# Patient Record
Sex: Male | Born: 1950 | Hispanic: No | Marital: Single | Smoking: Former smoker
Health system: Southern US, Community
[De-identification: ages and names within clinical notes are randomized; demographics above are authoritative.]

## PROBLEM LIST (undated history)

## (undated) ENCOUNTER — Ambulatory Visit: Admission: EM

## (undated) DIAGNOSIS — E7800 Pure hypercholesterolemia, unspecified: Secondary | ICD-10-CM

## (undated) DIAGNOSIS — R3911 Hesitancy of micturition: Secondary | ICD-10-CM

## (undated) DIAGNOSIS — L02619 Cutaneous abscess of unspecified foot: Secondary | ICD-10-CM

## (undated) DIAGNOSIS — H269 Unspecified cataract: Secondary | ICD-10-CM

## (undated) DIAGNOSIS — D649 Anemia, unspecified: Secondary | ICD-10-CM

## (undated) DIAGNOSIS — H9190 Unspecified hearing loss, unspecified ear: Secondary | ICD-10-CM

## (undated) DIAGNOSIS — M2550 Pain in unspecified joint: Secondary | ICD-10-CM

## (undated) DIAGNOSIS — M7989 Other specified soft tissue disorders: Secondary | ICD-10-CM

## (undated) DIAGNOSIS — R51 Headache: Secondary | ICD-10-CM

## (undated) DIAGNOSIS — R109 Unspecified abdominal pain: Secondary | ICD-10-CM

## (undated) DIAGNOSIS — M25562 Pain in left knee: Secondary | ICD-10-CM

## (undated) DIAGNOSIS — M25519 Pain in unspecified shoulder: Secondary | ICD-10-CM

## (undated) DIAGNOSIS — D126 Benign neoplasm of colon, unspecified: Secondary | ICD-10-CM

## (undated) DIAGNOSIS — R609 Edema, unspecified: Secondary | ICD-10-CM

## (undated) DIAGNOSIS — E119 Type 2 diabetes mellitus without complications: Secondary | ICD-10-CM

## (undated) DIAGNOSIS — I2510 Atherosclerotic heart disease of native coronary artery without angina pectoris: Secondary | ICD-10-CM

## (undated) DIAGNOSIS — I10 Essential (primary) hypertension: Secondary | ICD-10-CM

## (undated) DIAGNOSIS — L03119 Cellulitis of unspecified part of limb: Secondary | ICD-10-CM

## (undated) DIAGNOSIS — K219 Gastro-esophageal reflux disease without esophagitis: Secondary | ICD-10-CM

## (undated) DIAGNOSIS — N401 Enlarged prostate with lower urinary tract symptoms: Secondary | ICD-10-CM

## (undated) DIAGNOSIS — R05 Cough: Secondary | ICD-10-CM

## (undated) DIAGNOSIS — R972 Elevated prostate specific antigen [PSA]: Secondary | ICD-10-CM

## (undated) DIAGNOSIS — L97519 Non-pressure chronic ulcer of other part of right foot with unspecified severity: Secondary | ICD-10-CM

## (undated) DIAGNOSIS — H547 Unspecified visual loss: Secondary | ICD-10-CM

## (undated) DIAGNOSIS — L409 Psoriasis, unspecified: Secondary | ICD-10-CM

## (undated) HISTORY — PX: CARDIAC CATHETERIZATION: SHX172

## (undated) HISTORY — DX: Gastro-esophageal reflux disease without esophagitis: K21.9

## (undated) HISTORY — DX: Cough: R05

## (undated) HISTORY — DX: Benign prostatic hyperplasia with lower urinary tract symptoms: N40.1

## (undated) HISTORY — DX: Cutaneous abscess of unspecified foot: L02.619

## (undated) HISTORY — DX: Edema, unspecified: R60.9

## (undated) HISTORY — DX: Unspecified abdominal pain: R10.9

## (undated) HISTORY — DX: Unspecified cataract: H26.9

## (undated) HISTORY — DX: Essential (primary) hypertension: I10

## (undated) HISTORY — DX: Psoriasis, unspecified: L40.9

## (undated) HISTORY — DX: Pure hypercholesterolemia, unspecified: E78.00

## (undated) HISTORY — DX: Cellulitis of unspecified part of limb: L03.119

## (undated) HISTORY — DX: Benign neoplasm of colon, unspecified: D12.6

## (undated) HISTORY — DX: Other specified soft tissue disorders: M79.89

## (undated) HISTORY — DX: Pain in left knee: M25.562

## (undated) HISTORY — DX: Pain in unspecified joint: M25.50

## (undated) HISTORY — DX: Elevated prostate specific antigen (PSA): R97.20

## (undated) HISTORY — DX: Unspecified visual loss: H54.7

## (undated) HISTORY — DX: Type 2 diabetes mellitus without complications: E11.9

## (undated) HISTORY — PX: COLONOSCOPY: SHX174

## (undated) HISTORY — DX: Hesitancy of micturition: R39.11

## (undated) HISTORY — DX: Pain in unspecified shoulder: M25.519

## (undated) HISTORY — DX: Non-pressure chronic ulcer of other part of right foot with unspecified severity: L97.519

## (undated) HISTORY — DX: Headache: R51

---

## 2001-01-23 ENCOUNTER — Encounter (INDEPENDENT_AMBULATORY_CARE_PROVIDER_SITE_OTHER): Payer: Self-pay

## 2001-01-23 ENCOUNTER — Ambulatory Visit (HOSPITAL_COMMUNITY): Admission: RE | Admit: 2001-01-23 | Discharge: 2001-01-23 | Payer: Self-pay | Admitting: Gastroenterology

## 2002-11-06 ENCOUNTER — Encounter: Payer: Self-pay | Admitting: Family Medicine

## 2002-11-06 ENCOUNTER — Encounter: Admission: RE | Admit: 2002-11-06 | Discharge: 2002-11-06 | Payer: Self-pay | Admitting: Family Medicine

## 2003-05-30 HISTORY — PX: OTHER SURGICAL HISTORY: SHX169

## 2004-08-18 ENCOUNTER — Ambulatory Visit: Payer: Self-pay | Admitting: Endocrinology

## 2004-08-25 ENCOUNTER — Ambulatory Visit: Payer: Self-pay | Admitting: Endocrinology

## 2004-10-13 ENCOUNTER — Ambulatory Visit: Payer: Self-pay | Admitting: Endocrinology

## 2004-10-23 ENCOUNTER — Ambulatory Visit: Payer: Self-pay | Admitting: Endocrinology

## 2004-10-29 ENCOUNTER — Ambulatory Visit: Payer: Self-pay | Admitting: Endocrinology

## 2004-11-11 LAB — HM COLONOSCOPY

## 2005-06-25 ENCOUNTER — Ambulatory Visit: Payer: Self-pay | Admitting: Endocrinology

## 2005-07-29 ENCOUNTER — Ambulatory Visit: Payer: Self-pay | Admitting: Endocrinology

## 2005-10-21 ENCOUNTER — Ambulatory Visit: Payer: Self-pay | Admitting: Endocrinology

## 2005-11-04 ENCOUNTER — Ambulatory Visit: Payer: Self-pay | Admitting: Endocrinology

## 2006-02-04 ENCOUNTER — Ambulatory Visit: Payer: Self-pay | Admitting: Endocrinology

## 2006-02-08 ENCOUNTER — Ambulatory Visit: Payer: Self-pay | Admitting: Internal Medicine

## 2006-10-25 ENCOUNTER — Ambulatory Visit: Payer: Self-pay | Admitting: Endocrinology

## 2007-02-03 ENCOUNTER — Ambulatory Visit: Payer: Self-pay | Admitting: Endocrinology

## 2007-02-03 LAB — CONVERTED CEMR LAB
ALT: 15 units/L (ref 0–53)
Albumin: 3.9 g/dL (ref 3.5–5.2)
Alkaline Phosphatase: 49 units/L (ref 39–117)
BUN: 13 mg/dL (ref 6–23)
Basophils Absolute: 0 10*3/uL (ref 0.0–0.1)
Bilirubin Urine: NEGATIVE
CO2: 25 meq/L (ref 19–32)
Calcium: 9.6 mg/dL (ref 8.4–10.5)
Cholesterol: 256 mg/dL (ref 0–200)
Direct LDL: 124.1 mg/dL
Eosinophils Absolute: 0.1 10*3/uL (ref 0.0–0.6)
GFR calc Af Amer: 129 mL/min
GFR calc non Af Amer: 106 mL/min
HDL: 31.5 mg/dL — ABNORMAL LOW (ref >39.0)
Hemoglobin, Urine: NEGATIVE
Hemoglobin: 13.1 g/dL (ref 13.0–17.0)
Hgb A1c MFr Bld: 8.1 % — ABNORMAL HIGH (ref 4.6–6.0)
Leukocytes, UA: NEGATIVE
Lymphocytes Relative: 36.2 % (ref 12.0–46.0)
MCHC: 34.4 g/dL (ref 30.0–36.0)
MCV: 86.9 fL (ref 78.0–100.0)
Microalb Creat Ratio: 8 mg/g (ref 0.0–30.0)
Microalb, Ur: 1.2 mg/dL (ref 0.0–1.9)
Monocytes Absolute: 0.3 10*3/uL (ref 0.2–0.7)
Monocytes Relative: 7.1 % (ref 3.0–11.0)
Neutro Abs: 2.4 10*3/uL (ref 1.4–7.7)
Platelets: 210 10*3/uL (ref 150–400)
Potassium: 4.4 meq/L (ref 3.5–5.1)
Specific Gravity, Urine: >=1.03 (ref 1.000–1.03)
Total Protein, Urine: NEGATIVE mg/dL
Total Protein: 6.8 g/dL (ref 6.0–8.3)
Triglycerides: 582 mg/dL (ref 0–149)
Urine Glucose: 100 mg/dL — AB

## 2007-02-10 ENCOUNTER — Ambulatory Visit: Payer: Self-pay | Admitting: Endocrinology

## 2007-03-04 ENCOUNTER — Encounter: Payer: Self-pay | Admitting: Endocrinology

## 2007-03-04 DIAGNOSIS — I10 Essential (primary) hypertension: Secondary | ICD-10-CM | POA: Insufficient documentation

## 2007-03-04 DIAGNOSIS — E119 Type 2 diabetes mellitus without complications: Secondary | ICD-10-CM

## 2007-03-04 HISTORY — DX: Type 2 diabetes mellitus without complications: E11.9

## 2007-03-04 HISTORY — DX: Essential (primary) hypertension: I10

## 2007-05-04 ENCOUNTER — Ambulatory Visit: Payer: Self-pay | Admitting: Endocrinology

## 2007-05-04 LAB — CONVERTED CEMR LAB
AST: 21 units/L (ref 0–37)
Albumin: 3.9 g/dL (ref 3.5–5.2)
Alkaline Phosphatase: 37 units/L — ABNORMAL LOW (ref 39–117)
Hepatitis B Surface Ag: NEGATIVE
Hgb A1c MFr Bld: 7.9 % — ABNORMAL HIGH (ref 4.6–6.0)
Total CHOL/HDL Ratio: 4
Triglycerides: 85 mg/dL (ref 0–149)
VLDL: 17 mg/dL (ref 0–40)

## 2007-05-11 ENCOUNTER — Ambulatory Visit: Payer: Self-pay | Admitting: Endocrinology

## 2007-05-24 ENCOUNTER — Telehealth (INDEPENDENT_AMBULATORY_CARE_PROVIDER_SITE_OTHER): Payer: Self-pay | Admitting: *Deleted

## 2007-08-09 ENCOUNTER — Ambulatory Visit: Payer: Self-pay | Admitting: Endocrinology

## 2008-05-13 ENCOUNTER — Ambulatory Visit: Payer: Self-pay | Admitting: Endocrinology

## 2008-05-13 ENCOUNTER — Telehealth (INDEPENDENT_AMBULATORY_CARE_PROVIDER_SITE_OTHER): Payer: Self-pay | Admitting: *Deleted

## 2008-05-15 LAB — CONVERTED CEMR LAB
ALT: 18 units/L (ref 0–53)
Albumin: 4.3 g/dL (ref 3.5–5.2)
BUN: 18 mg/dL (ref 6–23)
Bilirubin Urine: NEGATIVE
CO2: 26 meq/L (ref 19–32)
Calcium: 9.4 mg/dL (ref 8.4–10.5)
Cholesterol: 276 mg/dL (ref 0–200)
Creatinine, Ser: 1 mg/dL (ref 0.4–1.5)
Creatinine,U: 197.7 mg/dL
Crystals: NEGATIVE
Eosinophils Absolute: 0.1 10*3/uL (ref 0.0–0.7)
Eosinophils Relative: 1.6 % (ref 0.0–5.0)
GFR calc Af Amer: 99 mL/min
Glucose, Bld: 292 mg/dL — ABNORMAL HIGH (ref 70–99)
HCT: 40.4 % (ref 39.0–52.0)
Hemoglobin, Urine: NEGATIVE
Hemoglobin: 14.3 g/dL (ref 13.0–17.0)
MCV: 87.5 fL (ref 78.0–100.0)
Microalb Creat Ratio: 20.2 mg/g (ref 0.0–30.0)
Microalb, Ur: 4 mg/dL — ABNORMAL HIGH (ref 0.0–1.9)
Monocytes Absolute: 0.4 10*3/uL (ref 0.1–1.0)
Monocytes Relative: 7.6 % (ref 3.0–12.0)
Neutro Abs: 3.7 10*3/uL (ref 1.4–7.7)
PSA: 2.02 ng/mL (ref 0.10–4.00)
Platelets: 181 10*3/uL (ref 150–400)
RDW: 12.2 % (ref 11.5–14.6)
Total CHOL/HDL Ratio: 7
Total Protein, Urine: NEGATIVE mg/dL
Total Protein: 7.5 g/dL (ref 6.0–8.3)
Triglycerides: 257 mg/dL (ref 0–149)
Urine Glucose: >=1000 mg/dL — CR
Urobilinogen, UA: 0.2 (ref 0.0–1.0)

## 2008-05-30 ENCOUNTER — Telehealth: Payer: Self-pay | Admitting: Endocrinology

## 2008-07-02 ENCOUNTER — Ambulatory Visit: Payer: Self-pay

## 2008-07-02 ENCOUNTER — Ambulatory Visit: Payer: Self-pay | Admitting: Endocrinology

## 2008-07-02 DIAGNOSIS — L02619 Cutaneous abscess of unspecified foot: Secondary | ICD-10-CM

## 2008-07-02 DIAGNOSIS — R609 Edema, unspecified: Secondary | ICD-10-CM

## 2008-07-02 DIAGNOSIS — L03119 Cellulitis of unspecified part of limb: Secondary | ICD-10-CM

## 2008-07-03 ENCOUNTER — Telehealth (INDEPENDENT_AMBULATORY_CARE_PROVIDER_SITE_OTHER): Payer: Self-pay | Admitting: *Deleted

## 2008-07-03 ENCOUNTER — Ambulatory Visit: Payer: Self-pay | Admitting: Endocrinology

## 2008-07-29 ENCOUNTER — Ambulatory Visit: Payer: Self-pay | Admitting: Endocrinology

## 2008-07-29 DIAGNOSIS — E7800 Pure hypercholesterolemia, unspecified: Secondary | ICD-10-CM | POA: Insufficient documentation

## 2008-07-29 DIAGNOSIS — D126 Benign neoplasm of colon, unspecified: Secondary | ICD-10-CM

## 2008-07-29 HISTORY — DX: Pure hypercholesterolemia, unspecified: E78.00

## 2008-07-29 HISTORY — DX: Benign neoplasm of colon, unspecified: D12.6

## 2008-08-20 ENCOUNTER — Telehealth: Payer: Self-pay | Admitting: Endocrinology

## 2009-12-04 ENCOUNTER — Telehealth: Payer: Self-pay | Admitting: Endocrinology

## 2009-12-04 ENCOUNTER — Ambulatory Visit: Payer: Self-pay | Admitting: Endocrinology

## 2009-12-04 LAB — CONVERTED CEMR LAB
Bilirubin, Direct: 0.1 mg/dL (ref 0.0–0.3)
CO2: 28 meq/L (ref 19–32)
Calcium: 10 mg/dL (ref 8.4–10.5)
Creatinine, Ser: 0.8 mg/dL (ref 0.4–1.5)
Total Bilirubin: 0.4 mg/dL (ref 0.3–1.2)
Total Protein: 7.6 g/dL (ref 6.0–8.3)

## 2009-12-05 ENCOUNTER — Telehealth: Payer: Self-pay | Admitting: Endocrinology

## 2009-12-09 ENCOUNTER — Telehealth: Payer: Self-pay | Admitting: Endocrinology

## 2009-12-31 ENCOUNTER — Telehealth (INDEPENDENT_AMBULATORY_CARE_PROVIDER_SITE_OTHER): Payer: Self-pay | Admitting: *Deleted

## 2010-01-12 ENCOUNTER — Ambulatory Visit: Payer: Self-pay | Admitting: Endocrinology

## 2010-01-12 DIAGNOSIS — M25519 Pain in unspecified shoulder: Secondary | ICD-10-CM

## 2010-01-12 HISTORY — DX: Pain in unspecified shoulder: M25.519

## 2010-01-12 LAB — CONVERTED CEMR LAB
ALT: 17 units/L (ref 0–53)
Albumin: 4.2 g/dL (ref 3.5–5.2)
BUN: 22 mg/dL (ref 6–23)
CO2: 26 meq/L (ref 19–32)
Calcium: 9.5 mg/dL (ref 8.4–10.5)
Creatinine, Ser: 0.7 mg/dL (ref 0.4–1.5)
Direct LDL: 128.7 mg/dL
HDL: 41 mg/dL (ref >39.00)
TSH: 0.56 microintl units/mL (ref 0.35–5.50)
Total Bilirubin: 0.6 mg/dL (ref 0.3–1.2)
Total Protein: 7.3 g/dL (ref 6.0–8.3)
Triglycerides: 518 mg/dL — ABNORMAL HIGH (ref 0.0–149.0)

## 2010-02-10 ENCOUNTER — Ambulatory Visit: Payer: Self-pay | Admitting: Endocrinology

## 2010-02-11 ENCOUNTER — Telehealth: Payer: Self-pay | Admitting: Endocrinology

## 2010-03-16 ENCOUNTER — Ambulatory Visit: Payer: Self-pay | Admitting: Endocrinology

## 2010-03-31 ENCOUNTER — Telehealth: Payer: Self-pay | Admitting: Endocrinology

## 2010-04-15 ENCOUNTER — Ambulatory Visit: Payer: Self-pay | Admitting: Endocrinology

## 2010-04-24 ENCOUNTER — Emergency Department (HOSPITAL_COMMUNITY): Admission: EM | Admit: 2010-04-24 | Discharge: 2010-04-24 | Payer: Self-pay | Admitting: Emergency Medicine

## 2010-05-15 ENCOUNTER — Ambulatory Visit: Payer: Self-pay | Admitting: Endocrinology

## 2010-05-21 ENCOUNTER — Encounter
Admission: RE | Admit: 2010-05-21 | Discharge: 2010-07-15 | Payer: Self-pay | Source: Home / Self Care | Attending: Orthopedic Surgery | Admitting: Orthopedic Surgery

## 2010-07-10 ENCOUNTER — Ambulatory Visit: Payer: Self-pay | Admitting: Endocrinology

## 2010-08-23 LAB — CONVERTED CEMR LAB
Alkaline Phosphatase: 61 units/L (ref 39–117)
Basophils Absolute: 0 10*3/uL (ref 0.0–0.1)
Bilirubin, Direct: 0.1 mg/dL (ref 0.0–0.3)
Crystals: NEGATIVE
Eosinophils Absolute: 0.1 10*3/uL (ref 0.0–0.7)
Eosinophils Relative: 1.6 % (ref 0.0–5.0)
GFR calc Af Amer: 149 mL/min
GFR calc non Af Amer: 123 mL/min
HDL: 44.2 mg/dL (ref >39.0)
Hgb A1c MFr Bld: 9.1 % — ABNORMAL HIGH (ref 4.6–6.0)
Leukocytes, UA: NEGATIVE
Lymphocytes Relative: 37.4 % (ref 12.0–46.0)
MCV: 85.9 fL (ref 78.0–100.0)
Microalb Creat Ratio: 15.3 mg/g (ref 0.0–30.0)
Neutrophils Relative %: 54.1 % (ref 43.0–77.0)
PSA: 3.41 ng/mL (ref 0.10–4.00)
Platelets: 192 10*3/uL (ref 150–400)
Potassium: 4.7 meq/L (ref 3.5–5.1)
Sodium: 139 meq/L (ref 135–145)
Specific Gravity, Urine: >=1.03 (ref 1.000–1.03)
TSH: 1.13 microintl units/mL (ref 0.35–5.50)
Total Bilirubin: 0.6 mg/dL (ref 0.3–1.2)
Total CHOL/HDL Ratio: 6
Triglycerides: 191 mg/dL — ABNORMAL HIGH (ref 0–149)
Urine Glucose: 250 mg/dL — AB
Urobilinogen, UA: 0.2 (ref 0.0–1.0)
VLDL: 38 mg/dL (ref 0–40)
WBC: 4.8 10*3/uL (ref 4.5–10.5)
pH: 5 (ref 5.0–8.0)

## 2010-08-27 NOTE — Assessment & Plan Note (Signed)
Summary: 1 MTH FU---STC   Vital Signs:  Patient profile:   60 year old male Height:      71 inches (180.34 cm) Weight:      211.13 pounds (95.97 kg) BMI:     29.55 O2 Sat:      95 % on Room air Temp:     98.5 degrees F (36.94 degrees C) oral Pulse rate:   85 / minute BP sitting:   124 / 72  (left arm) Cuff size:   regular  Vitals Entered By: Brenton Grills MA (April 15, 2010 8:24 AM)  O2 Flow:  Room air CC: 1 month F/U/aj Is Patient Diabetic? Yes   CC:  1 month F/U/aj.  History of Present Illness: no cbg record, but states cbg's vary from 102-450.  it is in general lowest in the afternoon, but he can't be certain.    Current Medications (verified): 1)  Aspirin 325 Mg  Tbec (Aspirin) .... Take 1 By Mouth Qd 2)  Lotrisone 1-0.05 % Crea (Clotrimazole-Betamethasone) .... Bid 3)  Simvastatin 80 Mg Tabs (Simvastatin) .... At Bedtime 4)  Humulin 70/30 70-30 % Susp (Insulin Isophane & Regular) .... 90 Units Each Am, and 30 Units With The Evening Meal. 5)  Tramadol Hcl 50 Mg Tabs (Tramadol Hcl) .[name].. 1 Wvery 4 Hrs As Needed For Pain 6)  Lisinopril 20 Mg Tabs (Lisinopril) .[name].. 1 Once Daily  Allergies (verified): No Known Drug Allergies  Past History:  Past Medical History: Last updated: 03/04/2007 Diabetes mellitus, type II Hypertension Psoriasis Smoker (Quit 1990) Dyslipidemia  Review of Systems  The patient denies syncope.    Physical Exam  General:  obese.  no distress  Pulses:  dorsalis pedis intact bilat.   Extremities:  no deformity.  no ulcer on the feet.  feet are of normal color and temp.  no edema mycotic toenails.   Neurologic:  sensation is intact to touch on the feet    Impression & Recommendations:  Problem # 1:  DIABETES MELLITUS, TYPE II (ICD-250.00) i need more cbg info in order to safely adjust insulin therapy limited by pt's request for least expensive meds therapy limited by noncompliance with cbg recording.  i'll do the best i  can.  Other Orders: Est. Patient Level II ([address]109)  Patient Instructions: 1)  for now, continue humulin ([facility]) 70/30 insulin, to 90 units with breakfast and 30 units with the evening meal.   2)  Please schedule a follow-up appointment in 1 month. 3)   check your blood sugar 1 time a day.  vary the time of day when you check, between before the 3 meals, and at bedtime.  also check if you have symptoms of your blood sugar being too high or too low.  please keep a record of the readings and bring it to your next appointment here.  please call us sooner if you are having low blood sugar episodes.

## 2010-08-27 NOTE — Progress Notes (Signed)
Summary: elevated CBGs  Phone Note Call from Patient Call back at Home Phone [phone]   Caller: Daughter Lockie Pares Summary of Call: pt's daughter called stating that pt's CBGs have still been very elevated at 340-500+. Pt's CBG this morning was 400+. Pt's daughter report dietary compliance, she also states pt has had a severe HA x 3 days. Pt and daughter are very concerned about pt's blood sugars and are requesting insulin adjustment and advise on how to better control sugars. Initial call taken by: Margaret Pyle, CMA,  Dec 09, 2009 9:03 AM  Follow-up for Phone Call        increase insulin to 60 units each am.  you can take an extra 20 units today, for a total of 60. Follow-up by: Minus Breeding MD,  Dec 09, 2009 9:09 AM  Additional Follow-up for Phone Call Additional follow up Details #1::        Pt's daughter informed of increase Additional Follow-up by: Margaret Pyle, CMA,  Dec 09, 2009 9:15 AM    New/Updated Medications: HUMULIN 70/30 70-30 % SUSP (INSULIN ISOPHANE & REGULAR) 60 units each am

## 2010-08-27 NOTE — Progress Notes (Signed)
Summary: elevated CBGS  Phone Note Call from Patient Call back at Home Phone [phone]   Caller:  Daughter Lockie Pares  Summary of Call: Patient daughter called stating that pt took 10 units of insulin last night and another 15units this am due to high CBG. Daughter says that CBG are so high that monitor will not register. She is very concerned and requesting MD advisment on how to get it lower. OK to lm on her vm or can call pt at [phone] Initial call taken by: Rock Nephew CMA,  Dec 05, 2009 2:47 PM  Follow-up for Phone Call        increase to 40 units each am.  ok to take 10 extra today. Follow-up by: Minus Breeding MD,  Dec 05, 2009 2:56 PM  Additional Follow-up for Phone Call Additional follow up Details #1::        Called daughter back did not ansew. Left msg on vm md recommendations. Allso tried to call pt but could not leave msg vm not set-up Additional Follow-up by: Orlan Leavens,  Dec 05, 2009 3:15 PM    New/Updated Medications: HUMULIN 70/30 70-30 % SUSP (INSULIN ISOPHANE & REGULAR) 40 units each am

## 2010-08-27 NOTE — Assessment & Plan Note (Signed)
Summary: elev sugar   Vital Signs:  Patient profile:   60 year old male Height:      71 inches (180.34 cm) Weight:      194 pounds (88.18 kg) BMI:     27.16 O2 Sat:      95 % on Room air Temp:     98.9 degrees F (37.17 degrees C) oral Pulse rate:   82 / minute BP sitting:   140 / 80  (left arm) Cuff size:   regular  Vitals Entered By: Josph Macho RMA (Dec 04, 2009 1:34 PM)  O2 Flow:  Room air CC: Elevated Blood Sugar- BS been running 550 or higher X7 days- this morning at 11:00 it was 350/ pt states he is not taking Lotrisone, Simvastatin, or Lisinopril/ CF Is Patient Diabetic? Yes   CC:  Elevated Blood Sugar- BS been running 550 or higher X7 days- this morning at 11:00 it was 350/ pt states he is not taking Lotrisone, Simvastatin, and or Lisinopril/ CF.  History of Present Illness: here with dtr today, who says 1 week ago, cbg's increased to 500.  he has 1 week of polydipsia, but no numbness of the feet.  denies loc.  he is still on all 3 dm meds. he stopped zestril.  he has a slight headahce. he stopped zocor.  he says his diet is "not good." he lost his health insurance, and wants to minimize his expenditures.  Current Medications (verified): 1)  Aspirin 325 Mg  Tbec (Aspirin) .... Take 1 By Mouth Qd 2)  Actos 45 Mg  Tabs (Pioglitazone Hcl) .... Take 1 By Mouth Qd 3)  Glyburide 5 Mg Tabs (Glyburide) .... Take 1 Tablet By Mouth Twice A Day 4)  Januvia 100 Mg  Tabs (Sitagliptin Phosphate) .... Take 1 By Mouth Qd 5)  Lotrisone 1-0.05 % Crea (Clotrimazole-Betamethasone) .... Bid 6)  Metformin Hcl 500 Mg Tabs (Metformin Hcl) .... Take 1 Two Times A Day 7)  Lisinopril-Hydrochlorothiazide 10-12.5 Mg Tabs (Lisinopril-Hydrochlorothiazide) .... 1/2 Qd 8)  Simvastatin 80 Mg Tabs (Simvastatin) .... Qhs  Allergies (verified): No Known Drug Allergies  Past History:  Past Medical History: Last updated: 03/04/2007 Diabetes mellitus, type II Hypertension Psoriasis Smoker (Quit  1990) Dyslipidemia  Review of Systems  The patient denies dyspnea on exertion.         denies n/v  Physical Exam  General:  normal appearance.   Msk:  gait is normal and steady Pulses:  dorsalis pedis intact bilat.   Extremities:  no deformity.  no ulcer on the feet.  feet are of normal color and temp.  no edema  Neurologic:  sensation is intact to touch on the feet    Impression & Recommendations:  Problem # 1:  DIABETES MELLITUS, TYPE II (ICD-250.00) therapy limited by pt's request for least expensive meds  Problem # 2:  HYPERTENSION (ICD-401.9) therapy limited by pt's request for least expensive meds  Problem # 3:  HYPERCHOLESTEROLEMIA (ICD-272.0) therapy limited by pt's request for least expensive meds  Medications Added to Medication List This Visit: 1)  Lisinopril-hydrochlorothiazide 10-12.5 Mg Tabs (Lisinopril-hydrochlorothiazide) .... 1/2 once daily 2)  Simvastatin 80 Mg Tabs (Simvastatin) .... At bedtime 3)  Humulin 70/30 70-30 % Susp (Insulin isophane & regular) .[name].. 10 units each am  Other Orders: TLB-BMP (Basic Metabolic Panel-BMET) (80048-METABOL) TLB-Hepatic/Liver Function Pnl (80076-HEPATIC) Est. Patient Level II (24401)  Patient Instructions: 1)  stop actos, metformin, glyburide, and januvia. 2)  start humulin ([facility]) 70/30 insulin, at  10 units each am.  every few days, increase by 5 units, until blood sugar at some time of day is in the 100's. 3)  Please schedule a follow-up appointment in 1 month. 4)  resume lisinopril-hctz.  i [facility] a [facility] to [facility]. 5)  resume simvastatin 80 mg at bedtime.  i sent this to [facility]. Prescriptions: SIMVASTATIN 80 MG TABS (SIMVASTATIN) at bedtime  #90 x 3   Entered and Authorized by:   Minus Breeding MD   Signed by:   Minus Breeding MD on 12/04/2009   Method used:   Electronically to        [facility] [address] (retail)       [address]       [place], [place]  [address]       Ph: [phone] or [phone]       Fax: [phone]   RxID:   [id number] LISINOPRIL-HYDROCHLOROTHIAZIDE 10-12.5 MG TABS (LISINOPRIL-HYDROCHLOROTHIAZIDE) 1/2 once daily  #30 x 11   Entered and Authorized by:   Minus Breeding MD   Signed by:   Minus Breeding MD on 12/04/2009   Method used:   Electronically to        [facility] (retail)       [address]       [place], [place]  [address]       Ph: [phone]       Fax: [phone]   RxID:   [id number] HUMULIN 70/30 70-30 % SUSP (INSULIN ISOPHANE & REGULAR) 10 units each am  #1 vial x 11   Entered and Authorized by:   Minus Breeding MD   Signed by:   Minus Breeding MD on 12/04/2009   Method used:   Electronically to        [facility] (retail)       [address]       [place], [place]  [address]       Ph: [phone]       Fax: [phone]   RxID:   [id number]

## 2010-08-27 NOTE — Assessment & Plan Note (Signed)
Summary: f/u appt/#/cd   Vital Signs:  Patient profile:   60 year old male Height:      71 inches (180.34 cm) Weight:      213.75 pounds (97.16 kg) BMI:     29.92 O2 Sat:      95 % on Room air Temp:     98.3 degrees F (36.83 degrees C) oral Pulse rate:   71 / minute BP sitting:   122 / 76  (left arm) Cuff size:   regular  Vitals Entered By: Brenton Grills MA (May 15, 2010 8:34 AM)  O2 Flow:  Room air CC: Follow-up visit/aj Is Patient Diabetic? Yes   CC:  Follow-up visit/aj.  History of Present Illness: he brings a record of his cbg's which i have reviewed today.  it varies from 200-300.   right hand pain persists.  tramadol does not help.  vicodin given in er helped.    Current Medications (verified): 1)  Aspirin 325 Mg  Tbec (Aspirin) .... Take 1 By Mouth Qd 2)  Lotrisone 1-0.05 % Crea (Clotrimazole-Betamethasone) .... Bid 3)  Simvastatin 80 Mg Tabs (Simvastatin) .... At Bedtime 4)  Humulin 70/30 70-30 % Susp (Insulin Isophane & Regular) .... 90 Units Each Am, and 30 Units With The Evening Meal. 5)  Tramadol Hcl 50 Mg Tabs (Tramadol Hcl) .[name].. 1 Wvery 4 Hrs As Needed For Pain 6)  Lisinopril 20 Mg Tabs (Lisinopril) .[name].. 1 Once Daily  Allergies (verified): No Known Drug Allergies  Past History:  Past Medical History: Last updated: 03/04/2007 Diabetes mellitus, type II Hypertension Psoriasis Smoker (Quit 1990) Dyslipidemia  Review of Systems  The patient denies hypoglycemia.    Physical Exam  General:  normal appearance.   Msk:  right hand swelling is unchanged Psych:  Alert and cooperative; normal mood and affect; normal attention span and concentration.     Impression & Recommendations:  Problem # 1:  DIABETES MELLITUS, TYPE II (ICD-250.00) needs increased rx  Problem # 2:  right hand pain  Medications Added to Medication List This Visit: 1)  Humulin 70/30 70-30 % Susp (Insulin isophane & regular) .[name].. 100 units each am, and 40 units with the  evening meal. 2)  Vicodin 5-500 Mg Tabs (Hydrocodone-acetaminophen) .[name].. 1 tab every 4 hrs as needed for pain  Other Orders: Admin 1st Vaccine (91478) Flu Vaccine 67yrs + (29562) TLB-Lipid Panel (80061-LIPID) TLB-A1C / Hgb A1C (Glycohemoglobin) (83036-A1C) Est. Patient Level II (13086)  Patient Instructions: 1)  increase humulin ([facility]) 70/30 insulin to, to 100 units with breakfast and 40 units with the evening meal.   2)  Please schedule a follow-up appointment in 2 months. 3)  check your blood sugar 1 time a day.  vary the time of day when you check, between before the 3 meals, and at bedtime.  also check if you have symptoms of your blood sugar being too high or too low.  please keep a record of the readings and bring it to your next appointment here.  please call us sooner if you are having low blood sugar episodes. 4)  here is a prescription for vicodin, for the hand pain.  please see the specialist as scheduled.   Prescriptions: VICODIN 5-500 MG TABS (HYDROCODONE-ACETAMINOPHEN) 1 tab every 4 hrs as needed for pain  #50 x 2   Entered and Authorized by:   Minus Breeding MD   Signed by:   Minus Breeding MD on 05/15/2010   Method used:   Print  then Give to Patient   RxID:   [id number]    Orders Added: 1)  Admin 1st Vaccine [90471] 2)  Flu Vaccine 20yrs + [56213] 3)  TLB-Lipid Panel [80061-LIPID] 4)  TLB-A1C / Hgb A1C (Glycohemoglobin) [83036-A1C] 5)  Est. Patient Level II [08657]     Flu Vaccine Consent Questions     Do you have a history of severe allergic reactions to this vaccine? no    Any prior history of allergic reactions to egg and/or gelatin? no    Do you have a sensitivity to the preservative Thimersol? no    Do you have a past history of Guillan-Barre Syndrome? no    Do you currently have an acute febrile illness? no    Have you ever had a severe reaction to latex? no    Vaccine information given and explained to patient? yes    Are you currently  pregnant? no    Lot Number:[id number]   Exp Date:01/23/2011   Site Given  Left Deltoid [id number]

## 2010-08-27 NOTE — Progress Notes (Signed)
Summary: Insulin?  Phone Note Call from Patient Call back at Home Phone [phone]   Caller: Daughter Lockie Pares  Summary of Call: Pt's daughter called to ask MD if pt should start his new Insulin today because his CBGs are elevated and then continue with regular dosage starting tomorrow morning? Pt did have his morning dosage of oral DM medications today. Initial call taken by: Margaret Pyle, CMA,  Dec 04, 2009 3:34 PM  Follow-up for Phone Call        that would be fine Follow-up by: Minus Breeding MD,  Dec 04, 2009 3:40 PM  Additional Follow-up for Phone Call Additional follow up Details #1::        pt's daughter informed Additional Follow-up by: Margaret Pyle, CMA,  Dec 04, 2009 3:57 PM

## 2010-08-27 NOTE — Assessment & Plan Note (Signed)
Summary: 2 month follow up-lb   Vital Signs:  Patient profile:   60 year old male Height:      71 inches (180.34 cm) Weight:      207.50 pounds (94.32 kg) BMI:     29.04 O2 Sat:      96 % on Room air Temp:     97.1 degrees F (36.17 degrees C) oral Pulse rate:   72 / minute BP sitting:   130 / 74  (left arm) Cuff size:   regular  Vitals Entered By: Brenton Grills MA (March 16, 2010 7:57 AM)  O2 Flow:  Room air CC: 2 month F/U/aj Is Patient Diabetic? Yes   CC:  2 month F/U/aj.  History of Present Illness: pt states he feels well in general.  no cbg record, but states cbg's vary from 250-350.  it is in general higher later in the day.   pt says right hand swelling persists.  no assoc numbness or weakness  Current Medications (verified): 1)  Aspirin 325 Mg  Tbec (Aspirin) .... Take 1 By Mouth Qd 2)  Lotrisone 1-0.05 % Crea (Clotrimazole-Betamethasone) .... Bid 3)  Simvastatin 80 Mg Tabs (Simvastatin) .... At Bedtime 4)  Humulin 70/30 70-30 % Susp (Insulin Isophane & Regular) .... 70 Units Each Am, and 20 Units With The Evening Meal. 5)  Tramadol Hcl 50 Mg Tabs (Tramadol Hcl) .[name].. 1 Wvery 4 Hrs As Needed For Pain 6)  Lisinopril 20 Mg Tabs (Lisinopril) .[name].. 1 Once Daily 7)  Ondansetron Hcl 4 Mg Tabs (Ondansetron Hcl) .[name].. 1 Tab Every 4 Hrs As Needed For Nausea  Allergies (verified): No Known Drug Allergies  Past History:  Past Medical History: Last updated: 03/04/2007 Diabetes mellitus, type II Hypertension Psoriasis Smoker (Quit 1990) Dyslipidemia  Review of Systems  The patient denies hypoglycemia.         no pain at the right hand  Physical Exam  General:  normal appearance.   Extremities:  right hand:  slightly more swollen than the left.  otherwise normal   Impression & Recommendations:  Problem # 1:  DIABETES MELLITUS, TYPE II (ICD-250.00) needs increased rx  Problem # 2:  hand swelling uncertain etiology  Problem # 3:  HYPERTENSION  (ICD-401.9)  Medications Added to Medication List This Visit: 1)  Humulin 70/30 70-30 % Susp (Insulin isophane & regular) .... 90 units each am, and 30 units with the evening meal.  Other Orders: Est. Patient Level III (95621)  Patient Instructions: 1)  increase humulin ([facility]) 70/30 insulin, to 90 units with breakfast and 30 units with the evening meal.   2)  Please schedule a follow-up appointment in 1 month. 3)  continue lisinopril 20 mg once daily. 4)  it would be expensive to investigate your right hand swelling.  however, i am happy to refer you to a hand specialist if you wish.

## 2010-08-27 NOTE — Progress Notes (Signed)
  Phone Note Other Incoming   Request: Send information Summary of Call: Request for records received from [facility]. Request forwarded to [facility].     

## 2010-08-27 NOTE — Assessment & Plan Note (Signed)
Summary: 1 mth fu  stc   Vital Signs:  Patient profile:   60 year old male Weight:      205 pounds BMI:     28.70 Temp:     97.9 degrees F Pulse rate:   76 / minute BP sitting:   158 / 78  (left arm)  Vitals Entered By: Lamar Sprinkles, CMA (January 12, 2010 9:20 AM) CC: F/u, cbgs have decreased slightly but continues to see elevated numbers. C/o pain & swelling in right hand. Continued migraines, only slight improvment since last ov.  Comments Needs RF of  Lotrisone cream   CC:  F/u, cbgs have decreased slightly but continues to see elevated numbers. C/o pain & swelling in right hand. Continued migraines, and only slight improvment since last ov. .  History of Present Illness: despite increasing his insulin to 60 units each am, cbg's are still 200-300.  he says it is in general higher as the day goes on.   pt has few mos of pain at the left shoulder > right, and associated swelling at the right hand.    Current Medications (verified): 1)  Aspirin 325 Mg  Tbec (Aspirin) .... Take 1 By Mouth Qd 2)  Lotrisone 1-0.05 % Crea (Clotrimazole-Betamethasone) .... Bid 3)  Lisinopril-Hydrochlorothiazide 10-12.5 Mg Tabs (Lisinopril-Hydrochlorothiazide) .... 1/2 Once Daily 4)  Simvastatin 80 Mg Tabs (Simvastatin) .... At Bedtime 5)  Humulin 70/30 70-30 % Susp (Insulin Isophane & Regular) .... 60 Units Each Am  Allergies (verified): No Known Drug Allergies  Past History:  Past Medical History: Last updated: 03/04/2007 Diabetes mellitus, type II Hypertension Psoriasis Smoker (Quit 1990) Dyslipidemia  Review of Systems  The patient denies hypoglycemia.         slight numbness at the left arm.  Physical Exam  General:  normal appearance.   Extremities:  full rom of the ue's, but rom is painful.both hands are slightly swollen Additional Exam:  Cholesterol LDL - Direct             128.7 mg/dL   Impression & Recommendations:  Problem # 1:  DIABETES MELLITUS, TYPE II  (ICD-250.00) needs increased rx  Problem # 2:  SHOULDER PAIN, BILATERAL (ICD-719.41) uncertain etiology  Problem # 3:  HYPERCHOLESTEROLEMIA (ICD-272.0) needs increased rx  Medications Added to Medication List This Visit: 1)  Humulin 70/30 70-30 % Susp (Insulin isophane & regular) .... 80 units each am 2)  Tramadol Hcl 50 Mg Tabs (Tramadol hcl) .Marland KitchenMarland KitchenMarland Kitchen 1 wvery 4 hrs as needed for pain  Other Orders: TLB-Lipid Panel (80061-LIPID) TLB-TSH (Thyroid Stimulating Hormone) (84443-TSH) TLB-BMP (Basic Metabolic Panel-BMET) (80048-METABOL) TLB-Sedimentation Rate (ESR) (85652-ESR) TLB-Hepatic/Liver Function Pnl (80076-HEPATIC) Est. Patient Level III (60454)  Patient Instructions: 1)  increase humulin ([facility]) 70/30 insulin, to 80 units each am.  every few days, increase by 5 units, until blood sugar at some time of day is in the low-100's. 2)  Please schedule a follow-up appointment in 2 months. 3)  tramadol 50 mg every 4 hrs as needed for pain 4)  blood tests are being ordered for you today.  please call [phone] to hear your test results. 5)  you should see an orthopedic specialist for this pain.  let me know if you wish to be referred. 6)  (update: i left message on phone-tree:  take zocor 80/d). Prescriptions: TRAMADOL HCL 50 MG TABS (TRAMADOL HCL) 1 wvery 4 hrs as needed for pain  #50 x 2   Entered and Authorized by:   Gregary Signs  Ardeen Garland MD   Signed by:   Minus Breeding MD on 01/12/2010   Method used:   Electronically to        [facility] (retail)       [address]       [place], [place]  [address]       Ph: [phone]       Fax: [phone]   RxID:   [id number]

## 2010-08-27 NOTE — Progress Notes (Signed)
Summary: Call Report  Phone Note Other Incoming   Caller: [facility] Call Report Summary of Call: [facility] Triage Call Report Triage Record Num: [id number] Operator: Remonia Richter Patient Name: Scott Donaldson Call Date & Time: 12/03/2009 6:50:24PM Patient Phone: [phone] PCP: Romero Belling Patient Gender: Male PCP Fax : [phone] Patient DOB: [birth year] Practice Name: [facility] Reason for Call: Daughter.Jaclyn : blood sugar at 540 all day and refuses to go to ER, asking what else she can do until am, dizzy and feeling poorly, ER disposition obtained and asked to encourage him to go for eval per diabetes guideline Protocol(s) Used: Diabetes: Out of Control Recommended Outcome per Protocol: See ED Immediately Reason for Outcome: Blood sugar more than 300 mg/dl AND signs/symptoms of ketoacidosis Care Advice:  ~ Another adult should drive. Write down provider's name. List or place the following in a bag for transport with the patient: current prescription and/or OTC medications; alternative treatments, therapies and medications; and street drugs.  ~  ~ If available, bring recent log of blood sugars or bring blood glucose monitor with log of blood sugars.  ~ Call EMS 911 if difficulty breathing, loses consciousness, confusion or fast pulse rate develops. Dehydration can affect blood sugar levels. Drink water during transport and while waiting to see a provider. If vomiting, take sips of water or suck on ice chips.  ~  ~ When sitting, use a chair with arms to help protect from falling.  ~ IMMEDIATE ACTION  ~ CAUTIONS 05/ Initial call taken by: Margaret Pyle, CMA,  Dec 04, 2009 8:14 AM  Follow-up for Phone Call        ov today please Follow-up by: Minus Breeding MD,  Dec 04, 2009 8:53 AM  Additional Follow-up for Phone Call Additional follow up Details #1::        per pt's daughter appt has been scheduled Additional Follow-up by: Margaret Pyle, CMA,  Dec 04, 2009 9:29 AM

## 2010-08-27 NOTE — Assessment & Plan Note (Signed)
Summary: Scott Donaldson, NAUSEA, HEADACHE, CAN'T SIT UP/NWS  #   Vital Signs:  Patient profile:   60 year old male Height:      71 inches (180.34 cm) Weight:      203 pounds (92.27 kg) BMI:     28.42 O2 Sat:      96 % on Room air Temp:     98.2 degrees F (36.78 degrees C) oral Pulse rate:   85 / minute BP sitting:   148 / 82  (right arm) Cuff size:   regular  Vitals Entered By: Brenton Grills MA (February 10, 2010 4:05 PM)  O2 Flow:  Room air CC: Pt c/o HA, chills and sweating, nausea, vomiting/Pt needs refills on Lotrisone Cream and maybe Humulin/aj Is Patient Diabetic? Yes   CC:  Pt c/o HA, chills and sweating, nausea, and vomiting/Pt needs refills on Lotrisone Cream and maybe Humulin/aj.  History of Present Illness: pt states 5 days of intermittent diaphoresis throughout the body, and associated n/v. sxs were worse when cbg went down to 120. dtr says she does not think he has an illness.  she thinks it is due to dm or ultram. no cbg record, but states cbg's vary from 124-300. it is in general higher in am than later in the day. dtr says it is hard for her to split the zestoretic.    Current Medications (verified): 1)  Aspirin 325 Mg  Tbec (Aspirin) .... Take 1 By Mouth Qd 2)  Lotrisone 1-0.05 % Crea (Clotrimazole-Betamethasone) .... Bid 3)  Lisinopril-Hydrochlorothiazide 10-12.5 Mg Tabs (Lisinopril-Hydrochlorothiazide) .... 1/2 Once Daily 4)  Simvastatin 80 Mg Tabs (Simvastatin) .... At Bedtime 5)  Humulin 70/30 70-30 % Susp (Insulin Isophane & Regular) .... 80 Units Each Am 6)  Tramadol Hcl 50 Mg Tabs (Tramadol Hcl) .[name].. 1 Wvery 4 Hrs As Needed For Pain  Allergies (verified): No Known Drug Allergies  Past History:  Past Medical History: Last updated: 03/04/2007 Diabetes mellitus, type II Hypertension Psoriasis Smoker (Quit 1990) Dyslipidemia  Review of Systems  The patient denies fever and abdominal pain.    Physical Exam  General:  normal appearance.   Abdomen:   abdomen is soft, nontender.  no hepatosplenomegaly.   not distended.  no hernia    Impression & Recommendations:  Problem # 1:  DIABETES MELLITUS, TYPE II (ICD-250.00) needs increased rx  Problem # 2:  HYPERTENSION (ICD-401.9) pt will need to take an entire tab for this.  Problem # 3:  nausea uncertain etiology  Medications Added to Medication List This Visit: 1)  Humulin 70/30 70-30 % Susp (Insulin isophane & regular) .... 70 units each am, and 20 units with the evening meal. 2)  Lisinopril 20 Mg Tabs (Lisinopril) .[name].. 1 once daily 3)  Zofran Odt 4 Mg Tbdp (Ondansetron) .[name].. 1 every 4 hrs as needed for nausea  Other Orders: Est. Patient Level III ([address])  Patient Instructions: 1)  increase humulin ([facility]) 70/30 insulin, to 70 units with breakfast and 20 units with the evening meal.   2)  Please schedule a follow-up appointment in 2 months. 3)  change lisinopril-hctz to plain lisinopril 20 mg once daily. 4)  continue prilosec 20 mg once daily 5)  ondansetron 4 mg every 4 hrs as needed for nausea. 6)  despite your lack of health insurance, you should have further testing of these symptoms.  let me know if you decide to do the tests. 7)  please return next month as scheduled. Prescriptions: ZOFRAN ODT 4  MG TBDP (ONDANSETRON) 1 every 4 hrs as needed for nausea  #36 x 2   Entered and Authorized by:   Minus Breeding MD   Signed by:   Minus Breeding MD on 02/10/2010   Method used:   Electronically to        [facility] [address] (retail)       [address]       [place], [place]  [address]       Ph: [phone] or [phone]       Fax: [phone]   RxID:   [id number] LOTRISONE 1-0.05 % CREA (CLOTRIMAZOLE-BETAMETHASONE) bid  #1 med tube x 2   Entered and Authorized by:   Minus Breeding MD   Signed by:   Minus Breeding MD on 02/10/2010   Method used:   Electronically to        [facility] (retail)       [address]       [place], [place]  [address]       Ph: [phone]       Fax: [phone]   RxID:   [id number] ZOFRAN ODT 4 MG TBDP (ONDANSETRON) 1 every 4 hrs as needed for nausea  #36 x 2   Entered and Authorized by:   Minus Breeding MD   Signed by:   Minus Breeding MD on 02/10/2010   Method used:   Electronically to        [facility] (retail)       [address]       [place], [place]  [address]       Ph: [phone]       Fax: [phone]   RxID:   [id number] LISINOPRIL 20 MG TABS (LISINOPRIL) 1 once daily  #30 x 11   Entered and Authorized by:   Minus Breeding MD   Signed by:   Minus Breeding MD on 02/10/2010   Method used:   Electronically to        [facility] (retail)       [address]       [place], [place]  [address]       Ph: [phone]       Fax: [phone]   RxID:   [id number]

## 2010-08-27 NOTE — Progress Notes (Signed)
Summary: Call Report  Phone Note Other Incoming   Caller: [facility] Summary of Call: [facility] Triage Call Report Triage Record Num: [id number] Operator: Loraine Leriche Neurohr Patient Name: Scott Donaldson Call Date & Time: 03/28/2010 11:43:08AM Patient Phone: [phone] PCP: Romero Belling Patient Gender: Male PCP Fax : [phone] Patient DOB: October 08, 1950 Practice Name: [facility] Reason for Call: Wt= 200 lbs, Question about medication for insulin pen. Father, who is the pt does not know how to use the new equipment. When this new appliance was issued, he either was not paying attention, or he forgot how to use it correcty.(according to the daughter). Daughter instructed to stay with the older/familiar dosing of the Insulin regimen and not change until they follow up with the office on Monday. Protocol(s) Used: Office Note Recommended Outcome per Protocol: Information Noted and Sent to Office Reason for Outcome: Caller information to office Care Advice:  ~ Initial call taken by: Margaret Pyle, CMA,  March 31, 2010 8:09 AM  Follow-up for Phone Call        please call patient: please hold-off on using the pen for now.  we'll discuss at next ov Follow-up by: Minus Breeding MD,  March 31, 2010 8:55 AM  Additional Follow-up for Phone Call Additional follow up Details #1::        Pt's daughter informed via VM, told to call back with any further questions or concerns Additional Follow-up by: Margaret Pyle, CMA,  March 31, 2010 9:20 AM

## 2010-08-27 NOTE — Progress Notes (Signed)
Summary: Generic?  Phone Note Call from Patient Call back at Home Phone [phone]   Caller: Daughter Summary of Call: Pt's daughter called stating that Zofran at [facility] is too expensive. Pt and daughter are requesting Rx for generic instead, please advise. Initial call taken by: Margaret Pyle, CMA,  February 11, 2010 10:37 AM  Follow-up for Phone Call        at [facility], 36 pills are $10.  please ask pharmacy why this is not $10. Follow-up by: Minus Breeding MD,  February 11, 2010 12:14 PM  Additional Follow-up for Phone Call Additional follow up Details #1::        Spoke with Gaya at [facility]--rx for Zofran ODT is more expensive than regular Zofran--regular Zofran is on generic list but not Zofran ODT Additional Follow-up by: Brenton Grills MA,  February 11, 2010 2:54 PM    Additional Follow-up for Phone Call Additional follow up Details #2::    i changed rx and sent Follow-up by: Minus Breeding MD,  February 11, 2010 5:35 PM  Additional Follow-up for Phone Call Additional follow up Details #3:: Details for Additional Follow-up Action Taken: Pt's daughter informed Additional Follow-up by: Lamar Sprinkles, CMA,  February 11, 2010 6:18 PM  New/Updated Medications: ONDANSETRON HCL 4 MG TABS (ONDANSETRON HCL) 1 tab every 4 hrs as needed for nausea Prescriptions: ONDANSETRON HCL 4 MG TABS (ONDANSETRON HCL) 1 tab every 4 hrs as needed for nausea  #36 x 1   Entered and Authorized by:   Minus Breeding MD   Signed by:   Minus Breeding MD on 02/11/2010   Method used:   Electronically to        [facility] [address] (retail)       [address]       [place], [place]  [address]       Ph: [phone] or [phone]       Fax: [phone]   RxID:   [id number]

## 2010-10-19 ENCOUNTER — Other Ambulatory Visit: Payer: Self-pay | Admitting: Endocrinology

## 2010-10-19 NOTE — Telephone Encounter (Signed)
No refill until next ov 

## 2010-10-19 NOTE — Telephone Encounter (Signed)
Refill for Hydrocodone-APAP-please advise

## 2010-11-12 ENCOUNTER — Ambulatory Visit (INDEPENDENT_AMBULATORY_CARE_PROVIDER_SITE_OTHER): Payer: Self-pay | Admitting: Endocrinology

## 2010-11-12 ENCOUNTER — Other Ambulatory Visit (INDEPENDENT_AMBULATORY_CARE_PROVIDER_SITE_OTHER): Payer: Self-pay

## 2010-11-12 ENCOUNTER — Encounter: Payer: Self-pay | Admitting: Endocrinology

## 2010-11-12 VITALS — BP 118/70 | HR 86 | Temp 98.4°F | Ht 72.0 in | Wt 215.4 lb

## 2010-11-12 DIAGNOSIS — M2550 Pain in unspecified joint: Secondary | ICD-10-CM

## 2010-11-12 DIAGNOSIS — E119 Type 2 diabetes mellitus without complications: Secondary | ICD-10-CM

## 2010-11-12 HISTORY — DX: Pain in unspecified joint: M25.50

## 2010-11-12 MED ORDER — HYDROCODONE-ACETAMINOPHEN 5-500 MG PO TABS: 1.0000 | ORAL_TABLET | ORAL | Status: DC | PRN

## 2010-11-12 NOTE — Patient Instructions (Addendum)
check your blood sugar 1 time a day.  vary the time of day when you check, between before the 3 meals, and at bedtime.  also check if you have symptoms of your blood sugar being too high or too low.  please keep a record of the readings and bring it to your next appointment here.  please call us sooner if you are having low blood sugar episodes.   blood tests are being ordered for you today.  please call [phone] to hear your test results. pending the test results, please continue the same medications for now.   Please make a follow-up appointment in 1 month (update: i left message on phone-tree:  Increase insulin to 120 units am and 60 pm)

## 2010-11-12 NOTE — Progress Notes (Signed)
  Subjective:    Patient ID: Scott Donaldson, male    DOB: August 23, 1950, 60 y.o.   MRN: [id number]  HPI no cbg record, but states cbg's vary from 114-400.  He is unaware of any trend throughout the day.  He says he gets hypoglycemic sxs when ever it goes below 200.  He says he sometimes mises his insulin, but this is less than 1/week. Pt states 1 year of moderate pain at the mcp's and pip's of both hands.  No assoc fever. Past Medical History  Diagnosis Date  . DIABETES MELLITUS, TYPE II 03/04/2007  . HYPERCHOLESTEROLEMIA 07/29/2008  . HYPERTENSION 03/04/2007  . Psoriasis    Past Surgical History  Procedure Date  . Stress cardiolite 05/30/2003    reports that he quit smoking about 22 years ago. He does not have any smokeless tobacco history on file. His alcohol and drug histories not on file. family history includes Cancer in his father and mother and Cancer (age of onset:50) in his brother. No Known Allergies  Review of Systems denies hypoglycemia and loc    Objective:   Physical Exam GENERAL: no distress Hands: there is slight swelling of the hands, of all joints.  No redness/tend/deformity Pulses: dorsalis pedis intact bilat.   Feet: no deformity.  no ulcer on the feet.  feet are of normal color and temp.  no edema. There is bilat onychomycosis Neuro: sensation is intact to touch on the feet       Lab Results  Component Value Date   HGBA1C 11.7* 11/12/2010      Assessment & Plan:  Dm, therapy limited by noncompliance, adn by cost factors.  i'll do the best i can. Arthralgias, uncertain etiology.  New problem

## 2010-12-14 ENCOUNTER — Ambulatory Visit (INDEPENDENT_AMBULATORY_CARE_PROVIDER_SITE_OTHER): Payer: Self-pay | Admitting: Endocrinology

## 2010-12-14 ENCOUNTER — Telehealth: Payer: Self-pay | Admitting: *Deleted

## 2010-12-14 ENCOUNTER — Encounter: Payer: Self-pay | Admitting: Endocrinology

## 2010-12-14 VITALS — BP 138/76 | HR 84 | Temp 98.1°F | Resp 16 | Wt 217.8 lb

## 2010-12-14 DIAGNOSIS — E119 Type 2 diabetes mellitus without complications: Secondary | ICD-10-CM

## 2010-12-14 MED ORDER — HYDROCODONE-ACETAMINOPHEN 5-500 MG PO TABS: 1.0000 | ORAL_TABLET | ORAL | Status: DC | PRN

## 2010-12-14 NOTE — Telephone Encounter (Signed)
i printed 

## 2010-12-14 NOTE — Patient Instructions (Addendum)
check your blood sugar 1 time a day.  vary the time of day when you check, between before the 3 meals, and at bedtime.  also check if you have symptoms of your blood sugar being too high or too low.  please keep a record of the readings and bring it to your next appointment here.  please call us sooner if you are having low blood sugar episodes.   blood tests are being ordered for you today.  please call [phone] to hear your test results. pending the test results, please continue the same medications for now.   Please make a follow-up appointment in 2 months. Increase insulin to 130 units with breakfast, and 60 with the evening meal.

## 2010-12-14 NOTE — Telephone Encounter (Signed)
Rx faxed to pharmacy  

## 2010-12-14 NOTE — Progress Notes (Signed)
  Subjective:    Patient ID: Scott Donaldson, male    DOB: [birth year], 60 y.o.   MRN: [id number]  HPI no cbg record, but states cbg's are in the high-100's.  He says it is lightly higher in the evening, than in the morning.  pt states he feels well in general.   Review of Systems denies hypoglycemia.      Objective:   Physical Exam GENERAL: no distress SKIN: Insulin injection sites at the anterior abdomen are normal    Lab Results  Component Value Date   HGBA1C 11.7* 11/12/2010     Assessment & Plan:  Dm, poor control

## 2010-12-14 NOTE — Telephone Encounter (Signed)
Request for refill for: Vicodin 5-50mg  Tab [last refill 11/12/10 #50x0]

## 2011-02-15 ENCOUNTER — Other Ambulatory Visit (INDEPENDENT_AMBULATORY_CARE_PROVIDER_SITE_OTHER): Payer: Self-pay

## 2011-02-15 ENCOUNTER — Ambulatory Visit (INDEPENDENT_AMBULATORY_CARE_PROVIDER_SITE_OTHER): Payer: Self-pay | Admitting: Endocrinology

## 2011-02-15 ENCOUNTER — Encounter: Payer: Self-pay | Admitting: Endocrinology

## 2011-02-15 ENCOUNTER — Telehealth: Payer: Self-pay | Admitting: Endocrinology

## 2011-02-15 VITALS — BP 122/80 | HR 89 | Temp 98.4°F | Ht 71.0 in | Wt 220.0 lb

## 2011-02-15 DIAGNOSIS — E119 Type 2 diabetes mellitus without complications: Secondary | ICD-10-CM

## 2011-02-15 MED ORDER — SIMVASTATIN 80 MG PO TABS: 80.0000 mg | ORAL_TABLET | Freq: Every day | ORAL | Status: DC

## 2011-02-15 NOTE — Telephone Encounter (Signed)
i left message on phone tree Increase insulin to 170 am and 60 pm

## 2011-02-15 NOTE — Patient Instructions (Addendum)
check your blood sugar 1 time a day.  vary the time of day when you check, between before the 3 meals, and at bedtime.  also check if you have symptoms of your blood sugar being too high or too low.  please keep a record of the readings and bring it to your next appointment here.  please call us sooner if you are having low blood sugar episodes.   blood tests are being ordered for you today.  please call [phone] to hear your test results. pending the test results, please increase insulin to 150 units with breakfast, and 60 with the evening meal. Please make a follow-up appointment in 3 months. You should resume your simvastatin.  i have sent a prescription to your pharmacy.

## 2011-02-15 NOTE — Progress Notes (Signed)
  Subjective:    Patient ID: Scott Donaldson, male    DOB: 09/12/1950, 60 y.o.   MRN: [id number]  HPI Pt takes 120 units qam, and 60 pm.  no cbg record, but states cbg's were high for a few days last week, while he was ill.  He says cbg's are in the 200's.  There is no trend throughout the day.  Since he recovered from his illness, pt states he feels well in general. He has not recently taken zocor.  Past Medical History  Diagnosis Date  . DIABETES MELLITUS, TYPE II 03/04/2007  . HYPERCHOLESTEROLEMIA 07/29/2008  . HYPERTENSION 03/04/2007  . Psoriasis     Past Surgical History  Procedure Date  . Stress cardiolite 05/30/2003    History   Social History  . Marital Status: Single    Spouse Name: N/A    Number of Children: N/A  . Years of Education: N/A   Occupational History  . Not on file.   Social History Main Topics  . Smoking status: Former Smoker    Quit date: 07/26/1988  . Smokeless tobacco: Not on file  . Alcohol Use: Not on file  . Drug Use: Not on file  . Sexually Active: Not on file   Other Topics Concern  . Not on file   Social History Narrative  . No narrative on file   Current Outpatient Prescriptions on File Prior to Visit  Medication Sig Dispense Refill  . aspirin 325 MG tablet Take 325 mg by mouth daily.        . clotrimazole-betamethasone (LOTRISONE) cream Apply topically 2 (two) times daily.        [name] HYDROcodone-acetaminophen (VICODIN) 5-500 MG per tablet Take 1 tablet by mouth every 4 (four) hours as needed. For pain  50 tablet  2  . insulin NPH-insulin regular (NOVOLIN 70/30) (70-30) 100 UNIT/ML injection 150 units every morning and 60 units with the evening meal      . lisinopril (PRINIVIL,ZESTRIL) 20 MG tablet Take 20 mg by mouth daily.        . simvastatin (ZOCOR) 80 MG tablet Take 80 mg by mouth at bedtime.         No Known Allergies  Family History  Problem Relation Age of Onset  . Cancer Mother     Breast Cancer  . Cancer Father    uncertain type  . Cancer Brother 50    Colon Cancer   BP 122/80  Pulse 89  Temp(Src) 98.4 F (36.9 C) (Oral)  Ht 5\' 11"  (1.803 m)  Wt 220 lb (99.791 kg)  BMI 30.68 kg/m2  SpO2 95%  Review of Systems denies hypoglycemia      Objective:   Physical Exam Pulses: dorsalis pedis intact bilat.   Feet: no deformity.  no ulcer on the feet.  feet are of normal color and temp.  no edema Neuro: sensation is intact to touch on the feet    Assessment & Plan:  Dm, needs increased rx Dyslipidemia, therapy limited by noncompliance.  i'll do the best i can.

## 2011-05-17 ENCOUNTER — Ambulatory Visit (INDEPENDENT_AMBULATORY_CARE_PROVIDER_SITE_OTHER): Payer: Self-pay | Admitting: Endocrinology

## 2011-05-17 ENCOUNTER — Encounter: Payer: Self-pay | Admitting: Endocrinology

## 2011-05-17 ENCOUNTER — Other Ambulatory Visit (INDEPENDENT_AMBULATORY_CARE_PROVIDER_SITE_OTHER): Payer: Self-pay

## 2011-05-17 VITALS — BP 148/82 | HR 75 | Temp 98.1°F | Ht 71.0 in | Wt 222.0 lb

## 2011-05-17 DIAGNOSIS — E119 Type 2 diabetes mellitus without complications: Secondary | ICD-10-CM

## 2011-05-17 DIAGNOSIS — Z23 Encounter for immunization: Secondary | ICD-10-CM

## 2011-05-17 LAB — HEMOGLOBIN A1C: Hgb A1c MFr Bld: 8.7 % — ABNORMAL HIGH (ref 4.6–6.5)

## 2011-05-17 NOTE — Progress Notes (Signed)
  Subjective:    Patient ID: Scott Donaldson, male    DOB: [birth year], 60 y.o.   MRN: [id number]  HPI no cbg record, but states cbg's are well-controlled.  He has had hypoglycemia, during the day.  He is unable to cite any precip factor.   Past Medical History  Diagnosis Date  . DIABETES MELLITUS, TYPE II 03/04/2007  . HYPERCHOLESTEROLEMIA 07/29/2008  . HYPERTENSION 03/04/2007  . Psoriasis     Past Surgical History  Procedure Date  . Stress cardiolite 05/30/2003    History   Social History  . Marital Status: Single    Spouse Name: N/A    Number of Children: N/A  . Years of Education: N/A   Occupational History  . Not on file.   Social History Main Topics  . Smoking status: Former Smoker    Quit date: 07/26/1988  . Smokeless tobacco: Not on file  . Alcohol Use: Not on file  . Drug Use: Not on file  . Sexually Active: Not on file   Other Topics Concern  . Not on file   Social History Narrative  . No narrative on file    Current Outpatient Prescriptions on File Prior to Visit  Medication Sig Dispense Refill  . aspirin 325 MG tablet Take 325 mg by mouth daily.        [name] HYDROcodone-acetaminophen (VICODIN) 5-500 MG per tablet Take 1 tablet by mouth every 4 (four) hours as needed. For pain  50 tablet  2  . insulin NPH-insulin regular (NOVOLIN 70/30) (70-30) 100 UNIT/ML injection 150 units every morning and 80 units with the evening meal      . lisinopril (PRINIVIL,ZESTRIL) 20 MG tablet Take 20 mg by mouth daily.        . simvastatin (ZOCOR) 80 MG tablet Take 1 tablet (80 mg total) by mouth at bedtime.  90 tablet  3   No Known Allergies  Family History  Problem Relation Age of Onset  . Cancer Mother     Breast Cancer  . Cancer Father     uncertain type  . Cancer Brother 50    Colon Cancer   BP 148/82  Pulse 75  Temp(Src) 98.1 F (36.7 C) (Oral)  Ht 5\' 11"  (1.803 m)  Wt 222 lb (100.699 kg)  BMI 30.96 kg/m2  SpO2 96%  Review of Systems denies hypoglycemia.        Objective:   Physical Exam VITAL SIGNS:  See vs page GENERAL: no distress SKIN:  Insulin injection sites at the anterior abdomen are normal.  ecg is refused Lab Results  Component Value Date   HGBA1C 8.7* 05/17/2011      Assessment & Plan:  Htn, with ? Of situational component Dm, needs increased rx

## 2011-05-17 NOTE — Patient Instructions (Addendum)
check your blood sugar 1 time a day.  vary the time of day when you check, between before the 3 meals, and at bedtime.  also check if you have symptoms of your blood sugar being too high or too low.  please keep a record of the readings and bring it to your next appointment here.  please call us sooner if you are having low blood sugar episodes.   blood tests are being requested for you today.  please call [phone] to hear your test results.  You will be prompted to enter the 9-digit "MRN" number that appears at the top left of this page, followed by #.  Then you will hear the message. pending the test results, please increase insulin to 150 units with breakfast, and 80 with the evening meal. Please make a follow-up appointment in 3 months.   (update: i left message on phone-tree:  rx as we discussed)

## 2011-06-21 ENCOUNTER — Other Ambulatory Visit: Payer: Self-pay

## 2011-06-21 MED ORDER — HYDROCODONE-ACETAMINOPHEN 5-500 MG PO TABS: 1.0000 | ORAL_TABLET | ORAL | Status: DC | PRN

## 2011-06-22 NOTE — Telephone Encounter (Signed)
Pt informed, Rx in cabinet for pt pick up  

## 2011-09-15 ENCOUNTER — Telehealth: Payer: Self-pay | Admitting: Endocrinology

## 2011-09-15 MED ORDER — INSULIN NPH ISOPHANE & REGULAR (70-30) 100 UNIT/ML ~~LOC~~ SUSP: SUBCUTANEOUS | Status: DC

## 2011-09-15 NOTE — Telephone Encounter (Signed)
Rx sent to [facility], pt informed.  

## 2011-09-15 NOTE — Telephone Encounter (Signed)
The pt called and is hoping for a refill of Novolin 70/30 100unit/ml sent to the [facility] pharmacy.  Thanks!

## 2011-10-07 DIAGNOSIS — Z0279 Encounter for issue of other medical certificate: Secondary | ICD-10-CM

## 2011-11-03 ENCOUNTER — Other Ambulatory Visit: Payer: Self-pay

## 2011-11-03 MED ORDER — HYDROCODONE-ACETAMINOPHEN 5-500 MG PO TABS: 1.0000 | ORAL_TABLET | ORAL | Status: DC | PRN

## 2011-11-03 NOTE — Telephone Encounter (Signed)
Pt's daughter is requesting refill of Hydrocodone for arthritis pain in hands, please advise in Dr George Hugh absence, thanks!

## 2011-11-03 NOTE — Telephone Encounter (Signed)
Pt informed, Rx in cabinet for pt pick up  

## 2011-11-04 DIAGNOSIS — Z0279 Encounter for issue of other medical certificate: Secondary | ICD-10-CM

## 2011-11-12 ENCOUNTER — Other Ambulatory Visit: Payer: Self-pay

## 2011-11-12 MED ORDER — INSULIN NPH ISOPHANE & REGULAR (70-30) 100 UNIT/ML ~~LOC~~ SUSP: SUBCUTANEOUS | Status: DC

## 2011-11-30 ENCOUNTER — Telehealth: Payer: Self-pay

## 2011-11-30 NOTE — Telephone Encounter (Signed)
Daughter states that pt was approved for medication assistance through manufacture on May 1st. Medication (Novolin 70/30) was shipped on this day as well. Daughter is requesting the status of medication, has this been received?

## 2011-11-30 NOTE — Telephone Encounter (Signed)
Informed daughter that we have not received shipment for insulin yet and that letter of approval that we received states that it may take up to 4 weeks to receive shipment. Pt's daughter states that she will contact [employer] on status of shipment.

## 2011-12-01 ENCOUNTER — Telehealth: Payer: Self-pay

## 2011-12-01 NOTE — Telephone Encounter (Signed)
Daughter advised that pt's Novolin 70/30 insulin arrived form [employer].

## 2012-03-01 ENCOUNTER — Telehealth: Payer: Self-pay | Admitting: *Deleted

## 2012-03-01 NOTE — Telephone Encounter (Signed)
Pt's daughter brought in forms for Patient Assistance for pt yesterday, per Dr. Everardo All, pt needs to come in for OV since he has not seen him in a while-can you let daughter or patient know he needs to scheduled F/U appointment? Thanks. (Daughter-Jacyln # [phone]).

## 2012-03-02 NOTE — Telephone Encounter (Signed)
appt made with Daughter for  03/13/2012 @ 9:30

## 2012-03-13 ENCOUNTER — Encounter: Payer: Self-pay | Admitting: Endocrinology

## 2012-03-13 ENCOUNTER — Ambulatory Visit (INDEPENDENT_AMBULATORY_CARE_PROVIDER_SITE_OTHER): Payer: Self-pay | Admitting: Endocrinology

## 2012-03-13 VITALS — BP 150/78 | HR 73 | Temp 98.1°F | Ht 71.0 in | Wt 224.0 lb

## 2012-03-13 DIAGNOSIS — E119 Type 2 diabetes mellitus without complications: Secondary | ICD-10-CM

## 2012-03-13 NOTE — Patient Instructions (Addendum)
check your blood sugar 1 time a day.  vary the time of day when you check, between before the 3 meals, and at bedtime.  also check if you have symptoms of your blood sugar being too high or too low.  please keep a record of the readings and bring it to your next appointment here.  please call us sooner if you are having low blood sugar episodes.   blood tests are being requested for you today.  You will receive a letter with results. good diet and exercise habits significanly improve the control of your diabetes.  please let me know if you wish to be referred to a dietician.  high blood sugar is very risky to your health.  you should see an eye doctor every year.  You are at higher than average risk for pneumonia and hepatitis-B.  You should be vaccinated against both.   controlling your blood pressure and cholesterol drastically reduces the damage diabetes does to your body.  this also applies to quitting smoking.  please discuss these with your doctor.  you should take an aspirin every day, unless you have been advised by a doctor not to. I realize you do not have health insurance, but there are many other services you need for your health.  These include screening tests for colon and prostate cancer; electrocardiogram, and others.  Please let me know if you want to get these important tests done.   Take the insulin, 120 units with breakfast, and 80 units with the evening meal. Please come back for a follow-up appointment in 2 weeks.

## 2012-03-13 NOTE — Progress Notes (Signed)
  Subjective:    Patient ID: Scott Donaldson, male    DOB: 11-Jun-1951, 61 y.o.   MRN: [id number]  HPI Pt returns for f/u of insulin-requiring DM (dx'ed 1990; complicated by peripheral sensory neuropathy). He says he seldom misses the insulin.  no cbg record, but he says he got hypoglycemia in the afternoon if he took 150 units in the morning.   He does not take his bp or chol meds. He will get medicare in 2 weeks.  Past Medical History  Diagnosis Date  . DIABETES MELLITUS, TYPE II 03/04/2007  . HYPERCHOLESTEROLEMIA 07/29/2008  . HYPERTENSION 03/04/2007  . Psoriasis     Past Surgical History  Procedure Date  . Stress cardiolite 05/30/2003    History   Social History  . Marital Status: Single    Spouse Name: N/A    Number of Children: N/A  . Years of Education: N/A   Occupational History  . Not on file.   Social History Main Topics  . Smoking status: Former Smoker    Quit date: 07/26/1988  . Smokeless tobacco: Not on file  . Alcohol Use: Not on file  . Drug Use: Not on file  . Sexually Active: Not on file   Other Topics Concern  . Not on file   Social History Narrative  . No narrative on file    Current Outpatient Prescriptions on File Prior to Visit  Medication Sig Dispense Refill  . insulin NPH-insulin regular (NOVOLIN 70/30) (70-30) 100 UNIT/ML injection 120 units with breakfast, and 80 units with the evening meal      . aspirin 325 MG tablet Take 325 mg by mouth daily.        [name] HYDROcodone-acetaminophen (VICODIN) 5-500 MG per tablet Take 1 tablet by mouth every 4 (four) hours as needed. For pain  50 tablet  2  . lisinopril (PRINIVIL,ZESTRIL) 20 MG tablet Take 20 mg by mouth daily.        . simvastatin (ZOCOR) 80 MG tablet Take 1 tablet (80 mg total) by mouth at bedtime.  90 tablet  3    No Known Allergies  Family History  Problem Relation Age of Onset  . Cancer Mother     Breast Cancer  . Cancer Father     uncertain type  . Cancer Brother 50    Colon  Cancer   BP 150/78  Pulse 73  Temp 98.1 F (36.7 C) (Oral)  Ht 5\' 11"  (1.803 m)  Wt 224 lb (101.606 kg)  BMI 31.24 kg/m2  SpO2 97%  Review of Systems No weight change    Objective:   Physical Exam Pulses: dorsalis pedis intact bilat.   Feet: no deformity.  no ulcer on the feet.  feet are of normal color and temp.  no edema Neuro: sensation is intact to touch on the feet, but decreased from normal     Assessment & Plan:  DM, with uncertain control HTN, therapy limited by noncompliance.  i'll do the best i can. Dyslipidemia, therapy limited by noncompliance.  i'll do the best i can.

## 2012-03-23 ENCOUNTER — Telehealth: Payer: Self-pay | Admitting: *Deleted

## 2012-03-23 NOTE — Telephone Encounter (Signed)
Called pt and left message on VM informing him Humalog 75/25 Patient assistance insulin is ready for pickup (in Side B small fridge in bag).

## 2012-03-28 ENCOUNTER — Encounter: Payer: Self-pay | Admitting: Endocrinology

## 2012-03-28 ENCOUNTER — Ambulatory Visit (INDEPENDENT_AMBULATORY_CARE_PROVIDER_SITE_OTHER): Payer: Medicare Other | Admitting: Endocrinology

## 2012-03-28 VITALS — BP 150/84 | HR 84 | Temp 98.5°F | Resp 16 | Wt 222.1 lb

## 2012-03-28 DIAGNOSIS — E119 Type 2 diabetes mellitus without complications: Secondary | ICD-10-CM

## 2012-03-28 NOTE — Progress Notes (Signed)
  Subjective:    Patient ID: Scott Donaldson, male    DOB: [birth year], 61 y.o.   MRN: [id number]  HPI Pt returns for f/u of insulin-requiring DM (dx'ed 1990; complicated by peripheral sensory neuropathy; therapy limited by pt's need for a simple and inexoensive regimen).  no cbg record, but states cbg's vary from the high-100's to the low-200's.  There is no trend throughout the day. Past Medical History  Diagnosis Date  . DIABETES MELLITUS, TYPE II 03/04/2007  . HYPERCHOLESTEROLEMIA 07/29/2008  . HYPERTENSION 03/04/2007  . Psoriasis     Past Surgical History  Procedure Date  . Stress cardiolite 05/30/2003    History   Social History  . Marital Status: Single    Spouse Name: N/A    Number of Children: N/A  . Years of Education: N/A   Occupational History  . Not on file.   Social History Main Topics  . Smoking status: Former Smoker    Quit date: 07/26/1988  . Smokeless tobacco: Not on file  . Alcohol Use: Not on file  . Drug Use: Not on file  . Sexually Active: Not on file   Other Topics Concern  . Not on file   Social History Narrative  . No narrative on file    Current Outpatient Prescriptions on File Prior to Visit  Medication Sig Dispense Refill  . aspirin 325 MG tablet Take 325 mg by mouth daily.        [name] HYDROcodone-acetaminophen (VICODIN) 5-500 MG per tablet Take 1 tablet by mouth every 4 (four) hours as needed. For pain  50 tablet  2  . insulin NPH-insulin regular (NOVOLIN 70/30) (70-30) 100 UNIT/ML injection 150 units with breakfast, and 80 units with the evening meal      . lisinopril (PRINIVIL,ZESTRIL) 20 MG tablet Take 20 mg by mouth daily.        . simvastatin (ZOCOR) 80 MG tablet Take 1 tablet (80 mg total) by mouth at bedtime.  90 tablet  3    No Known Allergies  Family History  Problem Relation Age of Onset  . Cancer Mother     Breast Cancer  . Cancer Father     uncertain type  . Cancer Brother 50    Colon Cancer    BP 150/84  Pulse 84   Temp 98.5 F (36.9 C) (Oral)  Resp 16  Wt 222 lb 2 oz (100.755 kg)   Review of Systems denies hypoglycemia    Objective:   Physical Exam VITAL SIGNS:  See vs page GENERAL: no distress SKIN:  Insulin injection sites at the anterior abdomen are normal     Assessment & Plan:  DM: The pattern of his cbg's indicates he needs some adjustment in his therapy

## 2012-03-28 NOTE — Patient Instructions (Addendum)
check your blood sugar 1 time a day.  vary the time of day when you check, between before the 3 meals, and at bedtime.  also check if you have symptoms of your blood sugar being too high or too low.  please keep a record of the readings and bring it to your next appointment here.  please call us sooner if you are having low blood sugar episodes.    Take the insulin, 150 units with breakfast, and 80 units with the evening meal. Please come back for a "welcome to medicare" appointment in 2-4 weeks.

## 2012-06-08 ENCOUNTER — Other Ambulatory Visit: Payer: Self-pay | Admitting: Internal Medicine

## 2012-06-09 NOTE — Telephone Encounter (Signed)
Ov is overdue. Let's address then 

## 2012-06-09 NOTE — Telephone Encounter (Signed)
Patient notified of a need for office visit prior to refilling Rx for Vicodin.per Dr.Ellison. Left message on voice mail of this of patient home and cell #.

## 2012-08-04 ENCOUNTER — Ambulatory Visit (INDEPENDENT_AMBULATORY_CARE_PROVIDER_SITE_OTHER): Payer: Medicare Other | Admitting: Endocrinology

## 2012-08-04 ENCOUNTER — Encounter: Payer: Self-pay | Admitting: Endocrinology

## 2012-08-04 VITALS — BP 146/70 | HR 78 | Temp 97.8°F | Wt 224.0 lb

## 2012-08-04 DIAGNOSIS — E119 Type 2 diabetes mellitus without complications: Secondary | ICD-10-CM

## 2012-08-04 DIAGNOSIS — R05 Cough: Secondary | ICD-10-CM

## 2012-08-04 MED ORDER — PROMETHAZINE-CODEINE 6.25-10 MG/5ML PO SYRP
5.0000 mL | ORAL_SOLUTION | ORAL | Status: DC | PRN
Start: 2012-08-04 — End: 2012-10-09

## 2012-08-04 MED ORDER — FLUTICASONE-SALMETEROL 100-50 MCG/DOSE IN AEPB: 1.0000 | INHALATION_SPRAY | Freq: Two times a day (BID) | RESPIRATORY_TRACT | Status: DC

## 2012-08-04 MED ORDER — CEFUROXIME AXETIL 250 MG PO TABS: 250.0000 mg | ORAL_TABLET | Freq: Two times a day (BID) | ORAL | Status: AC

## 2012-08-04 MED ORDER — SIMVASTATIN 80 MG PO TABS: 80.0000 mg | ORAL_TABLET | Freq: Every day | ORAL | Status: DC

## 2012-08-04 NOTE — Patient Instructions (Addendum)
let's check a chest-x-ray. Here are 3 prescriptions. I hope you feel better soon.  If you don't feel better by next week, please call back.  Please call sooner if you get worse. Please come back for a "welcome to medicare" appointment soon.  On this type of insulin, you should eat your meals on a regular schedule.

## 2012-08-04 NOTE — Progress Notes (Signed)
  Subjective:    Patient ID: Scott Donaldson, male    DOB: Jan 20, 1951, 62 y.o.   MRN: [id number]  HPI Pt states 1 month of moderate prod-quality cough in the chest, and assoc slight wheezing.   He says he does not take zestril no cbg record, but states cbg's are low only when he misses a meal. Past Medical History  Diagnosis Date  . DIABETES MELLITUS, TYPE II 03/04/2007  . HYPERCHOLESTEROLEMIA 07/29/2008  . HYPERTENSION 03/04/2007  . Psoriasis     Past Surgical History  Procedure Date  . Stress cardiolite 05/30/2003    History   Social History  . Marital Status: Single    Spouse Name: N/A    Number of Children: N/A  . Years of Education: N/A   Occupational History  . Not on file.   Social History Main Topics  . Smoking status: Former Smoker    Quit date: 07/26/1988  . Smokeless tobacco: Not on file  . Alcohol Use: Not on file  . Drug Use: Not on file  . Sexually Active: Not on file   Other Topics Concern  . Not on file   Social History Narrative  . No narrative on file    Current Outpatient Prescriptions on File Prior to Visit  Medication Sig Dispense Refill  . aspirin 325 MG tablet Take 325 mg by mouth daily.        [name] HYDROcodone-acetaminophen (VICODIN) 5-500 MG per tablet Take 1 tablet by mouth every 4 (four) hours as needed. For pain  50 tablet  2  . insulin NPH-insulin regular (NOVOLIN 70/30) (70-30) 100 UNIT/ML injection 150 units with breakfast, and 80 units with the evening meal      . simvastatin (ZOCOR) 80 MG tablet Take 1 tablet (80 mg total) by mouth at bedtime.  90 tablet  3  . Fluticasone-Salmeterol (ADVAIR) 100-50 MCG/DOSE AEPB Inhale 1 puff into the lungs 2 (two) times daily.  1 each  3    No Known Allergies  Family History  Problem Relation Age of Onset  . Cancer Mother     Breast Cancer  . Cancer Father     uncertain type  . Cancer Brother 50    Colon Cancer    BP 146/70  Pulse 78  Temp 97.8 F (36.6 C) (Oral)  Wt 224 lb (101.606 kg)   SpO2 95%   Review of Systems Denies fever and sob.    Objective:   Physical Exam VITAL SIGNS:  See vs page GENERAL: no distress LUNGS:  Clear to auscultation, except for a few exp wheezes.     Assessment & Plan:  Acute bronchitis, new.  i ordered cxr. HTN, needs increased rx.  He may have a situational component DM, apparently well-controlled.

## 2012-09-05 ENCOUNTER — Encounter: Payer: Medicare Other | Admitting: Endocrinology

## 2012-10-09 ENCOUNTER — Encounter: Payer: Self-pay | Admitting: Endocrinology

## 2012-10-09 ENCOUNTER — Ambulatory Visit (INDEPENDENT_AMBULATORY_CARE_PROVIDER_SITE_OTHER): Payer: Medicare Other | Admitting: Endocrinology

## 2012-10-09 VITALS — BP 128/76 | HR 88 | Temp 98.4°F | Wt 225.0 lb

## 2012-10-09 DIAGNOSIS — R05 Cough: Secondary | ICD-10-CM

## 2012-10-09 MED ORDER — PROMETHAZINE-CODEINE 6.25-10 MG/5ML PO SYRP: 5.0000 mL | ORAL_SOLUTION | ORAL | Status: DC | PRN

## 2012-10-09 MED ORDER — AZITHROMYCIN 500 MG PO TABS: 500.0000 mg | ORAL_TABLET | Freq: Every day | ORAL | Status: DC

## 2012-10-09 MED ORDER — ALBUTEROL SULFATE HFA 108 (90 BASE) MCG/ACT IN AERS: 2.0000 | INHALATION_SPRAY | Freq: Four times a day (QID) | RESPIRATORY_TRACT | Status: DC | PRN

## 2012-10-09 NOTE — Patient Instructions (Addendum)
let's check a chest-x-ray. i have sent 2 prescriptions to your pharmacy Here is a prescription for cough syrup. I hope you feel better soon.  If you don't feel better by next week, please call back.  Please call sooner if you get worse. Please come back for a "welcome to medicare" appointment soon.  On this type of insulin, you should eat your meals on a regular schedule. Here is a new meter.  Here is a brochure, with some phone numbers, to call for free strips.

## 2012-10-09 NOTE — Progress Notes (Signed)
  Subjective:    Patient ID: Scott Donaldson, male    DOB: 1950/11/01, 62 y.o.   MRN: [id number]  HPI Pt returns for f/u of insulin-requiring DM (dx'ed 1990; complicated by peripheral sensory neuropathy; therapy limited by pt's need for a simple and inexpensive regimen).  He does not check cbg's.   Pt states 7 days of moderate prod-quality cough in the chest, and assoc pain.    Past Medical History  Diagnosis Date  . DIABETES MELLITUS, TYPE II 03/04/2007  . HYPERCHOLESTEROLEMIA 07/29/2008  . HYPERTENSION 03/04/2007  . Psoriasis     Past Surgical History  Procedure Laterality Date  . Stress cardiolite  05/30/2003    History   Social History  . Marital Status: Single    Spouse Name: N/A    Number of Children: N/A  . Years of Education: N/A   Occupational History  . Not on file.   Social History Main Topics  . Smoking status: Former Smoker    Quit date: 07/26/1988  . Smokeless tobacco: Not on file  . Alcohol Use: Not on file  . Drug Use: Not on file  . Sexually Active: Not on file   Other Topics Concern  . Not on file   Social History Narrative  . No narrative on file    Current Outpatient Prescriptions on File Prior to Visit  Medication Sig Dispense Refill  . aspirin 325 MG tablet Take 325 mg by mouth daily.        . Fluticasone-Salmeterol (ADVAIR) 100-50 MCG/DOSE AEPB Inhale 1 puff into the lungs 2 (two) times daily.  1 each  3  . HYDROcodone-acetaminophen (VICODIN) 5-500 MG per tablet Take 1 tablet by mouth every 4 (four) hours as needed. For pain  50 tablet  2  . insulin NPH-insulin regular (NOVOLIN 70/30) (70-30) 100 UNIT/ML injection 150 units with breakfast, and 80 units with the evening meal      . simvastatin (ZOCOR) 80 MG tablet Take 1 tablet (80 mg total) by mouth at bedtime.  90 tablet  3   No current facility-administered medications on file prior to visit.    No Known Allergies  Family History  Problem Relation Age of Onset  . Cancer Mother    Breast Cancer  . Cancer Father     uncertain type  . Cancer Brother 50    Colon Cancer    BP 128/76  Pulse 88  Temp(Src) 98.4 F (36.9 C)  Wt 225 lb (102.059 kg)  BMI 31.39 kg/m2  SpO2 96%  Review of Systems He had low-grade fever, but this is resolved.  He also has wheezing.      Objective:   Physical Exam VITAL SIGNS:  See vs page GENERAL: no distress head: no deformity eyes: no periorbital swelling, no proptosis external nose and ears are normal mouth: no lesion seen Both eac's and tm's are normal LUNGS:  Clear to auscultation.    Assessment & Plan:  Acute bronchitis, new DM, therapy limited by noncompliance.  i'll do the best i can.

## 2012-10-20 ENCOUNTER — Encounter: Payer: Medicare Other | Admitting: Endocrinology

## 2012-10-20 DIAGNOSIS — Z0289 Encounter for other administrative examinations: Secondary | ICD-10-CM

## 2012-11-03 ENCOUNTER — Ambulatory Visit (INDEPENDENT_AMBULATORY_CARE_PROVIDER_SITE_OTHER): Payer: Medicare Other | Admitting: Endocrinology

## 2012-11-03 ENCOUNTER — Encounter: Payer: Self-pay | Admitting: Endocrinology

## 2012-11-03 VITALS — BP 134/80 | HR 80 | Wt 225.0 lb

## 2012-11-03 DIAGNOSIS — E7800 Pure hypercholesterolemia, unspecified: Secondary | ICD-10-CM

## 2012-11-03 DIAGNOSIS — E119 Type 2 diabetes mellitus without complications: Secondary | ICD-10-CM

## 2012-11-03 DIAGNOSIS — I10 Essential (primary) hypertension: Secondary | ICD-10-CM

## 2012-11-03 DIAGNOSIS — Z125 Encounter for screening for malignant neoplasm of prostate: Secondary | ICD-10-CM

## 2012-11-03 DIAGNOSIS — Z0000 Encounter for general adult medical examination without abnormal findings: Secondary | ICD-10-CM | POA: Insufficient documentation

## 2012-11-03 LAB — URINALYSIS, ROUTINE W REFLEX MICROSCOPIC
Ketones, ur: NEGATIVE
Specific Gravity, Urine: 1.02 (ref 1.000–1.030)
Urine Glucose: >=1000
pH: 5.5 (ref 5.0–8.0)

## 2012-11-03 LAB — HEPATIC FUNCTION PANEL
AST: 21 U/L (ref 0–37)
Albumin: 4.3 g/dL (ref 3.5–5.2)
Total Bilirubin: 0.3 mg/dL (ref 0.3–1.2)

## 2012-11-03 LAB — BASIC METABOLIC PANEL
CO2: 23 mEq/L (ref 19–32)
Calcium: 9.6 mg/dL (ref 8.4–10.5)
Chloride: 95 mEq/L — ABNORMAL LOW (ref 96–112)
Glucose, Bld: 248 mg/dL — ABNORMAL HIGH (ref 70–99)
Sodium: 130 mEq/L — ABNORMAL LOW (ref 135–145)

## 2012-11-03 LAB — CBC WITH DIFFERENTIAL/PLATELET
Basophils Relative: 0.7 % (ref 0.0–3.0)
Eosinophils Relative: 1.7 % (ref 0.0–5.0)
Hemoglobin: 14.5 g/dL (ref 13.0–17.0)
Lymphocytes Relative: 48.6 % — ABNORMAL HIGH (ref 12.0–46.0)
Monocytes Relative: 6.1 % (ref 3.0–12.0)
Neutro Abs: 2.5 10*3/uL (ref 1.4–7.7)
Neutrophils Relative %: 42.9 % — ABNORMAL LOW (ref 43.0–77.0)
RBC: 4.87 Mil/uL (ref 4.22–5.81)
WBC: 5.8 10*3/uL (ref 4.5–10.5)

## 2012-11-03 LAB — LDL CHOLESTEROL, DIRECT: Direct LDL: 74.8 mg/dL

## 2012-11-03 LAB — MICROALBUMIN / CREATININE URINE RATIO
Creatinine,U: 140.5 mg/dL
Microalb Creat Ratio: 4.6 mg/g (ref 0.0–30.0)

## 2012-11-03 LAB — LIPID PANEL
Cholesterol: 315 mg/dL — ABNORMAL HIGH (ref 0–200)
HDL: 32.6 mg/dL — ABNORMAL LOW (ref >39.00)
VLDL: 303 mg/dL — ABNORMAL HIGH (ref 0.0–40.0)

## 2012-11-03 LAB — TSH: TSH: 1.51 u[IU]/mL (ref 0.35–5.50)

## 2012-11-03 NOTE — Progress Notes (Signed)
  Subjective:    Patient ID: Scott Donaldson, male    DOB: 09/12/1950, 62 y.o.   MRN: [id number]  HPI The state of at least three ongoing medical problems is addressed today, with interval history of each noted here: Pt returns for f/u of insulin-requiring DM (dx'ed 1990; complicated by peripheral sensory neuropathy; therapy limited by pt's need for a simple and inexpensive regimen; he has never had severe hypoglycemia or DKA).  no cbg record, but states cbg's vary from 78-300.  It is in general higher as the day goes on.   Dyslipidemia: he denies weight change HTN: he denies sob Past Medical History  Diagnosis Date  . DIABETES MELLITUS, TYPE II 03/04/2007  . HYPERCHOLESTEROLEMIA 07/29/2008  . HYPERTENSION 03/04/2007  . Psoriasis     Past Surgical History  Procedure Laterality Date  . Stress cardiolite  05/30/2003    History   Social History  . Marital Status: Single    Spouse Name: N/A    Number of Children: N/A  . Years of Education: N/A   Occupational History  . Not on file.   Social History Main Topics  . Smoking status: Former Smoker    Quit date: 07/26/1988  . Smokeless tobacco: Not on file  . Alcohol Use: Not on file  . Drug Use: Not on file  . Sexually Active: Not on file   Other Topics Concern  . Not on file   Social History Narrative  . No narrative on file    Current Outpatient Prescriptions on File Prior to Visit  Medication Sig Dispense Refill  . aspirin 325 MG tablet Take 325 mg by mouth daily.        [name] HYDROcodone-acetaminophen (VICODIN) 5-500 MG per tablet Take 1 tablet by mouth every 4 (four) hours as needed. For pain  50 tablet  2  . insulin NPH-insulin regular (NOVOLIN 70/30) (70-30) 100 UNIT/ML injection 150 units with breakfast, and 80 units with the evening meal      . simvastatin (ZOCOR) 80 MG tablet Take 1 tablet (80 mg total) by mouth at bedtime.  90 tablet  3   No current facility-administered medications on file prior to visit.    No  Known Allergies  Family History  Problem Relation Age of Onset  . Cancer Mother     Breast Cancer  . Cancer Father     uncertain type  . Cancer Brother 50    Colon Cancer    BP 134/80  Pulse 80  Wt 225 lb (102.059 kg)  BMI 31.39 kg/m2  SpO2 96%  Review of Systems denies hypoglycemia.  Wheezing has resolved    Objective:   Physical Exam VITAL SIGNS:  See vs page GENERAL: no distress SKIN:  Insulin injection sites at the anterior abdomen are normal     Assessment & Plan:  HTN, well-controlled Dyslipidemia, on zocor DM: The pattern of his cbg's indicates he needs some adjustment in his therapy

## 2012-11-03 NOTE — Patient Instructions (Addendum)
Please come back for a "welcome to medicare" appointment soon.  On this type of insulin, you should eat your meals on a regular schedule.  blood tests are being requested for you today.  We'll contact you with results.   check your blood sugar twice a day.  vary the time of day when you check, between before the 3 meals, and at bedtime.  also check if you have symptoms of your blood sugar being too high or too low.  please keep a record of the readings and bring it to your next appointment here.  please call us sooner if your blood sugar goes below 70, or if you have a lot of readings over 200.  Please change your insulin to 160 units with breakfast, and 70 units with the evening meal.  However, take just 10 units in the morning if you are going to be active.

## 2012-11-06 LAB — PSA: PSA: 3.28 ng/mL (ref 0.10–4.00)

## 2012-11-08 ENCOUNTER — Telehealth: Payer: Self-pay | Admitting: Endocrinology

## 2012-11-08 NOTE — Telephone Encounter (Signed)
See previous message

## 2012-11-08 NOTE — Telephone Encounter (Signed)
Pt's daughter called back. She says someone called to her dad. She is unsure if it was regarding blood results or paperwork left last week. Please call Adela Lank # [phone]. / Roanna Raider

## 2013-02-05 ENCOUNTER — Telehealth: Payer: Self-pay | Admitting: Endocrinology

## 2013-02-05 NOTE — Telephone Encounter (Signed)
Ov this week Please also see a dentist asap

## 2013-02-05 NOTE — Telephone Encounter (Signed)
Pt's dtr states that pt has a really bad toothache and can't take any meds because he can't eat so cbg is not regulated, last pm cbg was 261 please advise

## 2013-02-07 NOTE — Telephone Encounter (Signed)
LM FOR PT TO CALL AND SCHEDULE AN APPT THIS WEEK WITH DR Everardo All

## 2013-06-08 ENCOUNTER — Telehealth: Payer: Self-pay

## 2013-06-08 ENCOUNTER — Other Ambulatory Visit: Payer: Self-pay | Admitting: *Deleted

## 2013-06-08 MED ORDER — INSULIN NPH ISOPHANE & REGULAR (70-30) 100 UNIT/ML ~~LOC~~ SUSP: SUBCUTANEOUS | Status: DC

## 2013-06-08 NOTE — Telephone Encounter (Signed)
The patient's daughter called and stated the patient is completely out of insulin. She is hoping to get a refill called in to the [facility] on [address] so the patient will have the medication to last him until his apt Tuesday afternoon (the 18th). Can this be called in? The patient's daughter is Adela Lank, her call back - [phone] (she is handling his care). Called pt's daughter and advised her that I sent it to the pharmacy.

## 2013-06-08 NOTE — Telephone Encounter (Signed)
The patient's daughter called and stated the patient is completely out of insulin.  She is hoping to get a refill called in to the [facility] on [address] so the patient will have the medication to last him until his apt Tuesday afternoon (the 18th).  Can this be called in?   The patient's daughter is Adela Lank, her call back - [phone]  (she is handling his care)  Thanks!

## 2013-06-08 NOTE — Telephone Encounter (Signed)
Refill x 1 Ov is due 

## 2013-06-12 ENCOUNTER — Ambulatory Visit (INDEPENDENT_AMBULATORY_CARE_PROVIDER_SITE_OTHER): Payer: Medicare Other | Admitting: Endocrinology

## 2013-06-12 ENCOUNTER — Encounter: Payer: Self-pay | Admitting: Endocrinology

## 2013-06-12 VITALS — BP 160/88 | HR 68 | Temp 98.3°F | Resp 16 | Ht 71.0 in | Wt 216.2 lb

## 2013-06-12 DIAGNOSIS — E119 Type 2 diabetes mellitus without complications: Secondary | ICD-10-CM

## 2013-06-12 DIAGNOSIS — Z23 Encounter for immunization: Secondary | ICD-10-CM

## 2013-06-12 LAB — HEMOGLOBIN A1C: Hgb A1c MFr Bld: 10.5 % — ABNORMAL HIGH (ref 4.6–6.5)

## 2013-06-12 MED ORDER — INSULIN NPH ISOPHANE & REGULAR (70-30) 100 UNIT/ML ~~LOC~~ SUSP: SUBCUTANEOUS | Status: DC

## 2013-06-12 NOTE — Progress Notes (Signed)
  Subjective:    Patient ID: Scott Donaldson, male    DOB: [birth year], 62 y.o.   MRN: [id number]  HPI Pt returns for f/u of insulin-requiring DM (dx'ed 1990, on a routine blood test; complicated by peripheral sensory neuropathy; therapy limited by pt's need for a simple and inexpensive regimen; he has never had severe hypoglycemia or DKA).  He ran out of insulin 3 weeks ago.  He says insurance won't pay, but pt shows me a [facility] medicare complete card.  He says he has part d coverage with [employer] also.  no cbg record, but states cbg's are approx 400 since he ran out of insulin.   Past Medical History  Diagnosis Date  . DIABETES MELLITUS, TYPE II 03/04/2007  . HYPERCHOLESTEROLEMIA 07/29/2008  . HYPERTENSION 03/04/2007  . Psoriasis     Past Surgical History  Procedure Laterality Date  . Stress cardiolite  05/30/2003    History   Social History  . Marital Status: Single    Spouse Name: N/A    Number of Children: N/A  . Years of Education: N/A   Occupational History  . Not on file.   Social History Main Topics  . Smoking status: Former Smoker    Quit date: 07/26/1988  . Smokeless tobacco: Not on file  . Alcohol Use: Not on file  . Drug Use: Not on file  . Sexual Activity: Not on file   Other Topics Concern  . Not on file   Social History Narrative  . No narrative on file    Current Outpatient Prescriptions on File Prior to Visit  Medication Sig Dispense Refill  . aspirin 325 MG tablet Take 325 mg by mouth daily.        [name] HYDROcodone-acetaminophen (VICODIN) 5-500 MG per tablet Take 1 tablet by mouth every 4 (four) hours as needed. For pain  50 tablet  2  . simvastatin (ZOCOR) 80 MG tablet Take 1 tablet (80 mg total) by mouth at bedtime.  90 tablet  3   No current facility-administered medications on file prior to visit.    No Known Allergies  Family History  Problem Relation Age of Onset  . Cancer Mother     Breast Cancer  . Cancer Father     uncertain type  . Cancer  Brother 50    Colon Cancer    BP 160/88  Pulse 68  Temp(Src) 98.3 F (36.8 C) (Oral)  Resp 16  Ht 5\' 11"  (1.803 m)  Wt 216 lb 3.2 oz (98.068 kg)  BMI 30.17 kg/m2   Review of Systems denies hypoglycemia and weight change.      Objective:   Physical Exam VITAL SIGNS:  See vs page GENERAL: no distress   Lab Results  Component Value Date   HGBA1C 10.5* 06/12/2013      Assessment & Plan:  DM: This insulin regimen was chosen from multiple options, for its simplicity.  The benefits of glycemic control must be weighed against the risks of hypoglycemia.   Economic circumstances: these limit the rx of DM HTN: with possible situational component.

## 2013-06-12 NOTE — Patient Instructions (Addendum)
Please come back for a regular physical appointment soon.  On this type of insulin schedule, you should eat meals on a regular schedule.  If a meal is missed or significantly delayed, your blood sugar could go low. blood tests are being requested for you today.  We'll contact you with results.   check your blood sugar twice a day.  vary the time of day when you check, between before the 3 meals, and at bedtime.  also check if you have symptoms of your blood sugar being too high or too low.  please keep a record of the readings and bring it to your next appointment here.  please call us sooner if your blood sugar goes below 70, or if you have a lot of readings over 200.  Please resume your insulin: 160 units with breakfast, and 70 units with the evening meal.  However, take just 10 units in the morning if you are going to be active.  i have sent a prescription to your pharmacy, to refill it.  Please show them your [facility] card.  If this does not work, call the customer service number on the card.

## 2013-06-13 ENCOUNTER — Telehealth: Payer: Self-pay | Admitting: *Deleted

## 2013-06-13 NOTE — Telephone Encounter (Signed)
Left message for patient to call office.  

## 2013-08-20 ENCOUNTER — Ambulatory Visit (INDEPENDENT_AMBULATORY_CARE_PROVIDER_SITE_OTHER): Payer: Medicare Other | Admitting: Endocrinology

## 2013-08-20 ENCOUNTER — Encounter: Payer: Self-pay | Admitting: Endocrinology

## 2013-08-20 VITALS — BP 136/60 | HR 61 | Temp 98.2°F | Ht 71.0 in | Wt 217.0 lb

## 2013-08-20 DIAGNOSIS — E119 Type 2 diabetes mellitus without complications: Secondary | ICD-10-CM

## 2013-08-20 MED ORDER — SIMVASTATIN 80 MG PO TABS: 80.0000 mg | ORAL_TABLET | Freq: Every day | ORAL | Status: DC

## 2013-08-20 MED ORDER — AMOXICILLIN 500 MG PO CAPS: 500.0000 mg | ORAL_CAPSULE | Freq: Three times a day (TID) | ORAL | Status: DC

## 2013-08-20 MED ORDER — INSULIN NPH ISOPHANE & REGULAR (70-30) 100 UNIT/ML ~~LOC~~ SUSP: 50.0000 [IU] | Freq: Two times a day (BID) | SUBCUTANEOUS | Status: DC

## 2013-08-20 NOTE — Progress Notes (Signed)
   Subjective:    Patient ID: Scott Donaldson, male    DOB: April 05, 1951, 63 y.o.   MRN: [id number]  HPI Pt returns for f/u of insulin-requiring DM (dx'ed 1990, on a routine blood test; complicated by peripheral sensory neuropathy; therapy limited by pt's need for a simple and inexpensive regimen; he has never had severe hypoglycemia or DKA).  He says cbg's are frequently mildly at any time of day, but most commonly at lunch, or in the afternoon.  This happens even when he takes only 50 units in the morning.   Past Medical History  Diagnosis Date  . DIABETES MELLITUS, TYPE II 03/04/2007  . HYPERCHOLESTEROLEMIA 07/29/2008  . HYPERTENSION 03/04/2007  . Psoriasis     Past Surgical History  Procedure Laterality Date  . Stress cardiolite  05/30/2003    History   Social History  . Marital Status: Single    Spouse Name: N/A    Number of Children: N/A  . Years of Education: N/A   Occupational History  . Not on file.   Social History Main Topics  . Smoking status: Former Smoker    Quit date: 07/26/1988  . Smokeless tobacco: Not on file  . Alcohol Use: Not on file  . Drug Use: Not on file  . Sexual Activity: Not on file   Other Topics Concern  . Not on file   Social History Narrative  . No narrative on file    Current Outpatient Prescriptions on File Prior to Visit  Medication Sig Dispense Refill  . aspirin 325 MG tablet Take 325 mg by mouth daily.         No current facility-administered medications on file prior to visit.    No Known Allergies  Family History  Problem Relation Age of Onset  . Cancer Mother     Breast Cancer  . Cancer Father     uncertain type  . Cancer Brother 50    Colon Cancer    BP 136/60  Pulse 61  Temp(Src) 98.2 F (36.8 C) (Oral)  Ht 5\' 11"  (1.803 m)  Wt 217 lb (98.431 kg)  BMI 30.28 kg/m2  SpO2 96%  Review of Systems He has pain at the right upper dental area.  He will see a dentist tomorrow.     Objective:   Physical Exam VITAL  SIGNS:  See vs page GENERAL: no distress      Assessment & Plan:  DM: This insulin regimen was chosen from multiple options, for its simplicity.  The benefits of glycemic control must be weighed against the risks of hypoglycemia.  It is apparently overcontrolled.   Dental pain, new.  He needs to see a dentist asap.

## 2013-08-20 NOTE — Patient Instructions (Addendum)
Please come back for a regular physical appointment in 3 months.  On this type of insulin schedule, you should eat meals on a regular schedule.  If a meal is missed or significantly delayed, your blood sugar could go low. blood tests are being requested for you today.  We'll contact you with results.   check your blood sugar twice a day.  vary the time of day when you check, between before the 3 meals, and at bedtime.  also check if you have symptoms of your blood sugar being too high or too low.  please keep a record of the readings and bring it to your next appointment here.  please call us sooner if your blood sugar goes below 70, or if you have a lot of readings over 200.  Please reduce your insulin to 50 units with breakfast, and 50 units with the evening meal.  However, take just 25 units in the morning if you are going to be active.   i have sent a prescription to your pharmacy, for an antibiotic pill.  This is not a substitute for dental care.  You need to go ASAP.

## 2013-09-14 ENCOUNTER — Other Ambulatory Visit (INDEPENDENT_AMBULATORY_CARE_PROVIDER_SITE_OTHER): Payer: Medicare Other

## 2013-09-14 DIAGNOSIS — E119 Type 2 diabetes mellitus without complications: Secondary | ICD-10-CM

## 2013-09-14 LAB — HEMOGLOBIN A1C: Hgb A1c MFr Bld: 9.5 % — ABNORMAL HIGH (ref 4.6–6.5)

## 2013-11-20 ENCOUNTER — Ambulatory Visit: Payer: Medicare Other | Admitting: Endocrinology

## 2014-02-12 ENCOUNTER — Telehealth: Payer: Self-pay

## 2014-02-12 NOTE — Telephone Encounter (Signed)
LVM for  Pt to call back and schedule CPE with PCP.

## 2014-03-21 ENCOUNTER — Telehealth: Payer: Self-pay | Admitting: *Deleted

## 2014-03-21 NOTE — Telephone Encounter (Signed)
Diabetic bundle, left message for patient to call back and schedule appointment.

## 2014-04-15 ENCOUNTER — Encounter: Payer: Self-pay | Admitting: Endocrinology

## 2014-04-15 ENCOUNTER — Ambulatory Visit (INDEPENDENT_AMBULATORY_CARE_PROVIDER_SITE_OTHER): Payer: Medicare Other | Admitting: Endocrinology

## 2014-04-15 VITALS — BP 116/62 | HR 52 | Temp 97.9°F | Ht 71.0 in | Wt 209.0 lb

## 2014-04-15 DIAGNOSIS — L97509 Non-pressure chronic ulcer of other part of unspecified foot with unspecified severity: Secondary | ICD-10-CM

## 2014-04-15 DIAGNOSIS — L97519 Non-pressure chronic ulcer of other part of right foot with unspecified severity: Secondary | ICD-10-CM

## 2014-04-15 DIAGNOSIS — E119 Type 2 diabetes mellitus without complications: Secondary | ICD-10-CM

## 2014-04-15 DIAGNOSIS — M2550 Pain in unspecified joint: Secondary | ICD-10-CM

## 2014-04-15 DIAGNOSIS — Z125 Encounter for screening for malignant neoplasm of prostate: Secondary | ICD-10-CM

## 2014-04-15 DIAGNOSIS — I10 Essential (primary) hypertension: Secondary | ICD-10-CM

## 2014-04-15 DIAGNOSIS — Z0000 Encounter for general adult medical examination without abnormal findings: Secondary | ICD-10-CM

## 2014-04-15 DIAGNOSIS — E7800 Pure hypercholesterolemia, unspecified: Secondary | ICD-10-CM

## 2014-04-15 DIAGNOSIS — H547 Unspecified visual loss: Secondary | ICD-10-CM | POA: Insufficient documentation

## 2014-04-15 HISTORY — DX: Non-pressure chronic ulcer of other part of right foot with unspecified severity: L97.519

## 2014-04-15 LAB — URINALYSIS, ROUTINE W REFLEX MICROSCOPIC
BILIRUBIN URINE: NEGATIVE
HGB URINE DIPSTICK: NEGATIVE
Ketones, ur: NEGATIVE
Leukocytes, UA: NEGATIVE
Nitrite: NEGATIVE
RBC / HPF: NONE SEEN (ref 0–?)
Specific Gravity, Urine: 1.01 (ref 1.000–1.030)
TOTAL PROTEIN, URINE-UPE24: NEGATIVE
Urobilinogen, UA: 0.2 (ref 0.0–1.0)
pH: 6 (ref 5.0–8.0)

## 2014-04-15 LAB — MICROALBUMIN / CREATININE URINE RATIO
Creatinine,U: 82.7 mg/dL
MICROALB UR: 1.3 mg/dL (ref 0.0–1.9)
MICROALB/CREAT RATIO: 1.6 mg/g (ref 0.0–30.0)

## 2014-04-15 LAB — HEPATIC FUNCTION PANEL
ALK PHOS: 57 U/L (ref 39–117)
ALT: 18 U/L (ref 0–53)
AST: 20 U/L (ref 0–37)
Albumin: 4.1 g/dL (ref 3.5–5.2)
BILIRUBIN TOTAL: 0.4 mg/dL (ref 0.2–1.2)
Bilirubin, Direct: 0.1 mg/dL (ref 0.0–0.3)
Total Protein: 7.2 g/dL (ref 6.0–8.3)

## 2014-04-15 LAB — CBC WITH DIFFERENTIAL/PLATELET
BASOS PCT: 0.5 % (ref 0.0–3.0)
Basophils Absolute: 0 10*3/uL (ref 0.0–0.1)
EOS PCT: 1.7 % (ref 0.0–5.0)
Eosinophils Absolute: 0.1 10*3/uL (ref 0.0–0.7)
HCT: 38.5 % — ABNORMAL LOW (ref 39.0–52.0)
Hemoglobin: 13.3 g/dL (ref 13.0–17.0)
LYMPHS ABS: 1.5 10*3/uL (ref 0.7–4.0)
Lymphocytes Relative: 34.7 % (ref 12.0–46.0)
MCHC: 34.4 g/dL (ref 30.0–36.0)
MCV: 86.7 fl (ref 78.0–100.0)
MONO ABS: 0.2 10*3/uL (ref 0.1–1.0)
Monocytes Relative: 5.2 % (ref 3.0–12.0)
Neutro Abs: 2.5 10*3/uL (ref 1.4–7.7)
Neutrophils Relative %: 57.9 % (ref 43.0–77.0)
Platelets: 184 10*3/uL (ref 150.0–400.0)
RBC: 4.44 Mil/uL (ref 4.22–5.81)
RDW: 13.6 % (ref 11.5–15.5)
WBC: 4.3 10*3/uL (ref 4.0–10.5)

## 2014-04-15 LAB — BASIC METABOLIC PANEL
BUN: 12 mg/dL (ref 6–23)
CALCIUM: 9 mg/dL (ref 8.4–10.5)
CO2: 26 mEq/L (ref 19–32)
CREATININE: 1 mg/dL (ref 0.4–1.5)
Chloride: 104 mEq/L (ref 96–112)
GFR: 84.85 mL/min (ref >60.00)
GLUCOSE: 248 mg/dL — AB (ref 70–99)
Potassium: 4 mEq/L (ref 3.5–5.1)
SODIUM: 138 meq/L (ref 135–145)

## 2014-04-15 LAB — TSH: TSH: 1.81 u[IU]/mL (ref 0.35–4.50)

## 2014-04-15 LAB — LIPID PANEL
CHOL/HDL RATIO: 7
CHOLESTEROL: 255 mg/dL — AB (ref 0–200)
HDL: 38.8 mg/dL — ABNORMAL LOW (ref >39.00)
NonHDL: 216.2
TRIGLYCERIDES: 211 mg/dL — AB (ref 0.0–149.0)
VLDL: 42.2 mg/dL — AB (ref 0.0–40.0)

## 2014-04-15 LAB — HEMOGLOBIN A1C: Hgb A1c MFr Bld: 10.7 % — ABNORMAL HIGH (ref 4.6–6.5)

## 2014-04-15 LAB — SEDIMENTATION RATE: SED RATE: 17 mm/h (ref 0–22)

## 2014-04-15 LAB — PSA: PSA: 3.81 ng/mL (ref 0.10–4.00)

## 2014-04-15 LAB — LDL CHOLESTEROL, DIRECT: Direct LDL: 175.9 mg/dL

## 2014-04-15 MED ORDER — AMOXICILLIN-POT CLAVULANATE 875-125 MG PO TABS: 1.0000 | ORAL_TABLET | Freq: Two times a day (BID) | ORAL | Status: AC

## 2014-04-15 MED ORDER — TRAMADOL HCL 50 MG PO TABS: 50.0000 mg | ORAL_TABLET | Freq: Four times a day (QID) | ORAL | Status: DC | PRN

## 2014-04-15 NOTE — Progress Notes (Signed)
Subjective:    Patient ID: Scott Donaldson, male    DOB: Jul 21, 1951, 63 y.o.   MRN: [id number]  HPI Pt returns for f/u of diabetes mellitus:  DM type: insulin-requiring type 2 [id number] Complications: peripheral sensory neuropathy Therapy: insulin DKA: never Severe hypoglycemia: never Pancreatitis: never Other info: he needs a simple insulin regimen, dur to ongoing noncompliance.  Interval history:  no cbg record, but states cbg's .  He does not know how much insulin he takes.  He also says he sometimes misses it altogether. Pt states 1 week of moderate pain at the right foot, but no assoc numbness.  He removed a splinter there. Past Medical History  Diagnosis Date  . DIABETES MELLITUS, TYPE II 03/04/2007  . HYPERCHOLESTEROLEMIA 07/29/2008  . HYPERTENSION 03/04/2007  . Psoriasis     Past Surgical History  Procedure Laterality Date  . Stress cardiolite  05/30/2003    History   Social History  . Marital Status: Single    Spouse Name: N/A    Number of Children: N/A  . Years of Education: N/A   Occupational History  . Not on file.   Social History Main Topics  . Smoking status: Former Smoker    Quit date: 07/26/1988  . Smokeless tobacco: Not on file  . Alcohol Use: Not on file  . Drug Use: Not on file  . Sexual Activity: Not on file   Other Topics Concern  . Not on file   Social History Narrative  . No narrative on file    Current Outpatient Prescriptions on File Prior to Visit  Medication Sig Dispense Refill  . amoxicillin (AMOXIL) 500 MG capsule Take 1 capsule (500 mg total) by mouth 3 (three) times daily.  21 capsule  0  . aspirin 325 MG tablet Take 325 mg by mouth daily.        . insulin NPH-regular Human (NOVOLIN 70/30) (70-30) 100 UNIT/ML injection Inject 50 Units into the skin 2 (two) times daily with a meal.  40 mL  11  . simvastatin (ZOCOR) 80 MG tablet Take 1 tablet (80 mg total) by mouth at bedtime.  90 tablet  3   No current  facility-administered medications on file prior to visit.    No Known Allergies  Family History  Problem Relation Age of Onset  . Cancer Mother     Breast Cancer  . Cancer Father     uncertain type  . Cancer Brother 50    Colon Cancer    BP 116/62  Pulse 52  Temp(Src) 97.9 F (36.6 C) (Oral)  Ht 5\' 11"  (1.803 m)  Wt 209 lb (94.802 kg)  BMI 29.16 kg/m2  SpO2 97%    Review of Systems  HENT: Negative for sore throat.   Respiratory: Negative for shortness of breath.   Cardiovascular: Negative for chest pain.  Gastrointestinal: Negative for diarrhea.  Endocrine: Negative for polyuria.  Genitourinary: Negative for difficulty urinating.  Skin: Negative for rash.  Allergic/Immunologic: Negative for immunocompromised state.  Neurological: Negative for syncope.  Hematological: Does not bruise/bleed easily.  Psychiatric/Behavioral: Negative for dysphoric mood.   Denies fever.  He has diffuse arthralgias.  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    Objective:   Physical Exam VITAL SIGNS:  See vs page GENERAL: no distress Pulses: dorsalis pedis intact bilat.   Feet: no deformity.  no edema.  At the plantar aspect of the right foot, there is a 1 cm area of  swelling,tenderness, and erythema (under the 3rd metatarsal head).  There is a central entry wound. Skin:  no ulcer on the feet.  normal color and temp. Neuro: sensation is intact to touch on the feet.   Lab Results  Component Value Date   WBC 4.3 04/15/2014   HGB 13.3 04/15/2014   HCT 38.5* 04/15/2014   PLT 184.0 04/15/2014   GLUCOSE 248* 04/15/2014   CHOL 255* 04/15/2014   TRIG 211.0* 04/15/2014   HDL 38.80* 04/15/2014   LDLDIRECT 175.9 04/15/2014   LDLCALC 119* 05/04/2007   ALT 18 04/15/2014   AST 20 04/15/2014   NA 138 04/15/2014   K 4.0 04/15/2014   CL 104 04/15/2014   CREATININE 1.0 04/15/2014   BUN 12 04/15/2014   CO2 26 04/15/2014   TSH 1.81 04/15/2014   PSA 3.81 04/15/2014   HGBA1C 10.7* 04/15/2014    MICROALBUR 1.3 04/15/2014       Assessment & Plan:  DM: severe exacerbation Foot ulcer, new Dyslipidemia: therapy limited by noncompliance.  i'll do the best i can. Visual loss, unchanged, uncertain etiology Arthralgias, chronic, unchanged   Patient is advised the following: Patient Instructions  Please come back for a regular physical appointment in 1 month. On this type of insulin schedule, you should eat meals on a regular schedule.  If a meal is missed or significantly delayed, your blood sugar could go low. blood tests are being requested for you today.  We'll contact you with results.   check your blood sugar twice a day.  vary the time of day when you check, between before the 3 meals, and at bedtime.  also check if you have symptoms of your blood sugar being too high or too low.  please keep a record of the readings and bring it to your next appointment here.  please call us sooner if your blood sugar goes below 70, or if you have a lot of readings over 200.  Please reduce your insulin to 50 units with breakfast, and 50 units with the evening meal.  However, take just 25 units in the morning if you are going to be active.   Please see a podiatry specialist.  you will receive a phone call, about a day and time for an appointment. i have sent a prescription to your pharmacy, and antibiotic pill. You will get better much faster if you elevate your right foot above the rest of your body. Here is a prescription for the joint pain.   Also, please see an eye specialist.  you will receive a phone call, about a day and time for an appointment.

## 2014-04-15 NOTE — Patient Instructions (Addendum)
Please come back for a regular physical appointment in 1 month. On this type of insulin schedule, you should eat meals on a regular schedule.  If a meal is missed or significantly delayed, your blood sugar could go low. blood tests are being requested for you today.  We'll contact you with results.   check your blood sugar twice a day.  vary the time of day when you check, between before the 3 meals, and at bedtime.  also check if you have symptoms of your blood sugar being too high or too low.  please keep a record of the readings and bring it to your next appointment here.  please call us sooner if your blood sugar goes below 70, or if you have a lot of readings over 200.  Please reduce your insulin to 50 units with breakfast, and 50 units with the evening meal.  However, take just 25 units in the morning if you are going to be active.   Please see a podiatry specialist.  you will receive a phone call, about a day and time for an appointment. i have sent a prescription to your pharmacy, and antibiotic pill. You will get better much faster if you elevate your right foot above the rest of your body. Here is a prescription for the joint pain.   Also, please see an eye specialist.  you will receive a phone call, about a day and time for an appointment.

## 2014-04-19 ENCOUNTER — Ambulatory Visit: Payer: Medicare Other | Admitting: Endocrinology

## 2014-05-01 ENCOUNTER — Telehealth: Payer: Self-pay

## 2014-05-01 NOTE — Telephone Encounter (Signed)
Pt was not home, but was able to speak to his daughter, Luisenrique Conran (on [place]).  She shared that they are still waiting to hear back regarding his ophthalmology referral.  It appears the referral has been placed.  Daughter was made aware and assured that she should hear back from office soon.

## 2014-05-17 DIAGNOSIS — Z0279 Encounter for issue of other medical certificate: Secondary | ICD-10-CM

## 2014-06-07 ENCOUNTER — Encounter: Payer: Self-pay | Admitting: Endocrinology

## 2014-07-31 DIAGNOSIS — H2513 Age-related nuclear cataract, bilateral: Secondary | ICD-10-CM | POA: Diagnosis not present

## 2014-07-31 DIAGNOSIS — E11329 Type 2 diabetes mellitus with mild nonproliferative diabetic retinopathy without macular edema: Secondary | ICD-10-CM | POA: Diagnosis not present

## 2014-08-19 DIAGNOSIS — H2512 Age-related nuclear cataract, left eye: Secondary | ICD-10-CM | POA: Diagnosis not present

## 2014-08-28 DIAGNOSIS — H2512 Age-related nuclear cataract, left eye: Secondary | ICD-10-CM | POA: Diagnosis not present

## 2014-08-28 DIAGNOSIS — H25812 Combined forms of age-related cataract, left eye: Secondary | ICD-10-CM | POA: Diagnosis not present

## 2015-01-15 ENCOUNTER — Telehealth: Payer: Self-pay | Admitting: Endocrinology

## 2015-01-15 NOTE — Telephone Encounter (Signed)
Patients daughter came intot he office today to schedule an OV with Dr. Loanne Drilling due to [name] fall 2 weeks ago affecting his upper (L) side of chest  She will be taking him to the ER due to Dr. Cordelia Pen absence   Please advise patient   Thank you

## 2015-01-15 NOTE — Telephone Encounter (Signed)
Noted  

## 2015-01-30 DIAGNOSIS — E11339 Type 2 diabetes mellitus with moderate nonproliferative diabetic retinopathy without macular edema: Secondary | ICD-10-CM | POA: Diagnosis not present

## 2015-01-30 DIAGNOSIS — H26493 Other secondary cataract, bilateral: Secondary | ICD-10-CM | POA: Diagnosis not present

## 2015-05-14 ENCOUNTER — Telehealth: Payer: Self-pay | Admitting: Endocrinology

## 2015-05-14 NOTE — Telephone Encounter (Signed)
Pt's daughter is wondering if we have received the shipment for the pt's humalog from pt assistance yet?

## 2015-05-16 ENCOUNTER — Encounter: Payer: Self-pay | Admitting: Endocrinology

## 2015-05-16 ENCOUNTER — Other Ambulatory Visit (INDEPENDENT_AMBULATORY_CARE_PROVIDER_SITE_OTHER): Payer: Medicare Other

## 2015-05-16 ENCOUNTER — Ambulatory Visit (INDEPENDENT_AMBULATORY_CARE_PROVIDER_SITE_OTHER): Payer: Medicare Other | Admitting: Endocrinology

## 2015-05-16 VITALS — BP 140/82 | HR 60 | Temp 98.4°F | Ht 71.0 in | Wt 200.0 lb

## 2015-05-16 DIAGNOSIS — Z794 Long term (current) use of insulin: Secondary | ICD-10-CM

## 2015-05-16 DIAGNOSIS — R8290 Unspecified abnormal findings in urine: Secondary | ICD-10-CM | POA: Diagnosis not present

## 2015-05-16 DIAGNOSIS — E118 Type 2 diabetes mellitus with unspecified complications: Secondary | ICD-10-CM | POA: Diagnosis not present

## 2015-05-16 DIAGNOSIS — E7800 Pure hypercholesterolemia, unspecified: Secondary | ICD-10-CM

## 2015-05-16 DIAGNOSIS — I10 Essential (primary) hypertension: Secondary | ICD-10-CM | POA: Diagnosis not present

## 2015-05-16 DIAGNOSIS — E1142 Type 2 diabetes mellitus with diabetic polyneuropathy: Secondary | ICD-10-CM

## 2015-05-16 DIAGNOSIS — M25562 Pain in left knee: Secondary | ICD-10-CM

## 2015-05-16 DIAGNOSIS — Z23 Encounter for immunization: Secondary | ICD-10-CM | POA: Diagnosis not present

## 2015-05-16 DIAGNOSIS — M179 Osteoarthritis of knee, unspecified: Secondary | ICD-10-CM | POA: Insufficient documentation

## 2015-05-16 DIAGNOSIS — M1710 Unilateral primary osteoarthritis, unspecified knee: Secondary | ICD-10-CM | POA: Insufficient documentation

## 2015-05-16 DIAGNOSIS — Z125 Encounter for screening for malignant neoplasm of prostate: Secondary | ICD-10-CM | POA: Diagnosis not present

## 2015-05-16 DIAGNOSIS — Z0000 Encounter for general adult medical examination without abnormal findings: Secondary | ICD-10-CM

## 2015-05-16 HISTORY — DX: Pain in left knee: M25.562

## 2015-05-16 LAB — MICROALBUMIN / CREATININE URINE RATIO
Creatinine,U: 83.6 mg/dL
Microalb Creat Ratio: 7.2 mg/g (ref 0.0–30.0)
Microalb, Ur: 6 mg/dL — ABNORMAL HIGH (ref 0.0–1.9)

## 2015-05-16 LAB — URINALYSIS, ROUTINE W REFLEX MICROSCOPIC
Bilirubin Urine: NEGATIVE
Ketones, ur: NEGATIVE
Leukocytes, UA: NEGATIVE
Nitrite: NEGATIVE
SPECIFIC GRAVITY, URINE: 1.02 (ref 1.000–1.030)
TOTAL PROTEIN, URINE-UPE24: NEGATIVE
Urobilinogen, UA: 0.2 (ref 0.0–1.0)
pH: 5.5 (ref 5.0–8.0)

## 2015-05-16 LAB — BASIC METABOLIC PANEL
BUN: 25 mg/dL — ABNORMAL HIGH (ref 6–23)
CO2: 28 mEq/L (ref 19–32)
Calcium: 9.9 mg/dL (ref 8.4–10.5)
Chloride: 101 mEq/L (ref 96–112)
Creatinine, Ser: 1.08 mg/dL (ref 0.40–1.50)
GFR: 72.93 mL/min (ref >60.00)
Glucose, Bld: 286 mg/dL — ABNORMAL HIGH (ref 70–99)
Potassium: 4.7 mEq/L (ref 3.5–5.1)
Sodium: 137 mEq/L (ref 135–145)

## 2015-05-16 LAB — CBC WITH DIFFERENTIAL/PLATELET
Basophils Absolute: 0.1 10*3/uL (ref 0.0–0.1)
Basophils Relative: 0.8 % (ref 0.0–3.0)
Eosinophils Absolute: 0.1 10*3/uL (ref 0.0–0.7)
Eosinophils Relative: 1.8 % (ref 0.0–5.0)
HCT: 42.7 % (ref 39.0–52.0)
Hemoglobin: 14.4 g/dL (ref 13.0–17.0)
Lymphocytes Relative: 37.4 % (ref 12.0–46.0)
Lymphs Abs: 2.5 10*3/uL (ref 0.7–4.0)
MCHC: 33.7 g/dL (ref 30.0–36.0)
MCV: 88 fl (ref 78.0–100.0)
Monocytes Absolute: 0.5 10*3/uL (ref 0.1–1.0)
Monocytes Relative: 7.4 % (ref 3.0–12.0)
Neutro Abs: 3.6 10*3/uL (ref 1.4–7.7)
Neutrophils Relative %: 52.6 % (ref 43.0–77.0)
Platelets: 229 10*3/uL (ref 150.0–400.0)
RBC: 4.85 Mil/uL (ref 4.22–5.81)
RDW: 13.4 % (ref 11.5–15.5)
WBC: 6.8 10*3/uL (ref 4.0–10.5)

## 2015-05-16 LAB — HEPATIC FUNCTION PANEL
ALT: 15 U/L (ref 0–53)
AST: 20 U/L (ref 0–37)
Albumin: 4.7 g/dL (ref 3.5–5.2)
Alkaline Phosphatase: 72 U/L (ref 39–117)
BILIRUBIN DIRECT: 0.1 mg/dL (ref 0.0–0.3)
BILIRUBIN TOTAL: 0.4 mg/dL (ref 0.2–1.2)
Total Protein: 8 g/dL (ref 6.0–8.3)

## 2015-05-16 LAB — POCT GLYCOSYLATED HEMOGLOBIN (HGB A1C): Hemoglobin A1C: 8.3

## 2015-05-16 LAB — LDL CHOLESTEROL, DIRECT: LDL DIRECT: 160 mg/dL

## 2015-05-16 LAB — LIPID PANEL
Cholesterol: 265 mg/dL — ABNORMAL HIGH (ref 0–200)
HDL: 38 mg/dL — ABNORMAL LOW (ref >39.00)
Total CHOL/HDL Ratio: 7
Triglycerides: 492 mg/dL — ABNORMAL HIGH (ref 0.0–149.0)

## 2015-05-16 LAB — URIC ACID: Uric Acid, Serum: 9.2 mg/dL — ABNORMAL HIGH (ref 4.0–7.8)

## 2015-05-16 LAB — TSH: TSH: 1.48 u[IU]/mL (ref 0.35–4.50)

## 2015-05-16 LAB — PSA, MEDICARE: PSA: 8.88 ng/mL — AB (ref 0.10–4.00)

## 2015-05-16 LAB — SEDIMENTATION RATE: Sed Rate: 14 mm/hr (ref 0–22)

## 2015-05-16 MED ORDER — INSULIN NPH ISOPHANE & REGULAR (70-30) 100 UNIT/ML ~~LOC~~ SUSP: 35.0000 [IU] | Freq: Two times a day (BID) | SUBCUTANEOUS | Status: DC

## 2015-05-16 MED ORDER — TRAMADOL-ACETAMINOPHEN 37.5-325 MG PO TABS: 1.0000 | ORAL_TABLET | Freq: Four times a day (QID) | ORAL | Status: DC | PRN

## 2015-05-16 NOTE — Patient Instructions (Addendum)
blood tests are requested for you today.  We'll let you know about the results.  Please change the insulin to 35 units twice a day (just before first and last meals). please consider these measures for your health:  minimize alcohol.  do not use tobacco products.  have a colonoscopy at least every 10 years from age 65. keep firearms safely stored.  always use seat belts.  have working smoke alarms in your home.  see an eye doctor and dentist regularly.  never drive under the influence of alcohol or drugs (including prescription drugs).  those with fair skin should take precautions against the sun. it is critically important to prevent falling down (keep floor areas well-lit, dry, and free of loose objects.  If you have a cane, walker, or wheelchair, you should use it, even for short trips around the house.  Also, try not to rush).   Please come back for a follow-up appointment in 3 months.

## 2015-05-16 NOTE — Progress Notes (Signed)
Subjective:    Patient ID: Scott Donaldson, male    DOB: Dec 16, 1950, 64 y.o.   MRN: [id number]  HPI Pt returns for f/u of diabetes mellitus: DM type: insulin-requiring type 2 [id number] Complications: polyneuropathy and foot ulcer Therapy: insulin DKA: never Severe hypoglycemia: never. Pancreatitis: never Other: he needs a simple insulin regimen, due to ongoing noncompliance.  Interval history: no cbg record, but states cbg's vary from 100-200's.  It is in general higher as the day goes on.  He says he does not miss the insulin.  He takes none in am and 60 units qpm.   Past Medical History  Diagnosis Date  . DIABETES MELLITUS, TYPE II 03/04/2007  . HYPERCHOLESTEROLEMIA 07/29/2008  . HYPERTENSION 03/04/2007  . Psoriasis     Past Surgical History  Procedure Laterality Date  . Stress cardiolite  05/30/2003    Social History   Social History  . Marital Status: Single    Spouse Name: N/A  . Number of Children: N/A  . Years of Education: N/A   Occupational History  . Not on file.   Social History Main Topics  . Smoking status: Former Smoker    Quit date: 07/26/1988  . Smokeless tobacco: Not on file  . Alcohol Use: Not on file  . Drug Use: Not on file  . Sexual Activity: Not on file   Other Topics Concern  . Not on file   Social History Narrative    Current Outpatient Prescriptions on File Prior to Visit  Medication Sig Dispense Refill  . aspirin 325 MG tablet Take 325 mg by mouth daily.      . simvastatin (ZOCOR) 80 MG tablet Take 1 tablet (80 mg total) by mouth at bedtime. 90 tablet 3   No current facility-administered medications on file prior to visit.    No Known Allergies  Family History  Problem Relation Age of Onset  . Cancer Mother     Breast Cancer  . Cancer Father     uncertain type  . Cancer Brother 50    Colon Cancer    BP 140/82 mmHg  Pulse 60  Temp(Src) 98.4 F (36.9 C) (Oral)  Ht 5\' 11"  (1.803 m)  Wt 200 lb (90.719 kg)  BMI 27.91  kg/m2  SpO2 97%  Review of Systems Pt states chronic left knee pain (no injury).      Objective:   Physical Exam VITAL SIGNS:  See vs page GENERAL: no distress NECK: There is no palpable thyroid enlargement.  No thyroid nodule is palpable.  No palpable lymphadenopathy at the anterior neck. LUNGS:  Clear to auscultation HEART:  Regular rate and rhythm without murmurs noted. Normal S1,S2.   Left knee: normal Pulses: dorsalis pedis intact bilat.   MSK: no deformity of the feet CV: no leg edema Skin:  no ulcer on the feet.  normal color and temp on the feet. Neuro: sensation is intact to touch on the feet, but decreased from normal Ext: There is bilateral onychomycosis of the toenails.     A1c=8.3%    Assessment & Plan:  Knee pain, new, uncertain etiology.  he declines x-ray.   DM: therapy limited by noncompliance.  he needs increased rx.  Please change the insulin to 35 units twice a day (just before first and last meals).   blood tests are requested for you today.  We'll let you know about the results.   Subjective:   Patient here for Medicare annual wellness visit and management  of other chronic and acute problems.    Risk factors: advanced age    85 of Physicians Providing Medical Care to Patient:  See "snapshot"   Activities of Daily Living: In your present state of health, do you have any difficulty performing the following activities?:  Preparing food and eating?: No  Bathing yourself: No  Getting dressed: No  Using the toilet:No  Moving around from place to place: No  In the past year have you fallen or had a near fall?: No    Home Safety: Has smoke detector and wears seat belts. Firearms are safely stored. No excess sun exposure.  Diet and Exercise  Current exercise habits: as limited by knee pain Dietary issues discussed: pt reports a healthy diet   Depression Screen  Q1: Over the past two weeks, have you felt down, depressed or hopeless? no  Q2: Over  the past two weeks, have you felt little interest or pleasure in doing things? no   The following portions of the patient's history were reviewed and updated as appropriate: allergies, current medications, past family history, past medical history, past social history, past surgical history and problem list.   Review of Systems  Denies hearing loss, and visual loss Objective:   Vision:  Sees opthalmologist Hearing: grossly normal.  Body mass index:  See vs page Msk: pt easily and quickly performs "get-up-and-go" from a sitting position Cognitive Impairment Assessment: cognition, memory and judgment appear normal.  remembers 3/3 at 5 minutes.  excellent recall.  can easily read and write a sentence.  alert and oriented x 3   Assessment:   Medicare wellness utd on preventive parameters    Plan:   During the course of the visit the patient was educated and counseled about appropriate screening and preventive services including:        Fall prevention   Diabetes screening  Nutrition counseling   Vaccines / LABS Zostavax / Pneumococcal Vaccine  today  PSA  Patient Instructions (the written plan) was given to the patient.

## 2015-05-16 NOTE — Progress Notes (Signed)
we discussed code status.  pt requests full code, but would not want to be started or maintained on artificial life-support measures if there was not a reasonable chance of recovery 

## 2015-05-16 NOTE — Telephone Encounter (Signed)
i would not know the answer to this.  If it has been received here, I would imagine it would be in the refrigerator.

## 2015-05-19 ENCOUNTER — Ambulatory Visit: Payer: Self-pay | Admitting: Endocrinology

## 2015-05-19 ENCOUNTER — Other Ambulatory Visit: Payer: Self-pay | Admitting: Endocrinology

## 2015-05-19 DIAGNOSIS — R972 Elevated prostate specific antigen [PSA]: Secondary | ICD-10-CM

## 2015-05-19 HISTORY — DX: Elevated prostate specific antigen (PSA): R97.20

## 2015-05-19 MED ORDER — SIMVASTATIN 80 MG PO TABS: 80.0000 mg | ORAL_TABLET | Freq: Every day | ORAL | Status: DC

## 2015-05-19 MED ORDER — ALLOPURINOL 100 MG PO TABS: 100.0000 mg | ORAL_TABLET | Freq: Every day | ORAL | Status: DC

## 2015-05-19 NOTE — Telephone Encounter (Signed)
Left voicemail for the pt's daughter advising we have not received any patient assistance at this time.

## 2015-05-21 ENCOUNTER — Encounter: Payer: Self-pay | Admitting: Endocrinology

## 2015-07-07 DIAGNOSIS — R972 Elevated prostate specific antigen [PSA]: Secondary | ICD-10-CM | POA: Diagnosis not present

## 2015-08-15 ENCOUNTER — Ambulatory Visit: Payer: Medicare Other | Admitting: Endocrinology

## 2015-08-19 DIAGNOSIS — R972 Elevated prostate specific antigen [PSA]: Secondary | ICD-10-CM | POA: Diagnosis not present

## 2015-08-26 DIAGNOSIS — R972 Elevated prostate specific antigen [PSA]: Secondary | ICD-10-CM | POA: Diagnosis not present

## 2015-08-26 DIAGNOSIS — Z0000 Encounter for general adult medical examination without abnormal findings: Secondary | ICD-10-CM | POA: Diagnosis not present

## 2015-10-22 ENCOUNTER — Telehealth: Payer: Self-pay

## 2015-10-22 NOTE — Telephone Encounter (Signed)
Left message on machine for patient to return call to me regarding flu shot survey/call

## 2016-03-12 ENCOUNTER — Telehealth: Payer: Self-pay | Admitting: Endocrinology

## 2016-03-12 NOTE — Telephone Encounter (Signed)
Patient's daughter would like a return call ASAP concerning the patient's right arm going numb.  She needs to know if she should bring him in or take him to a hospital.

## 2016-03-12 NOTE — Telephone Encounter (Signed)
I contacted the pt's daughter and advised unfortunately we do not have the ability to add pt's on to Dr. Loanne Drilling scheduled today due to our supervisor being out of town and Md's schedule being full today. Pt's daughter advised to seek care at a Urgent care or ED.  She voiced understanding. (FYI)

## 2016-04-06 DIAGNOSIS — H524 Presbyopia: Secondary | ICD-10-CM | POA: Diagnosis not present

## 2016-04-06 DIAGNOSIS — H26493 Other secondary cataract, bilateral: Secondary | ICD-10-CM | POA: Diagnosis not present

## 2016-04-06 DIAGNOSIS — E113393 Type 2 diabetes mellitus with moderate nonproliferative diabetic retinopathy without macular edema, bilateral: Secondary | ICD-10-CM | POA: Diagnosis not present

## 2016-04-15 DIAGNOSIS — H3582 Retinal ischemia: Secondary | ICD-10-CM | POA: Diagnosis not present

## 2016-04-15 DIAGNOSIS — E113311 Type 2 diabetes mellitus with moderate nonproliferative diabetic retinopathy with macular edema, right eye: Secondary | ICD-10-CM | POA: Diagnosis not present

## 2016-04-15 DIAGNOSIS — E113392 Type 2 diabetes mellitus with moderate nonproliferative diabetic retinopathy without macular edema, left eye: Secondary | ICD-10-CM | POA: Diagnosis not present

## 2016-04-15 DIAGNOSIS — H43811 Vitreous degeneration, right eye: Secondary | ICD-10-CM | POA: Diagnosis not present

## 2016-05-13 DIAGNOSIS — E113311 Type 2 diabetes mellitus with moderate nonproliferative diabetic retinopathy with macular edema, right eye: Secondary | ICD-10-CM | POA: Diagnosis not present

## 2016-05-13 DIAGNOSIS — H3582 Retinal ischemia: Secondary | ICD-10-CM | POA: Diagnosis not present

## 2016-05-13 DIAGNOSIS — E113392 Type 2 diabetes mellitus with moderate nonproliferative diabetic retinopathy without macular edema, left eye: Secondary | ICD-10-CM | POA: Diagnosis not present

## 2016-05-13 DIAGNOSIS — H43811 Vitreous degeneration, right eye: Secondary | ICD-10-CM | POA: Diagnosis not present

## 2016-06-24 DIAGNOSIS — E113311 Type 2 diabetes mellitus with moderate nonproliferative diabetic retinopathy with macular edema, right eye: Secondary | ICD-10-CM | POA: Diagnosis not present

## 2016-06-24 DIAGNOSIS — E113392 Type 2 diabetes mellitus with moderate nonproliferative diabetic retinopathy without macular edema, left eye: Secondary | ICD-10-CM | POA: Diagnosis not present

## 2016-06-24 DIAGNOSIS — H3582 Retinal ischemia: Secondary | ICD-10-CM | POA: Diagnosis not present

## 2016-06-24 DIAGNOSIS — H43811 Vitreous degeneration, right eye: Secondary | ICD-10-CM | POA: Diagnosis not present

## 2016-08-01 NOTE — Progress Notes (Signed)
Subjective:    Patient ID: Scott Donaldson, male    DOB: 1951-04-06, 66 y.o.   MRN: [id number]  HPI Pt is here for regular wellness examination, and is feeling pretty well in general, and says chronic med probs are stable, except as noted below. Past Medical History:  Diagnosis Date  . DIABETES MELLITUS, TYPE II 03/04/2007  . HYPERCHOLESTEROLEMIA 07/29/2008  . HYPERTENSION 03/04/2007  . Psoriasis     Past Surgical History:  Procedure Laterality Date  . stress cardiolite  05/30/2003    Social History   Social History  . Marital status: Single    Spouse name: N/A  . Number of children: N/A  . Years of education: N/A   Occupational History  . Not on file.   Social History Main Topics  . Smoking status: Former Smoker    Quit date: 07/26/1988  . Smokeless tobacco: Not on file  . Alcohol use Not on file  . Drug use: Unknown  . Sexual activity: Not on file   Other Topics Concern  . Not on file   Social History Narrative  . No narrative on file    Current Outpatient Prescriptions on File Prior to Visit  Medication Sig Dispense Refill  . aspirin 325 MG tablet Take 325 mg by mouth daily.       No current facility-administered medications on file prior to visit.     No Known Allergies  Family History  Problem Relation Age of Onset  . Cancer Mother     Breast Cancer  . Cancer Father     uncertain type  . Cancer Brother 50    Colon Cancer    BP 136/64   Pulse (!) 58   Ht 5\' 11"  (1.803 m)   Wt 196 lb (88.9 kg)   SpO2 96%   BMI 27.34 kg/m     Review of Systems  Constitutional: Negative for fever.  HENT: Negative for trouble swallowing.   Eyes: Negative for photophobia.  Respiratory: Positive for wheezing.   Cardiovascular: Positive for chest pain. Negative for leg swelling.  Gastrointestinal: Positive for nausea. Negative for anal bleeding.  Endocrine: Negative for cold intolerance.  Genitourinary: Positive for difficulty urinating. Negative for  hematuria.  Musculoskeletal: Negative for neck stiffness.  Skin: Negative for rash.  Allergic/Immunologic: Negative for environmental allergies.  Neurological: Negative for syncope.  Hematological: Does not bruise/bleed easily.  Psychiatric/Behavioral: Negative for sleep disturbance.      Objective:   Physical Exam VS: see vs page GEN: no distress HEAD: head: no deformity eyes: no periorbital swelling, no proptosis external nose and ears are normal mouth: no lesion seen NECK: supple, thyroid is not enlarged CHEST WALL: no deformity LUNGS: clear to auscultation BREASTS:  No gynecomastia CV: reg rate and rhythm, no murmur.  ABD: abdomen is soft, nontender.  no hepatosplenomegaly.  not distended.  no hernia.   RECTAL/PROSTATE: sees urology MUSCULOSKELETAL: muscle bulk and strength are grossly normal.  no obvious joint swelling.  gait is normal and steady   PULSES: no carotid bruit NEURO:  cn 2-12 grossly intact.   readily moves all 4's.  SKIN:  Normal texture and temperature.  No rash or suspicious lesion is visible.   NODES:  None palpable at the neck PSYCH: alert, well-oriented.  Does not appear anxious nor depressed.  I personally reviewed electrocardiogram tracing (today): Indication: DM Impression: Sinus  Bradycardia -Left axis -anterior fascicular block.  -  Nonspecific T-abnormality. Compared to: 2015: no change.  Assessment & Plan:  cologuard sent  Subjective:   Patient here for Medicare annual wellness visit and management of other chronic and acute problems.     Risk factors: advanced age    43 of Physicians Providing Medical Care to Patient:  See "snapshot"   Activities of Daily Living: In your present state of health, do you have any difficulty performing the following activities?:  Preparing food and eating?: No  Bathing yourself: No  Getting dressed: No  Using the toilet:No  Moving around from place to place: No  In the past year have you  fallen or had a near fall?: No    Home Safety: Has smoke detector and wears seat belts. Firearms are safely stored. No excess sun exposure.   Diet and Exercise  Current exercise habits: pt says good Dietary issues discussed: pt says diet is not good.   Depression Screen  Q1: Over the past two weeks, have you felt down, depressed or hopeless? no  Q2: Over the past two weeks, have you felt little interest or pleasure in doing things? no   The following portions of the patient's history were reviewed and updated as appropriate: allergies, current medications, past family history, past medical history, past social history, past surgical history and problem list.   Review of Systems  Denies hearing loss, and visual loss Objective:   Vision:  Advertising account executive, so he declines VA today.  Hearing: grossly normal Body mass index:  See vs page Msk: pt easily and quickly performs "get-up-and-go" from a sitting position.   Cognitive Impairment Assessment: cognition, memory and judgment appear normal.   remembers 2/3 at 5 minutes (? effort).  excellent recall.  can easily read and write a sentence.  alert and oriented x 3.     Assessment:   Medicare wellness utd on preventive parameters    Plan:   During the course of the visit the patient was educated and counseled about appropriate screening and preventive services including:        Fall prevention   Diabetes screening  Nutrition counseling   Vaccines / LABS Zostavax / Pneumococcal Vaccine  today  PSA  Patient Instructions (the written plan) was given to the patient.      SEPARATE EVALUATION FOLLOWS--EACH PROBLEM HERE IS NEW, NOT RESPONDING TO TREATMENT, OR POSES SIGNIFICANT RISK TO THE PATIENT'S HEALTH: HISTORY OF THE PRESENT ILLNESS: Pt returns for f/u of diabetes mellitus: DM type: insulin-requiring type 2 [id number] Complications: polyneuropathy and foot ulcer Therapy: insulin DKA: never Severe hypoglycemia:  never. Pancreatitis: never Other: he needs a simple insulin regimen, due to ongoing noncompliance.  Interval history: he does not check cbg's. He sometimes misses the insulin  Pt states 6 weeks of moderate prob-quality cough in the chest, and assoc headache. PAST MEDICAL HISTORY Past Medical History:  Diagnosis Date  . DIABETES MELLITUS, TYPE II 03/04/2007  . HYPERCHOLESTEROLEMIA 07/29/2008  . HYPERTENSION 03/04/2007  . Psoriasis     Past Surgical History:  Procedure Laterality Date  . stress cardiolite  05/30/2003    Social History   Social History  . Marital status: Single    Spouse name: N/A  . Number of children: N/A  . Years of education: N/A   Occupational History  . Not on file.   Social History Main Topics  . Smoking status: Former Smoker    Quit date: 07/26/1988  . Smokeless tobacco: Not on file  . Alcohol use Not on file  . Drug use: Unknown  .  Sexual activity: Not on file   Other Topics Concern  . Not on file   Social History Narrative  . No narrative on file    Current Outpatient Prescriptions on File Prior to Visit  Medication Sig Dispense Refill  . aspirin 325 MG tablet Take 325 mg by mouth daily.       No current facility-administered medications on file prior to visit.     No Known Allergies  Family History  Problem Relation Age of Onset  . Cancer Mother     Breast Cancer  . Cancer Father     uncertain type  . Cancer Brother 50    Colon Cancer    BP 136/64   Pulse (!) 58   Ht 5\' 11"  (1.803 m)   Wt 196 lb (88.9 kg)   SpO2 96%   BMI 27.34 kg/m   REVIEW OF SYSTEMS: Denies sob.  He has intermittent generalized abd pain.  He denies hypoglycemia PHYSICAL EXAMINATION: VITAL SIGNS:  See vs page GENERAL: no distress Pulses: dorsalis pedis intact bilat.   MSK: no deformity of the feet CV: no leg edema Skin:  no ulcer on the feet.  normal color and temp on the feet. Neuro: sensation is intact to touch on the feet Ext: There is bilateral  onychomycosis of the toenails LAB/XRAY RESULTS: A1c=9.9% IMPRESSION: abd pain, new, uncertain etiology. Acute bronchitis, new Insulin-requiring type 2 DM: therapy limited by noncompliance.     PLAN:  Check [place] and labs.   I have sent a prescription to your pharmacy, for an antibiotic pill. We discussed risks of noncompliance.  Pt says he'll work on not missing insulin.

## 2016-08-04 ENCOUNTER — Ambulatory Visit
Admission: RE | Admit: 2016-08-04 | Discharge: 2016-08-04 | Disposition: A | Discharge status: Home / Self Care | Payer: Medicare Other | Source: Ambulatory Visit | Attending: Endocrinology | Admitting: Endocrinology

## 2016-08-04 ENCOUNTER — Ambulatory Visit (INDEPENDENT_AMBULATORY_CARE_PROVIDER_SITE_OTHER): Payer: Medicare Other | Admitting: Endocrinology

## 2016-08-04 ENCOUNTER — Encounter: Payer: Self-pay | Admitting: Endocrinology

## 2016-08-04 VITALS — BP 136/64 | HR 58 | Ht 71.0 in | Wt 196.0 lb

## 2016-08-04 DIAGNOSIS — Z794 Long term (current) use of insulin: Secondary | ICD-10-CM

## 2016-08-04 DIAGNOSIS — R109 Unspecified abdominal pain: Secondary | ICD-10-CM

## 2016-08-04 DIAGNOSIS — R059 Cough, unspecified: Secondary | ICD-10-CM

## 2016-08-04 DIAGNOSIS — R05 Cough: Secondary | ICD-10-CM | POA: Diagnosis not present

## 2016-08-04 DIAGNOSIS — Z125 Encounter for screening for malignant neoplasm of prostate: Secondary | ICD-10-CM | POA: Diagnosis not present

## 2016-08-04 DIAGNOSIS — E1142 Type 2 diabetes mellitus with diabetic polyneuropathy: Secondary | ICD-10-CM | POA: Diagnosis not present

## 2016-08-04 DIAGNOSIS — Z0000 Encounter for general adult medical examination without abnormal findings: Secondary | ICD-10-CM

## 2016-08-04 DIAGNOSIS — R0602 Shortness of breath: Secondary | ICD-10-CM | POA: Diagnosis not present

## 2016-08-04 DIAGNOSIS — R1031 Right lower quadrant pain: Secondary | ICD-10-CM | POA: Insufficient documentation

## 2016-08-04 DIAGNOSIS — R1030 Lower abdominal pain, unspecified: Secondary | ICD-10-CM | POA: Insufficient documentation

## 2016-08-04 DIAGNOSIS — Z23 Encounter for immunization: Secondary | ICD-10-CM | POA: Diagnosis not present

## 2016-08-04 HISTORY — DX: Unspecified abdominal pain: R10.9

## 2016-08-04 LAB — BASIC METABOLIC PANEL
BUN: 20 mg/dL (ref 6–23)
CALCIUM: 9.2 mg/dL (ref 8.4–10.5)
CHLORIDE: 102 meq/L (ref 96–112)
CO2: 24 meq/L (ref 19–32)
Creatinine, Ser: 0.87 mg/dL (ref 0.40–1.50)
GFR: 93.25 mL/min (ref >60.00)
GLUCOSE: 258 mg/dL — AB (ref 70–99)
POTASSIUM: 4.3 meq/L (ref 3.5–5.1)
SODIUM: 137 meq/L (ref 135–145)

## 2016-08-04 LAB — CBC WITH DIFFERENTIAL/PLATELET
BASOS ABS: 0.1 10*3/uL (ref 0.0–0.1)
Basophils Relative: 1 % (ref 0.0–3.0)
EOS ABS: 0.2 10*3/uL (ref 0.0–0.7)
Eosinophils Relative: 4 % (ref 0.0–5.0)
HEMATOCRIT: 38 % — AB (ref 39.0–52.0)
HEMOGLOBIN: 13 g/dL (ref 13.0–17.0)
LYMPHS PCT: 38.5 % (ref 12.0–46.0)
Lymphs Abs: 1.9 10*3/uL (ref 0.7–4.0)
MCHC: 34.2 g/dL (ref 30.0–36.0)
MCV: 84.6 fl (ref 78.0–100.0)
MONO ABS: 0.3 10*3/uL (ref 0.1–1.0)
Monocytes Relative: 6.9 % (ref 3.0–12.0)
Neutro Abs: 2.5 10*3/uL (ref 1.4–7.7)
Neutrophils Relative %: 49.6 % (ref 43.0–77.0)
Platelets: 194 10*3/uL (ref 150.0–400.0)
RBC: 4.49 Mil/uL (ref 4.22–5.81)
RDW: 13.6 % (ref 11.5–15.5)
WBC: 5 10*3/uL (ref 4.0–10.5)

## 2016-08-04 LAB — URINALYSIS, ROUTINE W REFLEX MICROSCOPIC
BILIRUBIN URINE: NEGATIVE
HGB URINE DIPSTICK: NEGATIVE
Ketones, ur: NEGATIVE
LEUKOCYTES UA: NEGATIVE
NITRITE: NEGATIVE
PH: 5.5 (ref 5.0–8.0)
Specific Gravity, Urine: 1.025 (ref 1.000–1.030)
Total Protein, Urine: NEGATIVE
UROBILINOGEN UA: 0.2 (ref 0.0–1.0)
Urine Glucose: 500 — AB

## 2016-08-04 LAB — LIPID PANEL
CHOLESTEROL: 274 mg/dL — AB (ref 0–200)
HDL: 35.8 mg/dL — ABNORMAL LOW (ref >39.00)
Total CHOL/HDL Ratio: 8

## 2016-08-04 LAB — HEPATIC FUNCTION PANEL
ALBUMIN: 4.1 g/dL (ref 3.5–5.2)
ALT: 11 U/L (ref 0–53)
AST: 14 U/L (ref 0–37)
Alkaline Phosphatase: 65 U/L (ref 39–117)
BILIRUBIN TOTAL: 0.4 mg/dL (ref 0.2–1.2)
Bilirubin, Direct: 0.1 mg/dL (ref 0.0–0.3)
Total Protein: 7.1 g/dL (ref 6.0–8.3)

## 2016-08-04 LAB — PSA: PSA: 6.44 ng/mL — ABNORMAL HIGH (ref 0.10–4.00)

## 2016-08-04 LAB — HEPATITIS C ANTIBODY: HCV AB: NEGATIVE

## 2016-08-04 LAB — MICROALBUMIN / CREATININE URINE RATIO
Creatinine,U: 128.2 mg/dL
MICROALB/CREAT RATIO: 4.6 mg/g (ref 0.0–30.0)
Microalb, Ur: 5.9 mg/dL — ABNORMAL HIGH (ref 0.0–1.9)

## 2016-08-04 LAB — TSH: TSH: 1.18 u[IU]/mL (ref 0.35–4.50)

## 2016-08-04 LAB — POCT GLYCOSYLATED HEMOGLOBIN (HGB A1C): HEMOGLOBIN A1C: 9.9

## 2016-08-04 LAB — LDL CHOLESTEROL, DIRECT: LDL DIRECT: 134 mg/dL

## 2016-08-04 MED ORDER — CEFUROXIME AXETIL 250 MG PO TABS: 250.0000 mg | ORAL_TABLET | Freq: Two times a day (BID) | ORAL | 0 refills | Status: AC

## 2016-08-04 MED ORDER — ROSUVASTATIN CALCIUM 5 MG PO TABS: 5.0000 mg | ORAL_TABLET | Freq: Every day | ORAL | 3 refills | Status: DC

## 2016-08-04 NOTE — Progress Notes (Signed)
we discussed code status.  pt requests full code, but would not want to be started or maintained on artificial life-support measures if there was not a reasonable chance of recovery 

## 2016-08-04 NOTE — Patient Instructions (Addendum)
good diet and exercise significantly improve the control of your diabetes.  please let me know if you wish to be referred to a dietician.  high blood sugar is very risky to your health.  you should see an eye doctor and dentist every year.  It is very important to get all recommended vaccinations.  Controlling your blood pressure and cholesterol drastically reduces the damage diabetes does to your body.  Those who smoke should quit.  Please discuss these with your doctor.  Please consider these measures for your health:  minimize alcohol.  Do not use tobacco products.  Have a colonoscopy at least every 10 years from age 21.  Keep firearms safely stored.  Always use seat belts.  have working smoke alarms in your home.  See an eye doctor and dentist regularly.  Never drive under the influence of alcohol or drugs (including prescription drugs).  Those with fair skin should take precautions against the sun, and should carefully examine their skin once per month, for any new or changed moles. It is critically important to prevent falling down (keep floor areas well-lit, dry, and free of loose objects.  If you have a cane, walker, or wheelchair, you should use it, even for short trips around the house.  Wear flat-soled shoes.  Also, try not to rush). Make a big push to never miss the insulin.  check your blood sugar twice a day.  vary the time of day when you check, between before the 3 meals, and at bedtime.  also check if you have symptoms of your blood sugar being too high or too low.  please keep a record of the readings and bring it to your next appointment here (or you can bring the meter itself).  You can write it on any piece of paper.  please call us sooner if your blood sugar goes below 70, or if you have a lot of readings over 200. blood tests,and a chest x-ray, are requested for you today.  We'll let you know about the results.   Let's check an ultrasound.  you will receive a phone call, about a day and  time for an appointment.   Please come back for a follow-up appointment in 2 months.

## 2016-08-05 ENCOUNTER — Telehealth: Payer: Self-pay | Admitting: Endocrinology

## 2016-08-05 DIAGNOSIS — H3582 Retinal ischemia: Secondary | ICD-10-CM | POA: Diagnosis not present

## 2016-08-05 DIAGNOSIS — E113392 Type 2 diabetes mellitus with moderate nonproliferative diabetic retinopathy without macular edema, left eye: Secondary | ICD-10-CM | POA: Diagnosis not present

## 2016-08-05 DIAGNOSIS — H43811 Vitreous degeneration, right eye: Secondary | ICD-10-CM | POA: Diagnosis not present

## 2016-08-05 DIAGNOSIS — E113311 Type 2 diabetes mellitus with moderate nonproliferative diabetic retinopathy with macular edema, right eye: Secondary | ICD-10-CM | POA: Diagnosis not present

## 2016-08-05 LAB — HM DIABETES EYE EXAM

## 2016-08-05 MED ORDER — INSULIN NPH ISOPHANE & REGULAR (70-30) 100 UNIT/ML ~~LOC~~ SUSP: SUBCUTANEOUS | 2 refills | Status: DC

## 2016-08-05 NOTE — Telephone Encounter (Signed)
Pt needs his insulin refilled to the [facility] on [address]

## 2016-08-05 NOTE — Telephone Encounter (Signed)
Refill submitted. 

## 2016-08-10 ENCOUNTER — Other Ambulatory Visit: Payer: Medicare Other

## 2016-08-16 ENCOUNTER — Ambulatory Visit
Admission: RE | Admit: 2016-08-16 | Discharge: 2016-08-16 | Disposition: A | Discharge status: Home / Self Care | Payer: Medicare Other | Source: Ambulatory Visit | Attending: Endocrinology | Admitting: Endocrinology

## 2016-08-16 DIAGNOSIS — R109 Unspecified abdominal pain: Secondary | ICD-10-CM

## 2016-08-16 DIAGNOSIS — R1084 Generalized abdominal pain: Secondary | ICD-10-CM | POA: Diagnosis not present

## 2016-08-22 ENCOUNTER — Encounter: Payer: Self-pay | Admitting: Endocrinology

## 2016-08-22 DIAGNOSIS — Z1212 Encounter for screening for malignant neoplasm of rectum: Secondary | ICD-10-CM | POA: Diagnosis not present

## 2016-08-22 DIAGNOSIS — Z1211 Encounter for screening for malignant neoplasm of colon: Secondary | ICD-10-CM | POA: Diagnosis not present

## 2016-08-26 NOTE — Telephone Encounter (Signed)
Scott Donaldson is asking for [place] to call her in case we cannot get in touch with the pt

## 2016-08-26 NOTE — Telephone Encounter (Signed)
I contacted the patient's daughter and advised ultrasound results from 08/16/2016 were normal and to have the patient call if his pain is persistent. She voiced understanding and will advise the patient of results.

## 2016-08-27 ENCOUNTER — Encounter: Payer: Self-pay | Admitting: Endocrinology

## 2016-08-27 ENCOUNTER — Ambulatory Visit (INDEPENDENT_AMBULATORY_CARE_PROVIDER_SITE_OTHER): Payer: Medicare Other | Admitting: Endocrinology

## 2016-08-27 ENCOUNTER — Telehealth: Payer: Self-pay | Admitting: Endocrinology

## 2016-08-27 VITALS — BP 112/62 | HR 65 | Temp 98.1°F | Ht 71.0 in | Wt 200.0 lb

## 2016-08-27 DIAGNOSIS — R059 Cough, unspecified: Secondary | ICD-10-CM

## 2016-08-27 DIAGNOSIS — R05 Cough: Secondary | ICD-10-CM

## 2016-08-27 LAB — COLOGUARD

## 2016-08-27 MED ORDER — PROMETHAZINE-CODEINE 6.25-10 MG/5ML PO SYRP: 5.0000 mL | ORAL_SOLUTION | ORAL | 0 refills | Status: AC | PRN

## 2016-08-27 MED ORDER — AZITHROMYCIN 500 MG PO TABS: 500.0000 mg | ORAL_TABLET | Freq: Every day | ORAL | 0 refills | Status: DC

## 2016-08-27 MED ORDER — FLUTICASONE-SALMETEROL 100-50 MCG/DOSE IN AEPB: 1.0000 | INHALATION_SPRAY | Freq: Two times a day (BID) | RESPIRATORY_TRACT | 3 refills | Status: DC

## 2016-08-27 NOTE — Telephone Encounter (Signed)
Patient scheduled for 2:45 this afternoon.  

## 2016-08-27 NOTE — Telephone Encounter (Signed)
Given 3 weeks time, please advise ov this afternoon

## 2016-08-27 NOTE — Telephone Encounter (Signed)
Pt's daughter called in and said that the head cold that he came in about three weeks ago is still not going away.  She wanted to know if has to come in again or if another antibiotic can be called in.

## 2016-08-27 NOTE — Telephone Encounter (Signed)
See message and please advise, Thanks!  

## 2016-08-27 NOTE — Progress Notes (Signed)
Subjective:    Patient ID: Scott Donaldson, male    DOB: Jun 02, 1951, 66 y.o.   MRN: [id number]  HPI  Pt returns for f/u of diabetes mellitus: DM type: insulin-requiring type 2 [id number] Complications: polyneuropathy and foot ulcer Therapy: insulin DKA: never Severe hypoglycemia: never. Pancreatitis: never Other: he needs a simple insulin regimen, due to ongoing noncompliance.  Interval history: no cbg record, but states cbg's are well-controlled.  He says he does not miss the insulin. Grandson (who lives with pt), had pos flu test this am.  Pt says he fels somewhat better after being rx'ed abx 3 weeks ago.  However, he now has 4 days of moderate prod-quality cough in the chest, and assoc wheezing.   Past Medical History:  Diagnosis Date  . DIABETES MELLITUS, TYPE II 03/04/2007  . HYPERCHOLESTEROLEMIA 07/29/2008  . HYPERTENSION 03/04/2007  . Psoriasis     Past Surgical History:  Procedure Laterality Date  . stress cardiolite  05/30/2003    Social History   Social History  . Marital status: Single    Spouse name: N/A  . Number of children: N/A  . Years of education: N/A   Occupational History  . Not on file.   Social History Main Topics  . Smoking status: Former Smoker    Quit date: 07/26/1988  . Smokeless tobacco: Never Used  . Alcohol use Not on file  . Drug use: Unknown  . Sexual activity: Not on file   Other Topics Concern  . Not on file   Social History Narrative  . No narrative on file    Current Outpatient Prescriptions on File Prior to Visit  Medication Sig Dispense Refill  . aspirin 325 MG tablet Take 325 mg by mouth daily.      . insulin NPH-regular Human (NOVOLIN 70/30) (70-30) 100 UNIT/ML injection 35-40 units daily Monday-Friday; 30 units in the am and 40 units in the pm on the weekends. 30 mL 2  . rosuvastatin (CRESTOR) 5 MG tablet Take 1 tablet (5 mg total) by mouth daily. 90 tablet 3   No current facility-administered medications on file prior  to visit.     No Known Allergies  Family History  Problem Relation Age of Onset  . Cancer Mother     Breast Cancer  . Cancer Father     uncertain type  . Cancer Brother 50    Colon Cancer    BP 112/62   Pulse 65   Temp 98.1 F (36.7 C) (Oral)   Ht 5\' 11"  (1.803 m)   Wt 200 lb (90.7 kg)   SpO2 97%   BMI 27.89 kg/m   Review of Systems He has nasal congestion, but no fever.     Objective:   Physical Exam VITAL SIGNS:  See vs page GENERAL: no distress head: no deformity  eyes: no periorbital swelling, no proptosis  external nose and ears are normal  mouth: no lesion seen Both tm's are red. LUNGS:  Clear to auscultation.      Assessment & Plan:  Insulin-requiring type 2 DM, with foot ulcer: apparently well-controlled.  Acute bronchitis, new to me.    Patient is advised the following: Patient Instructions  Please continue the same insulin.  I have sent 2 prescriptions to your pharmacy: for a different antibiotic, and an inhaler.  Here is a prescription, for cough syrup.   check your blood sugar twice a day.  vary the time of day when you check, between before the  3 meals, and at bedtime.  also check if you have symptoms of your blood sugar being too high or too low.  please keep a record of the readings and bring it to your next appointment here (or you can bring the meter itself).  You can write it on any piece of paper.  please call us sooner if your blood sugar goes below 70, or if you have a lot of readings over 200.  I hope you feel better soon.  If you don't feel better by late next week, please call back.  Please call sooner if you get worse. Please come back for a follow-up appointment next month as scheduled.

## 2016-08-27 NOTE — Patient Instructions (Addendum)
Please continue the same insulin.  I have sent 2 prescriptions to your pharmacy: for a different antibiotic, and an inhaler.  Here is a prescription, for cough syrup.   check your blood sugar twice a day.  vary the time of day when you check, between before the 3 meals, and at bedtime.  also check if you have symptoms of your blood sugar being too high or too low.  please keep a record of the readings and bring it to your next appointment here (or you can bring the meter itself).  You can write it on any piece of paper.  please call us sooner if your blood sugar goes below 70, or if you have a lot of readings over 200.  I hope you feel better soon.  If you don't feel better by late next week, please call back.  Please call sooner if you get worse. Please come back for a follow-up appointment next month as scheduled.

## 2016-08-30 ENCOUNTER — Telehealth: Payer: Self-pay | Admitting: Endocrinology

## 2016-08-30 DIAGNOSIS — D126 Benign neoplasm of colon, unspecified: Secondary | ICD-10-CM

## 2016-08-30 NOTE — Telephone Encounter (Signed)
I contacted the patient's daughter and advised of message. She voiced understanding and will advise the patient of the results and referral.

## 2016-08-30 NOTE — Telephone Encounter (Signed)
please call patient: cologuard is pos.  This does not mean you have cancer--just that you should see the specialist.

## 2016-09-02 ENCOUNTER — Emergency Department (HOSPITAL_COMMUNITY)
Admission: EM | Admit: 2016-09-02 | Discharge: 2016-09-02 | Disposition: A | Discharge status: Home / Self Care | Payer: Medicare Other | Attending: Emergency Medicine | Admitting: Emergency Medicine

## 2016-09-02 ENCOUNTER — Encounter (HOSPITAL_COMMUNITY): Payer: Self-pay

## 2016-09-02 DIAGNOSIS — I10 Essential (primary) hypertension: Secondary | ICD-10-CM | POA: Insufficient documentation

## 2016-09-02 DIAGNOSIS — Z7982 Long term (current) use of aspirin: Secondary | ICD-10-CM | POA: Diagnosis not present

## 2016-09-02 DIAGNOSIS — Z794 Long term (current) use of insulin: Secondary | ICD-10-CM | POA: Diagnosis not present

## 2016-09-02 DIAGNOSIS — H9011 Conductive hearing loss, unilateral, right ear, with unrestricted hearing on the contralateral side: Secondary | ICD-10-CM | POA: Diagnosis not present

## 2016-09-02 DIAGNOSIS — W228XXA Striking against or struck by other objects, initial encounter: Secondary | ICD-10-CM | POA: Insufficient documentation

## 2016-09-02 DIAGNOSIS — S0921XA Traumatic rupture of right ear drum, initial encounter: Secondary | ICD-10-CM | POA: Diagnosis not present

## 2016-09-02 DIAGNOSIS — E119 Type 2 diabetes mellitus without complications: Secondary | ICD-10-CM | POA: Insufficient documentation

## 2016-09-02 DIAGNOSIS — Y999 Unspecified external cause status: Secondary | ICD-10-CM | POA: Insufficient documentation

## 2016-09-02 DIAGNOSIS — Z87891 Personal history of nicotine dependence: Secondary | ICD-10-CM | POA: Insufficient documentation

## 2016-09-02 DIAGNOSIS — Y939 Activity, unspecified: Secondary | ICD-10-CM | POA: Insufficient documentation

## 2016-09-02 DIAGNOSIS — S0991XA Unspecified injury of ear, initial encounter: Secondary | ICD-10-CM | POA: Diagnosis present

## 2016-09-02 DIAGNOSIS — Y929 Unspecified place or not applicable: Secondary | ICD-10-CM | POA: Diagnosis not present

## 2016-09-02 DIAGNOSIS — H9221 Otorrhagia, right ear: Secondary | ICD-10-CM | POA: Diagnosis not present

## 2016-09-02 MED ORDER — TETANUS-DIPHTH-ACELL PERTUSSIS 5-2.5-18.5 LF-MCG/0.5 IM SUSP
0.5000 mL | Freq: Once | INTRAMUSCULAR | Status: AC
  Administered 2016-09-02: 0.5 mL via INTRAMUSCULAR
  Filled 2016-09-02: qty 0.5

## 2016-09-02 MED ORDER — HYDROCODONE-ACETAMINOPHEN 5-325 MG PO TABS: ORAL_TABLET | ORAL | 0 refills | Status: DC

## 2016-09-02 NOTE — ED Notes (Signed)
Bed: WA19 Expected date:  Expected time:  Means of arrival:  Comments: 

## 2016-09-02 NOTE — Discharge Instructions (Signed)
Try to refrain from getting water in the affected ear.  you will have to make an appointment with the ear nose and throat doctor and also for audiology testing.  Take vicodin for breakthrough pain, do not drink alcohol, drive, care for children or do other critical tasks while taking vicodin.  Please follow with your primary care doctor in the next 2 days for a check-up. They must obtain records for further management.   Do not hesitate to return to the Emergency Department for any new, worsening or concerning symptoms.

## 2016-09-02 NOTE — ED Provider Notes (Signed)
[facility] DEPT Provider Note   CSN: [id number] Arrival date & time: 09/02/16  0759     History   Chief Complaint Chief Complaint  Patient presents with  . Ear Injury  . hearing loss     HPI   Blood pressure 185/76, pulse (!) 57, temperature 98.1 F (36.7 C), temperature source Oral, resp. rate 18, SpO2 100 %.  KYEL CLAXTON is a 66 y.o. male complaining of Trauma to right ear, he was walking by a [place] and he thinks that her branch went into the ear canal, bleeding from the area with reduced hearing, he is in no pain. Last tetanus shot is unknown. Patient is not anticoagulated.  Past Medical History:  Diagnosis Date  . DIABETES MELLITUS, TYPE II 03/04/2007  . HYPERCHOLESTEROLEMIA 07/29/2008  . HYPERTENSION 03/04/2007  . Psoriasis     Patient Active Problem List   Diagnosis Date Noted  . Cough 08/04/2016  . Abdominal pain 08/04/2016  . Abnormal PSA 05/19/2015  . Knee pain, left 05/16/2015  . Foot ulcer, right ([place]) 04/15/2014  . Loss of vision 04/15/2014  . Screening for prostate cancer 11/03/2012  . Routine general medical examination at a health care facility 11/03/2012  . Arthralgia 11/12/2010  . SHOULDER PAIN, BILATERAL 01/12/2010  . 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 HYPERCHOLESTEROLEMIA 07/29/2008  . CELLULITIS, FOOT, RIGHT 07/02/2008  . EDEMA 07/02/2008  . Diabetes ([place]) 03/04/2007  . Essential hypertension 03/04/2007    Past Surgical History:  Procedure Laterality Date  . stress cardiolite  05/30/2003       Home Medications    Prior to Admission medications   Medication Sig Start Date End Date Taking? Authorizing Provider  aspirin 325 MG tablet Take 325 mg by mouth daily.      Historical Provider, MD  azithromycin (ZITHROMAX) 500 MG tablet Take 1 tablet (500 mg total) by mouth daily. 08/27/16   Renato Shin, MD  Fluticasone-Salmeterol (ADVAIR) 100-50 MCG/DOSE AEPB Inhale 1 puff into the lungs 2 (two) times daily. 08/27/16   Renato Shin,  MD  insulin NPH-regular Human (NOVOLIN 70/30) (70-30) 100 UNIT/ML injection 35-40 units daily Monday-Friday; 30 units in the am and 40 units in the pm on the weekends. 08/05/16   Renato Shin, MD  promethazine-codeine (PHENERGAN WITH CODEINE) 6.25-10 MG/5ML syrup Take 5 mLs by mouth every 4 (four) hours as needed for cough. 08/27/16 09/06/16  Renato Shin, MD  rosuvastatin (CRESTOR) 5 MG tablet Take 1 tablet (5 mg total) by mouth daily. 08/04/16   Renato Shin, MD    Family History Family History  Problem Relation Age of Onset  . Cancer Mother     Breast Cancer  . Cancer Father     uncertain type  . Cancer Brother 52    Colon Cancer    Social History Social History  Substance Use Topics  . Smoking status: Former Smoker    Quit date: 07/26/1988  . Smokeless tobacco: Never Used  . Alcohol use No     Allergies   Patient has no known allergies.   Review of Systems Review of Systems  10 systems reviewed and found to be negative, except as noted in the HPI.   Physical Exam Updated Vital Signs BP 185/76 (BP Location: Left Arm)   Pulse (!) 57   Temp 98.1 F (36.7 C) (Oral)   Resp 18   SpO2 100%   Physical Exam  Constitutional: He is oriented to person, place, and time. He appears well-developed and well-nourished.  No distress.  HENT:  Head: Normocephalic and atraumatic.  Mouth/Throat: Oropharynx is clear and moist.  Dried blood on the right earlobe and in the right outer ear canal, tympanic membrane appears be perforated at around 5:00.  Eyes: Conjunctivae and EOM are normal. Pupils are equal, round, and reactive to light.  Neck: Normal range of motion.  Cardiovascular: Normal rate, regular rhythm and intact distal pulses.   Pulmonary/Chest: Effort normal and breath sounds normal.  Abdominal: Soft. There is no tenderness.  Musculoskeletal: Normal range of motion.  Neurological: He is alert and oriented to person, place, and time.  Skin: He is not diaphoretic.    Psychiatric: He has a normal mood and affect.  Nursing note and vitals reviewed.    ED Treatments / Results  Labs (all labs ordered are listed, but only abnormal results are displayed) Labs Reviewed - No data to display  EKG  EKG Interpretation None       Radiology No results found.  Procedures Procedures (including critical care time)  Medications Ordered in ED Medications  Tdap (BOOSTRIX) injection 0.5 mL (0.5 mLs Intramuscular Given 09/02/16 0926)     Initial Impression / Assessment and Plan / ED Course  I have reviewed the triage vital signs and the nursing notes.  Pertinent labs & imaging results that were available during my care of the patient were reviewed by me and considered in my medical decision making (see chart for details).     Vitals:   09/02/16 0811  BP: 185/76  Pulse: (!) 57  Resp: 18  Temp: 98.1 F (36.7 C)  TempSrc: Oral  SpO2: 100%    Medications  Tdap (BOOSTRIX) injection 0.5 mL (0.5 mLs Intramuscular Given 09/02/16 0926)    MERLON VOZZA is 66 y.o. male presenting with Manic perforation of right tympanic membrane. Decreased hearing, patient is in no pain. This is updated. She given referral to audiology and ENT.  Discussed case with attending physician who agrees with care plan and disposition.   Evaluation does not show pathology that would require ongoing emergent intervention or inpatient treatment. Pt is hemodynamically stable and mentating appropriately. Discussed findings and plan with patient/guardian, who agrees with care plan. All questions answered. Return precautions discussed and outpatient follow up given.      Final Clinical Impressions(s) / ED Diagnoses   Final diagnoses:  None    New Prescriptions New Prescriptions   No medications on file     Monico Blitz, PA-C 09/02/16 [place], MD 09/02/16 1030

## 2016-09-02 NOTE — ED Triage Notes (Signed)
Pt was going around truck this morning.  Stick poked in rt ear.  Pt cannot hear out of ear.  Bleeding noted.

## 2016-10-01 ENCOUNTER — Telehealth: Payer: Self-pay | Admitting: Endocrinology

## 2016-10-01 ENCOUNTER — Encounter: Payer: Self-pay | Admitting: Endocrinology

## 2016-10-01 ENCOUNTER — Ambulatory Visit (INDEPENDENT_AMBULATORY_CARE_PROVIDER_SITE_OTHER): Payer: Medicare Other | Admitting: Endocrinology

## 2016-10-01 VITALS — BP 143/80 | HR 63 | Ht 71.0 in | Wt 199.0 lb

## 2016-10-01 DIAGNOSIS — Z9114 Patient's other noncompliance with medication regimen: Secondary | ICD-10-CM

## 2016-10-01 DIAGNOSIS — Z9119 Patient's noncompliance with other medical treatment and regimen: Secondary | ICD-10-CM | POA: Diagnosis not present

## 2016-10-01 DIAGNOSIS — Z794 Long term (current) use of insulin: Secondary | ICD-10-CM | POA: Diagnosis not present

## 2016-10-01 DIAGNOSIS — E1142 Type 2 diabetes mellitus with diabetic polyneuropathy: Secondary | ICD-10-CM | POA: Diagnosis not present

## 2016-10-01 LAB — POCT GLYCOSYLATED HEMOGLOBIN (HGB A1C): HEMOGLOBIN A1C: 10.9

## 2016-10-01 MED ORDER — INSULIN DEGLUDEC 100 UNIT/ML ~~LOC~~ SOPN: 70.0000 [IU] | PEN_INJECTOR | Freq: Every day | SUBCUTANEOUS | 11 refills | Status: DC

## 2016-10-01 MED ORDER — INSULIN GLARGINE 100 UNIT/ML SOLOSTAR PEN: 70.0000 [IU] | PEN_INJECTOR | SUBCUTANEOUS | 99 refills | Status: DC

## 2016-10-01 NOTE — Patient Instructions (Addendum)
I have sent a prescription to your pharmacy, to change the insulin to "tresiba."  This you do not have to take with a meal. check your blood sugar twice a day.  vary the time of day when you check, between before the 3 meals, and at bedtime.  also check if you have symptoms of your blood sugar being too high or too low.  please keep a record of the readings and bring it to your next appointment here (or you can bring the meter itself).  You can write it on any piece of paper.  please call us sooner if your blood sugar goes below 70, or if you have a lot of readings over 200.  Please consider these measures for your health:  minimize alcohol.  Do not use tobacco products.  Have a colonoscopy at least every 10 years from age 65.  Keep firearms safely stored.  Always use seat belts.  have working smoke alarms in your home.  See an eye doctor and dentist regularly.  Never drive under the influence of alcohol or drugs (including prescription drugs).  Those with fair skin should take precautions against the sun, and should carefully examine their skin once per month, for any new or changed moles. It is critically important to prevent falling down (keep floor areas well-lit, dry, and free of loose objects.  If you have a cane, walker, or wheelchair, you should use it, even for short trips around the house.  Wear flat-soled shoes.  Also, try not to rush).  Please come back for a follow-up appointment in 2 months.

## 2016-10-01 NOTE — Progress Notes (Addendum)
Subjective:    Patient ID: Scott Donaldson, male    DOB: 11/11/1950, 66 y.o.   MRN: [id number]  HPI Pt returns for f/u of diabetes mellitus: DM type: insulin-requiring type 2 [id number] Complications: polyneuropathy and foot ulcer.  Therapy: insulin DKA: never. Severe hypoglycemia: never. Pancreatitis: never Other: he needs a simple insulin regimen, due to ongoing noncompliance.  Interval history: He says he often misses the insulin, due to skipping breakfast.  pt states he feels well in general.  Past Medical History:  Diagnosis Date  . DIABETES MELLITUS, TYPE II 03/04/2007  . HYPERCHOLESTEROLEMIA 07/29/2008  . HYPERTENSION 03/04/2007  . Psoriasis     Past Surgical History:  Procedure Laterality Date  . stress cardiolite  05/30/2003    Social History   Social History  . Marital status: Single    Spouse name: N/A  . Number of children: N/A  . Years of education: N/A   Occupational History  . Not on file.   Social History Main Topics  . Smoking status: Former Smoker    Quit date: 07/26/1988  . Smokeless tobacco: Never Used  . Alcohol use No  . Drug use: No  . Sexual activity: Not on file   Other Topics Concern  . Not on file   Social History Narrative  . No narrative on file    Current Outpatient Prescriptions on File Prior to Visit  Medication Sig Dispense Refill  . aspirin 325 MG tablet Take 325 mg by mouth daily.      . Fluticasone-Salmeterol (ADVAIR) 100-50 MCG/DOSE AEPB Inhale 1 puff into the lungs 2 (two) times daily. 1 each 3  . HYDROcodone-acetaminophen (NORCO/VICODIN) 5-325 MG tablet Take 1-2 tablets by mouth every 6 hours as needed for pain and/or cough. 11 tablet 0  . rosuvastatin (CRESTOR) 5 MG tablet Take 1 tablet (5 mg total) by mouth daily. 90 tablet 3   No current facility-administered medications on file prior to visit.     No Known Allergies  Family History  Problem Relation Age of Onset  . Cancer Mother     Breast Cancer  . Cancer  Father     uncertain type  . Cancer Brother 50    Colon Cancer    BP (!) 143/80   Pulse 63   Ht 5\' 11"  (1.803 m)   Wt 199 lb (90.3 kg)   SpO2 97%   BMI 27.75 kg/m   Review of Systems He denies hypoglycemia.     Objective:   Physical Exam VITAL SIGNS:  See vs page.  GENERAL: no distress.  Pulses: dorsalis pedis intact bilat.   MSK: no deformity of the feet.  CV: no leg edema.  Skin:  no ulcer on the feet.  normal color and temp on the feet.  Neuro: sensation is intact to touch on the feet.  Ext: There is bilateral onychomycosis of the toenails.   A1c=10.9%     Assessment & Plan:  Insulin-requiring type 2 DM, with polyneuropathy: worse. Noncompliance with cbg recording and insulin.  I have sent a prescription, to change the insulin to "tresiba."  This he does not have to take with a meal, so this is simplification of his schedule.    Subjective:   Patient here for Medicare annual wellness visit and management of other chronic and acute problems.     Risk factors: advanced age    60 of Physicians Providing Medical Care to Patient:  See "snapshot"   Activities of Daily Living:  In your present state of health, do you have any difficulty performing the following activities (lives with dtr)?:  Preparing food and eating?: No  Bathing yourself: No  Getting dressed: No  Using the toilet:No  Moving around from place to place: No  In the past year have you fallen or had a near fall?: No    Home Safety: Has smoke detector and wears seat belts. Firearms are safely stored. No excess sun exposure.   Diet and Exercise  Current exercise habits: pt says good Dietary issues discussed: pt reports a healthy diet   Depression Screen  Q1: Over the past two weeks, have you felt down, depressed or hopeless? no  Q2: Over the past two weeks, have you felt little interest or pleasure in doing things? no   The following portions of the patient's history were reviewed and updated  as appropriate: allergies, current medications, past family history, past medical history, past social history, past surgical history and problem list.   Review of Systems  Denies hearing loss, and visual loss.  Objective:   Vision:  Advertising account executive, so he declines VA today.  Hearing: grossly normal.  Body mass index:  See vs page.  Msk: pt easily and quickly performs "get-up-and-go" from a sitting position.  Cognitive Impairment Assessment: cognition, memory and judgment appear normal.  remembers 3/3 at 5 minutes.  excellent recall.  can easily read and write a sentence.  alert and oriented x 3.     Assessment:   Medicare wellness utd on preventive parameters    Plan:   During the course of the visit the patient was educated and counseled about appropriate screening and preventive services including:       Fall prevention    Diabetes screening  Nutrition counseling   Vaccines / LABS Zostavax / Pneumococcal Vaccine  today  PSA  Patient Instructions (the written plan) was given to the patient.

## 2016-10-01 NOTE — Telephone Encounter (Signed)
please call pcc: What is status of GI referral?

## 2016-10-01 NOTE — Telephone Encounter (Signed)
Patient was contacted on 09/06/2016 from GI to schedule his appt. Patient was advised to call the GI dept back to schedule his appt. CB number is [phone].

## 2016-10-01 NOTE — Telephone Encounter (Signed)
Patient was advised to call back GI dept to schedule appointment.

## 2016-10-01 NOTE — Telephone Encounter (Signed)
please call patient: Please reschedule GI appt.  This important, because the cologuard test was positive.

## 2016-10-01 NOTE — Progress Notes (Signed)
we discussed code status.  pt requests full code, but would not want to be started or maintained on artificial life-support measures if there was not a reasonable chance of recovery 

## 2016-10-05 DIAGNOSIS — S0921XD Traumatic rupture of right ear drum, subsequent encounter: Secondary | ICD-10-CM | POA: Diagnosis not present

## 2016-10-08 ENCOUNTER — Encounter: Payer: Self-pay | Admitting: Endocrinology

## 2016-10-08 ENCOUNTER — Telehealth: Payer: Self-pay | Admitting: Endocrinology

## 2016-10-08 NOTE — Telephone Encounter (Signed)
please call patient: It is really important to see gastroenterology, because of the abnormal cologuard test.  Please let us know.

## 2016-10-11 NOTE — Telephone Encounter (Signed)
I contacted the patient's daughter and advised of message. She voiced understanding and stated she would call the GI department today.

## 2016-10-21 ENCOUNTER — Encounter: Payer: Self-pay | Admitting: Endocrinology

## 2016-11-02 ENCOUNTER — Telehealth: Payer: Self-pay | Admitting: Endocrinology

## 2016-11-02 NOTE — Telephone Encounter (Signed)
Certified letter (11/27/16) mailed on 10/26/16.  Patient received and sign the receipt on October 29, 2016. DAJ

## 2016-12-01 ENCOUNTER — Encounter: Payer: Self-pay | Admitting: Neurology

## 2016-12-01 ENCOUNTER — Ambulatory Visit (INDEPENDENT_AMBULATORY_CARE_PROVIDER_SITE_OTHER): Payer: Medicare Other | Admitting: Endocrinology

## 2016-12-01 VITALS — BP 142/76 | HR 56 | Wt 192.0 lb

## 2016-12-01 DIAGNOSIS — R519 Headache, unspecified: Secondary | ICD-10-CM

## 2016-12-01 DIAGNOSIS — R51 Headache: Secondary | ICD-10-CM | POA: Diagnosis not present

## 2016-12-01 DIAGNOSIS — E1142 Type 2 diabetes mellitus with diabetic polyneuropathy: Secondary | ICD-10-CM

## 2016-12-01 DIAGNOSIS — Z794 Long term (current) use of insulin: Secondary | ICD-10-CM

## 2016-12-01 HISTORY — DX: Headache, unspecified: R51.9

## 2016-12-01 LAB — POCT GLYCOSYLATED HEMOGLOBIN (HGB A1C): HEMOGLOBIN A1C: 10.2

## 2016-12-01 MED ORDER — INSULIN GLARGINE 100 UNIT/ML SOLOSTAR PEN
80.0000 [IU] | PEN_INJECTOR | SUBCUTANEOUS | 99 refills | Status: DC
Start: 2016-12-01 — End: 2017-01-18

## 2016-12-01 NOTE — Progress Notes (Signed)
Subjective:    Patient ID: Scott Donaldson, male    DOB: [birth year], 66 y.o.   MRN: [id number]  HPI Pt returns for f/u of diabetes mellitus: DM type: insulin-requiring type 2.   [id number] Complications: polyneuropathy and foot ulcer.  Therapy: insulin DKA: never. Severe hypoglycemia: never. Pancreatitis: never Other: he needs a simple insulin regimen, due to ongoing noncompliance; ins declined tresiba. Interval history: He has frequent mild hypoglycemia at lunch.  no cbg record.  He checks fasting only.   He has 1 year of intermitt headache, worst at the right temporal area.  sxs are worse x a few mos.  No assoc n/v Past Medical History:  Diagnosis Date  . DIABETES MELLITUS, TYPE II 03/04/2007  . HYPERCHOLESTEROLEMIA 07/29/2008  . HYPERTENSION 03/04/2007  . Psoriasis     Past Surgical History:  Procedure Laterality Date  . stress cardiolite  05/30/2003    Social History   Social History  . Marital status: Single    Spouse name: N/A  . Number of children: N/A  . Years of education: N/A   Occupational History  . Not on file.   Social History Main Topics  . Smoking status: Former Smoker    Quit date: 07/26/1988  . Smokeless tobacco: Never Used  . Alcohol use No  . Drug use: No  . Sexual activity: Not on file   Other Topics Concern  . Not on file   Social History Narrative  . No narrative on file    Current Outpatient Prescriptions on File Prior to Visit  Medication Sig Dispense Refill  . aspirin 325 MG tablet Take 325 mg by mouth daily.      . Fluticasone-Salmeterol (ADVAIR) 100-50 MCG/DOSE AEPB Inhale 1 puff into the lungs 2 (two) times daily. 1 each 3  . rosuvastatin (CRESTOR) 5 MG tablet Take 1 tablet (5 mg total) by mouth daily. 90 tablet 3   No current facility-administered medications on file prior to visit.     No Known Allergies  Family History  Problem Relation Age of Onset  . Cancer Mother        Breast Cancer  . Cancer Father    uncertain type  . Cancer Brother 50       Colon Cancer    BP (!) 142/76   Pulse (!) 56   Wt 192 lb (87.1 kg)   SpO2 96%   BMI 26.78 kg/m   Review of Systems Denies LOC.  Denies visual loss.      Objective:   Physical Exam VITAL SIGNS:  See vs page.  GENERAL: no distress.  Pulses: dorsalis pedis intact bilat.   MSK: no deformity of the feet.  CV: no leg edema.  Skin:  no ulcer on the feet.  normal color and temp on the feet.  Neuro: sensation is intact to touch on the feet.  Ext: There is bilateral onychomycosis of the toenails.   a1c=10.2%    Assessment & Plan:  Insulin-requiring type 2 DM, with polyneuropathy.  He needs increased rx.  Headache, new, uncertain etiology.   Patient Instructions  check your blood sugar twice a day.  vary the time of day when you check, between before the 3 meals, and at bedtime.  also check if you have symptoms of your blood sugar being too high or too low.  please keep a record of the readings and bring it to your next appointment here (or you can bring the meter itself).  You can  write it on any piece of paper.  please call us sooner if your blood sugar goes below 70, or if you have a lot of readings over 200.  Please see a specialist, for your headache.  you will receive a phone call, about a day and time for an appointment. Please increase the insulin to 80 units each morning.  On this type of insulin schedule, you should eat meals on a regular schedule.  If a meal is missed or significantly delayed, your blood sugar could go low. Please come back for a follow-up appointment in 2 months.

## 2016-12-01 NOTE — Patient Instructions (Addendum)
check your blood sugar twice a day.  vary the time of day when you check, between before the 3 meals, and at bedtime.  also check if you have symptoms of your blood sugar being too high or too low.  please keep a record of the readings and bring it to your next appointment here (or you can bring the meter itself).  You can write it on any piece of paper.  please call us sooner if your blood sugar goes below 70, or if you have a lot of readings over 200.  Please see a specialist, for your headache.  you will receive a phone call, about a day and time for an appointment. Please increase the insulin to 80 units each morning.  On this type of insulin schedule, you should eat meals on a regular schedule.  If a meal is missed or significantly delayed, your blood sugar could go low. Please come back for a follow-up appointment in 2 months.

## 2016-12-10 ENCOUNTER — Ambulatory Visit (INDEPENDENT_AMBULATORY_CARE_PROVIDER_SITE_OTHER): Payer: Medicare Other | Admitting: Endocrinology

## 2016-12-10 ENCOUNTER — Encounter: Payer: Self-pay | Admitting: Endocrinology

## 2016-12-10 VITALS — BP 134/74 | HR 51 | Wt 195.0 lb

## 2016-12-10 DIAGNOSIS — E1142 Type 2 diabetes mellitus with diabetic polyneuropathy: Secondary | ICD-10-CM

## 2016-12-10 DIAGNOSIS — Z794 Long term (current) use of insulin: Secondary | ICD-10-CM | POA: Diagnosis not present

## 2016-12-10 LAB — HEPATIC FUNCTION PANEL
ALT: 13 U/L (ref 0–53)
AST: 16 U/L (ref 0–37)
Albumin: 4.6 g/dL (ref 3.5–5.2)
Alkaline Phosphatase: 66 U/L (ref 39–117)
BILIRUBIN TOTAL: 0.4 mg/dL (ref 0.2–1.2)
Bilirubin, Direct: 0.1 mg/dL (ref 0.0–0.3)
Total Protein: 7.7 g/dL (ref 6.0–8.3)

## 2016-12-10 MED ORDER — TERBINAFINE HCL 250 MG PO TABS: 250.0000 mg | ORAL_TABLET | Freq: Every day | ORAL | 0 refills | Status: DC

## 2016-12-10 MED ORDER — DOXYCYCLINE HYCLATE 100 MG PO TABS: 100.0000 mg | ORAL_TABLET | Freq: Two times a day (BID) | ORAL | 0 refills | Status: DC

## 2016-12-10 NOTE — Progress Notes (Signed)
   Subjective:    Patient ID: Scott Donaldson, male    DOB: Mar 11, 1951, 66 y.o.   MRN: [id number]  HPI 5 days ago, pt caught left great toe on a pants leg.  He has moderate pain there since then, and assoc swelling.   Past Medical History:  Diagnosis Date  . DIABETES MELLITUS, TYPE II 03/04/2007  . HYPERCHOLESTEROLEMIA 07/29/2008  . HYPERTENSION 03/04/2007  . Psoriasis     Past Surgical History:  Procedure Laterality Date  . stress cardiolite  05/30/2003    Social History   Social History  . Marital status: Single    Spouse name: N/A  . Number of children: N/A  . Years of education: N/A   Occupational History  . Not on file.   Social History Main Topics  . Smoking status: Former Smoker    Quit date: 07/26/1988  . Smokeless tobacco: Never Used  . Alcohol use No  . Drug use: No  . Sexual activity: Not on file   Other Topics Concern  . Not on file   Social History Narrative  . No narrative on file    Current Outpatient Prescriptions on File Prior to Visit  Medication Sig Dispense Refill  . aspirin 325 MG tablet Take 325 mg by mouth daily.      . Fluticasone-Salmeterol (ADVAIR) 100-50 MCG/DOSE AEPB Inhale 1 puff into the lungs 2 (two) times daily. 1 each 3  . Insulin Glargine (LANTUS SOLOSTAR) 100 UNIT/ML Solostar Pen Inject 80 Units into the skin every morning. And pen needles 1/day 10 pen PRN  . rosuvastatin (CRESTOR) 5 MG tablet Take 1 tablet (5 mg total) by mouth daily. 90 tablet 3   No current facility-administered medications on file prior to visit.     No Known Allergies  Family History  Problem Relation Age of Onset  . Cancer Mother        Breast Cancer  . Cancer Father        uncertain type  . Cancer Brother 50       Colon Cancer    BP 134/74 (BP Location: Right Arm, Patient Position: Sitting)   Pulse (!) 51   Wt 195 lb (88.5 kg)   SpO2 98%   BMI 27.20 kg/m    Review of Systems Denies fever, but he has slight drainage from the left great toe  paronychial area.      Objective:   Physical Exam VITAL SIGNS:  See vs page GENERAL: no distress Right great toe: There is onychomycosis of the toenail, which is loose. No erythema, but there is slight swelling and drainage.      Assessment & Plan:  Onychomycosis, persistent Paronychia, new, due to minor injury.  Patient Instructions  I have sent prescriptions to your pharmacy: antibiotic and for the toenail fungus.   blood tests are requested for you today.  We'll let you know about the results. Keep the toe covered with a large bandaid I hope you feel better soon.  If your toe gets worse please call back.

## 2016-12-10 NOTE — Patient Instructions (Addendum)
I have sent prescriptions to your pharmacy: antibiotic and for the toenail fungus.   blood tests are requested for you today.  We'll let you know about the results. Keep the toe covered with a large bandaid I hope you feel better soon.  If your toe gets worse please call back.

## 2017-01-18 ENCOUNTER — Telehealth: Payer: Self-pay

## 2017-01-18 ENCOUNTER — Telehealth: Payer: Self-pay | Admitting: Endocrinology

## 2017-01-18 MED ORDER — INSULIN GLARGINE 100 UNIT/ML SOLOSTAR PEN: 100.0000 [IU] | PEN_INJECTOR | SUBCUTANEOUS | 99 refills | Status: DC

## 2017-01-18 NOTE — Telephone Encounter (Signed)
[name] called to advise that the patient is currently not able to control his insulin. The last reading was 421. Yesterday was 471. Sunday read "high". She is wanting to discuss going back on the previous insulin, because it seemed to work better.

## 2017-01-18 NOTE — Telephone Encounter (Signed)
Called and notified patient of insulin change. Patient wife understood had no questions at this time.

## 2017-01-18 NOTE — Telephone Encounter (Signed)
Please advise. Thank you

## 2017-01-18 NOTE — Telephone Encounter (Signed)
All we need to do is to increase the insulin to the right amount.  I have sent a prescription to your pharmacy, to increase the insulin to 100 units qam (please verify now 80 units qam).  Please call us in a few days, to tell us how the blood sugar is doing

## 2017-02-14 ENCOUNTER — Ambulatory Visit (INDEPENDENT_AMBULATORY_CARE_PROVIDER_SITE_OTHER): Payer: Medicare Other | Admitting: Neurology

## 2017-02-14 ENCOUNTER — Encounter: Payer: Self-pay | Admitting: Neurology

## 2017-02-14 VITALS — BP 130/70 | HR 66 | Ht 71.0 in | Wt 204.1 lb

## 2017-02-14 DIAGNOSIS — R51 Headache: Secondary | ICD-10-CM

## 2017-02-14 DIAGNOSIS — M542 Cervicalgia: Secondary | ICD-10-CM

## 2017-02-14 DIAGNOSIS — G4486 Cervicogenic headache: Secondary | ICD-10-CM

## 2017-02-14 MED ORDER — TIZANIDINE HCL 2 MG PO TABS: ORAL_TABLET | ORAL | 0 refills | Status: DC

## 2017-02-14 NOTE — Progress Notes (Signed)
NEUROLOGY CONSULTATION NOTE  [name] MRN: [id number] DOB: 05-03-1951  Referring provider: Dr. Loanne Drilling Primary care provider: Dr. Loanne Drilling  Reason for consult:  Headache, neck pain  HISTORY OF PRESENT ILLNESS: Scott Donaldson is a 66 year old male with type 2 diabetes who presents for headache.  He is accompanied by his significant other who supplements history.  Onset:  At least 6 months ago Location:  Right sided from posterior to frontal Quality:  pounding Intensity:  7/10 Aura:  no Prodrome:  no Postdrome:  no Associated symptoms:  Photophobia.  No nausea, vomiting, phonophobia or visual disturbance.  She has not had any new worse headache of her life, waking up from sleep Duration:  2-3 days Frequency:  16 to 20 days per month. Frequency of abortive medication: once in a while (less than once a week) Triggers/exacerbating factors:  Neck movement Relieving factors:  Keeping neck still Activity:  Still functions.  Past NSAIDS:  no Past analgesics:  no Past abortive triptans:  no Past muscle relaxants:  no Past anti-emetic:  no Past antihypertensive medications:  no Past antidepressant medications:  no Past anticonvulsant medications:  no Other past therapies:  no  Current NSAIDS:  ASA 325mg  daily, ibuprofen (infrequently) Current analgesics:  no Current triptans:  no Current anti-emetic:  no Current muscle relaxants:  no Current anti-anxiolytic:  no Current sleep aide:  no Current Antihypertensive medications:  no Current Antidepressant medications:  no Current Anticonvulsant medications:  no Current Vitamins/Herbal/Supplements:  no Current Antihistamines/Decongestants:  no Other therapy:  no  Caffeine:  1 cup of coffee daily Alcohol:  no Smoker:  no Diet:  Needs to drink more water Exercise:  walks Depression/anxiety:  no Sleep hygiene:  varies Family history of headache:  No  Two months ago, he developed sharp constant moderate right  sided neck pain from behind the ear down to the upper lateral cervical region.  There is no radicular pain or numbness or weakness down the right arm.  Turning head to the left makes it worse.  Keeping neck still helps.  He denies any recent strain or injury.  12/10/16 Hepatic Panel:  Total bili 0.4, ALP 66, AST 16 and ALT 13  PAST MEDICAL HISTORY: Past Medical History:  Diagnosis Date  . DIABETES MELLITUS, TYPE II 03/04/2007  . HYPERCHOLESTEROLEMIA 07/29/2008  . HYPERTENSION 03/04/2007  . Psoriasis     PAST SURGICAL HISTORY: Past Surgical History:  Procedure Laterality Date  . stress cardiolite  05/30/2003    MEDICATIONS: Current Outpatient Prescriptions on File Prior to Visit  Medication Sig Dispense Refill  . aspirin 325 MG tablet Take 325 mg by mouth daily.      . Insulin Glargine (LANTUS SOLOSTAR) 100 UNIT/ML Solostar Pen Inject 100 Units into the skin every morning. And pen needles 1/day 15 pen PRN  . rosuvastatin (CRESTOR) 5 MG tablet Take 1 tablet (5 mg total) by mouth daily. 90 tablet 3   No current facility-administered medications on file prior to visit.     ALLERGIES: No Known Allergies  FAMILY HISTORY: Family History  Problem Relation Age of Onset  . Cancer Mother        Breast Cancer  . Cancer Father        uncertain type  . Cancer Brother 49       Colon Cancer    SOCIAL HISTORY: Social History   Social History  . Marital status: Single    Spouse name: N/A  . Number of  children: 2  . Years of education: 10   Occupational History  . roofer    Social History Main Topics  . Smoking status: Former Smoker    Quit date: 07/26/1988  . Smokeless tobacco: Never Used  . Alcohol use No  . Drug use: No  . Sexual activity: Not on file   Other Topics Concern  . Not on file   Social History Narrative   Lives with daughter in a one story home.  Has 2 children.  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  Education: 10th grade.     REVIEW OF SYSTEMS: Constitutional:  No fevers, chills, or sweats, no generalized fatigue, change in appetite Eyes: No visual changes, double vision, eye pain Ear, nose and throat: No hearing loss, ear pain, nasal congestion, sore throat Cardiovascular: No chest pain, palpitations Respiratory:  No shortness of breath at rest or with exertion, wheezes GastrointestinaI: No nausea, vomiting, diarrhea, abdominal pain, fecal incontinence Genitourinary:  No dysuria, urinary retention or frequency Musculoskeletal:  No neck pain, back pain Integumentary: No rash, pruritus, skin lesions Neurological: as above Psychiatric: No depression, insomnia, anxiety Endocrine: No palpitations, fatigue, diaphoresis, mood swings, change in appetite, change in weight, increased thirst Hematologic/Lymphatic:  No purpura, petechiae. Allergic/Immunologic: no itchy/runny eyes, nasal congestion, recent allergic reactions, rashes  PHYSICAL EXAM: Vitals:   02/14/17 0754  BP: 130/70  Pulse: 66   General: No acute distress.  Patient appears well-groomed.  Head:  Normocephalic/atraumatic Eyes:  fundi examined but not visualized Neck: supple, no paraspinal tenderness, right suboccipital tenderness, decreased range of motion to right side-bend and turn to the right. Back: No paraspinal tenderness Heart: regular rate and rhythm Lungs: Clear to auscultation bilaterally. Vascular: No carotid bruits. Neurological Exam: Mental status: alert and oriented to person, place, and time, recent and remote memory intact, fund of knowledge intact, attention and concentration intact, speech fluent and not dysarthric, language intact. Cranial nerves: CN I: not tested CN II: pupils equal, round and reactive to light, visual fields intact CN III, IV, VI:  full range of motion, no nystagmus, no ptosis CN V: facial sensation intact CN VII: upper and lower face symmetric CN VIII: hearing intact CN IX, X: gag intact, uvula midline CN XI: sternocleidomastoid and trapezius  muscles intact CN XII: tongue midline Bulk & Tone: normal, no fasciculations. Motor:  5/5 throughout  Sensation: temperature intact and vibration sensation reduced in feet. Deep Tendon Reflexes:  1+ throughout, toes downgoing. Finger to nose testing:  Without dysmetria.   Heel to shin:  Without dysmetria.   Gait:  Left limp due to knee pain.  Able to turn but not tandem walk. Romberg negative.  IMPRESSION: Cervicogenic headache Cervicalgia  PLAN: 1.  Refer to Sports Medicine for [facility] 2.  For acute neck pain, take tizanidine 1 to 2 tablets every 8 hours as needed.  It may help prevent onset of headache.  Caution for drowsiness. 3.  For headache, may take ibuprofen but limit to no more than 2 days out of the week to prevent rebound headache 4.  Try using a flat memory foam pillow.  Also, get a new mattress if it is greater than 44 years old. 5.  Follow up in 3 months.  If headaches not improved, will try a preventative (gabapentin, nortriptyline, etc).  Thank you for allowing me to take part in the care of this patient.  Metta Clines, DO  CC:  Renato Shin, MD

## 2017-02-14 NOTE — Patient Instructions (Addendum)
I think the headaches are coming from the neck as well.  So we need to treat the neck pain. 1.  I will refer you to Dr. Hulan Saas, a Sports Medicine doctor who treats neck and spine issues. 2.  For acute neck pain, take tizanidine 1 to 2 tablets every 8 hours as needed.  It may help prevent onset of headache.  Caution for drowsiness. 3.  For headache, may take ibuprofen but limit to no more than 2 days out of the week to prevent rebound headache 4.  Try using a flat memory foam pillow.  Also, get a new mattress if it is greater than 21 years old. 5.  Follow up in 3 months.

## 2017-02-28 NOTE — Progress Notes (Deleted)
[facility] [place], [place] [address] Phone: [phone] Subjective:    I'm seeing this patient by the request  of:  Renato Shin, MD Tomi Likens DO  CC: Neck pain  [id number]  Scott Donaldson is a 66 y.o. male coming in with complaint of neck pain. Patient has been diagnosed with a cervicogenic headaches. Patient has been given muscle relaxers, anti-inflammatory's, as well as tried changing pounds. Patient states  Onset-  Location Duration-  Character- Aggravating factors- Reliving factors-  Therapies tried-  Severity-     Past Medical History:  Diagnosis Date  . DIABETES MELLITUS, TYPE II 03/04/2007  . HYPERCHOLESTEROLEMIA 07/29/2008  . HYPERTENSION 03/04/2007  . Psoriasis    Past Surgical History:  Procedure Laterality Date  . stress cardiolite  05/30/2003   Social History   Social History  . Marital status: Single    Spouse name: N/A  . Number of children: 2  . Years of education: 10   Occupational History  . roofer    Social History Main Topics  . Smoking status: Former Smoker    Quit date: 07/26/1988  . Smokeless tobacco: Never Used  . Alcohol use No  . Drug use: No  . Sexual activity: Not on file   Other Topics Concern  . Not on file   Social History Narrative   Lives with daughter in a one story home.  Has 2 children.  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  Education: 10th grade.    No Known Allergies Family History  Problem Relation Age of Onset  . Cancer Mother        Breast Cancer  . Cancer Father        uncertain type  . Cancer Brother 67       Colon Cancer     Past medical history, social, surgical and family history all reviewed in electronic medical record.  No pertanent information unless stated regarding to the chief complaint.   Review of Systems:Review of systems updated and as accurate as of 02/28/17  No headache, visual changes, nausea, vomiting, diarrhea, constipation, dizziness,  abdominal pain, skin rash, fevers, chills, night sweats, weight loss, swollen lymph nodes, body aches, joint swelling, muscle aches, chest pain, shortness of breath, mood changes.   Objective  There were no vitals taken for this visit. Systems examined below as of 02/28/17   General: No apparent distress alert and oriented x3 mood and affect normal, dressed appropriately.  HEENT: Pupils equal, extraocular movements intact  Respiratory: Patient's speak in full sentences and does not appear short of breath  Cardiovascular: No lower extremity edema, non tender, no erythema  Skin: Warm dry intact with no signs of infection or rash on extremities or on axial skeleton.  Abdomen: Soft nontender  Neuro: Cranial nerves II through XII are intact, neurovascularly intact in all extremities with 2+ DTRs and 2+ pulses.  Lymph: No lymphadenopathy of posterior or anterior cervical chain or axillae bilaterally.  Gait normal with good balance and coordination.  MSK:  Non tender with full range of motion and good stability and symmetric strength and tone of shoulders, elbows, wrist, hip, knee and ankles bilaterally.  Neck: Inspection unremarkable. No palpable stepoffs. Negative Spurling's maneuver. Full neck range of motion Grip strength and sensation normal in bilateral hands Strength good C4 to T1 distribution No sensory change to C4 to T1 Negative Hoffman sign bilaterally Reflexes normal   Impression and Recommendations:     This  case required medical decision making of moderate complexity.      Note: This dictation was prepared with Dragon dictation along with smaller phrase technology. Any transcriptional errors that result from this process are unintentional.

## 2017-03-02 ENCOUNTER — Ambulatory Visit: Payer: Medicare Other | Admitting: Family Medicine

## 2017-03-22 ENCOUNTER — Telehealth: Payer: Self-pay | Admitting: Endocrinology

## 2017-03-22 NOTE — Telephone Encounter (Signed)
Patient's daughter called in reference to patient's Rx for Insulin Glargine (LANTUS SOLOSTAR) 100 UNIT/ML Solostar Pen. Patient is still having trouble with insulin and has stopped taking the insulin completley now. Patient took last insulin on Sunday 03/20/17. Please call patient's daughter and advise.

## 2017-03-22 NOTE — Telephone Encounter (Signed)
Routing to you °

## 2017-03-22 NOTE — Telephone Encounter (Signed)
Called daughter but got no answer.

## 2017-03-23 NOTE — Telephone Encounter (Signed)
Please tell us the problem, adnd we are happy to help you: too expensive, not helping, probs with the pen, or low blood sugar?

## 2017-03-23 NOTE — Telephone Encounter (Signed)
Routing to you °

## 2017-03-23 NOTE — Telephone Encounter (Signed)
Please advise. Patient has stopped taking Lantus since 03/20/17, she states she is having 'trouble'.  Thank you!

## 2017-03-23 NOTE — Telephone Encounter (Signed)
Patient's daughter called back to speak to nurse. Call to advise, okay to leave a detailed message on phone.

## 2017-03-24 ENCOUNTER — Other Ambulatory Visit: Payer: Self-pay

## 2017-03-24 MED ORDER — BASAGLAR KWIKPEN 100 UNIT/ML ~~LOC~~ SOPN: PEN_INJECTOR | SUBCUTANEOUS | 6 refills | Status: DC

## 2017-03-24 NOTE — Telephone Encounter (Signed)
If it is a copay issue, change to basaglar.  If it is a "donut hole" issue, change to NPH--same dosage.

## 2017-03-24 NOTE — Telephone Encounter (Signed)
Called patients daughter & she stated that lantus insulin is too expensive. Her dad refused to pay for it & was off insulin until last night. She bought him Novolin N & he took 40-50 units she believes. She was wondering about cheaper alternative & dosage? His blood sugar before NPH was 455.

## 2017-03-24 NOTE — Telephone Encounter (Signed)
Also maybe call in [place] instead of Lantus?

## 2017-03-24 NOTE — Telephone Encounter (Signed)
Called and spoke with daughter. She stated that it was a copay issue so I sent in a prescription for basaglar.

## 2017-05-19 ENCOUNTER — Ambulatory Visit: Payer: Medicare Other | Admitting: Neurology

## 2017-07-26 HISTORY — PX: COLONOSCOPY: SHX174

## 2017-08-05 ENCOUNTER — Ambulatory Visit
Admission: RE | Admit: 2017-08-05 | Discharge: 2017-08-05 | Disposition: A | Discharge status: Home / Self Care | Payer: Medicare Other | Source: Ambulatory Visit | Attending: Endocrinology | Admitting: Endocrinology

## 2017-08-05 ENCOUNTER — Encounter: Payer: Self-pay | Admitting: Endocrinology

## 2017-08-05 ENCOUNTER — Ambulatory Visit: Payer: Medicare Other | Admitting: Endocrinology

## 2017-08-05 VITALS — BP 176/74 | HR 64 | Temp 98.0°F | Wt 200.6 lb

## 2017-08-05 DIAGNOSIS — M7989 Other specified soft tissue disorders: Secondary | ICD-10-CM | POA: Diagnosis not present

## 2017-08-05 DIAGNOSIS — E1142 Type 2 diabetes mellitus with diabetic polyneuropathy: Secondary | ICD-10-CM

## 2017-08-05 DIAGNOSIS — Z794 Long term (current) use of insulin: Secondary | ICD-10-CM

## 2017-08-05 LAB — CBC WITH DIFFERENTIAL/PLATELET
Basophils Absolute: 0 10*3/uL (ref 0.0–0.1)
Basophils Relative: 0.8 % (ref 0.0–3.0)
EOS PCT: 1.8 % (ref 0.0–5.0)
Eosinophils Absolute: 0.1 10*3/uL (ref 0.0–0.7)
HEMATOCRIT: 39.4 % (ref 39.0–52.0)
HEMOGLOBIN: 13.5 g/dL (ref 13.0–17.0)
LYMPHS ABS: 2.3 10*3/uL (ref 0.7–4.0)
Lymphocytes Relative: 37.4 % (ref 12.0–46.0)
MCHC: 34.1 g/dL (ref 30.0–36.0)
MCV: 84.3 fl (ref 78.0–100.0)
MONOS PCT: 6.1 % (ref 3.0–12.0)
Monocytes Absolute: 0.4 10*3/uL (ref 0.1–1.0)
Neutro Abs: 3.3 10*3/uL (ref 1.4–7.7)
Neutrophils Relative %: 53.9 % (ref 43.0–77.0)
Platelets: 206 10*3/uL (ref 150.0–400.0)
RBC: 4.68 Mil/uL (ref 4.22–5.81)
RDW: 13.3 % (ref 11.5–15.5)
WBC: 6.1 10*3/uL (ref 4.0–10.5)

## 2017-08-05 LAB — POCT GLYCOSYLATED HEMOGLOBIN (HGB A1C): Hemoglobin A1C: 11.5

## 2017-08-05 MED ORDER — INSULIN NPH (HUMAN) (ISOPHANE) 100 UNIT/ML ~~LOC~~ SUSP: 70.0000 [IU] | SUBCUTANEOUS | 11 refills | Status: DC

## 2017-08-05 MED ORDER — AMOXICILLIN-POT CLAVULANATE 875-125 MG PO TABS: 1.0000 | ORAL_TABLET | Freq: Two times a day (BID) | ORAL | 0 refills | Status: AC

## 2017-08-05 MED ORDER — GLUCOSE BLOOD VI STRP: 1.0000 | ORAL_STRIP | Freq: Two times a day (BID) | 3 refills | Status: DC

## 2017-08-05 NOTE — Progress Notes (Signed)
Subjective:    Patient ID: Scott Donaldson, male    DOB: 13-Nov-1950, 67 y.o.   MRN: [id number]  HPI Pt returns for f/u of diabetes mellitus: DM type: insulin-requiring type 2.   [id number] Complications: polyneuropathy and foot ulcer.  Therapy: insulin since 2010 DKA: never.  Severe hypoglycemia: never.  Pancreatitis: never.   Other: he needs a simple insulin regimen, due to ongoing noncompliance; ins declined tresiba; he takes human insulin, due to cost Interval history: He has frequent mild hypoglycemia at lunch.  no cbg record.  He checks fasting only.  He seldom misses the insulin.  He takes NPH, 50 units qam.  He seldom checks cbg.  He denies hypoglycemia.   Pt states 5 days of moderate pain at the left foot, an assoc swelling.  He does not recall any injury such as stepping on anything.   Past Medical History:  Diagnosis Date  . DIABETES MELLITUS, TYPE II 03/04/2007  . HYPERCHOLESTEROLEMIA 07/29/2008  . HYPERTENSION 03/04/2007  . Psoriasis     Past Surgical History:  Procedure Laterality Date  . stress cardiolite  05/30/2003    Social History   Socioeconomic History  . Marital status: Single    Spouse name: Not on file  . Number of children: 2  . Years of education: 10  . Highest education level: Not on file  Social Needs  . Financial resource strain: Not on file  . Food insecurity - worry: Not on file  . Food insecurity - inability: Not on file  . Transportation needs - medical: Not on file  . Transportation needs - non-medical: Not on file  Occupational History  . Occupation: roofer  Tobacco Use  . Smoking status: Former Smoker    Last attempt to quit: 07/26/1988    Years since quitting: 29.0  . Smokeless tobacco: Never Used  Substance and Sexual Activity  . Alcohol use: No  . Drug use: No  . Sexual activity: Not on file  Other Topics Concern  . Not on file  Social History Narrative   Lives with daughter in a one story home.  Has 2 children.   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  Education: 10th grade.     Current Outpatient Medications on File Prior to Visit  Medication Sig Dispense Refill  . aspirin 325 MG tablet Take 325 mg by mouth daily.      . rosuvastatin (CRESTOR) 5 MG tablet Take 1 tablet (5 mg total) by mouth daily. 90 tablet 3  . tiZANidine (ZANAFLEX) 2 MG tablet 1 to 2 tablets every 8 hours as needed for neck pain. 30 tablet 0   No current facility-administered medications on file prior to visit.     No Known Allergies  Family History  Problem Relation Age of Onset  . Cancer Mother        Breast Cancer  . Cancer Father        uncertain type  . Cancer Brother 50       Colon Cancer    BP (!) 176/74 (BP Location: Left Arm, Patient Position: Sitting, Cuff Size: Normal)   Pulse 64   Temp 98 F (36.7 C)   Wt 200 lb 9.6 oz (91 kg)   SpO2 97%   BMI 27.98 kg/m   Review of Systems Denies fever and numbness.      Objective:   Physical Exam VITAL SIGNS:  See vs page GENERAL: no distress Pulses: dorsalis pedis intact bilat.   MSK:  no deformity of the feet.  CV: no leg edema.  Skin:  no ulcer on the feet, but there is a abrasion at the plantar aspect, under the distal left 5th metatarsal.  That part of the foot is swollen, but no warm or red.  normal color and temp on the feet.  Neuro: sensation is intact to touch on the feet.  Ext: There is bilateral onychomycosis of the toenails.   Lab Results  Component Value Date   HGBA1C 11.5 08/05/2017       Assessment & Plan:  Cellulitis, new Insulin-requiring type 2 DM, with polyneuropathy: poor control. HTN: recheck next time  Patient Instructions  blood tests and x-rays are requested for you today.  We'll let you know about the results. check your blood sugar twice a day.  vary the time of day when you check, between before the 3 meals, and at bedtime.  also check if you have symptoms of your blood sugar being too high or too low.  please keep a record of  the readings and bring it to your next appointment here (or you can bring the meter itself).  You can write it on any piece of paper.  please call us sooner if your blood sugar goes below 70, or if you have a lot of readings over 200.  Here is a new meter.  I have sent a prescription to your pharmacy, for strips. You will get better much faster if you elevate your foot above the rest of your body.  Please increase the insulin to 70 units each morning. Please come back for a follow-up appointment in 3-4 days.  Please go to the hospital sooner if you get worse.

## 2017-08-05 NOTE — Patient Instructions (Addendum)
blood tests and x-rays are requested for you today.  We'll let you know about the results. check your blood sugar twice a day.  vary the time of day when you check, between before the 3 meals, and at bedtime.  also check if you have symptoms of your blood sugar being too high or too low.  please keep a record of the readings and bring it to your next appointment here (or you can bring the meter itself).  You can write it on any piece of paper.  please call us sooner if your blood sugar goes below 70, or if you have a lot of readings over 200.  Here is a new meter.  I have sent a prescription to your pharmacy, for strips. You will get better much faster if you elevate your foot above the rest of your body.  Please increase the insulin to 70 units each morning. Please come back for a follow-up appointment in 3-4 days.  Please go to the hospital sooner if you get worse.

## 2017-08-09 ENCOUNTER — Ambulatory Visit: Payer: Medicare Other | Admitting: Endocrinology

## 2017-08-09 ENCOUNTER — Other Ambulatory Visit: Payer: Medicare Other

## 2017-08-09 ENCOUNTER — Encounter: Payer: Self-pay | Admitting: Endocrinology

## 2017-08-09 VITALS — BP 172/90 | HR 61 | Wt 199.2 lb

## 2017-08-09 DIAGNOSIS — E1142 Type 2 diabetes mellitus with diabetic polyneuropathy: Secondary | ICD-10-CM | POA: Diagnosis not present

## 2017-08-09 DIAGNOSIS — M7989 Other specified soft tissue disorders: Secondary | ICD-10-CM | POA: Diagnosis not present

## 2017-08-09 DIAGNOSIS — Z794 Long term (current) use of insulin: Secondary | ICD-10-CM | POA: Diagnosis not present

## 2017-08-09 MED ORDER — GLUCOSE BLOOD VI STRP: 1.0000 | ORAL_STRIP | Freq: Two times a day (BID) | 3 refills | Status: DC

## 2017-08-09 MED ORDER — INSULIN NPH (HUMAN) (ISOPHANE) 100 UNIT/ML ~~LOC~~ SUSP: 90.0000 [IU] | SUBCUTANEOUS | 11 refills | Status: DC

## 2017-08-09 MED ORDER — LOSARTAN POTASSIUM-HCTZ 50-12.5 MG PO TABS: 0.5000 | ORAL_TABLET | Freq: Every day | ORAL | 11 refills | Status: DC

## 2017-08-09 NOTE — Patient Instructions (Addendum)
Please increase the insulin to 90 units each morning. Please finish the antibiotic pill I have sent a prescription to your pharmacy, for the blood pressure.  check your blood sugar twice a day.  vary the time of day when you check, between before the 3 meals, and at bedtime.  also check if you have symptoms of your blood sugar being too high or too low.  please keep a record of the readings and bring it to your next appointment here (or you can bring the meter itself).  You can write it on any piece of paper.  please call us sooner if your blood sugar goes below 70, or if you have a lot of readings over 200.  Here is a new meter.  I have sent a prescription to your pharmacy, for strips. Please come back for a regular physical appointment in 2 months.

## 2017-08-09 NOTE — Progress Notes (Signed)
Subjective:    Patient ID: Scott Donaldson, male    DOB: 04/07/1951, 67 y.o.   MRN: [id number]  HPI Pt returns for f/u of diabetes mellitus: DM type: insulin-requiring type 2.   [id number] Complications: polyneuropathy and foot ulcer.  Therapy: insulin since 2010 DKA: never.  Severe hypoglycemia: never.  Pancreatitis: never.   Other: he needs a simple insulin regimen, due to ongoing noncompliance; ins declined tresiba; he takes human insulin, due to cost Interval history: no cbg record, but states cbg's are still in the 300's. He says left foot is much better. Past Medical History:  Diagnosis Date  . DIABETES MELLITUS, TYPE II 03/04/2007  . HYPERCHOLESTEROLEMIA 07/29/2008  . HYPERTENSION 03/04/2007  . Psoriasis     Past Surgical History:  Procedure Laterality Date  . stress cardiolite  05/30/2003    Social History   Socioeconomic History  . Marital status: Single    Spouse name: Not on file  . Number of children: 2  . Years of education: 10  . Highest education level: Not on file  Social Needs  . Financial resource strain: Not on file  . Food insecurity - worry: Not on file  . Food insecurity - inability: Not on file  . Transportation needs - medical: Not on file  . Transportation needs - non-medical: Not on file  Occupational History  . Occupation: roofer  Tobacco Use  . Smoking status: Former Smoker    Last attempt to quit: 07/26/1988    Years since quitting: 29.0  . Smokeless tobacco: Never Used  Substance and Sexual Activity  . Alcohol use: No  . Drug use: No  . Sexual activity: Not on file  Other Topics Concern  . Not on file  Social History Narrative   Lives with daughter in a one story home.  Has 2 children.  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  Education: 10th grade.     Current Outpatient Medications on File Prior to Visit  Medication Sig Dispense Refill  . amoxicillin-clavulanate (AUGMENTIN) 875-125 MG tablet Take 1 tablet by mouth every 12 (twelve)  hours for 7 days. 14 tablet 0  . aspirin 325 MG tablet Take 325 mg by mouth daily.      . rosuvastatin (CRESTOR) 5 MG tablet Take 1 tablet (5 mg total) by mouth daily. 90 tablet 3  . tiZANidine (ZANAFLEX) 2 MG tablet 1 to 2 tablets every 8 hours as needed for neck pain. 30 tablet 0   No current facility-administered medications on file prior to visit.     No Known Allergies  Family History  Problem Relation Age of Onset  . Cancer Mother        Breast Cancer  . Cancer Father        uncertain type  . Cancer Brother 50       Colon Cancer    BP (!) 172/90 (BP Location: Left Arm, Patient Position: Sitting, Cuff Size: Normal)   Pulse 61   Wt 199 lb 3.2 oz (90.4 kg)   SpO2 97%   BMI 27.78 kg/m    Review of Systems He denies hypoglycemia    Objective:   Physical Exam VITAL SIGNS:  See vs page GENERAL: no distress Left foot: swelling is better.  No warmth or erythema.  The abrasion on the plantar aspect of the left foot is healing.  No open ulcer.    Lab Results  Component Value Date   CREATININE 0.87 08/04/2016   BUN 20  08/04/2016   NA 137 08/04/2016   K 4.3 08/04/2016   CL 102 08/04/2016   CO2 24 08/04/2016       Assessment & Plan:  Cellulitis, much better Insulin-requiring type 2 DM, with polyneuropathy: he needs increased rx.    Patient Instructions  Please increase the insulin to 90 units each morning. Please finish the antibiotic pill I have sent a prescription to your pharmacy, for the blood pressure.  check your blood sugar twice a day.  vary the time of day when you check, between before the 3 meals, and at bedtime.  also check if you have symptoms of your blood sugar being too high or too low.  please keep a record of the readings and bring it to your next appointment here (or you can bring the meter itself).  You can write it on any piece of paper.  please call us sooner if your blood sugar goes below 70, or if you have a lot of readings over 200.  Here is a  new meter.  I have sent a prescription to your pharmacy, for strips. Please come back for a regular physical appointment in 2 months.

## 2017-08-11 ENCOUNTER — Other Ambulatory Visit: Payer: Self-pay

## 2017-08-11 MED ORDER — ACCU-CHEK GUIDE W/DEVICE KIT: 1.0000 | PACK | Freq: Every day | 0 refills | Status: DC

## 2017-08-11 MED ORDER — GLUCOSE BLOOD VI STRP: ORAL_STRIP | 12 refills | Status: DC

## 2017-08-15 LAB — CULTURE, BLOOD (SINGLE): MICRO NUMBER:: 90060214

## 2017-08-28 ENCOUNTER — Other Ambulatory Visit: Payer: Self-pay | Admitting: Endocrinology

## 2017-08-28 DIAGNOSIS — D126 Benign neoplasm of colon, unspecified: Secondary | ICD-10-CM

## 2017-09-26 ENCOUNTER — Encounter: Payer: Self-pay | Admitting: Endocrinology

## 2017-12-01 ENCOUNTER — Other Ambulatory Visit: Payer: Self-pay

## 2017-12-01 ENCOUNTER — Telehealth: Payer: Self-pay | Admitting: Endocrinology

## 2017-12-01 MED ORDER — OLMESARTAN MEDOXOMIL-HCTZ 20-12.5 MG PO TABS: 0.5000 | ORAL_TABLET | Freq: Every day | ORAL | 0 refills | Status: DC

## 2017-12-01 NOTE — Telephone Encounter (Signed)
I have sent a prescription to your pharmacy, to refill x 1 F/u ov is due

## 2017-12-01 NOTE — Patient Outreach (Signed)
[place] [facility]) Care Management  12/01/2017  [name] [birth year] [id number]   Medication Adherence call to [name] patient is showing past due under [place] spoke with patient daughter she said Scott Donaldson has stop taking this medication because it was on back order and his blood pressure has been ok ,call doctors office to let then know patient has stop taking this medication.   [place] [facility] Direct Dial [phone]  Fax [phone] [email or web address]

## 2017-12-01 NOTE — Telephone Encounter (Signed)
Scott Donaldson [phone] called from [facility] regarding medication losartan-hydrochlorothiazide (HYZAAR) 50-12.5 MG tablet pt had stop taking medication due to it being on back order per daughter of pt. Please advise and Thank you!

## 2018-05-01 ENCOUNTER — Encounter: Payer: Self-pay | Admitting: Endocrinology

## 2018-05-01 ENCOUNTER — Ambulatory Visit: Payer: Medicare Other | Admitting: Endocrinology

## 2018-05-01 VITALS — BP 140/72 | HR 61 | Ht 71.0 in | Wt 198.2 lb

## 2018-05-01 DIAGNOSIS — Z794 Long term (current) use of insulin: Secondary | ICD-10-CM

## 2018-05-01 DIAGNOSIS — E1142 Type 2 diabetes mellitus with diabetic polyneuropathy: Secondary | ICD-10-CM

## 2018-05-01 LAB — POCT GLYCOSYLATED HEMOGLOBIN (HGB A1C): HEMOGLOBIN A1C: 12.2 % — AB (ref 4.0–5.6)

## 2018-05-01 MED ORDER — OLMESARTAN MEDOXOMIL-HCTZ 20-12.5 MG PO TABS: 0.5000 | ORAL_TABLET | Freq: Every day | ORAL | 3 refills | Status: DC

## 2018-05-01 NOTE — Progress Notes (Signed)
Subjective:    Patient ID: Scott Donaldson, male    DOB: 31-May-1951, 67 y.o.   MRN: [id number]  HPI Pt returns for f/u of diabetes mellitus: DM type: insulin-requiring type 2.   [id number] Complications: polyneuropathy and foot ulcer.  Therapy: insulin since 2010 DKA: never.  Severe hypoglycemia: never.  Pancreatitis: never.   Other: he needs a simple insulin regimen, due to ongoing noncompliance; ins declined tresiba; he takes human insulin, due to cost.   Interval history: no cbg record, but states cbg's are still in the 300's.  He says he never misses the insulin.   He ran out of Benicar-HCT, and did not request refill.   Past Medical History:  Diagnosis Date  . DIABETES MELLITUS, TYPE II 03/04/2007  . HYPERCHOLESTEROLEMIA 07/29/2008  . HYPERTENSION 03/04/2007  . Psoriasis     Past Surgical History:  Procedure Laterality Date  . stress cardiolite  05/30/2003    Social History   Socioeconomic History  . Marital status: Single    Spouse name: Not on file  . Number of children: 2  . Years of education: 10  . Highest education level: Not on file  Occupational History  . Occupation: roofer  Social Needs  . Financial resource strain: Not on file  . Food insecurity:    Worry: Not on file    Inability: Not on file  . Transportation needs:    Medical: Not on file    Non-medical: Not on file  Tobacco Use  . Smoking status: Former Smoker    Last attempt to quit: 07/26/1988    Years since quitting: 29.7  . Smokeless tobacco: Never Used  Substance and Sexual Activity  . Alcohol use: No  . Drug use: No  . Sexual activity: Not on file  Lifestyle  . Physical activity:    Days per week: Not on file    Minutes per session: Not on file  . Stress: Not on file  Relationships  . Social connections:    Talks on phone: Not on file    Gets together: Not on file    Attends religious service: Not on file    Active member of club or organization: Not on file    Attends meetings  of clubs or organizations: Not on file    Relationship status: Not on file  . Intimate partner violence:    Fear of current or ex partner: Not on file    Emotionally abused: Not on file    Physically abused: Not on file    Forced sexual activity: Not on file  Other Topics Concern  . Not on file  Social History Narrative   Lives with daughter in a one story home.  Has 2 children.  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  Education: 10th grade.     Current Outpatient Medications on File Prior to Visit  Medication Sig Dispense Refill  . glucose blood (ACCU-CHEK GUIDE) test strip Used to check blood sugars twice daily. 100 each 12  . insulin NPH Human (NOVOLIN N RELION) 100 UNIT/ML injection Inject 0.9 mLs (90 Units total) into the skin every morning. And syringes 1/day 30 mL 11   No current facility-administered medications on file prior to visit.     No Known Allergies  Family History  Problem Relation Age of Onset  . Cancer Mother        Breast Cancer  . Cancer Father        uncertain type  .  Cancer Brother 50       Colon Cancer    BP 140/72 (BP Location: Left Arm, Patient Position: Sitting)   Pulse 61   Ht 5\' 11"  (1.803 m)   Wt 198 lb 3.2 oz (89.9 kg)   SpO2 97%   BMI 27.64 kg/m   Review of Systems He denies hypoglycemia.      Objective:   Physical Exam VITAL SIGNS:  See vs page GENERAL: no distress Pulses: foot pulses are intact bilaterally.   MSK: no deformity of the feet or ankles.  CV: no edema of the legs or ankles Skin:  no ulcer on the feet or ankles.  normal color and temp on the feet and ankles Neuro: sensation is intact to touch on the feet and ankles.     Lab Results  Component Value Date   HGBA1C 12.2 (A) 05/01/2018       Assessment & Plan:  Insulin-requiring type 2 DM: worse.  Ref DM educ, to review injection technique.  Missing BP medication.  This may explain poor a1c, also  Patient Instructions  I have sent a prescription to your pharmacy,  to resume the blood pressure pill.  check your blood sugar twice a day.  vary the time of day when you check, between before the 3 meals, and at bedtime.  also check if you have symptoms of your blood sugar being too high or too low.  please keep a record of the readings and bring it to your next appointment here (or you can bring the meter itself).  You can write it on any piece of paper.  please call us sooner if your blood sugar goes below 70, or if you have a lot of readings over 200.   Please see a diabetes education specialist.  Please come back for a follow-up appointment in 2 months.

## 2018-05-01 NOTE — Patient Instructions (Addendum)
I have sent a prescription to your pharmacy, to resume the blood pressure pill.  check your blood sugar twice a day.  vary the time of day when you check, between before the 3 meals, and at bedtime.  also check if you have symptoms of your blood sugar being too high or too low.  please keep a record of the readings and bring it to your next appointment here (or you can bring the meter itself).  You can write it on any piece of paper.  please call us sooner if your blood sugar goes below 70, or if you have a lot of readings over 200.   Please see a diabetes education specialist.  Please come back for a follow-up appointment in 2 months.

## 2018-05-22 ENCOUNTER — Ambulatory Visit: Payer: Medicare Other | Admitting: Dietician

## 2018-06-14 ENCOUNTER — Encounter: Payer: Self-pay | Admitting: Family Medicine

## 2018-06-14 ENCOUNTER — Ambulatory Visit: Payer: Medicare Other | Admitting: Family Medicine

## 2018-06-14 VITALS — BP 136/80 | HR 64 | Temp 98.8°F | Ht 71.0 in | Wt 198.2 lb

## 2018-06-14 DIAGNOSIS — E7800 Pure hypercholesterolemia, unspecified: Secondary | ICD-10-CM

## 2018-06-14 DIAGNOSIS — Z794 Long term (current) use of insulin: Secondary | ICD-10-CM

## 2018-06-14 DIAGNOSIS — Z23 Encounter for immunization: Secondary | ICD-10-CM

## 2018-06-14 DIAGNOSIS — D126 Benign neoplasm of colon, unspecified: Secondary | ICD-10-CM | POA: Diagnosis not present

## 2018-06-14 DIAGNOSIS — R51 Headache: Secondary | ICD-10-CM | POA: Diagnosis not present

## 2018-06-14 DIAGNOSIS — R972 Elevated prostate specific antigen [PSA]: Secondary | ICD-10-CM | POA: Diagnosis not present

## 2018-06-14 DIAGNOSIS — R9431 Abnormal electrocardiogram [ECG] [EKG]: Secondary | ICD-10-CM

## 2018-06-14 DIAGNOSIS — R519 Headache, unspecified: Secondary | ICD-10-CM

## 2018-06-14 DIAGNOSIS — E1165 Type 2 diabetes mellitus with hyperglycemia: Secondary | ICD-10-CM

## 2018-06-14 DIAGNOSIS — R3911 Hesitancy of micturition: Secondary | ICD-10-CM

## 2018-06-14 DIAGNOSIS — G44209 Tension-type headache, unspecified, not intractable: Secondary | ICD-10-CM

## 2018-06-14 DIAGNOSIS — I10 Essential (primary) hypertension: Secondary | ICD-10-CM

## 2018-06-14 DIAGNOSIS — Z125 Encounter for screening for malignant neoplasm of prostate: Secondary | ICD-10-CM

## 2018-06-14 DIAGNOSIS — N401 Enlarged prostate with lower urinary tract symptoms: Secondary | ICD-10-CM

## 2018-06-14 DIAGNOSIS — H269 Unspecified cataract: Secondary | ICD-10-CM

## 2018-06-14 HISTORY — DX: Benign prostatic hyperplasia with lower urinary tract symptoms: N40.1

## 2018-06-14 HISTORY — DX: Pure hypercholesterolemia, unspecified: E78.00

## 2018-06-14 LAB — URINALYSIS, ROUTINE W REFLEX MICROSCOPIC
BILIRUBIN URINE: NEGATIVE
HGB URINE DIPSTICK: NEGATIVE
Ketones, ur: NEGATIVE
LEUKOCYTES UA: NEGATIVE
NITRITE: NEGATIVE
PH: 5.5 (ref 5.0–8.0)
RBC / HPF: NONE SEEN (ref 0–?)
Specific Gravity, Urine: 1.02 (ref 1.000–1.030)
Urobilinogen, UA: 0.2 (ref 0.0–1.0)

## 2018-06-14 LAB — MICROALBUMIN / CREATININE URINE RATIO
CREATININE, U: 77.8 mg/dL
MICROALB UR: 15.8 mg/dL — AB (ref 0.0–1.9)
Microalb Creat Ratio: 20.3 mg/g (ref 0.0–30.0)

## 2018-06-14 LAB — CBC
HCT: 43 % (ref 39.0–52.0)
Hemoglobin: 14.5 g/dL (ref 13.0–17.0)
MCHC: 33.8 g/dL (ref 30.0–36.0)
MCV: 85.1 fl (ref 78.0–100.0)
Platelets: 202 10*3/uL (ref 150.0–400.0)
RBC: 5.05 Mil/uL (ref 4.22–5.81)
RDW: 13.6 % (ref 11.5–15.5)
WBC: 5.3 10*3/uL (ref 4.0–10.5)

## 2018-06-14 LAB — PSA: PSA: 8.36 ng/mL — AB (ref 0.10–4.00)

## 2018-06-14 LAB — COMPREHENSIVE METABOLIC PANEL
ALK PHOS: 62 U/L (ref 39–117)
ALT: 13 U/L (ref 0–53)
AST: 14 U/L (ref 0–37)
Albumin: 4.6 g/dL (ref 3.5–5.2)
BILIRUBIN TOTAL: 0.5 mg/dL (ref 0.2–1.2)
BUN: 24 mg/dL — AB (ref 6–23)
CO2: 25 meq/L (ref 19–32)
Calcium: 10.1 mg/dL (ref 8.4–10.5)
Chloride: 98 mEq/L (ref 96–112)
Creatinine, Ser: 1.02 mg/dL (ref 0.40–1.50)
GFR: 77.18 mL/min (ref >60.00)
GLUCOSE: 355 mg/dL — AB (ref 70–99)
Potassium: 5 mEq/L (ref 3.5–5.1)
SODIUM: 133 meq/L — AB (ref 135–145)
TOTAL PROTEIN: 7.7 g/dL (ref 6.0–8.3)

## 2018-06-14 LAB — LIPID PANEL
Cholesterol: 285 mg/dL — ABNORMAL HIGH (ref 0–200)
HDL: 40.9 mg/dL (ref >39.00)
NONHDL: 243.6
Total CHOL/HDL Ratio: 7
Triglycerides: 266 mg/dL — ABNORMAL HIGH (ref 0.0–149.0)
VLDL: 53.2 mg/dL — ABNORMAL HIGH (ref 0.0–40.0)

## 2018-06-14 LAB — LDL CHOLESTEROL, DIRECT: Direct LDL: 180 mg/dL

## 2018-06-14 MED ORDER — ASPIRIN EC 325 MG PO TBEC: 325.0000 mg | DELAYED_RELEASE_TABLET | Freq: Every day | ORAL | 4 refills | Status: DC

## 2018-06-14 NOTE — Addendum Note (Signed)
Addended by: Jon Billings on: 06/14/2018 11:44 AM   Modules accepted: Orders

## 2018-06-14 NOTE — Progress Notes (Addendum)
Subjective:  Patient ID: Scott Donaldson, male    DOB: [birth year]  Age: 67 y.o. MRN: [id number]  CC: Establish Care   HPI Scott Donaldson presents for establishment of care over follow-up of multiple medical issues.  He is seeing Dr. Ebony Hail for his diabetes.  Currently patient is taking 50 units of NPH at 8 PM.  At this time he is on no other medicines for his diabetes.  Affordability of medicines has been an issue for him.  His last hemoglobin A1c 1 month ago was 12.2.  Dr. Ebony Hail did check his feet this last clinic visit.  Patient is accompanied by his daughter today.  He is overdue for an eye check.  He does have a history of cataracts.  He does have a history of colon polyps.  His last documented colonoscopy was in 2006.  Patient has been experiencing progressive headaches over the last 5 years.  They have been increasing in frequency and intensity.  They occur almost daily.  He feels them in the right frontal area and sometimes behind the right ear.  They can last all day.  There are no prodromal symptoms.  He sometimes experiences nausea.  He sometimes takes ibuprofen for them.  He has no headache history.  There is no family history of headaches that he is aware of.  Patient has had no history of heart disease that he is aware of.  He does not have chest pain shortness of breath.  He has been dealing with the ED for years.  Outpatient Medications Prior to Visit  Medication Sig Dispense Refill  . glucose blood (ACCU-CHEK GUIDE) test strip Used to check blood sugars twice daily. 100 each 12  . insulin NPH Human (NOVOLIN N RELION) 100 UNIT/ML injection Inject 0.9 mLs (90 Units total) into the skin every morning. And syringes 1/day 30 mL 11  . olmesartan-hydrochlorothiazide (BENICAR HCT) 20-12.5 MG tablet Take 0.5 tablets by mouth daily. 45 tablet 3   No facility-administered medications prior to visit.     ROS Review of Systems  Constitutional: Positive for fatigue. Negative for  diaphoresis, fever and unexpected weight change.  HENT: Negative.   Eyes: Positive for visual disturbance. Negative for photophobia.  Respiratory: Negative for chest tightness, shortness of breath and wheezing.   Cardiovascular: Negative for chest pain, palpitations and leg swelling.  Gastrointestinal: Negative.  Negative for abdominal pain, anal bleeding and blood in stool.  Endocrine: Negative for polyphagia and polyuria.  Genitourinary: Positive for difficulty urinating. Negative for dysuria, frequency and urgency.  Musculoskeletal: Positive for arthralgias and myalgias.  Skin: Negative for color change.  Allergic/Immunologic: Negative for immunocompromised state.  Neurological: Positive for headaches. Negative for seizures, speech difficulty and numbness.  Hematological: Does not bruise/bleed easily.  Psychiatric/Behavioral: Negative for behavioral problems and dysphoric mood. The patient is not nervous/anxious.     Objective:  BP 136/80 (BP Location: Left Arm, Patient Position: Sitting, Cuff Size: Normal)   Pulse 64   Temp 98.8 F (37.1 C) (Oral)   Ht 5\' 11"  (1.803 m)   Wt 198 lb 4 oz (89.9 kg)   SpO2 97%   BMI 27.65 kg/m   BP Readings from Last 3 Encounters:  06/14/18 136/80  05/01/18 140/72  08/09/17 (!) 172/90    Wt Readings from Last 3 Encounters:  06/14/18 198 lb 4 oz (89.9 kg)  05/01/18 198 lb 3.2 oz (89.9 kg)  08/09/17 199 lb 3.2 oz (90.4 kg)    Physical Exam  Constitutional: He is oriented to person, place, and time. He appears well-nourished. No distress.  HENT:  Head: Normocephalic and atraumatic.  Right Ear: External ear normal.  Left Ear: External ear normal.  Mouth/Throat: Oropharynx is clear and moist. No oropharyngeal exudate.  Eyes: Pupils are equal, round, and reactive to light. Conjunctivae and EOM are normal. Right eye exhibits no discharge. Left eye exhibits no discharge. No scleral icterus.  Neck: Neck supple. No JVD present. No tracheal  deviation present. No thyromegaly present.  Cardiovascular: Normal rate and regular rhythm.  Occasional extrasystoles are present.  Murmur heard.  Systolic murmur is present with a grade of 2/6. Pulmonary/Chest: Effort normal and breath sounds normal.  Abdominal: Soft. Bowel sounds are normal. He exhibits no distension and no mass. There is no tenderness. There is no guarding.  Genitourinary: Rectal exam shows external hemorrhoid. Rectal exam shows no internal hemorrhoid, no fissure, no mass, no tenderness, anal tone normal and guaiac negative stool. Prostate is enlarged. Prostate is not tender.  Lymphadenopathy:    He has no cervical adenopathy.  Neurological: He is alert and oriented to person, place, and time.  Skin: Skin is warm and dry. No rash noted. He is not diaphoretic. No erythema.  Psychiatric: He has a normal mood and affect. His behavior is normal.    Lab Results  Component Value Date   WBC 6.1 08/05/2017   HGB 13.5 08/05/2017   HCT 39.4 08/05/2017   PLT 206.0 08/05/2017   GLUCOSE 258 (H) 08/04/2016   CHOL 274 (H) 08/04/2016   TRIG (H) 08/04/2016    479.0 Triglyceride is over 400; calculations on Lipids are invalid.   HDL 35.80 (L) 08/04/2016   LDLDIRECT 134.0 08/04/2016   LDLCALC 119 (H) 05/04/2007   ALT 13 12/10/2016   AST 16 12/10/2016   NA 137 08/04/2016   K 4.3 08/04/2016   CL 102 08/04/2016   CREATININE 0.87 08/04/2016   BUN 20 08/04/2016   CO2 24 08/04/2016   TSH 1.18 08/04/2016   PSA 6.44 (H) 08/04/2016   HGBA1C 12.2 (A) 05/01/2018   MICROALBUR 5.9 (H) 08/04/2016    Dg Foot Complete Left  Result Date: 08/06/2017 CLINICAL DATA:  Left foot pain with redness for 1 week.  No trauma. EXAM: LEFT FOOT - COMPLETE 3+ VIEW COMPARISON:  None. FINDINGS: Vascular calcifications are identified. No fractures or dislocations. No bony erosion. Soft tissue swelling noted. IMPRESSION: No bony erosion to suggest osteomyelitis.  No fracture identified. Electronically Signed    By: Dorise Bullion III M.D   On: 08/06/2017 11:00    Assessment & Plan:   Leslie was seen today for establish care.  Diagnoses and all orders for this visit:  Benign neoplasm of colon, unspecified part of colon -     Ambulatory referral to Gastroenterology  Essential hypertension -     CBC -     Comprehensive metabolic panel -     Urinalysis, Routine w reflex microscopic -     Microalbumin / creatinine urine ratio -     EKG 12-Lead -     aspirin EC 325 MG tablet; Take 1 tablet (325 mg total) by mouth daily.  HYPERCHOLESTEROLEMIA -     CBC -     Comprehensive metabolic panel -     aspirin EC 325 MG tablet; Take 1 tablet (325 mg total) by mouth daily.  Screening for prostate cancer  Nonintractable headache, unspecified chronicity pattern, unspecified headache type  Abnormal PSA -  PSA  Benign prostatic hyperplasia with urinary hesitancy -     PSA  Elevated LDL cholesterol level -     Lipid panel -     aspirin EC 325 MG tablet; Take 1 tablet (325 mg total) by mouth daily.  Nonintractable episodic headache, unspecified headache type  Cataract, unspecified cataract type, unspecified laterality -     Cancel: Ambulatory referral to Ophthalmology -     Ambulatory referral to Ophthalmology  Abnormal EKG -     Ambulatory referral to Cardiology -     EKG 12-Lead -     aspirin EC 325 MG tablet; Take 1 tablet (325 mg total) by mouth daily.  Need for immunization against influenza -     Flu vaccine HIGH DOSE PF  Acute non intractable tension-type headache -     Cancel: CT Head W Contrast; Future -     CT HEAD W & WO CONTRAST; Future  Type 2 diabetes mellitus with hyperglycemia, with long-term current use of insulin (HCC) -     Ambulatory referral to diabetic education -     aspirin EC 325 MG tablet; Take 1 tablet (325 mg total) by mouth daily.   I am having Josua A. Acebo start on aspirin EC. I am also having him maintain his insulin NPH Human, glucose blood,  and olmesartan-hydrochlorothiazide.  Meds ordered this encounter  Medications  . aspirin EC 325 MG tablet    Sig: Take 1 tablet (325 mg total) by mouth daily.    Dispense:  90 tablet    Refill:  4     Follow-up: Return in about 4 weeks (around 07/12/2018).  [name], MD

## 2018-06-15 ENCOUNTER — Ambulatory Visit (HOSPITAL_BASED_OUTPATIENT_CLINIC_OR_DEPARTMENT_OTHER)
Admission: RE | Admit: 2018-06-15 | Discharge: 2018-06-15 | Disposition: A | Discharge status: Home / Self Care | Payer: Medicare Other | Source: Ambulatory Visit | Attending: Family Medicine | Admitting: Family Medicine

## 2018-06-15 ENCOUNTER — Encounter (HOSPITAL_BASED_OUTPATIENT_CLINIC_OR_DEPARTMENT_OTHER): Payer: Self-pay

## 2018-06-15 DIAGNOSIS — G44209 Tension-type headache, unspecified, not intractable: Secondary | ICD-10-CM | POA: Insufficient documentation

## 2018-06-15 DIAGNOSIS — R51 Headache: Secondary | ICD-10-CM | POA: Diagnosis not present

## 2018-06-15 DIAGNOSIS — R42 Dizziness and giddiness: Secondary | ICD-10-CM | POA: Diagnosis not present

## 2018-06-15 MED ORDER — IOPAMIDOL (ISOVUE-300) INJECTION 61%
100.0000 mL | Freq: Once | INTRAVENOUS | Status: AC | PRN
  Administered 2018-06-15: 80 mL via INTRAVENOUS

## 2018-06-15 MED ORDER — ATORVASTATIN CALCIUM 20 MG PO TABS: 20.0000 mg | ORAL_TABLET | Freq: Every day | ORAL | 0 refills | Status: DC

## 2018-06-15 NOTE — Addendum Note (Signed)
Addended by: Jon Billings on: 06/15/2018 08:21 AM   Modules accepted: Orders

## 2018-06-26 ENCOUNTER — Encounter: Payer: Self-pay | Admitting: Family Medicine

## 2018-06-26 ENCOUNTER — Telehealth: Payer: Self-pay | Admitting: Family Medicine

## 2018-06-26 NOTE — Progress Notes (Signed)
Cardiology Office Note   Date:  06/27/2018   ID:  Scott Donaldson, DOB 1951/06/29, MRN [id number]  PCP:  [name], MD  Cardiologist:   Jenkins Rouge, MD   No chief complaint on file.     History of Present Illness: Scott Donaldson is a 67 y.o. male who presents for consultation regarding abnormal ECG and CAD. Referred by Dr Ethelene Hal DM poorly controlled with A1c 12.2 No history of vascular disease or CAD. 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 Anterior MI.  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 Has some atypical chest pain resting in AM Not exertional  Had an episode of dizziness 2 months ago with poor balance Resolved after about 30 minutes    Past Medical History:  Diagnosis Date  . Abdominal pain 08/04/2016  . Abnormal PSA 05/19/2015  . Arthralgia 11/12/2010  . Benign prostatic hyperplasia with urinary hesitancy 06/14/2018  . CELLULITIS, FOOT, RIGHT 07/02/2008   Qualifier: Diagnosis of  By: Marca Ancona RMA, Lucy    . Cough 08/04/2016  . Diabetes ([place]) 03/04/2007   Qualifier: Diagnosis of  By: Loanne Drilling MD, Jacelyn Pi   . DIABETES MELLITUS, TYPE II 03/04/2007  . Edema 07/02/2008   Qualifier: Diagnosis of  By: Marca Ancona RMA, Lucy    . Elevated LDL cholesterol level 06/14/2018  . Foot ulcer, right ([place]) 04/15/2014  . HYPERCHOLESTEROLEMIA 07/29/2008  . HYPERTENSION 03/04/2007  . Knee pain, left 05/16/2015  . Loss of vision 04/15/2014  . Nonintractable episodic headache 12/01/2016  . Psoriasis   . SHOULDER PAIN, BILATERAL 01/12/2010   Qualifier: Diagnosis of  By: Loanne Drilling MD, Jacelyn Pi   . Swelling of left foot 08/05/2017  . 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 stress cardiolite  05/30/2003     Current Outpatient Medications   Medication Sig Dispense Refill  . atorvastatin (LIPITOR) 20 MG tablet Take 1 tablet (20 mg total) by mouth daily. 90 tablet 0  . glucose blood (ACCU-CHEK GUIDE) test strip Used to check blood sugars twice daily. 100 each 12  . insulin NPH Human (NOVOLIN N RELION) 100 UNIT/ML injection Inject 0.9 mLs (90 Units total) into the skin every morning. And syringes 1/day 30 mL 11  . olmesartan-hydrochlorothiazide (BENICAR HCT) 20-12.5 MG tablet Take 0.5 tablets by mouth daily. 45 tablet 3  . aspirin EC 81 MG tablet Take 1 tablet (81 mg total) by mouth daily.     No current facility-administered medications for this visit.     Allergies:   Patient has no known allergies.    Social History:  The patient  reports that he quit smoking about 29 years ago. He has never used smokeless tobacco. He reports that he does not drink alcohol or use drugs.   Family History:  The patient's family history includes Cancer in his father and mother; Cancer (age of onset: 34) in his brother; Diabetes in his father.    ROS:  Please see the history of present illness.   Otherwise, review of systems are positive for none.   All other systems are reviewed and negative.    PHYSICAL EXAM: VS:  BP 126/72  Pulse 70   Ht 6' (1.829 m)   Wt 197 lb 12.8 oz (89.7 kg)   SpO2 97%   BMI 26.83 kg/m  , BMI Body mass index is 26.83 kg/m. Affect appropriate Healthy:  appears stated age 67: normal Neck supple with no adenopathy JVP normal no bruits no thyromegaly Lungs clear with no wheezing and good diaphragmatic motion Heart:  S1/S2 no murmur, no rub, gallop or click PMI normal Abdomen: benighn, BS positve, no tenderness, no AAA no bruit.  No HSM or HJR Distal pulses intact with no bruits No edema Neuro non-focal Skin warm and dry No muscular weakness    EKG:  06/27/18 SR rate 61 LBBB    Recent Labs: 06/14/2018: ALT 13; BUN 24; Creatinine, Ser 1.02; Hemoglobin 14.5; Platelets 202.0; Potassium 5.0; Sodium  133    Lipid Panel    Component Value Date/Time   CHOL 285 (H) 06/14/2018 1015   TRIG 266.0 (H) 06/14/2018 1015   HDL 40.90 06/14/2018 1015   CHOLHDL 7 06/14/2018 1015   VLDL 53.2 (H) 06/14/2018 1015   LDLCALC 119 (H) 05/04/2007 0751   LDLDIRECT 180.0 06/14/2018 1015      Wt Readings from Last 3 Encounters:  06/27/18 197 lb 12.8 oz (89.7 kg)  06/14/18 198 lb 4 oz (89.9 kg)  05/01/18 198 lb 3.2 oz (89.9 kg)      Other studies Reviewed: Additional studies/ records that were reviewed today include: Notes from primary ECG;s and labs .    ASSESSMENT AND PLAN:  1.  Abnormal ECG: LAFB/ICLBBB check TTE to r/o structural heart disease Note no acute changes on ECG  Has had LAFB and IVCD for over 3 years 2.  HLD:  Continue statin LDL not at goal 3. HTN:  Well controlled.  Continue current medications and low sodium Dash type diet.   4. DM:  Discussed low carb diet.  Target hemoglobin A1c is 6.5 or less.  Continue current medications. 5. Chest Pain:  He has atypical resting pain However given risk factors and poorly controlled DM With abnormal ECG will order myovue Lexiscan given LBBB 6. Dizziness:  One episode if repeats consider MRI head r/o posterior circulation stroke ASA 81 mg   Current medicines are reviewed at length with the patient today.  The patient does not have concerns regarding medicines.  The following changes have been made:  None   Labs/ tests ordered today include: TTE and Lexi Myovue   Orders Placed This Encounter  Procedures  . MYOCARDIAL PERFUSION IMAGING  . EKG 12-Lead     Disposition:   FU with cardiology  In a year     Signed, Jenkins Rouge, MD  06/27/2018 2:55 PM    [address], [place], [place]  [address] Phone: [phone]; Fax: [phone]

## 2018-06-26 NOTE — Telephone Encounter (Signed)
Can we please check on patient's referral to Cardiology? It's been two weeks & he hasn't been contacted.   Thanks!

## 2018-06-27 ENCOUNTER — Encounter: Payer: Self-pay | Admitting: Cardiovascular Disease

## 2018-06-27 ENCOUNTER — Ambulatory Visit: Payer: Medicare Other | Admitting: Cardiovascular Disease

## 2018-06-27 VITALS — BP 126/72 | HR 70 | Ht 72.0 in | Wt 197.8 lb

## 2018-06-27 DIAGNOSIS — R079 Chest pain, unspecified: Secondary | ICD-10-CM

## 2018-06-27 DIAGNOSIS — R9431 Abnormal electrocardiogram [ECG] [EKG]: Secondary | ICD-10-CM

## 2018-06-27 MED ORDER — ASPIRIN EC 81 MG PO TBEC: 81.0000 mg | DELAYED_RELEASE_TABLET | Freq: Every day | ORAL | Status: DC

## 2018-06-27 NOTE — Telephone Encounter (Signed)
Cardiology has called patient and set up appointment.

## 2018-06-27 NOTE — Patient Instructions (Addendum)
Medication Instructions:  Your physician has recommended you make the following change in your medication:  1-STOP Aspirin 325 mg 2-START Aspirin 81 mg by mouth daily.  If you need a refill on your cardiac medications before your next appointment, please call your pharmacy.   Lab work:  If you have labs (blood work) drawn today and your tests are completely normal, you will receive your results only by: [name] MyChart Message (if you have MyChart) OR . A paper copy in the mail If you have any lab test that is abnormal or we need to change your treatment, we will call you to review the results.  Testing/Procedures: Your physician has requested that you have en exercise stress myoview. For further information please visit [email or web address]. Please follow instruction sheet, as given.  Follow-Up: At [facility], you and your health needs are our priority.  As part of our continuing mission to provide you with exceptional heart care, we have created designated Provider Care Teams.  These Care Teams include your primary Cardiologist (physician) and Advanced Practice Providers (APPs -  Physician Assistants and Nurse Practitioners) who all work together to provide you with the care you need, when you need it. Your physician recommends that you schedule a follow-up appointment as needed with Dr. [name].

## 2018-06-29 ENCOUNTER — Telehealth (HOSPITAL_COMMUNITY): Payer: Self-pay | Admitting: *Deleted

## 2018-06-29 NOTE — Telephone Encounter (Signed)
Patient's daughter, per dpr, given detailed instructions per Myocardial Perfusion Study Information Sheet for the test on 07/04/18. Patient notified to arrive 15 minutes early and that it is imperative to arrive on time for appointment to keep from having the test rescheduled.  If you need to cancel or reschedule your appointment, please call the office within 24 hours of your appointment. . Patient verbalized understanding. Scott Donaldson

## 2018-07-03 ENCOUNTER — Encounter: Payer: Self-pay | Admitting: Endocrinology

## 2018-07-03 ENCOUNTER — Ambulatory Visit: Payer: Medicare Other | Admitting: Endocrinology

## 2018-07-03 VITALS — BP 136/82 | HR 64 | Ht 71.0 in | Wt 197.8 lb

## 2018-07-03 DIAGNOSIS — Z794 Long term (current) use of insulin: Secondary | ICD-10-CM | POA: Diagnosis not present

## 2018-07-03 DIAGNOSIS — E1142 Type 2 diabetes mellitus with diabetic polyneuropathy: Secondary | ICD-10-CM | POA: Diagnosis not present

## 2018-07-03 LAB — POCT GLYCOSYLATED HEMOGLOBIN (HGB A1C): Hemoglobin A1C: 10.6 % — AB (ref 4.0–5.6)

## 2018-07-03 MED ORDER — INSULIN NPH (HUMAN) (ISOPHANE) 100 UNIT/ML ~~LOC~~ SUSP: 100.0000 [IU] | SUBCUTANEOUS | 11 refills | Status: DC

## 2018-07-03 NOTE — Patient Instructions (Addendum)
check your blood sugar twice a day.  vary the time of day when you check, between before the 3 meals, and at bedtime.  also check if you have symptoms of your blood sugar being too high or too low.  please keep a record of the readings and bring it to your next appointment here (or you can bring the meter itself).  You can write it on any piece of paper.  please call us sooner if your blood sugar goes below 70, or if you have a lot of readings over 200.   Please see a diabetes education specialist as scheduled.   Please increase the insulin to 100 units each morning.   Please come back for a follow-up appointment in 2 months.

## 2018-07-03 NOTE — Progress Notes (Signed)
Subjective:    Patient ID: Scott Donaldson, male    DOB: [birth year], 67 y.o.   MRN: [id number]  HPI Pt returns for f/u of diabetes mellitus: DM type: insulin-requiring type 2.   [id number] Complications: polyneuropathy and foot ulcer.  Therapy: insulin since 2010.   DKA: never.  Severe hypoglycemia: never.  Pancreatitis: never.   Other: he needs a simple insulin regimen, due to ongoing noncompliance; ins declined tresiba; he takes human insulin, due to cost; he was ref DM educ, to review inject technique, but he did not go yet.   Interval history: no cbg record, but states cbg's vary from 100's-300's.  He checks in the afternoon only.  He says he seldom misses the insulin.   Past Medical History:  Diagnosis Date  . Abdominal pain 08/04/2016  . Abnormal PSA 05/19/2015  . Arthralgia 11/12/2010  . Benign prostatic hyperplasia with urinary hesitancy 06/14/2018  . CELLULITIS, FOOT, RIGHT 07/02/2008   Qualifier: Diagnosis of  By: Marca Ancona RMA, Lucy    . Cough 08/04/2016  . Diabetes ([place]) 03/04/2007   Qualifier: Diagnosis of  By: Loanne Drilling MD, Jacelyn Pi   . DIABETES MELLITUS, TYPE II 03/04/2007  . Edema 07/02/2008   Qualifier: Diagnosis of  By: Marca Ancona RMA, Lucy    . Elevated LDL cholesterol level 06/14/2018  . Foot ulcer, right ([place]) 04/15/2014  . HYPERCHOLESTEROLEMIA 07/29/2008  . HYPERTENSION 03/04/2007  . Knee pain, left 05/16/2015  . Loss of vision 04/15/2014  . Nonintractable episodic headache 12/01/2016  . Psoriasis   . SHOULDER PAIN, BILATERAL 01/12/2010   Qualifier: Diagnosis of  By: Loanne Drilling MD, Jacelyn Pi   . Swelling of left foot 08/05/2017  . 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 stress cardiolite  05/30/2003    Social History   Socioeconomic History  . Marital status: Single    Spouse name: Not on file  . Number of children: 2  . Years of education: 10  . Highest education  level: Not on file  Occupational History  . Occupation: roofer  Social Needs  . Financial resource strain: Not on file  . Food insecurity:    Worry: Not on file    Inability: Not on file  . Transportation needs:    Medical: Not on file    Non-medical: Not on file  Tobacco Use  . Smoking status: Former Smoker    Last attempt to quit: 07/26/1988    Years since quitting: 29.9  . Smokeless tobacco: Never Used  Substance and Sexual Activity  . Alcohol use: No  . Drug use: No  . Sexual activity: Not on file  Lifestyle  . Physical activity:    Days per week: Not on file    Minutes per session: Not on file  . Stress: Not on file  Relationships  . Social connections:    Talks on phone: Not on file    Gets together: Not on file    Attends religious service: Not on file    Active member of club or organization: Not on file    Attends meetings of clubs or organizations: Not on file    Relationship status: Not on file  . Intimate partner violence:    Fear of current or ex partner: Not on file    Emotionally abused: Not on file    Physically abused: Not on file  Forced sexual activity: Not on file  Other Topics Concern  . Not on file  Social History Narrative   Lives with daughter in a one story home.  Has 2 children.  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  Education: 10th grade.     Current Outpatient Medications on File Prior to Visit  Medication Sig Dispense Refill  . aspirin EC 81 MG tablet Take 1 tablet (81 mg total) by mouth daily.    [name] atorvastatin (LIPITOR) 20 MG tablet Take 1 tablet (20 mg total) by mouth daily. 90 tablet 0  . glucose blood (ACCU-CHEK GUIDE) test strip Used to check blood sugars twice daily. 100 each 12  . olmesartan-hydrochlorothiazide (BENICAR HCT) 20-12.5 MG tablet Take 0.5 tablets by mouth daily. 45 tablet 3   No current facility-administered medications on file prior to visit.     No Known Allergies  Family History  Problem Relation Age of Onset    . Cancer Mother        Breast Cancer  . Cancer Father        uncertain type  . Diabetes Father   . Cancer Brother 50       Colon Cancer    BP 136/82 (BP Location: Left Arm, Patient Position: Sitting, Cuff Size: Normal)   Pulse 64   Ht 5\' 11"  (1.803 m)   Wt 197 lb 12.8 oz (89.7 kg)   SpO2 98%   BMI 27.59 kg/m    Review of Systems He denies hypoglycemia    Objective:   Physical Exam VITAL SIGNS:  See vs page GENERAL: no distress Pulses: dorsalis pedis intact bilat.   MSK: no deformity of the feet CV: no leg edema Skin:  no ulcer on the feet, but there are heavy calluses.  normal color and temp on the feet. Neuro: sensation is intact to touch on the feet, but decreased from normal. Ext: There is bilateral onychomycosis of the toenails.    Lab Results  Component Value Date   HGBA1C 10.6 (A) 07/03/2018       Assessment & Plan:  Insulin-requiring type 2 DM, with PN: he needs increased rx.    Patient Instructions  check your blood sugar twice a day.  vary the time of day when you check, between before the 3 meals, and at bedtime.  also check if you have symptoms of your blood sugar being too high or too low.  please keep a record of the readings and bring it to your next appointment here (or you can bring the meter itself).  You can write it on any piece of paper.  please call us sooner if your blood sugar goes below 70, or if you have a lot of readings over 200.   Please see a diabetes education specialist as scheduled.   Please increase the insulin to 100 units each morning.   Please come back for a follow-up appointment in 2 months.

## 2018-07-04 ENCOUNTER — Ambulatory Visit (HOSPITAL_COMMUNITY): Payer: Medicare Other | Attending: Cardiovascular Disease

## 2018-07-04 DIAGNOSIS — R9431 Abnormal electrocardiogram [ECG] [EKG]: Secondary | ICD-10-CM | POA: Diagnosis not present

## 2018-07-04 DIAGNOSIS — R079 Chest pain, unspecified: Secondary | ICD-10-CM | POA: Insufficient documentation

## 2018-07-04 LAB — MYOCARDIAL PERFUSION IMAGING
CHL CUP NUCLEAR SRS: 0
LV dias vol: 126 mL (ref 62–150)
LV sys vol: 55 mL
Peak HR: 74 {beats}/min
Rest HR: 51 {beats}/min
SDS: 3
SSS: 3
TID: 1.15

## 2018-07-04 MED ORDER — TECHNETIUM TC 99M TETROFOSMIN IV KIT
32.5000 | PACK | Freq: Once | INTRAVENOUS | Status: AC | PRN
  Administered 2018-07-04: 32.5 via INTRAVENOUS
  Filled 2018-07-04: qty 33

## 2018-07-04 MED ORDER — REGADENOSON 0.4 MG/5ML IV SOLN
0.4000 mg | Freq: Once | INTRAVENOUS | Status: AC
  Administered 2018-07-04: 0.4 mg via INTRAVENOUS

## 2018-07-04 MED ORDER — TECHNETIUM TC 99M TETROFOSMIN IV KIT
10.2000 | PACK | Freq: Once | INTRAVENOUS | Status: AC | PRN
  Administered 2018-07-04: 10.2 via INTRAVENOUS
  Filled 2018-07-04: qty 11

## 2018-07-04 NOTE — Progress Notes (Signed)
Cardiology Office Note   Date:  07/06/2018   ID:  Scott Donaldson, DOB 10/02/1950, MRN [id number]  PCP:  Scott Maw, MD  Cardiologist:   Scott Rouge, MD   No chief complaint on file.     History of Present Illness: Scott Donaldson is a 67 y.o. male who presents for f/u regarding abnormal ECG and CAD. Referred by Dr Scott Donaldson Initially seen 06/27/18    DM poorly controlled with A1c 12.2 No history of vascular disease or CAD. 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 Anterior MI.  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 Fairly dense defect that did not clear with upright and supine Stress images so not clearly diaphragmatic attenuation  Here to discuss cardiac catheterization given symptoms, poorly controlled DM and occupation  Risks including death, bleeding contrast reaction stroke and need for emergency CABG discussed Willing to proceed will have labs today Arranged for CM next [place] Orders written    Past Medical History:  Diagnosis Date  . Abdominal pain 08/04/2016  . Abnormal PSA 05/19/2015  . Arthralgia 11/12/2010  . Benign prostatic hyperplasia with urinary hesitancy 06/14/2018  . CELLULITIS, FOOT, RIGHT 07/02/2008   Qualifier: Diagnosis of  By: Scott Donaldson RMA, Scott Donaldson    . Cough 08/04/2016  . Diabetes ([place]) 03/04/2007   Qualifier: Diagnosis of  By: Loanne Drilling MD, Scott Donaldson   . DIABETES MELLITUS, TYPE II 03/04/2007  . Edema 07/02/2008   Qualifier: Diagnosis of  By: Scott Donaldson RMA, Scott Donaldson    . Elevated LDL cholesterol level 06/14/2018  . Foot ulcer, right ([place]) 04/15/2014  . HYPERCHOLESTEROLEMIA 07/29/2008  . HYPERTENSION  03/04/2007  . Knee pain, left 05/16/2015  . Loss of vision 04/15/2014  . Nonintractable episodic headache 12/01/2016  . Psoriasis   . SHOULDER PAIN, BILATERAL 01/12/2010   Qualifier: Diagnosis of  By: Loanne Drilling MD, Scott Donaldson   . Swelling of left foot 08/05/2017  . 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 stress cardiolite  05/30/2003     Current Outpatient Medications  Medication Sig Dispense Refill  . aspirin EC 81 MG tablet Take 1 tablet (81 mg total) by mouth daily.    [name] atorvastatin (LIPITOR) 20 MG tablet Take 1 tablet (20 mg total) by mouth daily. 90 tablet 0  . glucose blood (ACCU-CHEK GUIDE) test strip Used to check blood sugars twice daily. 100 each 12  . insulin NPH Human (NOVOLIN N RELION) 100 UNIT/ML injection Inject 1 mL (100 Units total) into the skin every morning. And syringes 1/day 30 mL 11  . olmesartan-hydrochlorothiazide (BENICAR HCT) 20-12.5 MG tablet Take 0.5 tablets by mouth daily. 45 tablet 3   No current facility-administered medications for this visit.     Allergies:   Patient has no known allergies.    Social History:  The patient  reports that he quit smoking about 29 years ago. He has never used smokeless tobacco. He reports that he does  not drink alcohol or use drugs.   Family History:  The patient's family history includes Cancer in his father and mother; Cancer (age of onset: 73) in his brother; Diabetes in his father.    ROS:  Please see the history of present illness.   Otherwise, review of systems are positive for none.   All other systems are reviewed and negative.    PHYSICAL EXAM: VS:  BP 116/68   Pulse (!) 51   Ht 6' (1.829 m)   Wt 199 lb (90.3 kg)   BMI 26.99 kg/m  , BMI Body mass index is 26.99 kg/m. Affect appropriate Healthy:  appears stated age 1: normal Neck supple with no adenopathy JVP normal no bruits no thyromegaly Lungs  clear with no wheezing and good diaphragmatic motion Heart:  S1/S2 no murmur, no rub, gallop or click PMI normal Abdomen: benighn, BS positve, no tenderness, no AAA no bruit.  No HSM or HJR Distal pulses intact with no bruits No edema Neuro non-focal Skin warm and dry No muscular weakness    EKG:  06/27/18 SR rate 61 LBBB    Recent Labs: 06/14/2018: ALT 13; BUN 24; Creatinine, Ser 1.02; Hemoglobin 14.5; Platelets 202.0; Potassium 5.0; Sodium 133    Lipid Panel    Component Value Date/Time   CHOL 285 (H) 06/14/2018 1015   TRIG 266.0 (H) 06/14/2018 1015   HDL 40.90 06/14/2018 1015   CHOLHDL 7 06/14/2018 1015   VLDL 53.2 (H) 06/14/2018 1015   LDLCALC 119 (H) 05/04/2007 0751   LDLDIRECT 180.0 06/14/2018 1015      Wt Readings from Last 3 Encounters:  07/06/18 199 lb (90.3 kg)  07/04/18 197 lb (89.4 kg)  07/03/18 197 lb 12.8 oz (89.7 kg)      Other studies Reviewed: Additional studies/ records that were reviewed today include: Notes from primary ECG;s and labs .    ASSESSMENT AND PLAN:  1.  Abnormal myovue:  With symptoms and risk factors poorly controlled DM recommended cath Risks including Stroke , MI, bleeding contrast reaction and need for emergency surgery discussed willing to proceed see above 2.  HLD:  Continue statin LDL not at goal 3. HTN:  Well controlled.  Continue current medications and low sodium Dash type diet.   4. DM:  Discussed low carb diet.  Target hemoglobin A1c is 6.5 or less.  Continue current medications. 5. Dizziness:  One episode if repeats consider MRI head r/o posterior circulation stroke ASA 81 mg   Current medicines are reviewed at length with the patient today.  The patient does not have concerns regarding medicines.  The following changes have been made:  None   Labs/ tests ordered today include:  Pre cath labs   Orders Placed This Encounter  Procedures  . CBC with Differential/Platelet  . Basic metabolic panel      Disposition:   FU with cardiology  Post cath     Signed, Scott Rouge, MD  07/06/2018 11:47 AM    [name], [place], [place]  [address] Phone: [phone]; Fax: [phone]

## 2018-07-04 NOTE — H&P (View-Only) (Signed)
Cardiology Office Note   Date:  07/06/2018   ID:  Scott Donaldson, DOB [birth year], MRN [id number]  PCP:  [name], MD  Cardiologist:   Jenkins Rouge, MD   No chief complaint on file.     History of Present Illness: Scott Donaldson is a 67 y.o. male who presents for f/u regarding abnormal ECG and CAD. Referred by Dr Ethelene Hal Initially seen 06/27/18    DM poorly controlled with A1c 12.2 No history of vascular disease or CAD. 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 Anterior MI.  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 Fairly dense defect that did not clear with upright and supine Stress images so not clearly diaphragmatic attenuation  Here to discuss cardiac catheterization given symptoms, poorly controlled DM and occupation  Risks including death, bleeding contrast reaction stroke and need for emergency CABG discussed Willing to proceed will have labs today Arranged for CM next [place] Orders written    Past Medical History:  Diagnosis Date  . Abdominal pain 08/04/2016  . Abnormal PSA 05/19/2015  . Arthralgia 11/12/2010  . Benign prostatic hyperplasia with urinary hesitancy 06/14/2018  . CELLULITIS, FOOT, RIGHT 07/02/2008   Qualifier: Diagnosis of  By: Marca Ancona RMA, Lucy    . Cough 08/04/2016  . Diabetes ([place]) 03/04/2007   Qualifier: Diagnosis of  By: Loanne Drilling MD, Jacelyn Pi   . DIABETES MELLITUS, TYPE II 03/04/2007  . Edema 07/02/2008   Qualifier: Diagnosis of  By: Marca Ancona RMA, Lucy    . Elevated LDL cholesterol level 06/14/2018  . Foot ulcer, right ([place]) 04/15/2014  . HYPERCHOLESTEROLEMIA 07/29/2008  . HYPERTENSION  03/04/2007  . Knee pain, left 05/16/2015  . Loss of vision 04/15/2014  . Nonintractable episodic headache 12/01/2016  . Psoriasis   . SHOULDER PAIN, BILATERAL 01/12/2010   Qualifier: Diagnosis of  By: Loanne Drilling MD, Jacelyn Pi   . Swelling of left foot 08/05/2017  . 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 stress cardiolite  05/30/2003     Current Outpatient Medications  Medication Sig Dispense Refill  . aspirin EC 81 MG tablet Take 1 tablet (81 mg total) by mouth daily.    [name] atorvastatin (LIPITOR) 20 MG tablet Take 1 tablet (20 mg total) by mouth daily. 90 tablet 0  . glucose blood (ACCU-CHEK GUIDE) test strip Used to check blood sugars twice daily. 100 each 12  . insulin NPH Human (NOVOLIN N RELION) 100 UNIT/ML injection Inject 1 mL (100 Units total) into the skin every morning. And syringes 1/day 30 mL 11  . olmesartan-hydrochlorothiazide (BENICAR HCT) 20-12.5 MG tablet Take 0.5 tablets by mouth daily. 45 tablet 3   No current facility-administered medications for this visit.     Allergies:   Patient has no known allergies.    Social History:  The patient  reports that he quit smoking about 29 years ago. He has never used smokeless tobacco. He reports that he does  not drink alcohol or use drugs.   Family History:  The patient's family history includes Cancer in his father and mother; Cancer (age of onset: 82) in his brother; Diabetes in his father.    ROS:  Please see the history of present illness.   Otherwise, review of systems are positive for none.   All other systems are reviewed and negative.    PHYSICAL EXAM: VS:  BP 116/68   Pulse (!) 51   Ht 6' (1.829 m)   Wt 199 lb (90.3 kg)   BMI 26.99 kg/m  , BMI Body mass index is 26.99 kg/m. Affect appropriate Healthy:  appears stated age 4: normal Neck supple with no adenopathy JVP normal no bruits no thyromegaly Lungs  clear with no wheezing and good diaphragmatic motion Heart:  S1/S2 no murmur, no rub, gallop or click PMI normal Abdomen: benighn, BS positve, no tenderness, no AAA no bruit.  No HSM or HJR Distal pulses intact with no bruits No edema Neuro non-focal Skin warm and dry No muscular weakness    EKG:  06/27/18 SR rate 61 LBBB    Recent Labs: 06/14/2018: ALT 13; BUN 24; Creatinine, Ser 1.02; Hemoglobin 14.5; Platelets 202.0; Potassium 5.0; Sodium 133    Lipid Panel    Component Value Date/Time   CHOL 285 (H) 06/14/2018 1015   TRIG 266.0 (H) 06/14/2018 1015   HDL 40.90 06/14/2018 1015   CHOLHDL 7 06/14/2018 1015   VLDL 53.2 (H) 06/14/2018 1015   LDLCALC 119 (H) 05/04/2007 0751   LDLDIRECT 180.0 06/14/2018 1015      Wt Readings from Last 3 Encounters:  07/06/18 199 lb (90.3 kg)  07/04/18 197 lb (89.4 kg)  07/03/18 197 lb 12.8 oz (89.7 kg)      Other studies Reviewed: Additional studies/ records that were reviewed today include: Notes from primary ECG;s and labs .    ASSESSMENT AND PLAN:  1.  Abnormal myovue:  With symptoms and risk factors poorly controlled DM recommended cath Risks including Stroke , MI, bleeding contrast reaction and need for emergency surgery discussed willing to proceed see above 2.  HLD:  Continue statin LDL not at goal 3. HTN:  Well controlled.  Continue current medications and low sodium Dash type diet.   4. DM:  Discussed low carb diet.  Target hemoglobin A1c is 6.5 or less.  Continue current medications. 5. Dizziness:  One episode if repeats consider MRI head r/o posterior circulation stroke ASA 81 mg   Current medicines are reviewed at length with the patient today.  The patient does not have concerns regarding medicines.  The following changes have been made:  None   Labs/ tests ordered today include:  Pre cath labs   Orders Placed This Encounter  Procedures  . CBC with Differential/Platelet  . Basic metabolic panel      Disposition:   FU with cardiology  Post cath     Signed, Jenkins Rouge, MD  07/06/2018 11:47 AM    [address], [place], [place]  [address] Phone: [phone]; Fax: [phone]

## 2018-07-06 ENCOUNTER — Ambulatory Visit: Payer: Medicare Other | Admitting: Cardiovascular Disease

## 2018-07-06 ENCOUNTER — Other Ambulatory Visit: Payer: Self-pay | Admitting: Cardiovascular Disease

## 2018-07-06 VITALS — BP 116/68 | HR 51 | Ht 72.0 in | Wt 199.0 lb

## 2018-07-06 DIAGNOSIS — R079 Chest pain, unspecified: Secondary | ICD-10-CM

## 2018-07-06 DIAGNOSIS — R9431 Abnormal electrocardiogram [ECG] [EKG]: Secondary | ICD-10-CM

## 2018-07-06 DIAGNOSIS — R9439 Abnormal result of other cardiovascular function study: Secondary | ICD-10-CM

## 2018-07-06 LAB — BASIC METABOLIC PANEL
BUN / CREAT RATIO: 29 — AB (ref 10–24)
BUN: 28 mg/dL — ABNORMAL HIGH (ref 8–27)
CO2: 20 mmol/L (ref 20–29)
Calcium: 10.3 mg/dL — ABNORMAL HIGH (ref 8.6–10.2)
Chloride: 100 mmol/L (ref 96–106)
Creatinine, Ser: 0.96 mg/dL (ref 0.76–1.27)
GFR calc non Af Amer: 81 mL/min/{1.73_m2} (ref >59)
GFR, EST AFRICAN AMERICAN: 94 mL/min/{1.73_m2} (ref >59)
Glucose: 238 mg/dL — ABNORMAL HIGH (ref 65–99)
Potassium: 4.6 mmol/L (ref 3.5–5.2)
Sodium: 136 mmol/L (ref 134–144)

## 2018-07-06 LAB — CBC WITH DIFFERENTIAL/PLATELET
Basophils Absolute: 0 10*3/uL (ref 0.0–0.2)
Basos: 1 %
EOS (ABSOLUTE): 0.2 10*3/uL (ref 0.0–0.4)
Eos: 3 %
Hematocrit: 39.2 % (ref 37.5–51.0)
Hemoglobin: 13.6 g/dL (ref 13.0–17.7)
Immature Grans (Abs): 0 10*3/uL (ref 0.0–0.1)
Immature Granulocytes: 0 %
LYMPHS: 38 %
Lymphocytes Absolute: 2.6 10*3/uL (ref 0.7–3.1)
MCH: 29.4 pg (ref 26.6–33.0)
MCHC: 34.7 g/dL (ref 31.5–35.7)
MCV: 85 fL (ref 79–97)
MONOS ABS: 0.5 10*3/uL (ref 0.1–0.9)
Monocytes: 7 %
Neutrophils Absolute: 3.5 10*3/uL (ref 1.4–7.0)
Neutrophils: 51 %
Platelets: 175 10*3/uL (ref 150–450)
RBC: 4.62 x10E6/uL (ref 4.14–5.80)
RDW: 12.8 % (ref 12.3–15.4)
WBC: 6.8 10*3/uL (ref 3.4–10.8)

## 2018-07-06 NOTE — Patient Instructions (Addendum)
Medication Instructions:   If you need a refill on your cardiac medications before your next appointment, please call your pharmacy.   Lab work: Your physician recommends that you have lab work today- BMET and CBC.  If you have labs (blood work) drawn today and your tests are completely normal, you will receive your results only by: [name] MyChart Message (if you have MyChart) OR . A paper copy in the mail If you have any lab test that is abnormal or we need to change your treatment, we will call you to review the results.  Testing/Procedures: Your physician has requested that you have a cardiac catheterization. Cardiac catheterization is used to diagnose and/or treat various heart conditions. Doctors may recommend this procedure for a number of different reasons. The most common reason is to evaluate chest pain. Chest pain can be a symptom of coronary artery disease (CAD), and cardiac catheterization can show whether plaque is narrowing or blocking your heart's arteries. This procedure is also used to evaluate the valves, as well as measure the blood flow and oxygen levels in different parts of your heart. For further information please visit [employer]. Please follow instruction sheet, as given.  Follow-Up: At [facility], you and your health needs are our priority.  As part of our continuing mission to provide you with exceptional heart care, we have created designated Provider Care Teams.  These Care Teams include your primary Cardiologist (physician) and Advanced Practice Providers (APPs -  Physician Assistants and Nurse Practitioners) who all work together to provide you with the care you need, when you need it. Your physician recommends that you schedule a follow-up appointment in: 2 weeks with PA/NP.

## 2018-07-11 ENCOUNTER — Telehealth: Payer: Self-pay | Admitting: *Deleted

## 2018-07-11 NOTE — Telephone Encounter (Addendum)
Pt contacted pre-catheterization scheduled at [facility] for: Wednesday July 12, 2018 10 AM Verified arrival time and place: [place] Entrance A at: 8 AM  No solid food after midnight prior to cath, clear liquids until 5 AM day of procedure. Contrast allergy: no  Hold: Insulin-AM of procedure. 1/2 Insulin PM prior to procedure. Olmesartan-HCTZ-AM of procedure.  Except hold medications AM meds can be  taken pre-cath with sip of water including: ASA 81 mg  Confirm patient has responsible person to drive home post procedure and for 24 hours after you arrive home:yes   I reviewed instructions with patient, he verbalized understanding, states he is feeling much better, no fever.

## 2018-07-12 ENCOUNTER — Encounter (HOSPITAL_COMMUNITY): Payer: Self-pay | Admitting: Cardiovascular Disease

## 2018-07-12 ENCOUNTER — Ambulatory Visit (HOSPITAL_COMMUNITY)
Admission: RE | Admit: 2018-07-12 | Discharge: 2018-07-12 | Disposition: A | Discharge status: Home / Self Care | Payer: Medicare Other | Attending: Cardiovascular Disease | Admitting: Cardiovascular Disease

## 2018-07-12 ENCOUNTER — Ambulatory Visit: Payer: Medicare Other | Admitting: Family Medicine

## 2018-07-12 ENCOUNTER — Other Ambulatory Visit: Payer: Self-pay

## 2018-07-12 ENCOUNTER — Encounter (HOSPITAL_COMMUNITY)
Admission: RE | Disposition: A | Discharge status: Home / Self Care | Payer: Self-pay | Source: Home / Self Care | Attending: Cardiovascular Disease

## 2018-07-12 DIAGNOSIS — Z833 Family history of diabetes mellitus: Secondary | ICD-10-CM | POA: Diagnosis not present

## 2018-07-12 DIAGNOSIS — Z87891 Personal history of nicotine dependence: Secondary | ICD-10-CM | POA: Insufficient documentation

## 2018-07-12 DIAGNOSIS — Z794 Long term (current) use of insulin: Secondary | ICD-10-CM | POA: Insufficient documentation

## 2018-07-12 DIAGNOSIS — R9439 Abnormal result of other cardiovascular function study: Secondary | ICD-10-CM

## 2018-07-12 DIAGNOSIS — R3911 Hesitancy of micturition: Secondary | ICD-10-CM | POA: Diagnosis not present

## 2018-07-12 DIAGNOSIS — Z79899 Other long term (current) drug therapy: Secondary | ICD-10-CM | POA: Insufficient documentation

## 2018-07-12 DIAGNOSIS — I10 Essential (primary) hypertension: Secondary | ICD-10-CM | POA: Insufficient documentation

## 2018-07-12 DIAGNOSIS — N401 Enlarged prostate with lower urinary tract symptoms: Secondary | ICD-10-CM | POA: Insufficient documentation

## 2018-07-12 DIAGNOSIS — Z7982 Long term (current) use of aspirin: Secondary | ICD-10-CM | POA: Diagnosis not present

## 2018-07-12 DIAGNOSIS — I2510 Atherosclerotic heart disease of native coronary artery without angina pectoris: Secondary | ICD-10-CM | POA: Diagnosis not present

## 2018-07-12 DIAGNOSIS — E1165 Type 2 diabetes mellitus with hyperglycemia: Secondary | ICD-10-CM | POA: Diagnosis not present

## 2018-07-12 DIAGNOSIS — E785 Hyperlipidemia, unspecified: Secondary | ICD-10-CM | POA: Insufficient documentation

## 2018-07-12 DIAGNOSIS — I25119 Atherosclerotic heart disease of native coronary artery with unspecified angina pectoris: Secondary | ICD-10-CM | POA: Insufficient documentation

## 2018-07-12 DIAGNOSIS — R42 Dizziness and giddiness: Secondary | ICD-10-CM | POA: Diagnosis not present

## 2018-07-12 DIAGNOSIS — I447 Left bundle-branch block, unspecified: Secondary | ICD-10-CM | POA: Diagnosis not present

## 2018-07-12 DIAGNOSIS — I2584 Coronary atherosclerosis due to calcified coronary lesion: Secondary | ICD-10-CM | POA: Diagnosis not present

## 2018-07-12 DIAGNOSIS — R9431 Abnormal electrocardiogram [ECG] [EKG]: Secondary | ICD-10-CM | POA: Insufficient documentation

## 2018-07-12 HISTORY — PX: LEFT HEART CATH AND CORONARY ANGIOGRAPHY: CATH118249

## 2018-07-12 LAB — GLUCOSE, CAPILLARY: Glucose-Capillary: 294 mg/dL — ABNORMAL HIGH (ref 70–99)

## 2018-07-12 SURGERY — LEFT HEART CATH AND CORONARY ANGIOGRAPHY
Anesthesia: LOCAL

## 2018-07-12 MED ORDER — ASPIRIN 81 MG PO CHEW: 81.0000 mg | CHEWABLE_TABLET | ORAL | Status: DC

## 2018-07-12 MED ORDER — SODIUM CHLORIDE 0.9 % WEIGHT BASED INFUSION: 1.0000 mL/kg/h | INTRAVENOUS | Status: DC

## 2018-07-12 MED ORDER — ACETAMINOPHEN 325 MG PO TABS: 650.0000 mg | ORAL_TABLET | ORAL | Status: DC | PRN

## 2018-07-12 MED ORDER — MIDAZOLAM HCL 2 MG/2ML IJ SOLN
INTRAMUSCULAR | Status: DC | PRN
  Administered 2018-07-12: 1 mg via INTRAVENOUS

## 2018-07-12 MED ORDER — SODIUM CHLORIDE 0.9% FLUSH: 3.0000 mL | INTRAVENOUS | Status: DC | PRN

## 2018-07-12 MED ORDER — VERAPAMIL HCL 2.5 MG/ML IV SOLN
INTRAVENOUS | Status: AC
  Filled 2018-07-12: qty 2

## 2018-07-12 MED ORDER — LIDOCAINE HCL (PF) 1 % IJ SOLN
INTRAMUSCULAR | Status: DC | PRN
  Administered 2018-07-12: 2 mL

## 2018-07-12 MED ORDER — SODIUM CHLORIDE 0.9% FLUSH: 3.0000 mL | Freq: Two times a day (BID) | INTRAVENOUS | Status: DC

## 2018-07-12 MED ORDER — SODIUM CHLORIDE 0.9 % IV SOLN: 250.0000 mL | INTRAVENOUS | Status: DC | PRN

## 2018-07-12 MED ORDER — MIDAZOLAM HCL 2 MG/2ML IJ SOLN
INTRAMUSCULAR | Status: AC
  Filled 2018-07-12: qty 2

## 2018-07-12 MED ORDER — LIDOCAINE HCL (PF) 1 % IJ SOLN
INTRAMUSCULAR | Status: AC
  Filled 2018-07-12: qty 30

## 2018-07-12 MED ORDER — SODIUM CHLORIDE 0.9 % IV SOLN: INTRAVENOUS | Status: DC

## 2018-07-12 MED ORDER — HEPARIN SODIUM (PORCINE) 1000 UNIT/ML IJ SOLN
INTRAMUSCULAR | Status: AC
  Filled 2018-07-12: qty 1

## 2018-07-12 MED ORDER — HEPARIN (PORCINE) IN NACL 1000-0.9 UT/500ML-% IV SOLN
INTRAVENOUS | Status: DC | PRN
  Administered 2018-07-12 (×2): 500 mL

## 2018-07-12 MED ORDER — SODIUM CHLORIDE 0.9 % WEIGHT BASED INFUSION
3.0000 mL/kg/h | INTRAVENOUS | Status: AC
  Administered 2018-07-12: 3 mL/kg/h via INTRAVENOUS

## 2018-07-12 MED ORDER — FENTANYL CITRATE (PF) 100 MCG/2ML IJ SOLN
INTRAMUSCULAR | Status: DC | PRN
  Administered 2018-07-12: 50 ug via INTRAVENOUS

## 2018-07-12 MED ORDER — VERAPAMIL HCL 2.5 MG/ML IV SOLN
INTRAVENOUS | Status: DC | PRN
  Administered 2018-07-12: 10 mL via INTRA_ARTERIAL

## 2018-07-12 MED ORDER — ONDANSETRON HCL 4 MG/2ML IJ SOLN: 4.0000 mg | Freq: Four times a day (QID) | INTRAMUSCULAR | Status: DC | PRN

## 2018-07-12 MED ORDER — HEPARIN SODIUM (PORCINE) 1000 UNIT/ML IJ SOLN
INTRAMUSCULAR | Status: DC | PRN
  Administered 2018-07-12: 2000 [IU] via INTRAVENOUS

## 2018-07-12 MED ORDER — IOHEXOL 350 MG/ML SOLN
INTRAVENOUS | Status: DC | PRN
  Administered 2018-07-12: 70 mL via INTRAVENOUS

## 2018-07-12 MED ORDER — HEPARIN (PORCINE) IN NACL 1000-0.9 UT/500ML-% IV SOLN
INTRAVENOUS | Status: AC
  Filled 2018-07-12: qty 1000

## 2018-07-12 MED ORDER — FENTANYL CITRATE (PF) 100 MCG/2ML IJ SOLN
INTRAMUSCULAR | Status: AC
  Filled 2018-07-12: qty 2

## 2018-07-12 SURGICAL SUPPLY — 10 items
CATH INFINITI 5FR JK (CATHETERS) ×1 IMPLANT
DEVICE RAD COMP TR BAND LRG (VASCULAR PRODUCTS) ×1 IMPLANT
GLIDESHEATH SLEND SS 6F .021 (SHEATH) ×1 IMPLANT
GUIDEWIRE INQWIRE 1.5J.035X260 (WIRE) IMPLANT
INQWIRE 1.5J .035X260CM (WIRE) ×2
KIT HEART LEFT (KITS) ×2 IMPLANT
PACK CARDIAC CATHETERIZATION (CUSTOM PROCEDURE TRAY) ×2 IMPLANT
SHEATH PROBE COVER 6X72 (BAG) ×1 IMPLANT
TRANSDUCER W/STOPCOCK (MISCELLANEOUS) ×2 IMPLANT
TUBING CIL FLEX 10 FLL-RA (TUBING) ×2 IMPLANT

## 2018-07-12 NOTE — Interval H&P Note (Signed)
Cath Lab Visit (complete for each Cath Lab visit)  Clinical Evaluation Leading to the Procedure:   ACS: No.  Non-ACS:    Anginal Classification: CCS II  Anti-ischemic medical therapy: Minimal Therapy (1 class of medications)  Non-Invasive Test Results: Equivocal test results  Prior CABG: No previous CABG      History and Physical Interval Note:  07/12/2018 10:29 AM  Scott Donaldson  has presented today for surgery, with the diagnosis of Abnormal Stress Test  The various methods of treatment have been discussed with the patient and family. After consideration of risks, benefits and other options for treatment, the patient has consented to  Procedure(s): LEFT HEART CATH AND CORONARY ANGIOGRAPHY (N/A) as a surgical intervention .  The patient's history has been reviewed, patient examined, no change in status, stable for surgery.  I have reviewed the patient's chart and labs.  Questions were answered to the patient's satisfaction.     Kathlyn Sacramento

## 2018-07-12 NOTE — Discharge Instructions (Signed)
Radial Site Care ° °This sheet gives you information about how to care for yourself after your procedure. Your health care provider may also give you more specific instructions. If you have problems or questions, contact your health care provider. °What can I expect after the procedure? °After the procedure, it is common to have: °· Bruising and tenderness at the catheter insertion area. °Follow these instructions at home: °Medicines °· Take over-the-counter and prescription medicines only as told by your health care provider. °Insertion site care °· Follow instructions from your health care provider about how to take care of your insertion site. Make sure you: °? Wash your hands with soap and water before you change your bandage (dressing). If soap and water are not available, use hand sanitizer. °? Change your dressing as told by your health care provider. °? Leave stitches (sutures), skin glue, or adhesive strips in place. These skin closures may need to stay in place for 2 weeks or longer. If adhesive strip edges start to loosen and curl up, you may trim the loose edges. Do not remove adhesive strips completely unless your health care provider tells you to do that. °· Check your insertion site every day for signs of infection. Check for: °? Redness, swelling, or pain. °? Fluid or blood. °? Pus or a bad smell. °? Warmth. °· Do not take baths, swim, or use a hot tub until your health care provider approves. °· You may shower 24-48 hours after the procedure, or as directed by your health care provider. °? Remove the dressing and gently wash the site with plain soap and water. °? Pat the area dry with a clean towel. °? Do not rub the site. That could cause bleeding. °· Do not apply powder or lotion to the site. °Activity ° °· For 24 hours after the procedure, or as directed by your health care provider: °? Do not flex or bend the affected arm. °? Do not push or pull heavy objects with the affected arm. °? Do not  drive yourself home from the hospital or clinic. You may drive 24 hours after the procedure unless your health care provider tells you not to. °? Do not operate machinery or power tools. °· Do not lift anything that is heavier than 10 lb (4.5 kg), or the limit that you are told, until your health care provider says that it is safe. °· Ask your health care provider when it is okay to: °? Return to work or school. °? Resume usual physical activities or sports. °? Resume sexual activity. °General instructions °· If the catheter site starts to bleed, raise your arm and put firm pressure on the site. If the bleeding does not stop, get help right away. This is a medical emergency. °· If you went home on the same day as your procedure, a responsible adult should be with you for the first 24 hours after you arrive home. °· Keep all follow-up visits as told by your health care provider. This is important. °Contact a health care provider if: °· You have a fever. °· You have redness, swelling, or yellow drainage around your insertion site. °Get help right away if: °· You have unusual pain at the radial site. °· The catheter insertion area swells very fast. °· The insertion area is bleeding, and the bleeding does not stop when you hold steady pressure on the area. °· Your arm or hand becomes pale, cool, tingly, or numb. °These symptoms may represent a serious problem   that is an emergency. Do not wait to see if the symptoms will go away. Get medical help right away. Call your local emergency services (911 in the [place] Do not drive yourself to the hospital. °Summary °· After the procedure, it is common to have bruising and tenderness at the site. °· Follow instructions from your health care provider about how to take care of your radial site wound. Check the wound every day for signs of infection. °· Do not lift anything that is heavier than 10 lb (4.5 kg), or the limit that you are told, until your health care provider says  that it is safe. °This information is not intended to replace advice given to you by your health care provider. Make sure you discuss any questions you have with your health care provider. °Document Released: 08/14/2010 Document Revised: 08/17/2017 Document Reviewed: 08/17/2017 °Elsevier Interactive Patient Education © 2019 [employer] ° °

## 2018-07-17 ENCOUNTER — Encounter: Payer: Medicare Other | Attending: Endocrinology | Admitting: Dietician

## 2018-07-17 ENCOUNTER — Encounter: Payer: Self-pay | Admitting: Dietician

## 2018-07-17 DIAGNOSIS — E1165 Type 2 diabetes mellitus with hyperglycemia: Secondary | ICD-10-CM | POA: Diagnosis not present

## 2018-07-17 DIAGNOSIS — E1142 Type 2 diabetes mellitus with diabetic polyneuropathy: Secondary | ICD-10-CM

## 2018-07-17 DIAGNOSIS — H26493 Other secondary cataract, bilateral: Secondary | ICD-10-CM | POA: Diagnosis not present

## 2018-07-17 DIAGNOSIS — E113293 Type 2 diabetes mellitus with mild nonproliferative diabetic retinopathy without macular edema, bilateral: Secondary | ICD-10-CM | POA: Diagnosis not present

## 2018-07-17 DIAGNOSIS — Z794 Long term (current) use of insulin: Secondary | ICD-10-CM | POA: Insufficient documentation

## 2018-07-17 LAB — HM DIABETES EYE EXAM

## 2018-07-17 NOTE — Progress Notes (Signed)
Diabetes Self-Management Education  Visit Type: First/Initial  Appt. Start Time: 0930 Appt. End Time: 1030  07/17/2018  Scott Donaldson, identified by name and date of birth, is a 67 y.o. male with a diagnosis of Diabetes: Type 2.   ASSESSMENT Type 2 Diabetes since 1990 with insulin since 2010.  He is on NPH Novolin 70/30 due to cost and inability to take multiple daily injections.  History includes HTN, HLD, and  polyneuropathy and a foot ulcer in 2015.  He had a heart cath last week and reports no significant blockage.   He states that he is now taking his insulin regularly and only occasionally forgets this.  He is not rotating the sites enough. His last A1C was 10.6% 07/03/18 decreased from 12.2% 05/01/18. Weight 205 lbs today increased from 199 lbs last week thought secondary to increased sedentary due to increased MD appointments and rain effecting his work.  Patient lives with her daughter and 2 grandchildren.  He still works doing Consulting civil engineer.  His daughter does the cooking and shopping.  Height 6' (1.829 m), weight 205 lb (93 kg). Body mass index is 27.8 kg/m.  Diabetes Self-Management Education - 07/17/18 0939      Visit Information   Visit Type  First/Initial      Initial Visit   Diabetes Type  Type 2    Are you currently following a meal plan?  No    Are you taking your medications as prescribed?  Yes    Date Diagnosed  1990      Health Coping   How would you rate your overall health?  Good      Psychosocial Assessment   Patient Belief/Attitude about Diabetes  Motivated to manage diabetes   but also defeated and burned out   Self-care barriers  None    Self-management support  Doctor's office;Family    Other persons present  Patient;Family Member    Patient Concerns  Nutrition/Meal planning;Glycemic Control    Special Needs  None    Preferred Learning Style  No preference indicated    Learning Readiness  Ready    How often do you need to have someone help you  when you read instructions, pamphlets, or other written materials from your doctor or pharmacy?  1 - Never    What is the last grade level you completed in school?  10th grade      Pre-Education Assessment   Patient understands the diabetes disease and treatment process.  Needs Review    Patient understands incorporating nutritional management into lifestyle.  Needs Review    Patient undertands incorporating physical activity into lifestyle.  Needs Review    Patient understands using medications safely.  Needs Review    Patient understands monitoring blood glucose, interpreting and using results  Needs Review    Patient understands prevention, detection, and treatment of acute complications.  Needs Review    Patient understands prevention, detection, and treatment of chronic complications.  Needs Review    Patient understands how to develop strategies to address psychosocial issues.  Needs Review    Patient understands how to develop strategies to promote health/change behavior.  Needs Review      Complications   Last HgB A1C per patient/outside source  10.6 %   07/03/18   How often do you check your blood sugar?  1-2 times/day    Fasting Blood glucose range (mg/dL)  180-200;>200    Number of hypoglycemic episodes per month  0  Number of hyperglycemic episodes per week  14    Can you tell when your blood sugar is high?  No   going this afternoon   Have you had a dilated eye exam in the past 12 months?  No    Have you had a dental exam in the past 12 months?  No    Are you checking your feet?  Yes    How many days per week are you checking your feet?  7      Dietary Intake   Breakfast  coffee with 1/4 tsp sugar and 2% milk OR grits, eggs, 2 sauge on the weekends    Snack (morning)  occasional NABS    Lunch  [employer] or [employer] OR skips    Snack (afternoon)  none    Dinner  chicken rice casserole OR grilled meat and vegetables   avoids fried, skinnless chicken   Snack  (evening)  none    Beverage(s)  coffee, diet sprite, little water in the winter      Exercise   Exercise Type  Light (walking / raking leaves)   some roofing, does his own yard work     Patient Education   Previous Diabetes Education  Yes (please comment)   when first diagnosed   Disease state   Definition of diabetes, type 1 and 2, and the diagnosis of diabetes    Nutrition management   Role of diet in the treatment of diabetes and the relationship between the three main macronutrients and blood glucose level;Information on hints to eating out and maintain blood glucose control.;Meal options for control of blood glucose level and chronic complications.    Physical activity and exercise   Role of exercise on diabetes management, blood pressure control and cardiac health.    Medications  Reviewed patients medication for diabetes, action, purpose, timing of dose and side effects.;Other (comment)   taught site roation and alternative sites   Monitoring  Purpose and frequency of SMBG.;Identified appropriate SMBG and/or A1C goals.    Acute complications  Taught treatment of hypoglycemia - the 15 rule.    Chronic complications  Retinopathy and reason for yearly dilated eye exams    Psychosocial adjustment  Role of stress on diabetes;Worked with patient to identify barriers to care and solutions    Personal strategies to promote health  Lifestyle issues that need to be addressed for better diabetes care      Individualized Goals (developed by patient)   Nutrition  General guidelines for healthy choices and portions discussed    Physical Activity  Exercise 5-7 days per week    Medications  take my medication as prescribed    Monitoring   test my blood glucose as discussed    Problem Solving  regular meal schedule    Reducing Risk  increase portions of healthy fats    Health Coping  discuss diabetes with (comment)      Post-Education Assessment   Patient understands the diabetes disease and  treatment process.  Demonstrates understanding / competency    Patient understands incorporating nutritional management into lifestyle.  Demonstrates understanding / competency    Patient undertands incorporating physical activity into lifestyle.  Demonstrates understanding / competency    Patient understands using medications safely.  Demonstrates understanding / competency    Patient understands monitoring blood glucose, interpreting and using results  Demonstrates understanding / competency    Patient understands prevention, detection, and treatment of acute complications.  Demonstrates understanding /  competency    Patient understands prevention, detection, and treatment of chronic complications.  Demonstrates understanding / competency    Patient understands how to develop strategies to address psychosocial issues.  Demonstrates understanding / competency    Patient understands how to develop strategies to promote health/change behavior.  Needs Review      Outcomes   Expected Outcomes  Demonstrated interest in learning. Expect positive outcomes    Future DMSE  PRN    Program Status  Completed       Individualized Plan for Diabetes Self-Management Training:   Learning Objective:  Patient will have a greater understanding of diabetes self-management. Patient education plan is to attend individual and/or group sessions per assessed needs and concerns.   Plan:   Patient Instructions  Avoid skipping meals  Breakfast   Wheat toast with peanut butter, fruit   1 cup grits, egg   Egg on toast, fruit  Lunch      [place] sandwich on wheat bread, fruit, carrots   Salad with grilled chicken, fruit   Grilled chicken sandwich, fruit Stay active.  Walk or do yard work on days that you are not working. Rotate your insulin sites better Drink more water  Continue to take your insulin as prescribed Continue to test your blood sugar as recommended   Expected Outcomes:  Demonstrated interest  in learning. Expect positive outcomes  Education material provided: ADA Diabetes: Your Take Control Guide, Meal plan card, My Plate and Snack sheet  If problems or questions, patient to contact team via:  Phone  Future DSME appointment: PRN

## 2018-07-17 NOTE — Patient Instructions (Addendum)
Avoid skipping meals  Breakfast   Wheat toast with peanut butter, fruit   1 cup grits, egg   Egg on toast, fruit  Lunch      [place] sandwich on wheat bread, fruit, carrots   Salad with grilled chicken, fruit   Grilled chicken sandwich, fruit Stay active.  Walk or do yard work on days that you are not working. Rotate your insulin sites better Drink more water  Continue to take your insulin as prescribed Continue to test your blood sugar as recommended

## 2018-07-19 NOTE — Progress Notes (Signed)
Cardiology Office Note   Date:  07/21/2018   ID:  Scott Donaldson, DOB April 18, 1951, MRN [id number]  PCP:  [name], MD  Cardiologist:  Dr. [name]    Chief Complaint  Patient presents with  . Hospitalization Follow-up    post cath      History of Present Illness: Scott Donaldson is a 67 y.o. male who presents for post hospitalization post cath.   Pt with hx of DM, EF, HTN, HLD.  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 Anterior MI.  No acute changes from 08/04/16 and has full LBBB today  Myovue done 12/10 reviewed suggested inf. scar no ischemia. Fairly dense defect that did not clear with upright and supine Stress images so not clearly diaphragmatic attenuation  He had cardiac cath 07/12/18 with mild to moderate nonobstructive CAD.  normal LV systolic function.    Today he is feeling well.  No chest pain and no SOB.  His glucose is improved now in 200s with CBGs and down from the 400 he was running.  He is on a statin. And will need Hepatic and lipids in 6 weeks.  Rt. Wrist cath site is stable.  Ok to resume regular duties at work.     Past Medical History:  Diagnosis Date  . Abdominal pain 08/04/2016  . Abnormal PSA 05/19/2015  . Arthralgia 11/12/2010  . Benign prostatic hyperplasia with urinary hesitancy 06/14/2018  . CELLULITIS, FOOT, RIGHT 07/02/2008   Qualifier: Diagnosis of  By: Marca Ancona RMA, Lucy    . Cough 08/04/2016  . Diabetes ([place]) 03/04/2007   Qualifier: Diagnosis of  By: Loanne Drilling MD, Jacelyn Pi   . DIABETES MELLITUS, TYPE II 03/04/2007  . Edema 07/02/2008   Qualifier: Diagnosis of  By: Marca Ancona RMA, Lucy    . Elevated LDL cholesterol level 06/14/2018  . Foot ulcer, right ([place]) 04/15/2014  . HYPERCHOLESTEROLEMIA 07/29/2008  . HYPERTENSION 03/04/2007  . Knee pain, left 05/16/2015  . Loss of vision 04/15/2014  . Nonintractable episodic headache 12/01/2016  . Psoriasis   . SHOULDER PAIN, BILATERAL 01/12/2010   Qualifier: Diagnosis of  By:  Loanne Drilling MD, Jacelyn Pi   . Swelling of left foot 08/05/2017  . 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 LEFT HEART CATH AND CORONARY ANGIOGRAPHY N/A 07/12/2018   Procedure: LEFT HEART CATH AND CORONARY ANGIOGRAPHY;  Surgeon: Wellington Hampshire, MD;  Location: [facility];  Service: Cardiovascular;  Laterality: N/A;  . stress cardiolite  05/30/2003     Current Outpatient Medications  Medication Sig Dispense Refill  . aspirin EC 81 MG tablet Take 1 tablet (81 mg total) by mouth daily.    [name] atorvastatin (LIPITOR) 20 MG tablet Take 1 tablet (20 mg total) by mouth every evening. 90 tablet 1  . azithromycin (ZITHROMAX) 250 MG tablet Take 2 today and then 1 each day until finished. 6 tablet 0  . glucose blood (ACCU-CHEK GUIDE) test strip Used to check blood sugars twice daily. 100 each 12  . insulin NPH Human (NOVOLIN N RELION) 100 UNIT/ML injection Inject 1 mL (100 Units total) into the skin every morning. And syringes 1/day 30 mL 11  . metoprolol succinate (TOPROL-XL) 25 MG 24 hr tablet Take 1 tablet (25 mg total) by mouth daily. 90 tablet 1  . olmesartan (BENICAR) 20 MG  tablet Take 1 tablet (20 mg total) by mouth daily. 90 tablet 1   No current facility-administered medications for this visit.     Allergies:   Patient has no known allergies.    Social History:  The patient  reports that he quit smoking about 30 years ago. He has never used smokeless tobacco. He reports that he does not drink alcohol or use drugs.   Family History:  The patient's family history includes Cancer in his father and mother; Cancer (age of onset: 17) in his brother; Diabetes in his father.    ROS:  General:no colds or fevers, no weight changes Skin:no rashes or ulcers HEENT:no blurred vision, no congestion CV:see HPI PUL:see HPI GI:no diarrhea constipation or melena, no indigestion GU:no hematuria,  no dysuria MS:no joint pain, no claudication Neuro:no syncope, no lightheadedness Endo:no diabetes, no thyroid disease Wt Readings from Last 3 Encounters:  07/21/18 204 lb 1.9 oz (92.6 kg)  07/20/18 205 lb 8 oz (93.2 kg)  07/17/18 205 lb (93 kg)     PHYSICAL EXAM: VS:  BP 122/60   Pulse 67   Ht 6' (1.829 m)   Wt 204 lb 1.9 oz (92.6 kg)   SpO2 96%   BMI 27.68 kg/m  , BMI Body mass index is 27.68 kg/m. General:Pleasant affect, NAD Skin:Warm and dry, brisk capillary refill HEENT:normocephalic, sclera clear, mucus membranes moist Neck:supple, no JVD, no bruits  Heart:S1S2 RRR without murmur, gallup, rub or click, Rt wrist cath site stable. Lungs:clear without rales, rhonchi, or wheezes HEN:IDPO, non tender, + BS, do not palpate liver spleen or masses Ext:no lower ext edema, 2+ pedal pulses, 2+ radial pulses Neuro:alert and oriented X 3, MAE, follows commands, + facial symmetry    EKG:  EKG is NOT ordered today.   Recent Labs: 06/14/2018: ALT 13 07/06/2018: BUN 28; Creatinine, Ser 0.96; Hemoglobin 13.6; Platelets 175; Potassium 4.6; Sodium 136    Lipid Panel    Component Value Date/Time   CHOL 285 (H) 06/14/2018 1015   TRIG 266.0 (H) 06/14/2018 1015   HDL 40.90 06/14/2018 1015   CHOLHDL 7 06/14/2018 1015   VLDL 53.2 (H) 06/14/2018 1015   LDLCALC 119 (H) 05/04/2007 0751   LDLDIRECT 180.0 06/14/2018 1015       Other studies Reviewed: Additional studies/ records that were reviewed today include:.  Cardiac cath 07/12/18  The left ventricular systolic function is normal.  LV end diastolic pressure is mildly elevated.  The left ventricular ejection fraction is 55-65% by visual estimate.  Prox RCA lesion is 20% stenosed.  Dist RCA lesion is 40% stenosed.  Dist LM lesion is 20% stenosed.  Mid LAD lesion is 40% stenosed.   1.  Mild to moderate nonobstructive coronary artery disease.  Mildly calcified vessels overall. 2.  Normal LV systolic function mildly  elevated left ventricular end-diastolic pressure.  Recommendations: Continue aggressive medical therapy and treatment of risk factors.   NUC 07/04/18 Study Highlights     The left ventricular ejection fraction is normal (55-65%).  Nuclear stress EF: 56%.  There was no ST segment deviation noted during stress.  Findings consistent with prior myocardial infarction.  This is a low risk study.  Blood pressure demonstrated a normal response to exercise.   Possible small inferior wall infarct from apex to base no ischemia Defect persists in upright and supine stress images No RWMA;s EF normal 56% Low risk study      ASSESSMENT AND PLAN:  1.  Atypical chest pain with  LBBB on EKG and abnormal myoview--and with multiple risk factors underwent cardiac cath and fortunately only non obstructive CAD.   Max 40% RCA and 40% LAD.  New LDL goal of < 70, he is on statin and will check hepatic and lipids in 6-8 weeks.  Follow up with Dr. [name] in 6 months then maybe PRN.  His diet is much improved.  His daughter is helping with his food.   2.  DM-2 per PCP and Dr. Loanne Drilling improving.    3.  HTN continue current meds.    Current medicines are reviewed with the patient today.  The patient Has no concerns regarding medicines.  The following changes have been made:  See above Labs/ tests ordered today include:see above  Disposition:   FU:  see above  Signed, [name], NP  07/21/2018 12:09 PM    [address], [place], [place], [place] Phone: [phone]; Fax: [phone]

## 2018-07-20 ENCOUNTER — Ambulatory Visit: Payer: Medicare Other | Admitting: Family Medicine

## 2018-07-20 ENCOUNTER — Encounter: Payer: Self-pay | Admitting: Family Medicine

## 2018-07-20 VITALS — BP 110/70 | HR 61 | Temp 98.5°F | Ht 72.0 in | Wt 205.5 lb

## 2018-07-20 DIAGNOSIS — E1142 Type 2 diabetes mellitus with diabetic polyneuropathy: Secondary | ICD-10-CM | POA: Diagnosis not present

## 2018-07-20 DIAGNOSIS — I10 Essential (primary) hypertension: Secondary | ICD-10-CM

## 2018-07-20 DIAGNOSIS — E7800 Pure hypercholesterolemia, unspecified: Secondary | ICD-10-CM | POA: Diagnosis not present

## 2018-07-20 DIAGNOSIS — J40 Bronchitis, not specified as acute or chronic: Secondary | ICD-10-CM | POA: Insufficient documentation

## 2018-07-20 DIAGNOSIS — Z794 Long term (current) use of insulin: Secondary | ICD-10-CM

## 2018-07-20 DIAGNOSIS — I2510 Atherosclerotic heart disease of native coronary artery without angina pectoris: Secondary | ICD-10-CM

## 2018-07-20 MED ORDER — AZITHROMYCIN 250 MG PO TABS: ORAL_TABLET | ORAL | 0 refills | Status: DC

## 2018-07-20 MED ORDER — OLMESARTAN MEDOXOMIL 20 MG PO TABS: 20.0000 mg | ORAL_TABLET | Freq: Every day | ORAL | 1 refills | Status: DC

## 2018-07-20 MED ORDER — METOPROLOL SUCCINATE ER 25 MG PO TB24: 25.0000 mg | ORAL_TABLET | Freq: Every day | ORAL | 1 refills | Status: DC

## 2018-07-20 MED ORDER — ATORVASTATIN CALCIUM 20 MG PO TABS: 20.0000 mg | ORAL_TABLET | Freq: Every evening | ORAL | 1 refills | Status: DC

## 2018-07-20 NOTE — Progress Notes (Signed)
Established Patient Office Visit  Subjective:  Patient ID: Scott Donaldson, male    DOB: February 03, 1951  Age: 67 y.o. MRN: [id number]  CC:  Chief Complaint  Patient presents with  . Follow-up  . Cough    x 3 weeks, productive cough with yellow mucus, no fever.     HPI Scott Donaldson presents for follow-up of his hypertension, elevated cholesterol and with a new diagnosis of coronary artery disease involving all 3 of his major vessels.  He tells me he did not really receive a stent.  He is aware that lesions will be treated medically.  Stressed the importance of tight blood pressure control, control of diabetes and control of his cholesterol as well.  He seeing Dr. Ebony Hail for control of his diabetes.  He is taking a statin.  He is aware that his LDL cholesterol will need to become 70 or less.  Blood pressure is low normal on Benicar/80 they are taking half a pill.  Pill crumbles when they try to cut it.  He is on no medicine right now for cardioprotection.  He denies angina pain at this time.  He has had a cough now productive of yellow phlegm for the last 3 weeks.  He is having nasal congestion without facial pressure or teeth pain there is been no fevers chills or wheezing.  Quit smoking back in 1990.  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  He is accompanied by his daughter today.  Past Medical History:  Diagnosis Date  . Abdominal pain 08/04/2016  . Abnormal PSA 05/19/2015  . Arthralgia 11/12/2010  . Benign prostatic hyperplasia with urinary hesitancy 06/14/2018  . CELLULITIS, FOOT, RIGHT 07/02/2008   Qualifier: Diagnosis of  By: Marca Ancona RMA, Lucy    . Cough 08/04/2016  . Diabetes ([place]) 03/04/2007   Qualifier: Diagnosis of  By: Loanne Drilling MD, Jacelyn Pi   . DIABETES MELLITUS, TYPE II 03/04/2007  . Edema 07/02/2008   Qualifier: Diagnosis of  By: Marca Ancona RMA, Lucy    . Elevated LDL cholesterol level 06/14/2018  . Foot ulcer, right ([place]) 04/15/2014  . HYPERCHOLESTEROLEMIA 07/29/2008  . HYPERTENSION  03/04/2007  . Knee pain, left 05/16/2015  . Loss of vision 04/15/2014  . Nonintractable episodic headache 12/01/2016  . Psoriasis   . SHOULDER PAIN, BILATERAL 01/12/2010   Qualifier: Diagnosis of  By: Loanne Drilling MD, Jacelyn Pi   . Swelling of left foot 08/05/2017  . 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 LEFT HEART CATH AND CORONARY ANGIOGRAPHY N/A 07/12/2018   Procedure: LEFT HEART CATH AND CORONARY ANGIOGRAPHY;  Surgeon: Wellington Hampshire, MD;  Location: [facility];  Service: Cardiovascular;  Laterality: N/A;  . stress cardiolite  05/30/2003    Family History  Problem Relation Age of Onset  . Cancer Mother        Breast Cancer  . Cancer Father        uncertain type  . Diabetes Father   . Cancer Brother 51       Colon Cancer    Social History   Socioeconomic History  . Marital status: Single    Spouse name: Not on file  . Number of children: 2  . Years of education: 10  . Highest education level: Not on file  Occupational History  . Occupation: roofer  Social Needs  . Financial resource strain:  Not on file  . Food insecurity:    Worry: Not on file    Inability: Not on file  . Transportation needs:    Medical: Not on file    Non-medical: Not on file  Tobacco Use  . Smoking status: Former Smoker    Last attempt to quit: 07/26/1988    Years since quitting: 30.0  . Smokeless tobacco: Never Used  Substance and Sexual Activity  . Alcohol use: No  . Drug use: No  . Sexual activity: Not on file  Lifestyle  . Physical activity:    Days per week: Not on file    Minutes per session: Not on file  . Stress: Not on file  Relationships  . Social connections:    Talks on phone: Not on file    Gets together: Not on file    Attends religious service: Not on file    Active member of club or organization: Not on file    Attends meetings of clubs or organizations: Not on  file    Relationship status: Not on file  . Intimate partner violence:    Fear of current or ex partner: Not on file    Emotionally abused: Not on file    Physically abused: Not on file    Forced sexual activity: Not on file  Other Topics Concern  . Not on file  Social History Narrative   Lives with daughter in a one story home.  Has 2 children.  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  Education: 10th grade.     Outpatient Medications Prior to Visit  Medication Sig Dispense Refill  . aspirin EC 81 MG tablet Take 1 tablet (81 mg total) by mouth daily.    [name] glucose blood (ACCU-CHEK GUIDE) test strip Used to check blood sugars twice daily. 100 each 12  . insulin NPH Human (NOVOLIN N RELION) 100 UNIT/ML injection Inject 1 mL (100 Units total) into the skin every morning. And syringes 1/day (Patient taking differently: Inject 50 Units into the skin 2 (two) times daily. And syringes 1/day) 30 mL 11  . atorvastatin (LIPITOR) 20 MG tablet Take 1 tablet (20 mg total) by mouth daily. (Patient taking differently: Take 20 mg by mouth every evening. ) 90 tablet 0  . olmesartan-hydrochlorothiazide (BENICAR HCT) 20-12.5 MG tablet Take 0.5 tablets by mouth daily. (Patient taking differently: Take 0.5 tablets by mouth every evening. ) 45 tablet 3   No facility-administered medications prior to visit.     No Known Allergies  ROS Review of Systems  Constitutional: Negative for chills, diaphoresis, fatigue, fever and unexpected weight change.  HENT: Positive for congestion. Negative for dental problem, postnasal drip, rhinorrhea and sinus pain.   Eyes: Negative for photophobia and visual disturbance.  Respiratory: Positive for cough. Negative for chest tightness, shortness of breath and wheezing.   Cardiovascular: Negative.  Negative for chest pain.  Gastrointestinal: Negative.   Endocrine: Negative for polyphagia and polyuria.  Genitourinary: Negative.   Musculoskeletal: Negative for arthralgias and  myalgias.  Skin: Negative for pallor.  Allergic/Immunologic: Negative for immunocompromised state.  Hematological: Negative.   Psychiatric/Behavioral: Negative.       Objective:    Physical Exam  Constitutional: He is oriented to person, place, and time. He appears well-developed and well-nourished. No distress.  HENT:  Head: Normocephalic and atraumatic.  Right Ear: External ear normal.  Left Ear: External ear normal.  Eyes: Pupils are equal, round, and reactive to light. Conjunctivae are normal. Right  eye exhibits no discharge. Left eye exhibits no discharge. No scleral icterus.  Neck: Neck supple. No JVD present. No tracheal deviation present. No thyromegaly present.  Cardiovascular: Normal rate, regular rhythm and normal heart sounds.  Pulmonary/Chest: Effort normal and breath sounds normal. No stridor.  Abdominal: Bowel sounds are normal.  Lymphadenopathy:    He has no cervical adenopathy.  Neurological: He is alert and oriented to person, place, and time.  Skin: Skin is warm and dry. He is not diaphoretic.  Psychiatric: He has a normal mood and affect. His behavior is normal.    BP 110/70   Pulse 61   Temp 98.5 F (36.9 C) (Oral)   Ht 6' (1.829 m)   Wt 205 lb 8 oz (93.2 kg)   SpO2 97%   BMI 27.87 kg/m  Wt Readings from Last 3 Encounters:  07/20/18 205 lb 8 oz (93.2 kg)  07/17/18 205 lb (93 kg)  07/12/18 198 lb (89.8 kg)   BP Readings from Last 3 Encounters:  07/20/18 110/70  07/12/18 125/63  07/06/18 116/68   Guideline developer:  UpToDate (see UpToDate for funding source) Date Released: June 2014  Health Maintenance Due  Topic Date Due  . COLONOSCOPY  11/12/2014  . OPHTHALMOLOGY EXAM  08/05/2017    There are no preventive care reminders to display for this patient.  Lab Results  Component Value Date   TSH 1.18 08/04/2016   Lab Results  Component Value Date   WBC 6.8 07/06/2018   HGB 13.6 07/06/2018   HCT 39.2 07/06/2018   MCV 85 07/06/2018    PLT 175 07/06/2018   Lab Results  Component Value Date   NA 136 07/06/2018   K 4.6 07/06/2018   CO2 20 07/06/2018   GLUCOSE 238 (H) 07/06/2018   BUN 28 (H) 07/06/2018   CREATININE 0.96 07/06/2018   BILITOT 0.5 06/14/2018   ALKPHOS 62 06/14/2018   AST 14 06/14/2018   ALT 13 06/14/2018   PROT 7.7 06/14/2018   ALBUMIN 4.6 06/14/2018   CALCIUM 10.3 (H) 07/06/2018   GFR 77.18 06/14/2018   Lab Results  Component Value Date   CHOL 285 (H) 06/14/2018   Lab Results  Component Value Date   HDL 40.90 06/14/2018   Lab Results  Component Value Date   LDLCALC 119 (H) 05/04/2007   Lab Results  Component Value Date   TRIG 266.0 (H) 06/14/2018   Lab Results  Component Value Date   CHOLHDL 7 06/14/2018   Lab Results  Component Value Date   HGBA1C 10.6 (A) 07/03/2018      Assessment & Plan:   Problem List Items Addressed This Visit      Cardiovascular and Mediastinum   Essential hypertension - Primary   Relevant Medications   olmesartan (BENICAR) 20 MG tablet   metoprolol succinate (TOPROL-XL) 25 MG 24 hr tablet   atorvastatin (LIPITOR) 20 MG tablet   Coronary artery disease involving native heart   Relevant Medications   olmesartan (BENICAR) 20 MG tablet   metoprolol succinate (TOPROL-XL) 25 MG 24 hr tablet   atorvastatin (LIPITOR) 20 MG tablet     Respiratory   Bronchitis   Relevant Medications   azithromycin (ZITHROMAX) 250 MG tablet     Endocrine   Diabetes (HCC)   Relevant Medications   olmesartan (BENICAR) 20 MG tablet   atorvastatin (LIPITOR) 20 MG tablet     Other   HYPERCHOLESTEROLEMIA   Relevant Medications   olmesartan (BENICAR) 20 MG tablet  metoprolol succinate (TOPROL-XL) 25 MG 24 hr tablet   atorvastatin (LIPITOR) 20 MG tablet   Elevated LDL cholesterol level   Relevant Medications   atorvastatin (LIPITOR) 20 MG tablet      Meds ordered this encounter  Medications  . olmesartan (BENICAR) 20 MG tablet    Sig: Take 1 tablet (20 mg  total) by mouth daily.    Dispense:  90 tablet    Refill:  1  . azithromycin (ZITHROMAX) 250 MG tablet    Sig: Take 2 today and then 1 each day until finished.    Dispense:  6 tablet    Refill:  0  . metoprolol succinate (TOPROL-XL) 25 MG 24 hr tablet    Sig: Take 1 tablet (25 mg total) by mouth daily.    Dispense:  90 tablet    Refill:  1  . atorvastatin (LIPITOR) 20 MG tablet    Sig: Take 1 tablet (20 mg total) by mouth every evening.    Dispense:  90 tablet    Refill:  1    Follow-up: Return in about 1 month (around 08/20/2018).   Have switched patient to plain Benicar without HCTZ.  Added metoprolol low-dose for cardioprotection.  Patient will continue atorvastatin 20 mg daily for now.  He will be following up with cardiology tomorrow.  Follow-up with me in 1 month.

## 2018-07-21 ENCOUNTER — Encounter: Payer: Self-pay | Admitting: Cardiology

## 2018-07-21 ENCOUNTER — Ambulatory Visit: Payer: Medicare Other | Admitting: Cardiology

## 2018-07-21 VITALS — BP 122/60 | HR 67 | Ht 72.0 in | Wt 204.1 lb

## 2018-07-21 DIAGNOSIS — Z794 Long term (current) use of insulin: Secondary | ICD-10-CM | POA: Diagnosis not present

## 2018-07-21 DIAGNOSIS — I2510 Atherosclerotic heart disease of native coronary artery without angina pectoris: Secondary | ICD-10-CM | POA: Diagnosis not present

## 2018-07-21 DIAGNOSIS — E7800 Pure hypercholesterolemia, unspecified: Secondary | ICD-10-CM

## 2018-07-21 DIAGNOSIS — E119 Type 2 diabetes mellitus without complications: Secondary | ICD-10-CM | POA: Diagnosis not present

## 2018-07-21 NOTE — Patient Instructions (Signed)
Medication Instructions:  Your physician recommends that you continue on your current medications as directed. Please refer to the Current Medication list given to you today.  If you need a refill on your cardiac medications before your next appointment, please call your pharmacy.   Lab work: 8 WEEKS:  FASTING LIPID & LFT  If you have labs (blood work) drawn today and your tests are completely normal, you will receive your results only by: [name] MyChart Message (if you have MyChart) OR . A paper copy in the mail If you have any lab test that is abnormal or we need to change your treatment, we will call you to review the results.  Testing/Procedures: None ordered  Follow-Up: At [facility], you and your health needs are our priority.  As part of our continuing mission to provide you with exceptional heart care, we have created designated Provider Care Teams.  These Care Teams include your primary Cardiologist (physician) and Advanced Practice Providers (APPs -  Physician Assistants and Nurse Practitioners) who all work together to provide you with the care you need, when you need it. You will need a follow up appointment in 6 months.  Please call our office 2 months in advance to schedule this appointment.  You may see Dr. [name]  or one of the following Advanced Practice Providers on your designated Care Team:   Truitt Merle, NP [name], NP . Kathyrn Drown, NP  Any Other Special Instructions Will Be Listed Below (If Applicable).

## 2018-07-25 ENCOUNTER — Encounter: Payer: Self-pay | Admitting: Family Medicine

## 2018-08-03 ENCOUNTER — Encounter: Payer: Self-pay | Admitting: Endocrinology

## 2018-08-04 NOTE — Telephone Encounter (Signed)
Please review pt concern and advise how to proceed. Dr. Loanne Drilling will not be in the office today and will not be addressing questions/concerns until tomorrow. Thank you

## 2018-08-07 NOTE — Telephone Encounter (Signed)
Please advise 

## 2018-08-08 ENCOUNTER — Encounter: Payer: Self-pay | Admitting: Family Medicine

## 2018-08-21 ENCOUNTER — Encounter: Payer: Self-pay | Admitting: Gastroenterology

## 2018-08-21 ENCOUNTER — Ambulatory Visit (INDEPENDENT_AMBULATORY_CARE_PROVIDER_SITE_OTHER): Payer: Medicare Other | Admitting: Family Medicine

## 2018-08-21 ENCOUNTER — Encounter: Payer: Self-pay | Admitting: Family Medicine

## 2018-08-21 ENCOUNTER — Ambulatory Visit (INDEPENDENT_AMBULATORY_CARE_PROVIDER_SITE_OTHER): Payer: Medicare Other

## 2018-08-21 VITALS — BP 136/70 | HR 73 | Ht 72.0 in | Wt 204.5 lb

## 2018-08-21 DIAGNOSIS — I10 Essential (primary) hypertension: Secondary | ICD-10-CM

## 2018-08-21 DIAGNOSIS — G8929 Other chronic pain: Secondary | ICD-10-CM

## 2018-08-21 DIAGNOSIS — M1712 Unilateral primary osteoarthritis, left knee: Secondary | ICD-10-CM | POA: Diagnosis not present

## 2018-08-21 DIAGNOSIS — D126 Benign neoplasm of colon, unspecified: Secondary | ICD-10-CM | POA: Diagnosis not present

## 2018-08-21 DIAGNOSIS — M25562 Pain in left knee: Secondary | ICD-10-CM | POA: Diagnosis not present

## 2018-08-21 DIAGNOSIS — E7800 Pure hypercholesterolemia, unspecified: Secondary | ICD-10-CM | POA: Diagnosis not present

## 2018-08-21 DIAGNOSIS — R972 Elevated prostate specific antigen [PSA]: Secondary | ICD-10-CM | POA: Diagnosis not present

## 2018-08-21 DIAGNOSIS — I2510 Atherosclerotic heart disease of native coronary artery without angina pectoris: Secondary | ICD-10-CM | POA: Diagnosis not present

## 2018-08-21 DIAGNOSIS — Z1211 Encounter for screening for malignant neoplasm of colon: Secondary | ICD-10-CM

## 2018-08-21 LAB — URINALYSIS, ROUTINE W REFLEX MICROSCOPIC
BILIRUBIN URINE: NEGATIVE
Hgb urine dipstick: NEGATIVE
Ketones, ur: NEGATIVE
Leukocytes, UA: NEGATIVE
NITRITE: NEGATIVE
RBC / HPF: NONE SEEN (ref 0–?)
Specific Gravity, Urine: 1.02 (ref 1.000–1.030)
UROBILINOGEN UA: 0.2 (ref 0.0–1.0)
Urine Glucose: NEGATIVE
pH: 5 (ref 5.0–8.0)

## 2018-08-21 LAB — CBC
HCT: 38.4 % — ABNORMAL LOW (ref 39.0–52.0)
Hemoglobin: 13 g/dL (ref 13.0–17.0)
MCHC: 33.9 g/dL (ref 30.0–36.0)
MCV: 85.5 fl (ref 78.0–100.0)
Platelets: 198 10*3/uL (ref 150.0–400.0)
RBC: 4.49 Mil/uL (ref 4.22–5.81)
RDW: 13.9 % (ref 11.5–15.5)
WBC: 4.6 10*3/uL (ref 4.0–10.5)

## 2018-08-21 LAB — COMPREHENSIVE METABOLIC PANEL
ALK PHOS: 50 U/L (ref 39–117)
ALT: 18 U/L (ref 0–53)
AST: 25 U/L (ref 0–37)
Albumin: 4.4 g/dL (ref 3.5–5.2)
BUN: 24 mg/dL — ABNORMAL HIGH (ref 6–23)
CALCIUM: 9.4 mg/dL (ref 8.4–10.5)
CO2: 24 mEq/L (ref 19–32)
Chloride: 106 mEq/L (ref 96–112)
Creatinine, Ser: 1.03 mg/dL (ref 0.40–1.50)
GFR: 71.76 mL/min (ref >60.00)
Glucose, Bld: 145 mg/dL — ABNORMAL HIGH (ref 70–99)
Potassium: 4.2 mEq/L (ref 3.5–5.1)
Sodium: 139 mEq/L (ref 135–145)
Total Bilirubin: 0.6 mg/dL (ref 0.2–1.2)
Total Protein: 7.1 g/dL (ref 6.0–8.3)

## 2018-08-21 LAB — MICROALBUMIN / CREATININE URINE RATIO
Creatinine,U: 150.6 mg/dL
Microalb Creat Ratio: 8.3 mg/g (ref 0.0–30.0)
Microalb, Ur: 12.5 mg/dL — ABNORMAL HIGH (ref 0.0–1.9)

## 2018-08-21 LAB — LIPID PANEL
Cholesterol: 165 mg/dL (ref 0–200)
HDL: 45.8 mg/dL (ref >39.00)
LDL Cholesterol: 104 mg/dL — ABNORMAL HIGH (ref 0–99)
NonHDL: 119.68
Total CHOL/HDL Ratio: 4
Triglycerides: 77 mg/dL (ref 0.0–149.0)
VLDL: 15.4 mg/dL (ref 0.0–40.0)

## 2018-08-21 LAB — PSA: PSA: 6.2 ng/mL — ABNORMAL HIGH (ref 0.10–4.00)

## 2018-08-21 LAB — LDL CHOLESTEROL, DIRECT: Direct LDL: 102 mg/dL

## 2018-08-21 MED ORDER — DICLOFENAC SODIUM 1 % TD GEL: TRANSDERMAL | 1 refills | Status: DC

## 2018-08-21 NOTE — Progress Notes (Signed)
Established Patient Office Visit  Subjective:  Patient ID: Scott Donaldson, male    DOB: 1950/10/08  Age: 68 y.o. MRN: [id number]  CC:  Chief Complaint  Patient presents with  . Follow-up    HPI Scott Donaldson presents for follow-up of his hypertension that is been well controlled with a Benicar and metoprolol.  He is currently taking Lipitor 20 for his elevated cholesterol associated with coronary artery disease.  LDL goal would be less than 70.  He does experience some urinary hesitancy and has not seen urology yet for his stable but elevated PSA.  Tells of his significant family history of cancer in his family with brothers and mother being affected.  He is not certain as to what cancers they specifically had.  He knows that he is due for colonoscopy.  Having no issues with current medicines.  He has for something for sore knee.  Past Medical History:  Diagnosis Date  . Abdominal pain 08/04/2016  . Abnormal PSA 05/19/2015  . Arthralgia 11/12/2010  . Benign prostatic hyperplasia with urinary hesitancy 06/14/2018  . CELLULITIS, FOOT, RIGHT 07/02/2008   Qualifier: Diagnosis of  By: Marca Ancona RMA, Lucy    . Cough 08/04/2016  . Diabetes ([place]) 03/04/2007   Qualifier: Diagnosis of  By: Loanne Drilling MD, Jacelyn Pi   . DIABETES MELLITUS, TYPE II 03/04/2007  . Edema 07/02/2008   Qualifier: Diagnosis of  By: Marca Ancona RMA, Lucy    . Elevated LDL cholesterol level 06/14/2018  . Foot ulcer, right ([place]) 04/15/2014  . HYPERCHOLESTEROLEMIA 07/29/2008  . HYPERTENSION 03/04/2007  . Knee pain, left 05/16/2015  . Loss of vision 04/15/2014  . Nonintractable episodic headache 12/01/2016  . Psoriasis   . SHOULDER PAIN, BILATERAL 01/12/2010   Qualifier: Diagnosis of  By: Loanne Drilling MD, Jacelyn Pi   . Swelling of left foot 08/05/2017  . 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 LEFT HEART CATH AND CORONARY ANGIOGRAPHY N/A  07/12/2018   Procedure: LEFT HEART CATH AND CORONARY ANGIOGRAPHY;  Surgeon: Wellington Hampshire, MD;  Location: [facility];  Service: Cardiovascular;  Laterality: N/A;  . stress cardiolite  05/30/2003    Family History  Problem Relation Age of Onset  . Cancer Mother        Breast Cancer  . Cancer Father        uncertain type  . Diabetes Father   . Cancer Brother 64       Colon Cancer    Social History   Socioeconomic History  . Marital status: Single    Spouse name: Not on file  . Number of children: 2  . Years of education: 10  . Highest education level: Not on file  Occupational History  . Occupation: roofer  Social Needs  . Financial resource strain: Not on file  . Food insecurity:    Worry: Not on file    Inability: Not on file  . Transportation needs:    Medical: Not on file    Non-medical: Not on file  Tobacco Use  . Smoking status: Former Smoker    Last attempt to quit: 07/26/1988    Years since quitting: 30.0  . Smokeless tobacco: Never Used  Substance and Sexual Activity  . Alcohol use: No  . Drug use: No  . Sexual activity: Not on file  Lifestyle  . Physical activity:  Days per week: Not on file    Minutes per session: Not on file  . Stress: Not on file  Relationships  . Social connections:    Talks on phone: Not on file    Gets together: Not on file    Attends religious service: Not on file    Active member of club or organization: Not on file    Attends meetings of clubs or organizations: Not on file    Relationship status: Not on file  . Intimate partner violence:    Fear of current or ex partner: Not on file    Emotionally abused: Not on file    Physically abused: Not on file    Forced sexual activity: Not on file  Other Topics Concern  . Not on file  Social History Narrative   Lives with daughter in a one story home.  Has 2 children.  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  Education: 10th grade.     Outpatient Medications Prior to  Visit  Medication Sig Dispense Refill  . aspirin EC 81 MG tablet Take 1 tablet (81 mg total) by mouth daily.    [name] atorvastatin (LIPITOR) 20 MG tablet Take 1 tablet (20 mg total) by mouth every evening. 90 tablet 1  . azithromycin (ZITHROMAX) 250 MG tablet Take 2 today and then 1 each day until finished. 6 tablet 0  . glucose blood (ACCU-CHEK GUIDE) test strip Used to check blood sugars twice daily. 100 each 12  . insulin NPH Human (NOVOLIN N RELION) 100 UNIT/ML injection Inject 1 mL (100 Units total) into the skin every morning. And syringes 1/day 30 mL 11  . metoprolol succinate (TOPROL-XL) 25 MG 24 hr tablet Take 1 tablet (25 mg total) by mouth daily. 90 tablet 1  . olmesartan (BENICAR) 20 MG tablet Take 1 tablet (20 mg total) by mouth daily. 90 tablet 1   No facility-administered medications prior to visit.     No Known Allergies  ROS Review of Systems  Constitutional: Negative for chills, diaphoresis, fatigue, fever and unexpected weight change.  HENT: Negative.   Eyes: Negative for photophobia and visual disturbance.  Respiratory: Negative.  Negative for shortness of breath.   Cardiovascular: Negative.  Negative for chest pain and palpitations.  Gastrointestinal: Negative.   Endocrine: Negative for polyphagia and polyuria.  Genitourinary: Positive for difficulty urinating.  Musculoskeletal: Positive for arthralgias and gait problem.  Skin: Negative for pallor and rash.  Allergic/Immunologic: Negative for immunocompromised state.  Neurological: Negative for seizures and numbness.  Hematological: Does not bruise/bleed easily.  Psychiatric/Behavioral: Negative.       Objective:    Physical Exam  Constitutional: He is oriented to person, place, and time. He appears well-developed and well-nourished. No distress.  HENT:  Head: Normocephalic and atraumatic.  Right Ear: External ear normal.  Left Ear: External ear normal.  Mouth/Throat: Oropharynx is clear and moist. No  oropharyngeal exudate.  Eyes: Conjunctivae are normal. Right eye exhibits no discharge. Left eye exhibits no discharge. No scleral icterus.  Neck: Neck supple. No JVD present. No tracheal deviation present. No thyromegaly present.  Cardiovascular: Normal rate and regular rhythm.  Murmur heard. Pulmonary/Chest: Effort normal and breath sounds normal. No stridor.  Musculoskeletal:     Left knee: Tenderness found. Lateral joint line tenderness noted.  Lymphadenopathy:    He has no cervical adenopathy.  Neurological: He is alert and oriented to person, place, and time.  Skin: Skin is warm and dry. He is not  diaphoretic.  Psychiatric: He has a normal mood and affect. His behavior is normal.    BP 136/70   Pulse 73   Ht 6' (1.829 m)   Wt 204 lb 8 oz (92.8 kg)   SpO2 95%   BMI 27.74 kg/m  Wt Readings from Last 3 Encounters:  08/21/18 204 lb 8 oz (92.8 kg)  07/21/18 204 lb 1.9 oz (92.6 kg)  07/20/18 205 lb 8 oz (93.2 kg)   BP Readings from Last 3 Encounters:  08/21/18 136/70  07/21/18 122/60  07/20/18 110/70   Guideline developer:  UpToDate (see UpToDate for funding source) Date Released: June 2014  Health Maintenance Due  Topic Date Due  . COLONOSCOPY  11/12/2014  . OPHTHALMOLOGY EXAM  08/05/2017    There are no preventive care reminders to display for this patient.  Lab Results  Component Value Date   TSH 1.18 08/04/2016   Lab Results  Component Value Date   WBC 6.8 07/06/2018   HGB 13.6 07/06/2018   HCT 39.2 07/06/2018   MCV 85 07/06/2018   PLT 175 07/06/2018   Lab Results  Component Value Date   NA 136 07/06/2018   K 4.6 07/06/2018   CO2 20 07/06/2018   GLUCOSE 238 (H) 07/06/2018   BUN 28 (H) 07/06/2018   CREATININE 0.96 07/06/2018   BILITOT 0.5 06/14/2018   ALKPHOS 62 06/14/2018   AST 14 06/14/2018   ALT 13 06/14/2018   PROT 7.7 06/14/2018   ALBUMIN 4.6 06/14/2018   CALCIUM 10.3 (H) 07/06/2018   GFR 77.18 06/14/2018   Lab Results  Component Value  Date   CHOL 285 (H) 06/14/2018   Lab Results  Component Value Date   HDL 40.90 06/14/2018   Lab Results  Component Value Date   LDLCALC 119 (H) 05/04/2007   Lab Results  Component Value Date   TRIG 266.0 (H) 06/14/2018   Lab Results  Component Value Date   CHOLHDL 7 06/14/2018   Lab Results  Component Value Date   HGBA1C 10.6 (A) 07/03/2018      Assessment & Plan:   Problem List Items Addressed This Visit      Cardiovascular and Mediastinum   Essential hypertension   Relevant Orders   CBC   Comprehensive metabolic panel   Urinalysis, Routine w reflex microscopic   Microalbumin / creatinine urine ratio   Coronary artery disease involving native heart   Relevant Orders   CBC   Comprehensive metabolic panel   LDL cholesterol, direct   Lipid panel     Digestive   TUBULOVILLOUS ADENOMA, COLON   Relevant Orders   Ambulatory referral to Gastroenterology     Other   HYPERCHOLESTEROLEMIA   Relevant Orders   Comprehensive metabolic panel   LDL cholesterol, direct   Lipid panel   Knee pain, left - Primary   Relevant Medications   diclofenac sodium (VOLTAREN) 1 % GEL   Other Relevant Orders   DG Knee Complete 4 Views Left   Abnormal PSA   Relevant Orders   PSA   Urinalysis, Routine w reflex microscopic   Ambulatory referral to Urology   Elevated LDL cholesterol level    Other Visit Diagnoses    Screen for colon cancer       Relevant Orders   Ambulatory referral to Gastroenterology      Meds ordered this encounter  Medications  . diclofenac sodium (VOLTAREN) 1 % GEL    Sig: Apply a small amount to sore knee  3 times daily as needed.    Dispense:  100 g    Refill:  1    Follow-up: Return in about 3 months (around 11/20/2018).

## 2018-09-07 ENCOUNTER — Other Ambulatory Visit: Payer: Self-pay

## 2018-09-07 ENCOUNTER — Ambulatory Visit (INDEPENDENT_AMBULATORY_CARE_PROVIDER_SITE_OTHER): Payer: Medicare Other | Admitting: Endocrinology

## 2018-09-07 ENCOUNTER — Ambulatory Visit (AMBULATORY_SURGERY_CENTER): Payer: Medicare Other

## 2018-09-07 ENCOUNTER — Encounter: Payer: Self-pay | Admitting: Gastroenterology

## 2018-09-07 ENCOUNTER — Encounter: Payer: Self-pay | Admitting: Endocrinology

## 2018-09-07 VITALS — BP 180/74 | HR 86 | Ht 72.0 in | Wt 197.0 lb

## 2018-09-07 VITALS — Ht 71.0 in | Wt 197.7 lb

## 2018-09-07 DIAGNOSIS — E1142 Type 2 diabetes mellitus with diabetic polyneuropathy: Secondary | ICD-10-CM

## 2018-09-07 DIAGNOSIS — Z794 Long term (current) use of insulin: Secondary | ICD-10-CM

## 2018-09-07 DIAGNOSIS — Z86010 Personal history of colonic polyps: Secondary | ICD-10-CM

## 2018-09-07 LAB — POCT GLYCOSYLATED HEMOGLOBIN (HGB A1C): Hemoglobin A1C: 9.1 % — AB (ref 4.0–5.6)

## 2018-09-07 MED ORDER — INSULIN NPH (HUMAN) (ISOPHANE) 100 UNIT/ML ~~LOC~~ SUSP: 110.0000 [IU] | SUBCUTANEOUS | 11 refills | Status: DC

## 2018-09-07 MED ORDER — NA SULFATE-K SULFATE-MG SULF 17.5-3.13-1.6 GM/177ML PO SOLN: 1.0000 | Freq: Once | ORAL | 0 refills | Status: DC

## 2018-09-07 NOTE — Patient Instructions (Addendum)
check your blood sugar twice a day.  vary the time of day when you check, between before the 3 meals, and at bedtime.  also check if you have symptoms of your blood sugar being too high or too low.  please keep a record of the readings and bring it to your next appointment here (or you can bring the meter itself).  You can write it on any piece of paper.  please call us sooner if your blood sugar goes below 70, or if you have a lot of readings over 200.   Please increase the insulin to 110 units each morning.  It is best to take it al in the morning, as it is a time-release insulin.   Please come back for a follow-up appointment in 2 months.

## 2018-09-07 NOTE — Progress Notes (Signed)
No egg or soy allergy known to patient  No issues with past sedation with any surgeries  or procedures, no intubation problems  No diet pills per patient No home 02 use per patient  No blood thinners per patient  Pt denies issues with constipation  No A fib or A flutter  EMMI video sent to pt's e mail , pt declined    

## 2018-09-07 NOTE — Progress Notes (Signed)
Subjective:    Patient ID: Scott Donaldson, male    DOB: 09/16/1950, 68 y.o.   MRN: [id number]  HPI Pt returns for f/u of diabetes mellitus: DM type: insulin-requiring type 2.   [id number] Complications: polyneuropathy and foot ulcer.  Therapy: insulin since 2010.   DKA: never.  Severe hypoglycemia: never.  Pancreatitis: never.   Other: he needs a simple insulin regimen, due to ongoing noncompliance; ins declined tresiba; he takes human insulin, due to cost; he was ref DM educ, to review inject technique, but he did not go.   Interval history: no cbg record, but states cbg's vary from 160's-350.  He says he seldom misses the insulin, but he takes 40 units qam and 60 qpm,,according to cbg.  He seldom has hypoglycemia, and these episodes are mild.  It happens fasting.  cbg is in general higher as the day goes on  Past Medical History:  Diagnosis Date  . Abdominal pain 08/04/2016  . Abnormal PSA 05/19/2015  . Arthralgia 11/12/2010  . Benign prostatic hyperplasia with urinary hesitancy 06/14/2018  . Cataract   . CELLULITIS, FOOT, RIGHT 07/02/2008   Qualifier: Diagnosis of  By: Marca Ancona RMA, Lucy    . Cough 08/04/2016  . Diabetes ([place]) 03/04/2007   Qualifier: Diagnosis of  By: Loanne Drilling MD, Jacelyn Pi   . DIABETES MELLITUS, TYPE II 03/04/2007  . Edema 07/02/2008   Qualifier: Diagnosis of  By: Marca Ancona RMA, Lucy    . Elevated LDL cholesterol level 06/14/2018  . Foot ulcer, right ([place]) 04/15/2014  . GERD (gastroesophageal reflux disease)   . HYPERCHOLESTEROLEMIA 07/29/2008  . HYPERTENSION 03/04/2007  . Knee pain, left 05/16/2015  . Loss of vision 04/15/2014  . Nonintractable episodic headache 12/01/2016  . Psoriasis   . SHOULDER PAIN, BILATERAL 01/12/2010   Qualifier: Diagnosis of  By: Loanne Drilling MD, Jacelyn Pi   . Swelling of left foot 08/05/2017  . 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 COLONOSCOPY      . LEFT HEART CATH AND CORONARY ANGIOGRAPHY N/A 07/12/2018   Procedure: LEFT HEART CATH AND CORONARY ANGIOGRAPHY;  Surgeon: Wellington Hampshire, MD;  Location: [facility];  Service: Cardiovascular;  Laterality: N/A;  . stress cardiolite  05/30/2003    Social History   Socioeconomic History  . Marital status: Single    Spouse name: Not on file  . Number of children: 2  . Years of education: 10  . Highest education level: Not on file  Occupational History  . Occupation: roofer  Social Needs  . Financial resource strain: Not on file  . Food insecurity:    Worry: Not on file    Inability: Not on file  . Transportation needs:    Medical: Not on file    Non-medical: Not on file  Tobacco Use  . Smoking status: Former Smoker    Last attempt to quit: 07/26/1988    Years since quitting: 30.1  . Smokeless tobacco: Never Used  Substance and Sexual Activity  . Alcohol use: No  . Drug use: No  . Sexual activity: Not on file  Lifestyle  . Physical activity:    Days per week: Not on file    Minutes per session: Not on file  . Stress: Not on file  Relationships  . Social connections:    Talks on phone: Not on file  Gets together: Not on file    Attends religious service: Not on file    Active member of club or organization: Not on file    Attends meetings of clubs or organizations: Not on file    Relationship status: Not on file  . Intimate partner violence:    Fear of current or ex partner: Not on file    Emotionally abused: Not on file    Physically abused: Not on file    Forced sexual activity: Not on file  Other Topics Concern  . Not on file  Social History Narrative   Lives with daughter in a one story home.  Has 2 children.  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  Education: 10th grade.     Current Outpatient Medications on File Prior to Visit  Medication Sig Dispense Refill  . aspirin EC 81 MG tablet Take 1 tablet (81 mg total) by mouth daily.    [name] atorvastatin  (LIPITOR) 20 MG tablet Take 1 tablet (20 mg total) by mouth every evening. 90 tablet 1  . diclofenac sodium (VOLTAREN) 1 % GEL Apply a small amount to sore knee 3 times daily as needed. (Patient not taking: Reported on 09/07/2018) 100 g 1  . glucose blood (ACCU-CHEK GUIDE) test strip Used to check blood sugars twice daily. 100 each 12  . metoprolol succinate (TOPROL-XL) 25 MG 24 hr tablet Take 1 tablet (25 mg total) by mouth daily. 90 tablet 1  . olmesartan (BENICAR) 20 MG tablet Take 1 tablet (20 mg total) by mouth daily. 90 tablet 1   No current facility-administered medications on file prior to visit.     No Known Allergies  Family History  Problem Relation Age of Onset  . Cancer Mother        Breast Cancer  . Cancer Father        uncertain type  . Diabetes Father   . Cancer Brother 23       Colon Cancer  . Colon cancer Neg Hx   . Rectal cancer Neg Hx   . Stomach cancer Neg Hx     BP (!) 180/74 (BP Location: Left Arm, Patient Position: Sitting, Cuff Size: Normal) Comment: takes antihypertensives in the evening  Pulse 86   Ht 6' (1.829 m)   Wt 197 lb (89.4 kg)   SpO2 96%   BMI 26.72 kg/m    Review of Systems He denies LOC    Objective:   Physical Exam VITAL SIGNS:  See vs page GENERAL: no distress Pulses: dorsalis pedis intact bilat.   MSK: no deformity of the feet CV: no leg edema Skin:  no ulcer on the feet.  normal color and temp on the feet. Neuro: sensation is intact to touch on the feet, but decreased from normal Ext: There is bilateral onychomycosis of the toenails.   Lab Results  Component Value Date   HGBA1C 9.1 (A) 09/07/2018        Assessment & Plan:  Insulin-requiring type 2 DM, with PN: he needs increased rx.  Hypoglycemia: This limits aggressiveness of glycemic control, so we'll have to increase the insulin slowly. Noncompliance with cbg recording and insulin.  He is not a candidate for multiple daily injections.    Patient Instructions    check your blood sugar twice a day.  vary the time of day when you check, between before the 3 meals, and at bedtime.  also check if you have symptoms of your blood sugar being too  high or too low.  please keep a record of the readings and bring it to your next appointment here (or you can bring the meter itself).  You can write it on any piece of paper.  please call us sooner if your blood sugar goes below 70, or if you have a lot of readings over 200.   Please increase the insulin to 110 units each morning.  It is best to take it al in the morning, as it is a time-release insulin.   Please come back for a follow-up appointment in 2 months.

## 2018-09-11 ENCOUNTER — Other Ambulatory Visit: Payer: Medicare Other | Admitting: *Deleted

## 2018-09-11 DIAGNOSIS — E7800 Pure hypercholesterolemia, unspecified: Secondary | ICD-10-CM | POA: Diagnosis not present

## 2018-09-11 DIAGNOSIS — I2510 Atherosclerotic heart disease of native coronary artery without angina pectoris: Secondary | ICD-10-CM | POA: Diagnosis not present

## 2018-09-11 LAB — HEPATIC FUNCTION PANEL
ALT: 13 IU/L (ref 0–44)
AST: 15 IU/L (ref 0–40)
Albumin: 4.4 g/dL (ref 3.8–4.8)
Alkaline Phosphatase: 79 IU/L (ref 39–117)
Bilirubin Total: 0.3 mg/dL (ref 0.0–1.2)
Bilirubin, Direct: 0.07 mg/dL (ref 0.00–0.40)
TOTAL PROTEIN: 7.3 g/dL (ref 6.0–8.5)

## 2018-09-22 ENCOUNTER — Ambulatory Visit (AMBULATORY_SURGERY_CENTER): Payer: Medicare Other | Admitting: Gastroenterology

## 2018-09-22 ENCOUNTER — Encounter: Payer: Self-pay | Admitting: Gastroenterology

## 2018-09-22 VITALS — BP 121/52 | HR 57 | Temp 99.6°F | Resp 16 | Ht 72.0 in | Wt 197.0 lb

## 2018-09-22 DIAGNOSIS — K6289 Other specified diseases of anus and rectum: Secondary | ICD-10-CM | POA: Diagnosis not present

## 2018-09-22 DIAGNOSIS — Z86010 Personal history of colonic polyps: Secondary | ICD-10-CM | POA: Diagnosis not present

## 2018-09-22 DIAGNOSIS — K626 Ulcer of anus and rectum: Secondary | ICD-10-CM

## 2018-09-22 DIAGNOSIS — D123 Benign neoplasm of transverse colon: Secondary | ICD-10-CM

## 2018-09-22 DIAGNOSIS — D122 Benign neoplasm of ascending colon: Secondary | ICD-10-CM

## 2018-09-22 MED ORDER — SODIUM CHLORIDE 0.9 % IV SOLN: 500.0000 mL | Freq: Once | INTRAVENOUS | Status: DC

## 2018-09-22 NOTE — Progress Notes (Signed)
Pt's states no medical or surgical changes since previsit or office visit. 

## 2018-09-22 NOTE — Op Note (Signed)
[facility] Patient Name: Scott Donaldson Procedure Date: 09/22/2018 7:57 AM MRN: [id number] Endoscopist: Mauri Pole , MD Age: 68 Referring MD:  Date of Birth: 17-Oct-1950 Gender: Male Account #: [id number] Procedure:                Colonoscopy Indications:              Screening patient at increased risk: Family history                            of colorectal cancer in multiple 1st-degree                            relatives, High risk colon cancer surveillance:                            Personal history of colonic polyps, Last                            colonoscopy: 2002 Medicines:                Monitored Anesthesia Care Procedure:                Pre-Anesthesia Assessment:                           - Prior to the procedure, a History and Physical                            was performed, and patient medications and                            allergies were reviewed. The patient's tolerance of                            previous anesthesia was also reviewed. The risks                            and benefits of the procedure and the sedation                            options and risks were discussed with the patient.                            All questions were answered, and informed consent                            was obtained. Prior Anticoagulants: The patient has                            taken no previous anticoagulant or antiplatelet                            agents. ASA Grade Assessment: II - A patient with  mild systemic disease. After reviewing the risks                            and benefits, the patient was deemed in                            satisfactory condition to undergo the procedure.                           After obtaining informed consent, the colonoscope                            was passed under direct vision. Throughout the                            procedure, the patient's blood pressure, pulse, and                           oxygen saturations were monitored continuously. The                            Colonoscope was introduced through the anus and                            advanced to the the cecum, identified by                            appendiceal orifice and ileocecal valve. The                            colonoscopy was performed without difficulty. The                            patient tolerated the procedure well. The quality                            of the bowel preparation was excellent. The                            ileocecal valve, appendiceal orifice, and rectum                            were photographed. Scope In: 8:03:19 AM Scope Out: 8:18:49 AM Scope Withdrawal Time: 0 hours 13 minutes 24 seconds  Total Procedure Duration: 0 hours 15 minutes 30 seconds  Findings:                 The perianal and digital rectal examinations were                            normal.                           Four sessile polyps were found in the transverse  colon and ascending colon. The polyps were 1 to 2                            mm in size. These polyps were removed with a cold                            biopsy forceps. Resection and retrieval were                            complete.                           A 4 mm polyp was found in the transverse colon. The                            polyp was sessile. The polyp was removed with a                            cold snare. Resection and retrieval were complete.                           A few small-mouthed diverticula were found in the                            sigmoid colon.                           A continuous area of ulcerated mucosa (superficial                            linear ulcers) was present in the rectum. Biopsies                            were taken with a cold forceps for histology.                           Non-bleeding internal hemorrhoids were found during                             retroflexion. The hemorrhoids were small. Complications:            No immediate complications. Estimated Blood Loss:     Estimated blood loss was minimal. Impression:               - Four 1 to 2 mm polyps in the transverse colon and                            in the ascending colon, removed with a cold biopsy                            forceps. Resected and retrieved.                           - One 4 mm polyp in the  transverse colon, removed                            with a cold snare. Resected and retrieved.                           - Diverticulosis in the sigmoid colon.                           - Mucosal ulceration. Biopsied.                           - Non-bleeding internal hemorrhoids. Recommendation:           - Patient has a contact number available for                            emergencies. The signs and symptoms of potential                            delayed complications were discussed with the                            patient. Return to normal activities tomorrow.                            Written discharge instructions were provided to the                            patient.                           - Resume previous diet.                           - Continue present medications.                           - Await pathology results.                           - Repeat colonoscopy in 3 - 5 years for                            surveillance based on pathology results. Mauri Pole, MD 09/22/2018 8:25:11 AM This report has been signed electronically.

## 2018-09-22 NOTE — Progress Notes (Signed)
PT taken to PACU. Monitors in place. VSS. Report given to RN. 

## 2018-09-25 ENCOUNTER — Telehealth: Payer: Self-pay

## 2018-09-25 NOTE — Telephone Encounter (Signed)
Could not leave message, mailbox full

## 2018-09-25 NOTE — Telephone Encounter (Signed)
  Follow up Call-  Call back number 09/22/2018  Post procedure Call Back phone  # [phone]  Permission to leave phone message Yes  Some recent data might be hidden     Can not leave a message on voicemail because "the mailbox is full and can not accept messages"

## 2018-10-02 ENCOUNTER — Other Ambulatory Visit: Payer: Self-pay | Admitting: Endocrinology

## 2018-10-05 ENCOUNTER — Encounter: Payer: Self-pay | Admitting: Gastroenterology

## 2018-11-10 ENCOUNTER — Ambulatory Visit: Payer: Medicare Other | Admitting: Endocrinology

## 2018-11-20 ENCOUNTER — Encounter: Payer: Self-pay | Admitting: Family Medicine

## 2018-11-20 ENCOUNTER — Ambulatory Visit: Payer: Medicare Other | Admitting: Family Medicine

## 2018-11-20 DIAGNOSIS — Z91199 Patient's noncompliance with other medical treatment and regimen due to unspecified reason: Secondary | ICD-10-CM

## 2018-11-20 DIAGNOSIS — Z5329 Procedure and treatment not carried out because of patient's decision for other reasons: Secondary | ICD-10-CM

## 2018-11-20 NOTE — Progress Notes (Deleted)
Virtual Visit via Video Note  I connected with Scott Donaldson on 11/20/18 at  8:00 AM EDT by a video enabled telemedicine application and verified that I am speaking with the correct person using two identifiers.   I discussed the limitations of evaluation and management by telemedicine and the availability of in person appointments. The patient expressed understanding and agreed to proceed.  History of Present Illness:    Observations/Objective:   Assessment and Plan:   Follow Up Instructions:    I discussed the assessment and treatment plan with the patient. The patient was provided an opportunity to ask questions and all were answered. The patient agreed with the plan and demonstrated an understanding of the instructions.   The patient was advised to call back or seek an in-person evaluation if the symptoms worsen or if the condition fails to improve as anticipated.  I provided *** minutes of non-face-to-face time during this encounter.

## 2018-11-21 DIAGNOSIS — Z5329 Procedure and treatment not carried out because of patient's decision for other reasons: Secondary | ICD-10-CM | POA: Insufficient documentation

## 2018-11-21 DIAGNOSIS — Z91199 Patient's noncompliance with other medical treatment and regimen due to unspecified reason: Secondary | ICD-10-CM | POA: Insufficient documentation

## 2018-12-01 ENCOUNTER — Encounter: Payer: Self-pay | Admitting: Endocrinology

## 2018-12-01 ENCOUNTER — Ambulatory Visit: Payer: Medicare Other | Admitting: Endocrinology

## 2018-12-01 ENCOUNTER — Other Ambulatory Visit: Payer: Self-pay

## 2018-12-01 VITALS — BP 150/70 | HR 54 | Ht 72.0 in | Wt 202.2 lb

## 2018-12-01 DIAGNOSIS — E1142 Type 2 diabetes mellitus with diabetic polyneuropathy: Secondary | ICD-10-CM | POA: Diagnosis not present

## 2018-12-01 DIAGNOSIS — Z794 Long term (current) use of insulin: Secondary | ICD-10-CM

## 2018-12-01 LAB — POCT GLYCOSYLATED HEMOGLOBIN (HGB A1C): Hemoglobin A1C: 9.2 % — AB (ref 4.0–5.6)

## 2018-12-01 MED ORDER — INSULIN NPH (HUMAN) (ISOPHANE) 100 UNIT/ML ~~LOC~~ SUSP: 110.0000 [IU] | Freq: Every day | SUBCUTANEOUS | 11 refills | Status: DC

## 2018-12-01 NOTE — Progress Notes (Signed)
Subjective:    Patient ID: Scott Donaldson, male    DOB: January 21, 1951, 68 y.o.   MRN: [id number]  HPI Pt returns for f/u of diabetes mellitus: DM type: insulin-requiring type 2.   [id number] Complications: polyneuropathy and foot ulcer.  Therapy: insulin since 2010.   DKA: never.  Severe hypoglycemia: never.  Pancreatitis: never.   Other: he needs a simple insulin regimen, due to ongoing noncompliance; ins declined tresiba; he takes human insulin, due to cost; he was ref DM educ, to review inject technique, but he did not go.   Interval history: He takes just 100 units qam.  He sometimes takes 30-35 units QPM, if cbg is high then.  no cbg record, but states cbg's vary from 48-190.  pt states he feels well in general. Past Medical History:  Diagnosis Date  . Abdominal pain 08/04/2016  . Abnormal PSA 05/19/2015  . Arthralgia 11/12/2010  . Benign prostatic hyperplasia with urinary hesitancy 06/14/2018  . Cataract   . CELLULITIS, FOOT, RIGHT 07/02/2008   Qualifier: Diagnosis of  By: Marca Ancona RMA, Lucy    . Cough 08/04/2016  . Diabetes ([place]) 03/04/2007   Qualifier: Diagnosis of  By: Loanne Drilling MD, Jacelyn Pi   . DIABETES MELLITUS, TYPE II 03/04/2007  . Edema 07/02/2008   Qualifier: Diagnosis of  By: Marca Ancona RMA, Lucy    . Elevated LDL cholesterol level 06/14/2018  . Foot ulcer, right ([place]) 04/15/2014  . GERD (gastroesophageal reflux disease)   . HYPERCHOLESTEROLEMIA 07/29/2008  . HYPERTENSION 03/04/2007  . Knee pain, left 05/16/2015  . Loss of vision 04/15/2014  . Nonintractable episodic headache 12/01/2016  . Psoriasis   . SHOULDER PAIN, BILATERAL 01/12/2010   Qualifier: Diagnosis of  By: Loanne Drilling MD, Jacelyn Pi   . Swelling of left foot 08/05/2017  . 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 COLONOSCOPY    . LEFT HEART CATH AND CORONARY ANGIOGRAPHY N/A 07/12/2018   Procedure: LEFT HEART CATH AND  CORONARY ANGIOGRAPHY;  Surgeon: Wellington Hampshire, MD;  Location: [facility];  Service: Cardiovascular;  Laterality: N/A;  . stress cardiolite  05/30/2003    Social History   Socioeconomic History  . Marital status: Single    Spouse name: Not on file  . Number of children: 2  . Years of education: 10  . Highest education level: Not on file  Occupational History  . Occupation: roofer  Social Needs  . Financial resource strain: Not on file  . Food insecurity:    Worry: Not on file    Inability: Not on file  . Transportation needs:    Medical: Not on file    Non-medical: Not on file  Tobacco Use  . Smoking status: Former Smoker    Last attempt to quit: 07/26/1988    Years since quitting: 30.3  . Smokeless tobacco: Never Used  Substance and Sexual Activity  . Alcohol use: No  . Drug use: No  . Sexual activity: Not on file  Lifestyle  . Physical activity:    Days per week: Not on file    Minutes per session: Not on file  . Stress: Not on file  Relationships  . Social connections:    Talks on phone: Not on file    Gets together: Not on file    Attends religious service: Not on file    Active member  of club or organization: Not on file    Attends meetings of clubs or organizations: Not on file    Relationship status: Not on file  . Intimate partner violence:    Fear of current or ex partner: Not on file    Emotionally abused: Not on file    Physically abused: Not on file    Forced sexual activity: Not on file  Other Topics Concern  . Not on file  Social History Narrative   Lives with daughter in a one story home.  Has 2 children.  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  Education: 10th grade.     Current Outpatient Medications on File Prior to Visit  Medication Sig Dispense Refill  . aspirin EC 81 MG tablet Take 1 tablet (81 mg total) by mouth daily.    [name] atorvastatin (LIPITOR) 20 MG tablet Take 1 tablet (20 mg total) by mouth every evening. 90 tablet 1  . glucose  blood test strip USE TO CHECK BLOOD SUGAR TWICE DAILY 100 each 12  . metoprolol succinate (TOPROL-XL) 25 MG 24 hr tablet Take 1 tablet (25 mg total) by mouth daily. 90 tablet 1  . olmesartan (BENICAR) 20 MG tablet Take 1 tablet (20 mg total) by mouth daily. 90 tablet 1   No current facility-administered medications on file prior to visit.     No Known Allergies  Family History  Problem Relation Age of Onset  . Cancer Mother        Breast Cancer  . Cancer Father        uncertain type  . Diabetes Father   . Cancer Brother 39       Colon Cancer  . Colon cancer Neg Hx   . Rectal cancer Neg Hx   . Stomach cancer Neg Hx     BP (!) 150/70 (BP Location: Left Arm, Patient Position: Sitting, Cuff Size: Normal)   Pulse (!) 54   Ht 6' (1.829 m)   Wt 202 lb 3.2 oz (91.7 kg)   SpO2 96%   BMI 27.42 kg/m   Review of Systems He denies hypoglycemia    Objective:   Physical Exam VITAL SIGNS:  See vs page GENERAL: no distress Pulses: dorsalis pedis intact bilat.   MSK: no deformity of the feet CV: no leg edema Skin:  no ulcer on the feet.  normal color and temp on the feet. Neuro: sensation is intact to touch on the feet Ext: There is bilateral onychomycosis of the toenails.    A1c=9.2%     Assessment & Plan:  Insulin-requiring type 2 DM, with PN: worse. HTN: is noted today   Hypoglycemia: This limits aggressiveness of glycemic control.   Patient Instructions  Your blood pressure is high today.  Please see your primary care provider soon, to have it rechecked check your blood sugar twice a day.  vary the time of day when you check, between before the 3 meals, and at bedtime.  also check if you have symptoms of your blood sugar being too high or too low.  please keep a record of the readings and bring it to your next appointment here (or you can bring the meter itself).  You can write it on any piece of paper.  please call us sooner if your blood sugar goes below 70, or if you have  a lot of readings over 200.   Please increase the insulin to 110 units each morning, and none in the evening.  It is best to take it all in the morning, as it is a time-release insulin.   Please come back for a follow-up appointment in 2 months.

## 2018-12-01 NOTE — Patient Instructions (Addendum)
Your blood pressure is high today.  Please see your primary care provider soon, to have it rechecked check your blood sugar twice a day.  vary the time of day when you check, between before the 3 meals, and at bedtime.  also check if you have symptoms of your blood sugar being too high or too low.  please keep a record of the readings and bring it to your next appointment here (or you can bring the meter itself).  You can write it on any piece of paper.  please call us sooner if your blood sugar goes below 70, or if you have a lot of readings over 200.   Please increase the insulin to 110 units each morning, and none in the evening.  It is best to take it all in the morning, as it is a time-release insulin.   Please come back for a follow-up appointment in 2 months.

## 2019-01-12 ENCOUNTER — Other Ambulatory Visit: Payer: Self-pay | Admitting: Family Medicine

## 2019-01-12 DIAGNOSIS — I2510 Atherosclerotic heart disease of native coronary artery without angina pectoris: Secondary | ICD-10-CM

## 2019-01-12 DIAGNOSIS — E1142 Type 2 diabetes mellitus with diabetic polyneuropathy: Secondary | ICD-10-CM

## 2019-01-12 DIAGNOSIS — I10 Essential (primary) hypertension: Secondary | ICD-10-CM

## 2019-01-31 ENCOUNTER — Ambulatory Visit: Payer: Medicare Other | Admitting: Endocrinology

## 2019-01-31 DIAGNOSIS — Z0289 Encounter for other administrative examinations: Secondary | ICD-10-CM

## 2019-02-12 ENCOUNTER — Encounter: Payer: Self-pay | Admitting: Family Medicine

## 2019-02-12 ENCOUNTER — Other Ambulatory Visit: Payer: Self-pay

## 2019-02-12 ENCOUNTER — Ambulatory Visit (INDEPENDENT_AMBULATORY_CARE_PROVIDER_SITE_OTHER): Payer: Medicare Other | Admitting: Family Medicine

## 2019-02-12 ENCOUNTER — Ambulatory Visit (INDEPENDENT_AMBULATORY_CARE_PROVIDER_SITE_OTHER): Payer: Medicare Other

## 2019-02-12 VITALS — BP 126/60 | HR 81 | Ht 72.0 in

## 2019-02-12 DIAGNOSIS — R972 Elevated prostate specific antigen [PSA]: Secondary | ICD-10-CM | POA: Diagnosis not present

## 2019-02-12 DIAGNOSIS — R079 Chest pain, unspecified: Secondary | ICD-10-CM

## 2019-02-12 DIAGNOSIS — I119 Hypertensive heart disease without heart failure: Secondary | ICD-10-CM

## 2019-02-12 DIAGNOSIS — E7800 Pure hypercholesterolemia, unspecified: Secondary | ICD-10-CM | POA: Diagnosis not present

## 2019-02-12 DIAGNOSIS — R0609 Other forms of dyspnea: Secondary | ICD-10-CM

## 2019-02-12 DIAGNOSIS — K5901 Slow transit constipation: Secondary | ICD-10-CM | POA: Diagnosis not present

## 2019-02-12 DIAGNOSIS — R1030 Lower abdominal pain, unspecified: Secondary | ICD-10-CM

## 2019-02-12 DIAGNOSIS — I2510 Atherosclerotic heart disease of native coronary artery without angina pectoris: Secondary | ICD-10-CM | POA: Diagnosis not present

## 2019-02-12 DIAGNOSIS — M5412 Radiculopathy, cervical region: Secondary | ICD-10-CM

## 2019-02-12 DIAGNOSIS — I10 Essential (primary) hypertension: Secondary | ICD-10-CM

## 2019-02-12 DIAGNOSIS — K5900 Constipation, unspecified: Secondary | ICD-10-CM | POA: Insufficient documentation

## 2019-02-12 DIAGNOSIS — R0789 Other chest pain: Secondary | ICD-10-CM | POA: Insufficient documentation

## 2019-02-12 DIAGNOSIS — M542 Cervicalgia: Secondary | ICD-10-CM | POA: Diagnosis not present

## 2019-02-12 MED ORDER — POLYETHYLENE GLYCOL 3350 17 GM/SCOOP PO POWD: 17.0000 g | Freq: Once | ORAL | 1 refills | Status: AC

## 2019-02-12 MED ORDER — DOCUSATE SODIUM 100 MG PO CAPS: 100.0000 mg | ORAL_CAPSULE | Freq: Two times a day (BID) | ORAL | 0 refills | Status: DC

## 2019-02-12 NOTE — Progress Notes (Addendum)
Established Patient Office Visit  Subjective:  Patient ID: Scott Donaldson, male    DOB: [birth year]  Age: 68 y.o. MRN: [id number]  CC:  Chief Complaint  Patient presents with  . Abdominal Pain  . Chest Pain    HPI Scott Donaldson presents for treatment and evaluation of multiple medical issues.  He has been dealing with some dyspnea on exertion and soft left chest pain.  Past medical history of hypertension elevated cholesterol and coronary artery disease.  History of balloon angioplasty.  He is also been having tingling in a stocking glove distribution involving his left hand.  He is right-hand dominant there is not been any injury.  Ongoing history of intermittent neck stiffness and discomfort.  He has been having lower abdominal pain.  History of chronic constipation.  He stools perhaps 1 time a week.  Status post recent colonoscopy with polypectomy.  Denies fevers chills nausea or vomiting blood in his stool or melena.  History of elevated PSA was referred to urology.  He elected not to go for the referral.  Past Medical History:  Diagnosis Date  . Abdominal pain 08/04/2016  . Abnormal PSA 05/19/2015  . Arthralgia 11/12/2010  . Benign prostatic hyperplasia with urinary hesitancy 06/14/2018  . Cataract   . CELLULITIS, FOOT, RIGHT 07/02/2008   Qualifier: Diagnosis of  By: Marca Ancona RMA, Lucy    . Cough 08/04/2016  . Diabetes ([place]) 03/04/2007   Qualifier: Diagnosis of  By: Loanne Drilling MD, Jacelyn Pi   . DIABETES MELLITUS, TYPE II 03/04/2007  . Edema 07/02/2008   Qualifier: Diagnosis of  By: Marca Ancona RMA, Lucy    . Elevated LDL cholesterol level 06/14/2018  . Foot ulcer, right ([place]) 04/15/2014  . GERD (gastroesophageal reflux disease)   . HYPERCHOLESTEROLEMIA 07/29/2008  . HYPERTENSION 03/04/2007  . Knee pain, left 05/16/2015  . Loss of vision 04/15/2014  . Nonintractable episodic headache 12/01/2016  . Psoriasis   . SHOULDER PAIN, BILATERAL 01/12/2010   Qualifier: Diagnosis of  By: Loanne Drilling MD, Jacelyn Pi    . Swelling of left foot 08/05/2017  . 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 COLONOSCOPY    . LEFT HEART CATH AND CORONARY ANGIOGRAPHY N/A 07/12/2018   Procedure: LEFT HEART CATH AND CORONARY ANGIOGRAPHY;  Surgeon: Wellington Hampshire, MD;  Location: [facility];  Service: Cardiovascular;  Laterality: N/A;  . stress cardiolite  05/30/2003    Family History  Problem Relation Age of Onset  . Cancer Mother        Breast Cancer  . Cancer Father        uncertain type  . Diabetes Father   . Cancer Brother 43       Colon Cancer  . Colon cancer Neg Hx   . Rectal cancer Neg Hx   . Stomach cancer Neg Hx     Social History   Socioeconomic History  . Marital status: Single    Spouse name: Not on file  . Number of children: 2  . Years of education: 10  . Highest education level: Not on file  Occupational History  . Occupation: roofer  Social Needs  . Financial resource strain: Not on file  . Food insecurity    Worry: Not on file    Inability: Not on file  . Transportation needs    Medical: Not on file  Non-medical: Not on file  Tobacco Use  . Smoking status: Former Smoker    Quit date: 07/26/1988    Years since quitting: 30.5  . Smokeless tobacco: Never Used  Substance and Sexual Activity  . Alcohol use: No  . Drug use: No  . Sexual activity: Not on file  Lifestyle  . Physical activity    Days per week: Not on file    Minutes per session: Not on file  . Stress: Not on file  Relationships  . Social Herbalist on phone: Not on file    Gets together: Not on file    Attends religious service: Not on file    Active member of club or organization: Not on file    Attends meetings of clubs or organizations: Not on file    Relationship status: Not on file  . Intimate partner violence    Fear of current or ex partner: Not on file    Emotionally  abused: Not on file    Physically abused: Not on file    Forced sexual activity: Not on file  Other Topics Concern  . Not on file  Social History Narrative   Lives with daughter in a one story home.  Has 2 children.  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  Education: 10th grade.     Outpatient Medications Prior to Visit  Medication Sig Dispense Refill  . aspirin EC 81 MG tablet Take 1 tablet (81 mg total) by mouth daily.    [name] atorvastatin (LIPITOR) 20 MG tablet Take 1 tablet (20 mg total) by mouth every evening. 90 tablet 1  . glucose blood test strip USE TO CHECK BLOOD SUGAR TWICE DAILY 100 each 12  . insulin NPH Human (NOVOLIN N RELION) 100 UNIT/ML injection Inject 1.1 mLs (110 Units total) into the skin daily before breakfast. And syringes 1/day 40 mL 11  . metoprolol succinate (TOPROL-XL) 25 MG 24 hr tablet TAKE 1 TABLET(25 MG) BY MOUTH DAILY 90 tablet 1  . olmesartan (BENICAR) 20 MG tablet TAKE 1 TABLET(20 MG) BY MOUTH DAILY 90 tablet 1   No facility-administered medications prior to visit.     No Known Allergies  ROS Review of Systems  Constitutional: Negative.   HENT: Negative.   Eyes: Negative for photophobia and visual disturbance.  Respiratory: Positive for shortness of breath. Negative for chest tightness.   Cardiovascular: Positive for chest pain. Negative for palpitations.  Gastrointestinal: Positive for constipation. Negative for blood in stool, diarrhea, nausea and vomiting.  Endocrine: Negative for polyphagia and polyuria.  Genitourinary: Negative for hematuria and urgency.  Musculoskeletal: Positive for neck pain and neck stiffness.  Skin: Negative.   Allergic/Immunologic: Negative for immunocompromised state.  Neurological: Positive for numbness. Negative for speech difficulty, weakness and light-headedness.  Hematological: Does not bruise/bleed easily.  Psychiatric/Behavioral: Negative.       Objective:    Physical Exam  Constitutional: He is oriented to  person, place, and time. He appears well-developed and well-nourished. No distress.  HENT:  Head: Normocephalic and atraumatic.  Right Ear: External ear normal.  Left Ear: External ear normal.  Mouth/Throat: Oropharynx is clear and moist.  Eyes: Pupils are equal, round, and reactive to light. Conjunctivae are normal. Right eye exhibits no discharge. Left eye exhibits no discharge. No scleral icterus.  Neck: No JVD present. No tracheal deviation present. No thyromegaly present.  Cardiovascular: Normal rate and regular rhythm.  Murmur heard. Pulmonary/Chest: Breath sounds normal. No stridor. No respiratory  distress. He has no wheezes. He has no rales.  Abdominal: Bowel sounds are normal. He exhibits no distension. There is no abdominal tenderness. There is no rebound and no guarding.  Musculoskeletal:     Cervical back: He exhibits normal range of motion, no tenderness and no bony tenderness.       Back:  Lymphadenopathy:    He has no cervical adenopathy.  Neurological: He is alert and oriented to person, place, and time. He has normal strength.  Reflex Scores:      Tricep reflexes are 1+ on the right side and 1+ on the left side.      Bicep reflexes are 1+ on the right side and 1+ on the left side.      Brachioradialis reflexes are 2+ on the right side and 1+ on the left side. Skin: Skin is warm and dry. He is not diaphoretic.  Psychiatric: He has a normal mood and affect. His behavior is normal.    BP 126/60   Pulse 81   Ht 6' (1.829 m)   SpO2 96%   BMI 27.42 kg/m  Wt Readings from Last 3 Encounters:  02/14/19 202 lb (91.6 kg)  12/01/18 202 lb 3.2 oz (91.7 kg)  09/22/18 197 lb (89.4 kg)   BP Readings from Last 3 Encounters:  02/14/19 (!) 144/70  02/12/19 126/60  12/01/18 (!) 150/70   Guideline developer:  UpToDate (see UpToDate for funding source) Date Released: June 2014  There are no preventive care reminders to display for this patient.  There are no preventive care  reminders to display for this patient.  Lab Results  Component Value Date   TSH 1.18 08/04/2016   Lab Results  Component Value Date   WBC 6.9 02/12/2019   HGB 12.2 (L) 02/12/2019   HCT 35.5 (L) 02/12/2019   MCV 86.8 02/12/2019   PLT 159.0 02/12/2019   Lab Results  Component Value Date   NA 136 02/12/2019   K 4.7 02/12/2019   CO2 21 02/12/2019   GLUCOSE 422 (H) 02/12/2019   BUN 67 (H) 02/12/2019   CREATININE 1.43 02/12/2019   BILITOT 0.4 02/12/2019   ALKPHOS 69 02/12/2019   AST 16 02/12/2019   ALT 14 02/12/2019   PROT 7.3 02/12/2019   ALBUMIN 4.5 02/12/2019   CALCIUM 8.8 02/12/2019   GFR 49.07 (L) 02/12/2019   Lab Results  Component Value Date   CHOL 165 08/21/2018   Lab Results  Component Value Date   HDL 45.80 08/21/2018   Lab Results  Component Value Date   LDLCALC 104 (H) 08/21/2018   Lab Results  Component Value Date   TRIG 77.0 08/21/2018   Lab Results  Component Value Date   CHOLHDL 4 08/21/2018   Lab Results  Component Value Date   HGBA1C 9.2 (A) 12/01/2018      Assessment & Plan:   Problem List Items Addressed This Visit      Cardiovascular and Mediastinum   Essential hypertension   Coronary artery disease involving native heart   Relevant Orders   EKG 12-Lead   Ambulatory referral to Cardiology     Digestive   Slow transit constipation   Relevant Medications   docusate sodium (COLACE) 100 MG capsule   Other Relevant Orders   DG Cervical Spine Complete (Completed)     Nervous and Auditory   Cervical radiculopathy   Relevant Orders   Ambulatory referral to Sports Medicine     Other   Lower abdominal  pain   Relevant Orders   CBC (Completed)   Comprehensive metabolic panel (Completed)   Elevated LDL cholesterol level   Relevant Orders   LDL cholesterol, direct (Completed)   DOE (dyspnea on exertion)   Relevant Orders   EKG 12-Lead   Ambulatory referral to Cardiology   Chest pain - Primary   Relevant Orders   EKG  12-Lead (Completed)   EKG 12-Lead   Ambulatory referral to Cardiology    Other Visit Diagnoses    Elevated PSA       Relevant Orders   PSA (Completed)   Ambulatory referral to Urology      Meds ordered this encounter  Medications  . docusate sodium (COLACE) 100 MG capsule    Sig: Take 1 capsule (100 mg total) by mouth 2 (two) times daily.    Dispense:  10 capsule    Refill:  0  . polyethylene glycol powder (GLYCOLAX/MIRALAX) 17 GM/SCOOP powder    Sig: Take 17 g by mouth once for 1 dose.    Dispense:  3350 g    Refill:  1    Follow-up: Return in about 1 month (around 03/15/2019).

## 2019-02-13 LAB — CBC
HCT: 35.5 % — ABNORMAL LOW (ref 39.0–52.0)
Hemoglobin: 12.2 g/dL — ABNORMAL LOW (ref 13.0–17.0)
MCHC: 34.3 g/dL (ref 30.0–36.0)
MCV: 86.8 fl (ref 78.0–100.0)
Platelets: 159 10*3/uL (ref 150.0–400.0)
RBC: 4.09 Mil/uL — ABNORMAL LOW (ref 4.22–5.81)
RDW: 14.1 % (ref 11.5–15.5)
WBC: 6.9 10*3/uL (ref 4.0–10.5)

## 2019-02-13 LAB — COMPREHENSIVE METABOLIC PANEL
ALT: 14 U/L (ref 0–53)
AST: 16 U/L (ref 0–37)
Albumin: 4.5 g/dL (ref 3.5–5.2)
Alkaline Phosphatase: 69 U/L (ref 39–117)
BUN: 67 mg/dL — ABNORMAL HIGH (ref 6–23)
CO2: 21 mEq/L (ref 19–32)
Calcium: 8.8 mg/dL (ref 8.4–10.5)
Chloride: 105 mEq/L (ref 96–112)
Creatinine, Ser: 1.43 mg/dL (ref 0.40–1.50)
GFR: 49.07 mL/min — ABNORMAL LOW (ref >60.00)
Glucose, Bld: 422 mg/dL — ABNORMAL HIGH (ref 70–99)
Potassium: 4.7 mEq/L (ref 3.5–5.1)
Sodium: 136 mEq/L (ref 135–145)
Total Bilirubin: 0.4 mg/dL (ref 0.2–1.2)
Total Protein: 7.3 g/dL (ref 6.0–8.3)

## 2019-02-13 LAB — PSA: PSA: 9.45 ng/mL — ABNORMAL HIGH (ref 0.10–4.00)

## 2019-02-13 LAB — LDL CHOLESTEROL, DIRECT: Direct LDL: 75 mg/dL

## 2019-02-13 NOTE — Addendum Note (Signed)
Addended by: Jon Billings on: 02/13/2019 03:39 PM   Modules accepted: Orders

## 2019-02-13 NOTE — Progress Notes (Signed)
CARDIOLOGY OFFICE NOTE  Date:  02/14/2019    [name] Date of Birth: 05/03/1951 Medical Record #[id number]  PCP:  [name], MD  Cardiologist:  [name]   Chief Complaint  Patient presents with  . Arm Pain    Work in visit - seen for Dr. [name]    History of Present Illness: Scott Donaldson is a 68 y.o. male who presents today for a work in visit. Seen for Dr. [name].   He has a history of CAD, DM, HTN and HLD. Has had abnormal EKG - now with LBBB.   Has had cardiac cath back in December - found to have mild to moderate CAD - managed medically.   Last seen by [name], NP in December - he was feeling well. Had previously had uncontrolled DM - this was improving.   The patient does not have symptoms concerning for COVID-19 infection (fever, chills, cough, or new shortness of breath).   Comes in today. Here alone. He notes he has had left arm pain since Saturday - fairly constant. His arm feels numb. If he moves his arm - raises it up - he feels a discomfort in the left upper chest. He has had some neck pain as well. Seen by PCP yesterday - had EKG - chronic LBBB noted. Not short of breath. Had labs earlier this week - PSA grossly elevated - seeing GU next month.   Past Medical History:  Diagnosis Date  . Abdominal pain 08/04/2016  . Abnormal PSA 05/19/2015  . Arthralgia 11/12/2010  . Benign prostatic hyperplasia with urinary hesitancy 06/14/2018  . Cataract   . CELLULITIS, FOOT, RIGHT 07/02/2008   Qualifier: Diagnosis of  By: Marca Ancona RMA, Lucy    . Cough 08/04/2016  . Diabetes ([place]) 03/04/2007   Qualifier: Diagnosis of  By: Loanne Drilling MD, Jacelyn Pi   . DIABETES MELLITUS, TYPE II 03/04/2007  . Edema 07/02/2008   Qualifier: Diagnosis of  By: Marca Ancona RMA, Lucy    . Elevated LDL cholesterol level 06/14/2018  . Foot ulcer, right ([place]) 04/15/2014  . GERD (gastroesophageal reflux disease)   . HYPERCHOLESTEROLEMIA 07/29/2008  . HYPERTENSION 03/04/2007  . Knee pain,  left 05/16/2015  . Loss of vision 04/15/2014  . Nonintractable episodic headache 12/01/2016  . Psoriasis   . SHOULDER PAIN, BILATERAL 01/12/2010   Qualifier: Diagnosis of  By: Loanne Drilling MD, Jacelyn Pi   . Swelling of left foot 08/05/2017  . 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 COLONOSCOPY    . LEFT HEART CATH AND CORONARY ANGIOGRAPHY N/A 07/12/2018   Procedure: LEFT HEART CATH AND CORONARY ANGIOGRAPHY;  Surgeon: Wellington Hampshire, MD;  Location: [facility];  Service: Cardiovascular;  Laterality: N/A;  . stress cardiolite  05/30/2003     Medications: Current Meds  Medication Sig  . aspirin EC 81 MG tablet Take 1 tablet (81 mg total) by mouth daily.  [name] atorvastatin (LIPITOR) 20 MG tablet Take 1 tablet (20 mg total) by mouth every evening.  . docusate sodium (COLACE) 100 MG capsule Take 1 capsule (100 mg total) by mouth 2 (two) times daily.  [name] glucose blood test strip USE TO CHECK BLOOD SUGAR TWICE DAILY  . insulin NPH Human (NOVOLIN N RELION) 100 UNIT/ML injection Inject 1.1 mLs (110 Units total) into the skin daily before breakfast. And syringes 1/day  .  metoprolol succinate (TOPROL-XL) 25 MG 24 hr tablet TAKE 1 TABLET(25 MG) BY MOUTH DAILY  . olmesartan (BENICAR) 20 MG tablet TAKE 1 TABLET(20 MG) BY MOUTH DAILY     Allergies: No Known Allergies  Social History: The patient  reports that he quit smoking about 30 years ago. He has never used smokeless tobacco. He reports that he does not drink alcohol or use drugs.   Family History: The patient's family history includes Cancer in his father and mother; Cancer (age of onset: 42) in his brother; Diabetes in his father.   Review of Systems: Please see the history of present illness.   All other systems are reviewed and negative.   Physical Exam: VS:  BP (!) 144/70   Pulse (!) 50   Ht 6' (1.829 m)   Wt 202 lb (91.6 kg)    SpO2 98%   BMI 27.40 kg/m  .  BMI Body mass index is 27.4 kg/m.  Wt Readings from Last 3 Encounters:  02/14/19 202 lb (91.6 kg)  12/01/18 202 lb 3.2 oz (91.7 kg)  09/22/18 197 lb (89.4 kg)    General: Pleasant. Well developed, well nourished and in no acute distress.  Seems stiff with movement.  HEENT: Normal.  Neck: Supple, no JVD, carotid bruits, or masses noted.  Cardiac: Regular rate and rhythm. Soft outflow murmur. No edema.  Respiratory:  Lungs are clear to auscultation bilaterally with normal work of breathing.  GI: Soft and nontender.  MS: No deformity or atrophy. Gait and ROM intact. With raising his left arm he has obvious pain elicited in the arm and chest. He is not able to raise his left arm as high as he can on the right. His pulses in the left arm are 2+.  Skin: Warm and dry. Color is normal.  Neuro:  Strength and sensation are intact and no gross focal deficits noted.  Psych: Alert, appropriate and with normal affect.   LABORATORY DATA:  EKG:  EKG from Monday noted - NSR with LBBB. HR was 65.   Lab Results  Component Value Date   WBC 6.9 02/12/2019   HGB 12.2 (L) 02/12/2019   HCT 35.5 (L) 02/12/2019   PLT 159.0 02/12/2019   GLUCOSE 422 (H) 02/12/2019   CHOL 165 08/21/2018   TRIG 77.0 08/21/2018   HDL 45.80 08/21/2018   LDLDIRECT 75.0 02/12/2019   LDLCALC 104 (H) 08/21/2018   ALT 14 02/12/2019   AST 16 02/12/2019   NA 136 02/12/2019   K 4.7 02/12/2019   CL 105 02/12/2019   CREATININE 1.43 02/12/2019   BUN 67 (H) 02/12/2019   CO2 21 02/12/2019   TSH 1.18 08/04/2016   PSA 9.45 (H) 02/12/2019   HGBA1C 9.2 (A) 12/01/2018   MICROALBUR 12.5 (H) 08/21/2018     BNP (last 3 results) No results for input(s): BNP in the last 8760 hours.  ProBNP (last 3 results) No results for input(s): PROBNP in the last 8760 hours.   Other Studies Reviewed Today:  Cardiac cath 07/12/18  The left ventricular systolic function is normal.  LV end diastolic  pressure is mildly elevated.  The left ventricular ejection fraction is 55-65% by visual estimate.  Prox RCA lesion is 20% stenosed.  Dist RCA lesion is 40% stenosed.  Dist LM lesion is 20% stenosed.  Mid LAD lesion is 40% stenosed.  1. Mild to moderate nonobstructive coronary artery disease. Mildly calcified vessels overall. 2. Normal LV systolic function mildly elevated left ventricular end-diastolic pressure.  Recommendations: Continue aggressive medical therapy and treatment of risk factors.   NUC 07/04/18 Study Highlights     The left ventricular ejection fraction is normal (55-65%).  Nuclear stress EF: 56%.  There was no ST segment deviation noted during stress.  Findings consistent with prior myocardial infarction.  This is a low risk study.  Blood pressure demonstrated a normal response to exercise.  Possible small inferior wall infarct from apex to base no ischemia Defect persists in upright and supine stress images No RWMA;s EF normal 56% Low risk study      ASSESSMENT AND PLAN:  1. Left arm pain/numbness - obvious limited mobility of the left arm - ?rotator cuff injury with nerve compression. I have spoke to patient's daughter - he has not seen ortho in many years - she is going to arrange ortho visit thru patient's PCP.   2. CAD - chronic LBBB - recent cath 7 months ago - mild non obstructive disease - managed medically. No exertional symptoms. Having L arm pain that can be reproduced on exam today.   3. DM - not controlled  4. Elevated PSA - family aware - seeing GU next month.   5. HTN - BP is fair. Will follow.   6. COVID-19 Education: The signs and symptoms of COVID-19 were discussed with the patient and how to seek care for testing (follow up with PCP or arrange E-visit).  The importance of social distancing, staying at home, hand hygiene and wearing a mask when out in public were discussed today.  Current medicines are  reviewed with the patient today.  The patient does not have concerns regarding medicines other than what has been noted above.  The following changes have been made:  See above.  Labs/ tests ordered today include:   No orders of the defined types were placed in this encounter.    Disposition:   FU with me in 6 months. Daughter is going to arrange ortho referral.    Patient is agreeable to this plan and will call if any problems develop in the interim.   SignedTruitt Merle, NP  02/14/2019 9:45 AM  [facility] [address] [place],   [address] Phone: [phone] Fax: [phone]

## 2019-02-14 ENCOUNTER — Other Ambulatory Visit: Payer: Self-pay

## 2019-02-14 ENCOUNTER — Encounter: Payer: Self-pay | Admitting: Nurse Practitioner

## 2019-02-14 ENCOUNTER — Ambulatory Visit (INDEPENDENT_AMBULATORY_CARE_PROVIDER_SITE_OTHER): Payer: Medicare Other | Admitting: Nurse Practitioner

## 2019-02-14 VITALS — BP 144/70 | HR 50 | Ht 72.0 in | Wt 202.0 lb

## 2019-02-14 DIAGNOSIS — R9431 Abnormal electrocardiogram [ECG] [EKG]: Secondary | ICD-10-CM | POA: Diagnosis not present

## 2019-02-14 DIAGNOSIS — I2510 Atherosclerotic heart disease of native coronary artery without angina pectoris: Secondary | ICD-10-CM

## 2019-02-14 DIAGNOSIS — M79602 Pain in left arm: Secondary | ICD-10-CM | POA: Diagnosis not present

## 2019-02-14 DIAGNOSIS — E7800 Pure hypercholesterolemia, unspecified: Secondary | ICD-10-CM | POA: Diagnosis not present

## 2019-02-14 DIAGNOSIS — Z7189 Other specified counseling: Secondary | ICD-10-CM | POA: Diagnosis not present

## 2019-02-14 NOTE — Patient Instructions (Addendum)
After Visit Summary:  We will be checking the following labs today - NONE  Medication Instructions:    Continue with your current medicines.    If you need a refill on your cardiac medications before your next appointment, please call your pharmacy.     Testing/Procedures To Be Arranged:  N/A  Follow-Up:   See [place] in about 6 months  Your daughter has agreed to send a message to your PCP to get ortho referral.     At [facility], you and your health needs are our priority.  As part of our continuing mission to provide you with exceptional heart care, we have created designated Provider Care Teams.  These Care Teams include your primary Cardiologist (physician) and Advanced Practice Providers (APPs -  Physician Assistants and Nurse Practitioners) who all work together to provide you with the care you need, when you need it.  Special Instructions:  . Stay safe, stay home, wash your hands for at least 20 seconds and wear a mask when out in public.  . It was good to talk with you today.    Call the [place] office at [phone] if you have any questions, problems or concerns.

## 2019-02-15 NOTE — Addendum Note (Signed)
Addended by: Jon Billings on: 02/15/2019 08:41 AM   Modules accepted: Orders

## 2019-02-19 ENCOUNTER — Ambulatory Visit: Payer: Medicare Other | Admitting: Family Medicine

## 2019-02-19 ENCOUNTER — Other Ambulatory Visit: Payer: Self-pay

## 2019-02-19 ENCOUNTER — Encounter: Payer: Self-pay | Admitting: Family Medicine

## 2019-02-19 DIAGNOSIS — M5412 Radiculopathy, cervical region: Secondary | ICD-10-CM | POA: Diagnosis not present

## 2019-02-19 DIAGNOSIS — M1712 Unilateral primary osteoarthritis, left knee: Secondary | ICD-10-CM | POA: Diagnosis not present

## 2019-02-19 MED ORDER — GABAPENTIN 100 MG PO CAPS: 100.0000 mg | ORAL_CAPSULE | Freq: Three times a day (TID) | ORAL | 1 refills | Status: DC

## 2019-02-19 NOTE — Assessment & Plan Note (Signed)
Symptoms likely related to degenerative changes of the joint line and meniscus.  No significant pain today. -Pennsaid samples. -Counseled on home exercise therapy and supportive care. -We will try medial unloader brace due to thigh to calf ratio. -Could consider injections

## 2019-02-19 NOTE — Progress Notes (Signed)
Medication Samples have been provided to the patient.  Drug name: Pennsaid       Strength: 2%       Qty: 2 Boxes  LOT: [id number]  Exp.Date: 08/2019  Dosing instructions: Use a peasize amount and rub gently.  The patient has been instructed regarding the correct time, dose, and frequency of taking this medication, including desired effects and most common side effects.   Sherrie George, MA 9:06 AM 02/19/2019

## 2019-02-19 NOTE — Assessment & Plan Note (Signed)
Symptoms of the left arm seem most consistent with radiculopathy.  He is right-handed and the left hand is used for reaching out holding.  May be a stretching of the nerves with no significant degenerative change of the cervical x-ray. -Gabapentin. -Counseled on home exercise therapy and supportive care. -If no improvement will consider MRI with the consideration epidurals.

## 2019-02-19 NOTE — Patient Instructions (Signed)
Nice to meet you Please try the gabapentin.  Please start with 1 pill at night and increase to 2 or 3 times daily as you tolerate. Please try the scapular exercises Please try the exercises for the hip that will help with the knee. The [employer] rep will give you a call about the brace. Please send me a message in MyChart with any questions or updates.  Please see me back in 4 weeks or sooner if the knee flares up.  We can inject this..   --Dr. Raeford Razor

## 2019-02-19 NOTE — Progress Notes (Signed)
Scott Donaldson - 68 y.o. male MRN [id number]  Date of birth: 12-01-1950  SUBJECTIVE:  Including CC & ROS.  Chief Complaint  Patient presents with  . Neck Pain    left side    Scott Donaldson is a 68 y.o. male that is presenting with left arm altered sensation and left knee pain.  The left arm altered sensation is been acute over the past 2 weeks.  He has a history of similar symptoms.  He denies any specific inciting event or trauma.  It is occurring over the ulnar aspect of the arm.  He has not tried any medications.  His cardiology work-up was negative.  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  He still performs this from time to time.  He denies any loss of grip strength.  Symptoms are more constant.  The left knee pain is acute on chronic in nature.  Is occurring over the medial joint line.  It will swell up from time to time.  It is mild to severe.  Is sharp and throbbing.  It tries to give out when it is swollen.  Denies any inciting event or trauma.  Has not received any injections previously.  No prior surgery.  Localized to the knee.  Worse with going up and down a ladder walking.  Independent review of the cervical spine x-ray from 7/20 shows minimal degenerative changes.  Independent review of the left knee x-ray from 1/27 moderate medial joint space narrowing.    Review of Systems  Constitutional: Negative for fever.  HENT: Negative for congestion.   Respiratory: Negative for cough.   Cardiovascular: Negative for chest pain.  Gastrointestinal: Negative for abdominal pain.  Musculoskeletal: Positive for arthralgias and joint swelling.  Skin: Negative for color change.  Neurological: Positive for numbness.  Hematological: Negative for adenopathy.    HISTORY: Past Medical, Surgical, Social, and Family History Reviewed & Updated per EMR.   Pertinent Historical Findings include:  Past Medical History:  Diagnosis Date  . Abdominal pain 08/04/2016  . Abnormal PSA  05/19/2015  . Arthralgia 11/12/2010  . Benign prostatic hyperplasia with urinary hesitancy 06/14/2018  . Cataract   . CELLULITIS, FOOT, RIGHT 07/02/2008   Qualifier: Diagnosis of  By: Marca Ancona RMA, Lucy    . Cough 08/04/2016  . Diabetes ([place]) 03/04/2007   Qualifier: Diagnosis of  By: Loanne Drilling MD, Jacelyn Pi   . DIABETES MELLITUS, TYPE II 03/04/2007  . Edema 07/02/2008   Qualifier: Diagnosis of  By: Marca Ancona RMA, Lucy    . Elevated LDL cholesterol level 06/14/2018  . Foot ulcer, right ([place]) 04/15/2014  . GERD (gastroesophageal reflux disease)   . HYPERCHOLESTEROLEMIA 07/29/2008  . HYPERTENSION 03/04/2007  . Knee pain, left 05/16/2015  . Loss of vision 04/15/2014  . Nonintractable episodic headache 12/01/2016  . Psoriasis   . SHOULDER PAIN, BILATERAL 01/12/2010   Qualifier: Diagnosis of  By: Loanne Drilling MD, Jacelyn Pi   . Swelling of left foot 08/05/2017  . 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 COLONOSCOPY    . LEFT HEART CATH AND CORONARY ANGIOGRAPHY N/A 07/12/2018   Procedure: LEFT HEART CATH AND CORONARY ANGIOGRAPHY;  Surgeon: Wellington Hampshire, MD;  Location: [facility];  Service: Cardiovascular;  Laterality: N/A;  . stress cardiolite  05/30/2003    No Known Allergies  Family History  Problem Relation Age  of Onset  . Cancer Mother        Breast Cancer  . Cancer Father        uncertain type  . Diabetes Father   . Cancer Brother 69       Colon Cancer  . Colon cancer Neg Hx   . Rectal cancer Neg Hx   . Stomach cancer Neg Hx      Social History   Socioeconomic History  . Marital status: Single    Spouse name: Not on file  . Number of children: 2  . Years of education: 10  . Highest education level: Not on file  Occupational History  . Occupation: roofer  Social Needs  . Financial resource strain: Not on file  . Food insecurity    Worry: Not on file    Inability: Not on file   . Transportation needs    Medical: Not on file    Non-medical: Not on file  Tobacco Use  . Smoking status: Former Smoker    Quit date: 07/26/1988    Years since quitting: 30.5  . Smokeless tobacco: Never Used  Substance and Sexual Activity  . Alcohol use: No  . Drug use: No  . Sexual activity: Not on file  Lifestyle  . Physical activity    Days per week: Not on file    Minutes per session: Not on file  . Stress: Not on file  Relationships  . Social Herbalist on phone: Not on file    Gets together: Not on file    Attends religious service: Not on file    Active member of club or organization: Not on file    Attends meetings of clubs or organizations: Not on file    Relationship status: Not on file  . Intimate partner violence    Fear of current or ex partner: Not on file    Emotionally abused: Not on file    Physically abused: Not on file    Forced sexual activity: Not on file  Other Topics Concern  . Not on file  Social History Narrative   Lives with daughter in a one story home.  Has 2 children.  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  Education: 10th grade.      PHYSICAL EXAM:  VS: BP (!) 167/63   Pulse (!) 54   Ht 6' (1.829 m)   Wt 202 lb (91.6 kg)   BMI 27.40 kg/m  Physical Exam Gen: NAD, alert, cooperative with exam, well-appearing ENT: normal lips, normal nasal mucosa,  Eye: normal EOM, normal conjunctiva and lids CV:  no edema, +2 pedal pulses   Resp: no accessory muscle use, non-labored,  Skin: no rashes, no areas of induration  Neuro: normal tone, normal sensation to touch Psych:  normal insight, alert and oriented MSK:  Left knee: Tenderness palpation of the medial joint line. Normal range of motion. Some valgus deviation. Instability with valgus varus stress testing. No effusion. Negative Murray's test. Negative patellar grind. Neck/left arm: Normal range of motion in flexion extension. Normal lateral rotation. Normal shoulder range of  motion. Negative empty can test. Normal grip strength. No signs of atrophy. Normal strength resistance with finger abduction and abduction. Normal wrist range of motion. Neurovascular intact     ASSESSMENT & PLAN:   OA (osteoarthritis) of knee Symptoms likely related to degenerative changes of the joint line and meniscus.  No significant pain today. -Pennsaid samples. -Counseled on home exercise therapy and  supportive care. -We will try medial unloader brace due to thigh to calf ratio. -Could consider injections  Cervical radiculopathy Symptoms of the left arm seem most consistent with radiculopathy.  He is right-handed and the left hand is used for reaching out holding.  May be a stretching of the nerves with no significant degenerative change of the cervical x-ray. -Gabapentin. -Counseled on home exercise therapy and supportive care. -If no improvement will consider MRI with the consideration epidurals.

## 2019-03-05 DIAGNOSIS — R3915 Urgency of urination: Secondary | ICD-10-CM | POA: Diagnosis not present

## 2019-03-19 ENCOUNTER — Ambulatory Visit: Payer: Medicare Other | Admitting: Family Medicine

## 2019-04-18 ENCOUNTER — Other Ambulatory Visit: Payer: Self-pay

## 2019-04-18 ENCOUNTER — Encounter: Payer: Self-pay | Admitting: Family Medicine

## 2019-04-18 DIAGNOSIS — I2510 Atherosclerotic heart disease of native coronary artery without angina pectoris: Secondary | ICD-10-CM

## 2019-04-18 DIAGNOSIS — E7800 Pure hypercholesterolemia, unspecified: Secondary | ICD-10-CM

## 2019-04-18 MED ORDER — ATORVASTATIN CALCIUM 20 MG PO TABS: 20.0000 mg | ORAL_TABLET | Freq: Every evening | ORAL | 2 refills | Status: DC

## 2019-04-18 NOTE — Patient Outreach (Signed)
[facility]) Care Management  04/18/2019  Scott Donaldson 1950/11/23 [id number]   Medication Adherence call to [name] [facility] Identifiers Verify spoke with patients daughter patient is past due on Atorvastatin 20 mg she explain patient is taking 1 tablet daily,patient has a prescription waiting at the pharmacy. Scott Donaldson is showing past due under [facility].   [place] Management Direct Dial [phone]  Fax [phone] [email or web address]

## 2019-05-08 ENCOUNTER — Encounter: Payer: Self-pay | Admitting: *Deleted

## 2019-05-30 ENCOUNTER — Encounter: Payer: Self-pay | Admitting: Family Medicine

## 2019-05-31 ENCOUNTER — Ambulatory Visit (INDEPENDENT_AMBULATORY_CARE_PROVIDER_SITE_OTHER): Payer: Medicare Other

## 2019-05-31 ENCOUNTER — Encounter: Payer: Self-pay | Admitting: Family Medicine

## 2019-05-31 DIAGNOSIS — Z23 Encounter for immunization: Secondary | ICD-10-CM | POA: Diagnosis not present

## 2019-07-12 ENCOUNTER — Other Ambulatory Visit: Payer: Self-pay | Admitting: Family Medicine

## 2019-07-12 DIAGNOSIS — E1142 Type 2 diabetes mellitus with diabetic polyneuropathy: Secondary | ICD-10-CM

## 2019-07-12 DIAGNOSIS — I2510 Atherosclerotic heart disease of native coronary artery without angina pectoris: Secondary | ICD-10-CM

## 2019-07-12 DIAGNOSIS — I10 Essential (primary) hypertension: Secondary | ICD-10-CM

## 2019-07-12 MED ORDER — METOPROLOL SUCCINATE ER 25 MG PO TB24: ORAL_TABLET | ORAL | 0 refills | Status: DC

## 2019-07-12 MED ORDER — OLMESARTAN MEDOXOMIL 20 MG PO TABS: ORAL_TABLET | ORAL | 0 refills | Status: DC

## 2019-07-16 NOTE — Telephone Encounter (Signed)
Pt has an appointment that has been scheduled for 07/23/19

## 2019-07-23 ENCOUNTER — Encounter: Payer: Self-pay | Admitting: Family Medicine

## 2019-07-23 ENCOUNTER — Ambulatory Visit (INDEPENDENT_AMBULATORY_CARE_PROVIDER_SITE_OTHER): Payer: Medicare Other | Admitting: Family Medicine

## 2019-07-23 ENCOUNTER — Other Ambulatory Visit: Payer: Self-pay

## 2019-07-23 VITALS — BP 154/72 | HR 65 | Temp 97.8°F | Ht 73.0 in | Wt 205.0 lb

## 2019-07-23 DIAGNOSIS — E7800 Pure hypercholesterolemia, unspecified: Secondary | ICD-10-CM

## 2019-07-23 DIAGNOSIS — E1142 Type 2 diabetes mellitus with diabetic polyneuropathy: Secondary | ICD-10-CM

## 2019-07-23 DIAGNOSIS — I10 Essential (primary) hypertension: Secondary | ICD-10-CM | POA: Diagnosis not present

## 2019-07-23 DIAGNOSIS — E1165 Type 2 diabetes mellitus with hyperglycemia: Secondary | ICD-10-CM | POA: Diagnosis not present

## 2019-07-23 DIAGNOSIS — Z794 Long term (current) use of insulin: Secondary | ICD-10-CM | POA: Diagnosis not present

## 2019-07-23 DIAGNOSIS — R972 Elevated prostate specific antigen [PSA]: Secondary | ICD-10-CM

## 2019-07-23 LAB — COMPREHENSIVE METABOLIC PANEL
ALT: 15 U/L (ref 0–53)
AST: 14 U/L (ref 0–37)
Albumin: 4.3 g/dL (ref 3.5–5.2)
Alkaline Phosphatase: 68 U/L (ref 39–117)
BUN: 25 mg/dL — ABNORMAL HIGH (ref 6–23)
CO2: 24 mEq/L (ref 19–32)
Calcium: 9.6 mg/dL (ref 8.4–10.5)
Chloride: 105 mEq/L (ref 96–112)
Creatinine, Ser: 0.99 mg/dL (ref 0.40–1.50)
GFR: 74.91 mL/min (ref >60.00)
Glucose, Bld: 212 mg/dL — ABNORMAL HIGH (ref 70–99)
Potassium: 4.4 mEq/L (ref 3.5–5.1)
Sodium: 137 mEq/L (ref 135–145)
Total Bilirubin: 0.4 mg/dL (ref 0.2–1.2)
Total Protein: 7.2 g/dL (ref 6.0–8.3)

## 2019-07-23 LAB — CBC
HCT: 40.8 % (ref 39.0–52.0)
Hemoglobin: 13.9 g/dL (ref 13.0–17.0)
MCHC: 34 g/dL (ref 30.0–36.0)
MCV: 85.1 fl (ref 78.0–100.0)
Platelets: 181 10*3/uL (ref 150.0–400.0)
RBC: 4.79 Mil/uL (ref 4.22–5.81)
RDW: 13.1 % (ref 11.5–15.5)
WBC: 6.3 10*3/uL (ref 4.0–10.5)

## 2019-07-23 LAB — HEMOGLOBIN A1C: Hgb A1c MFr Bld: 10.4 % — ABNORMAL HIGH (ref 4.6–6.5)

## 2019-07-23 LAB — LIPID PANEL
Cholesterol: 198 mg/dL (ref 0–200)
HDL: 42.6 mg/dL (ref >39.00)
LDL Cholesterol: 117 mg/dL — ABNORMAL HIGH (ref 0–99)
NonHDL: 155.06
Total CHOL/HDL Ratio: 5
Triglycerides: 191 mg/dL — ABNORMAL HIGH (ref 0.0–149.0)
VLDL: 38.2 mg/dL (ref 0.0–40.0)

## 2019-07-23 LAB — URINALYSIS, ROUTINE W REFLEX MICROSCOPIC
Bilirubin Urine: NEGATIVE
Hgb urine dipstick: NEGATIVE
Ketones, ur: NEGATIVE
Leukocytes,Ua: NEGATIVE
Nitrite: NEGATIVE
RBC / HPF: NONE SEEN (ref 0–?)
Specific Gravity, Urine: >=1.03 — AB (ref 1.000–1.030)
Total Protein, Urine: 100 — AB
Urine Glucose: 250 — AB
Urobilinogen, UA: 0.2 (ref 0.0–1.0)
pH: 5.5 (ref 5.0–8.0)

## 2019-07-23 LAB — LDL CHOLESTEROL, DIRECT: Direct LDL: 96 mg/dL

## 2019-07-23 LAB — MICROALBUMIN / CREATININE URINE RATIO
Creatinine,U: 157.7 mg/dL
Microalb Creat Ratio: 24 mg/g (ref 0.0–30.0)
Microalb, Ur: 37.8 mg/dL — ABNORMAL HIGH (ref 0.0–1.9)

## 2019-07-23 LAB — PSA: PSA: 11.88 ng/mL — ABNORMAL HIGH (ref 0.10–4.00)

## 2019-07-23 NOTE — Progress Notes (Signed)
Established Patient Office Visit  Subjective:  Patient ID: Scott Donaldson, male    DOB: June 13, 1951  Age: 68 y.o. MRN: [id number]  CC:  Chief Complaint  Patient presents with  . Follow-up    follow up no concerns at this time, last visit July 2020    HPI Scott Donaldson presents for follow-up of his hypertension, diabetes, elevated cholesterol and PSA.  BP is up today.  It was not clear to me whether or not he was taking his metoprolol and olmesartan.  He was not sure either.  He did see the urologist for his elevated PSA.  Urine flow has improved since they put him on a medicine.  He is not certain what that medication is.  He missed his follow-up appointment for his elevated PSA.  Continues to take the NPH insulin for his diabetes.  Blood sugars have been elevated by his report.  Daughter came in with him today.  She became upset when we needed to screen her also for Covid symptoms left and went back out to the car.  Past Medical History:  Diagnosis Date  . Abdominal pain 08/04/2016  . Abnormal PSA 05/19/2015  . Arthralgia 11/12/2010  . Benign prostatic hyperplasia with urinary hesitancy 06/14/2018  . Cataract   . CELLULITIS, FOOT, RIGHT 07/02/2008   Qualifier: Diagnosis of  By: Marca Ancona RMA, Lucy    . Cough 08/04/2016  . Diabetes ([place]) 03/04/2007   Qualifier: Diagnosis of  By: Loanne Drilling MD, Jacelyn Pi   . DIABETES MELLITUS, TYPE II 03/04/2007  . Edema 07/02/2008   Qualifier: Diagnosis of  By: Marca Ancona RMA, Lucy    . Elevated LDL cholesterol level 06/14/2018  . Foot ulcer, right ([place]) 04/15/2014  . GERD (gastroesophageal reflux disease)   . HYPERCHOLESTEROLEMIA 07/29/2008  . HYPERTENSION 03/04/2007  . Knee pain, left 05/16/2015  . Loss of vision 04/15/2014  . Nonintractable episodic headache 12/01/2016  . Psoriasis   . SHOULDER PAIN, BILATERAL 01/12/2010   Qualifier: Diagnosis of  By: Loanne Drilling MD, Jacelyn Pi   . Swelling of left foot 08/05/2017  . 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 COLONOSCOPY    . LEFT HEART CATH AND CORONARY ANGIOGRAPHY N/A 07/12/2018   Procedure: LEFT HEART CATH AND CORONARY ANGIOGRAPHY;  Surgeon: Wellington Hampshire, MD;  Location: [facility];  Service: Cardiovascular;  Laterality: N/A;  . stress cardiolite  05/30/2003    Family History  Problem Relation Age of Onset  . Cancer Mother        Breast Cancer  . Cancer Father        uncertain type  . Diabetes Father   . Cancer Brother 9       Colon Cancer  . Colon cancer Neg Hx   . Rectal cancer Neg Hx   . Stomach cancer Neg Hx     Social History   Socioeconomic History  . Marital status: Single    Spouse name: Not on file  . Number of children: 2  . Years of education: 10  . Highest education level: Not on file  Occupational History  . Occupation: roofer  Tobacco Use  . Smoking status: Former Smoker    Quit date: 07/26/1988    Years since quitting: 31.0  . Smokeless tobacco: Never Used  Substance and Sexual Activity  . Alcohol use: No  .  Drug use: No  . Sexual activity: Not on file  Other Topics Concern  . Not on file  Social History Narrative   Lives with daughter in a one story home.  Has 2 children.  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  Education: 10th grade.    Social Determinants of Health   Financial Resource Strain:   . Difficulty of Paying Living Expenses: Not on file  Food Insecurity:   . Worried About Charity fundraiser in the Last Year: Not on file  . Ran Out of Food in the Last Year: Not on file  Transportation Needs:   . Lack of Transportation (Medical): Not on file  . Lack of Transportation (Non-Medical): Not on file  Physical Activity:   . Days of Exercise per Week: Not on file  . Minutes of Exercise per Session: Not on file  Stress:   . Feeling of Stress : Not on file  Social Connections:   . Frequency of Communication with Friends and Family: Not on  file  . Frequency of Social Gatherings with Friends and Family: Not on file  . Attends Religious Services: Not on file  . Active Member of Clubs or Organizations: Not on file  . Attends Archivist Meetings: Not on file  . Marital Status: Not on file  Intimate Partner Violence:   . Fear of Current or Ex-Partner: Not on file  . Emotionally Abused: Not on file  . Physically Abused: Not on file  . Sexually Abused: Not on file    Outpatient Medications Prior to Visit  Medication Sig Dispense Refill  . aspirin EC 81 MG tablet Take 1 tablet (81 mg total) by mouth daily.    [name] atorvastatin (LIPITOR) 20 MG tablet Take 1 tablet (20 mg total) by mouth every evening. 90 tablet 2  . glucose blood test strip USE TO CHECK BLOOD SUGAR TWICE DAILY 100 each 12  . insulin NPH Human (NOVOLIN N RELION) 100 UNIT/ML injection Inject 1.1 mLs (110 Units total) into the skin daily before breakfast. And syringes 1/day 40 mL 11  . docusate sodium (COLACE) 100 MG capsule Take 1 capsule (100 mg total) by mouth 2 (two) times daily. (Patient not taking: Reported on 07/23/2019) 10 capsule 0  . gabapentin (NEURONTIN) 100 MG capsule Take 1 capsule (100 mg total) by mouth 3 (three) times daily. 90 capsule 1  . metoprolol succinate (TOPROL-XL) 25 MG 24 hr tablet TAKE 1 TABLET(25 MG) BY MOUTH DAILY 90 tablet 0  . olmesartan (BENICAR) 20 MG tablet TAKE 1 TABLET(20 MG) BY MOUTH DAILY 90 tablet 0   No facility-administered medications prior to visit.    No Known Allergies  ROS Review of Systems  Constitutional: Negative.   HENT: Negative.   Eyes: Negative for photophobia and visual disturbance.  Respiratory: Negative.   Cardiovascular: Negative.   Gastrointestinal: Negative.   Endocrine: Negative for polyphagia and polyuria.  Genitourinary: Negative.   Musculoskeletal: Negative for joint swelling.  Skin: Negative for pallor and rash.  Neurological: Negative for light-headedness and headaches.   Hematological: Does not bruise/bleed easily.  Psychiatric/Behavioral: Negative.       Objective:    Physical Exam  Constitutional: He is oriented to person, place, and time. He appears well-developed and well-nourished. No distress.  HENT:  Head: Normocephalic and atraumatic.  Right Ear: External ear normal.  Left Ear: External ear normal.  Eyes: Conjunctivae are normal. Right eye exhibits no discharge. Left eye exhibits no discharge. No scleral  icterus.  Neck: No JVD present. No tracheal deviation present. No thyromegaly present.  Cardiovascular: Normal rate and regular rhythm.  Murmur heard. Pulmonary/Chest: Effort normal and breath sounds normal. No stridor.  Abdominal: Bowel sounds are normal.  Musculoskeletal:        General: No edema.  Lymphadenopathy:    He has no cervical adenopathy.  Neurological: He is alert and oriented to person, place, and time.  Skin: Skin is warm and dry. He is not diaphoretic.  Psychiatric: He has a normal mood and affect. His behavior is normal.    BP (!) 154/72   Pulse 65   Temp 97.8 F (36.6 C) (Oral)   Ht 6' 1" (1.854 m)   Wt 205 lb (93 kg)   BMI 27.05 kg/m  Wt Readings from Last 3 Encounters:  07/23/19 205 lb (93 kg)  02/19/19 202 lb (91.6 kg)  02/14/19 202 lb (91.6 kg)     Health Maintenance Due  Topic Date Due  . HEMOGLOBIN A1C  06/03/2019  . OPHTHALMOLOGY EXAM  07/18/2019    There are no preventive care reminders to display for this patient.  Lab Results  Component Value Date   TSH 1.18 08/04/2016   Lab Results  Component Value Date   WBC 6.9 02/12/2019   HGB 12.2 (L) 02/12/2019   HCT 35.5 (L) 02/12/2019   MCV 86.8 02/12/2019   PLT 159.0 02/12/2019   Lab Results  Component Value Date   NA 136 02/12/2019   K 4.7 02/12/2019   CO2 21 02/12/2019   GLUCOSE 422 (H) 02/12/2019   BUN 67 (H) 02/12/2019   CREATININE 1.43 02/12/2019   BILITOT 0.4 02/12/2019   ALKPHOS 69 02/12/2019   AST 16 02/12/2019   ALT 14  02/12/2019   PROT 7.3 02/12/2019   ALBUMIN 4.5 02/12/2019   CALCIUM 8.8 02/12/2019   GFR 49.07 (L) 02/12/2019   Lab Results  Component Value Date   CHOL 165 08/21/2018   Lab Results  Component Value Date   HDL 45.80 08/21/2018   Lab Results  Component Value Date   LDLCALC 104 (H) 08/21/2018   Lab Results  Component Value Date   TRIG 77.0 08/21/2018   Lab Results  Component Value Date   CHOLHDL 4 08/21/2018   Lab Results  Component Value Date   HGBA1C 9.2 (A) 12/01/2018      Assessment & Plan:   Problem List Items Addressed This Visit      Cardiovascular and Mediastinum   Essential hypertension   Relevant Orders   Comp Met (CMET)   CBC   Urine Microalbumin w/creat. ratio     Endocrine   Type 2 diabetes mellitus with diabetic polyneuropathy, with long-term current use of insulin (HCC)   Relevant Orders   HgB A1c   Urinalysis, Routine w reflex microscopic   Urine Microalbumin w/creat. ratio     Other   Elevated PSA   Relevant Orders   PSA   Elevated LDL cholesterol level - Primary   Relevant Orders   Comp Met (CMET)   Direct LDL   Lipid Profile   Urinalysis, Routine w reflex microscopic      No orders of the defined types were placed in this encounter.   Follow-up: Return in about 2 weeks (around 08/06/2019), or bring all medcines that you are taking..  Will check blood work today.  Return in 2 weeks with all of his medications so that I can see exactly what he is taking  and for discussion of today's blood work.  [name], MD

## 2019-08-03 NOTE — Progress Notes (Deleted)
CARDIOLOGY OFFICE NOTE  Date:  08/07/2019    [name] Date of Birth: 19-Apr-1951 Medical Record [id number]  PCP:  [name], MD  Cardiologist:  Gillian Shields   No chief complaint on file.   History of Present Illness: DMARCO FILTZ is a 70 y.o. male who presents today for a 6 month check.  Seen for Dr. [name].   He has a history of CAD, DM, HTN and HLD. Has had abnormal EKG - now with LBBB.   Has had cardiac cath back in December 2019 - found to have mild to moderate CAD - managed medically.   I last saw him back in July - some atypical arm and chest pain - had obvious mobility issues - asked to follow up with ortho. Chronic LBBB. PSA grossly elevated.    The patient {does/does not:200015} have symptoms concerning for COVID-19 infection (fever, chills, cough, or new shortness of breath).   Comes in today. Here with   Past Medical History:  Diagnosis Date  . Abdominal pain 08/04/2016  . Abnormal PSA 05/19/2015  . Arthralgia 11/12/2010  . Benign prostatic hyperplasia with urinary hesitancy 06/14/2018  . Cataract   . CELLULITIS, FOOT, RIGHT 07/02/2008   Qualifier: Diagnosis of  By: Marca Ancona RMA, Lucy    . Cough 08/04/2016  . Diabetes ([place]) 03/04/2007   Qualifier: Diagnosis of  By: Loanne Drilling MD, Jacelyn Pi   . DIABETES MELLITUS, TYPE II 03/04/2007  . Edema 07/02/2008   Qualifier: Diagnosis of  By: Marca Ancona RMA, Lucy    . Elevated LDL cholesterol level 06/14/2018  . Foot ulcer, right ([place]) 04/15/2014  . GERD (gastroesophageal reflux disease)   . HYPERCHOLESTEROLEMIA 07/29/2008  . HYPERTENSION 03/04/2007  . Knee pain, left 05/16/2015  . Loss of vision 04/15/2014  . Nonintractable episodic headache 12/01/2016  . Psoriasis   . SHOULDER PAIN, BILATERAL 01/12/2010   Qualifier: Diagnosis of  By: Loanne Drilling MD, Jacelyn Pi   . Swelling of left foot 08/05/2017  . 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 COLONOSCOPY    . LEFT HEART CATH AND CORONARY ANGIOGRAPHY N/A 07/12/2018   Procedure: LEFT HEART CATH AND CORONARY ANGIOGRAPHY;  Surgeon: Wellington Hampshire, MD;  Location: [facility];  Service: Cardiovascular;  Laterality: N/A;  . stress cardiolite  05/30/2003     Medications: No outpatient medications have been marked as taking for the 08/08/19 encounter (Appointment) with Burtis Junes, NP.     Allergies: No Known Allergies  Social History: The patient  reports that he quit smoking about 31 years ago. He has never used smokeless tobacco. He reports that he does not drink alcohol or use drugs.   Family History: The patient's ***family history includes Cancer in his father and mother; Cancer (age of onset: 48) in his brother; Diabetes in his father.   Review of Systems: Please see the history of present illness.   All other systems are reviewed and negative.   Physical Exam: VS:  There were no vitals taken for this visit. [name]  BMI There is no height or weight on file to calculate BMI.  Wt Readings from Last 3 Encounters:  07/23/19 205 lb (93 kg)  02/19/19 202 lb (91.6 kg)  02/14/19 202 lb (91.6 kg)    General: Pleasant. Well developed, well nourished and in no acute distress.  HEENT: Normal.  Neck: Supple, no JVD, carotid bruits, or masses noted.  Cardiac: ***Regular rate and rhythm. No murmurs, rubs, or gallops. No edema.  Respiratory:  Lungs are clear to auscultation bilaterally with normal work of breathing.  GI: Soft and nontender.  MS: No deformity or atrophy. Gait and ROM intact.  Skin: Warm and dry. Color is normal.  Neuro:  Strength and sensation are intact and no gross focal deficits noted.  Psych: Alert, appropriate and with normal affect.   LABORATORY DATA:  EKG:  EKG {ACTION; IS/IS [id number] ordered today. This demonstrates ***.  Lab Results  Component Value Date   WBC 6.3 07/23/2019   HGB 13.9  07/23/2019   HCT 40.8 07/23/2019   PLT 181.0 07/23/2019   GLUCOSE 212 (H) 07/23/2019   CHOL 198 07/23/2019   TRIG 191.0 (H) 07/23/2019   HDL 42.60 07/23/2019   LDLDIRECT 96.0 07/23/2019   LDLCALC 117 (H) 07/23/2019   ALT 15 07/23/2019   AST 14 07/23/2019   NA 137 07/23/2019   K 4.4 07/23/2019   CL 105 07/23/2019   CREATININE 0.99 07/23/2019   BUN 25 (H) 07/23/2019   CO2 24 07/23/2019   TSH 1.18 08/04/2016   PSA 11.88 (H) 07/23/2019   HGBA1C 10.4 (H) 07/23/2019   MICROALBUR 37.8 (H) 07/23/2019     BNP (last 3 results) No results for input(s): BNP in the last 8760 hours.  ProBNP (last 3 results) No results for input(s): PROBNP in the last 8760 hours.   Other Studies Reviewed Today:  Cardiac cath 07/12/18  The left ventricular systolic function is normal.  LV end diastolic pressure is mildly elevated.  The left ventricular ejection fraction is 55-65% by visual estimate.  Prox RCA lesion is 20% stenosed.  Dist RCA lesion is 40% stenosed.  Dist LM lesion is 20% stenosed.  Mid LAD lesion is 40% stenosed.  1. Mild to moderate nonobstructive coronary artery disease. Mildly calcified vessels overall. 2. Normal LV systolic function mildly elevated left ventricular end-diastolic pressure.  Recommendations: Continue aggressive medical therapy and treatment of risk factors.   NUC12/10/19 Study Highlights     The left ventricular ejection fraction is normal (55-65%).  Nuclear stress EF: 56%.  There was no ST segment deviation noted during stress.  Findings consistent with prior myocardial infarction.  This is a low risk study.  Blood pressure demonstrated a normal response to exercise.  Possible small inferior wall infarct from apex to base no ischemia Defect persists in upright and supine stress images No RWMA;s EF normal 56% Low risk study      ASSESSMENT AND PLAN:  1.CAD  2. LBBB  3. DM  4. Elevated PSA  5. HTN  6.  COVID-19 Education: The signs and symptoms of COVID-19 were discussed with the patient and how to seek care for testing (follow up with PCP or arrange E-visit).  The importance of social distancing, staying at home, hand hygiene and wearing a mask when out in public were discussed today.  Current medicines are reviewed with the patient today.  The patient does not have concerns regarding medicines other than what has been noted above.  The following changes have been made:  See above.  Labs/ tests ordered today include:   No orders of the defined types were placed in this encounter.    Disposition:   FU with *** in {gen number [id number] {Days to years:10300}.   Patient is agreeable to this plan and will call if any problems  develop in the interim.   SignedTruitt Merle, NP  08/07/2019 12:31 PM  [facility] [address] [place], [place]  [address] Phone: [phone] Fax: [phone]

## 2019-08-06 ENCOUNTER — Ambulatory Visit: Payer: Medicare Other | Admitting: Nurse Practitioner

## 2019-08-06 ENCOUNTER — Ambulatory Visit: Payer: Medicare Other | Admitting: Family Medicine

## 2019-08-07 ENCOUNTER — Ambulatory Visit: Payer: Medicare Other | Admitting: Family Medicine

## 2019-08-08 ENCOUNTER — Ambulatory Visit: Payer: Medicare Other | Admitting: Nurse Practitioner

## 2019-08-29 ENCOUNTER — Encounter: Payer: Self-pay | Admitting: Family Medicine

## 2019-08-29 ENCOUNTER — Telehealth (INDEPENDENT_AMBULATORY_CARE_PROVIDER_SITE_OTHER): Payer: Medicare Other | Admitting: Family Medicine

## 2019-08-29 VITALS — Ht 73.0 in

## 2019-08-29 DIAGNOSIS — E7800 Pure hypercholesterolemia, unspecified: Secondary | ICD-10-CM

## 2019-08-29 DIAGNOSIS — R972 Elevated prostate specific antigen [PSA]: Secondary | ICD-10-CM

## 2019-08-29 DIAGNOSIS — E1165 Type 2 diabetes mellitus with hyperglycemia: Secondary | ICD-10-CM

## 2019-08-29 DIAGNOSIS — E1142 Type 2 diabetes mellitus with diabetic polyneuropathy: Secondary | ICD-10-CM

## 2019-08-29 DIAGNOSIS — Z794 Long term (current) use of insulin: Secondary | ICD-10-CM

## 2019-08-29 DIAGNOSIS — I2510 Atherosclerotic heart disease of native coronary artery without angina pectoris: Secondary | ICD-10-CM | POA: Diagnosis not present

## 2019-08-29 DIAGNOSIS — I10 Essential (primary) hypertension: Secondary | ICD-10-CM

## 2019-08-29 MED ORDER — METOPROLOL SUCCINATE ER 50 MG PO TB24: 50.0000 mg | ORAL_TABLET | Freq: Every day | ORAL | 3 refills | Status: DC

## 2019-08-29 MED ORDER — ATORVASTATIN CALCIUM 40 MG PO TABS: 40.0000 mg | ORAL_TABLET | Freq: Every day | ORAL | 3 refills | Status: DC

## 2019-08-29 NOTE — Progress Notes (Signed)
Established Patient Office Visit  Subjective:  Patient ID: Scott Donaldson, male    DOB: 07/17/1951  Age: 69 y.o. MRN: [id number]  CC:  Chief Complaint  Patient presents with  . Follow-up    discuss labs with patient and daughter    HPI Scott Donaldson presents for follow-up of his multiple medical issues including uncontrolled diabetes, elevated LDL cholesterol, uncontrolled hypertension and elevating PSA.  Patient ensures compliance with his NPH insulin.  He has tried Metformin in the past and it has not been effective although he did tolerate the drug.  Assures me that he is taking his atorvastatin daily.  LDL remains elevated for his medical history.  Assures compliance with the Benicar and low-dose metoprolol.  PSA has been elevated over the last year or so.  He has been reluctant to go for urology consultation.  Daughter is present for the visit.  I believe that she should be present for all of his visits.  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  Past Medical History:  Diagnosis Date  . Abdominal pain 08/04/2016  . Abnormal PSA 05/19/2015  . Arthralgia 11/12/2010  . Benign prostatic hyperplasia with urinary hesitancy 06/14/2018  . Cataract   . CELLULITIS, FOOT, RIGHT 07/02/2008   Qualifier: Diagnosis of  By: Marca Ancona RMA, Lucy    . Cough 08/04/2016  . Diabetes ([place]) 03/04/2007   Qualifier: Diagnosis of  By: Loanne Drilling MD, Jacelyn Pi   . DIABETES MELLITUS, TYPE II 03/04/2007  . Edema 07/02/2008   Qualifier: Diagnosis of  By: Marca Ancona RMA, Lucy    . Elevated LDL cholesterol level 06/14/2018  . Foot ulcer, right ([place]) 04/15/2014  . GERD (gastroesophageal reflux disease)   . HYPERCHOLESTEROLEMIA 07/29/2008  . HYPERTENSION 03/04/2007  . Knee pain, left 05/16/2015  . Loss of vision 04/15/2014  . Nonintractable episodic headache 12/01/2016  . Psoriasis   . SHOULDER PAIN, BILATERAL 01/12/2010   Qualifier: Diagnosis of  By: Loanne Drilling MD, Jacelyn Pi   . Swelling of left foot 08/05/2017  . TUBULOVILLOUS ADENOMA,  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   Qualifier: Diagnosis of  By: Loanne Drilling MD, Jacelyn Pi     Past Surgical History:  Procedure Laterality Date  . COLONOSCOPY    . LEFT HEART CATH AND CORONARY ANGIOGRAPHY N/A 07/12/2018   Procedure: LEFT HEART CATH AND CORONARY ANGIOGRAPHY;  Surgeon: Wellington Hampshire, MD;  Location: [facility];  Service: Cardiovascular;  Laterality: N/A;  . stress cardiolite  05/30/2003    Family History  Problem Relation Age of Onset  . Cancer Mother        Breast Cancer  . Cancer Father        uncertain type  . Diabetes Father   . Cancer Brother 74       Colon Cancer  . Colon cancer Neg Hx   . Rectal cancer Neg Hx   . Stomach cancer Neg Hx     Social History   Socioeconomic History  . Marital status: Single    Spouse name: Not on file  . Number of children: 2  . Years of education: 10  . Highest education level: Not on file  Occupational History  . Occupation: roofer  Tobacco Use  . Smoking status: Former Smoker    Quit date: 07/26/1988    Years since quitting: 31.1  . Smokeless tobacco: Never Used  Substance and Sexual Activity  . Alcohol use: No  . Drug use: No  . Sexual activity: Not on file  Other Topics Concern  . Not on file  Social History Narrative   Lives with daughter in a one story home.  Has 2 children.  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  Education: 10th grade.    Social Determinants of Health   Financial Resource Strain:   . Difficulty of Paying Living Expenses: Not on file  Food Insecurity:   . Worried About Charity fundraiser in the Last Year: Not on file  . Ran Out of Food in the Last Year: Not on file  Transportation Needs:   . Lack of Transportation (Medical): Not on file  . Lack of Transportation (Non-Medical): Not on file  Physical Activity:   . Days of Exercise per Week: Not on file  . Minutes of Exercise per Session: Not on file  Stress:   . Feeling of Stress : Not on file  Social Connections:   . Frequency of Communication  with Friends and Family: Not on file  . Frequency of Social Gatherings with Friends and Family: Not on file  . Attends Religious Services: Not on file  . Active Member of Clubs or Organizations: Not on file  . Attends Archivist Meetings: Not on file  . Marital Status: Not on file  Intimate Partner Violence:   . Fear of Current or Ex-Partner: Not on file  . Emotionally Abused: Not on file  . Physically Abused: Not on file  . Sexually Abused: Not on file    Outpatient Medications Prior to Visit  Medication Sig Dispense Refill  . aspirin EC 81 MG tablet Take 1 tablet (81 mg total) by mouth daily.    [name] gabapentin (NEURONTIN) 100 MG capsule Take 1 capsule (100 mg total) by mouth 3 (three) times daily. 90 capsule 1  . glucose blood test strip USE TO CHECK BLOOD SUGAR TWICE DAILY 100 each 12  . insulin NPH Human (NOVOLIN N RELION) 100 UNIT/ML injection Inject 1.1 mLs (110 Units total) into the skin daily before breakfast. And syringes 1/day 40 mL 11  . olmesartan (BENICAR) 20 MG tablet TAKE 1 TABLET(20 MG) BY MOUTH DAILY 90 tablet 0  . atorvastatin (LIPITOR) 20 MG tablet Take 1 tablet (20 mg total) by mouth every evening. 90 tablet 2  . metoprolol succinate (TOPROL-XL) 25 MG 24 hr tablet TAKE 1 TABLET(25 MG) BY MOUTH DAILY 90 tablet 0  . docusate sodium (COLACE) 100 MG capsule Take 1 capsule (100 mg total) by mouth 2 (two) times daily. (Patient not taking: Reported on 07/23/2019) 10 capsule 0   No facility-administered medications prior to visit.    No Known Allergies  ROS Review of Systems  Constitutional: Negative.   Respiratory: Negative.   Cardiovascular: Negative.   Endocrine: Negative for polyphagia and polyuria.  Genitourinary: Negative.   Neurological: Negative for weakness and headaches.  Psychiatric/Behavioral: Negative.       Objective:    Physical Exam  Constitutional: He is oriented to person, place, and time. He appears well-developed and well-nourished.  No distress.  HENT:  Head: Normocephalic and atraumatic.  Right Ear: External ear normal.  Left Ear: External ear normal.  Eyes: Conjunctivae are normal. Right eye exhibits no discharge. Left eye exhibits no discharge. No scleral icterus.  Neck: No JVD present. No tracheal deviation present.  Pulmonary/Chest: Effort normal. No stridor.  Neurological: He is alert and oriented to person, place, and time.  Skin: He is not diaphoretic.  Psychiatric: He has a normal mood and affect. His behavior is  normal.    Ht 6\' 1"  (1.854 m)   BMI 27.05 kg/m  Wt Readings from Last 3 Encounters:  07/23/19 205 lb (93 kg)  02/19/19 202 lb (91.6 kg)  02/14/19 202 lb (91.6 kg)     Health Maintenance Due  Topic Date Due  . OPHTHALMOLOGY EXAM  07/18/2019    There are no preventive care reminders to display for this patient.  Lab Results  Component Value Date   TSH 1.18 08/04/2016   Lab Results  Component Value Date   WBC 6.3 07/23/2019   HGB 13.9 07/23/2019   HCT 40.8 07/23/2019   MCV 85.1 07/23/2019   PLT 181.0 07/23/2019   Lab Results  Component Value Date   NA 137 07/23/2019   K 4.4 07/23/2019   CO2 24 07/23/2019   GLUCOSE 212 (H) 07/23/2019   BUN 25 (H) 07/23/2019   CREATININE 0.99 07/23/2019   BILITOT 0.4 07/23/2019   ALKPHOS 68 07/23/2019   AST 14 07/23/2019   ALT 15 07/23/2019   PROT 7.2 07/23/2019   ALBUMIN 4.3 07/23/2019   CALCIUM 9.6 07/23/2019   GFR 74.91 07/23/2019   Lab Results  Component Value Date   CHOL 198 07/23/2019   Lab Results  Component Value Date   HDL 42.60 07/23/2019   Lab Results  Component Value Date   LDLCALC 117 (H) 07/23/2019   Lab Results  Component Value Date   TRIG 191.0 (H) 07/23/2019   Lab Results  Component Value Date   CHOLHDL 5 07/23/2019   Lab Results  Component Value Date   HGBA1C 10.4 (H) 07/23/2019      Assessment & Plan:   Problem List Items Addressed This Visit      Cardiovascular and Mediastinum   Essential  hypertension   Relevant Medications   atorvastatin (LIPITOR) 40 MG tablet   metoprolol succinate (TOPROL-XL) 50 MG 24 hr tablet   Coronary artery disease involving native heart   Relevant Medications   atorvastatin (LIPITOR) 40 MG tablet   metoprolol succinate (TOPROL-XL) 50 MG 24 hr tablet     Endocrine   Type 2 diabetes mellitus with hyperglycemia, with long-term current use of insulin (HCC) - Primary   Relevant Medications   atorvastatin (LIPITOR) 40 MG tablet     Other   HYPERCHOLESTEROLEMIA   Relevant Medications   atorvastatin (LIPITOR) 40 MG tablet   metoprolol succinate (TOPROL-XL) 50 MG 24 hr tablet   Abnormal PSA      Meds ordered this encounter  Medications  . atorvastatin (LIPITOR) 40 MG tablet    Sig: Take 1 tablet (40 mg total) by mouth daily.    Dispense:  90 tablet    Refill:  3  . metoprolol succinate (TOPROL-XL) 50 MG 24 hr tablet    Sig: Take 1 tablet (50 mg total) by mouth daily. Take with or immediately following a meal.    Dispense:  90 tablet    Refill:  3    Follow-up: No follow-ups on file.  Have increased atorvastatin to 40 mg and increase metoprolol to 50 mg patient agrees to go for endocrinology and urology consultations.  Virtual Visit via Video Note  I connected with Scott Donaldson on 08/29/19 at  8:30 AM EST by a video enabled telemedicine application and verified that I am speaking with the correct person using two identifiers.  Location: Patient:home w daughter  Provider:    I discussed the limitations of evaluation and management by telemedicine and the availability  of in person appointments. The patient expressed understanding and agreed to proceed.  History of Present Illness:    Observations/Objective:   Assessment and Plan:   Follow Up Instructions:    I discussed the assessment and treatment plan with the patient. The patient was provided an opportunity to ask questions and all were answered. The patient agreed  with the plan and demonstrated an understanding of the instructions.   The patient was advised to call back or seek an in-person evaluation if the symptoms worsen or if the condition fails to improve as anticipated.  I provided 35 minutes of non-face-to-face time during this encounter.   [name], MD   [name], MD

## 2019-09-04 ENCOUNTER — Other Ambulatory Visit: Payer: Self-pay

## 2019-09-04 NOTE — Progress Notes (Signed)
Name: Scott Donaldson  MRN/ DOB: [id number], [birth year]   Age/ Sex: 69 y.o., male    PCP: [name], MD   Reason for Endocrinology Evaluation: Type 2 Diabetes Mellitus     Date of Initial Endocrinology Visit: 05/01/2018    PATIENT IDENTIFIER: [name] is a 69 y.o. male with a past medical history of T2Dm and HTN. The patient presented for initial endocrinology clinic visit on 09/05/2019 for consultative assistance with his diabetes management.    HPI: Scott Donaldson was    Diagnosed with DM in 1990 Prior Medications tried/Intolerance: He was on oral glycemic agents until 2010, when he lost his job and lost his insurance and had to switch to the [employer] insulin, he has been on insulin since then.   Currently checking blood sugars 2-3 x / day,  before breakfast Hypoglycemia episodes : yes            Symptoms: feels sick  And shaky             Frequency: daily  Hemoglobin A1c has ranged from 8.3% in 2016, peaking at 12.2% in 2019. Patient required assistance for hypoglycemia: no Patient has required hospitalization within the last 1 year from hyper or hypoglycemia:  no  In terms of diet, the patient eats 2-3 meals a day , eats crackers during the day  He is a roofer with fear of hypoglycemia    HOME DIABETES REGIMEN: ReliOn Mix 110 units daily -has been taking it at bedtime and takes 60 units  Took 55 last night  Has the 0.5 ml syringes    Statin: yes ACE-I/ARB: Yes Prior Diabetic Education: yes   METER DOWNLOAD SUMMARY: Did not bring    DIABETIC COMPLICATIONS: Microvascular complications:   Neuropathy  Denies: CKD, retinopathy  Last eye exam: Completed  2019  Macrovascular complications:   CAD (S/P PCI)   Denies: PVD, CVA   PAST HISTORY: Past Medical History:  Past Medical History:  Diagnosis Date  . Abdominal pain 08/04/2016  . Abnormal PSA 05/19/2015  . Arthralgia 11/12/2010  . Benign prostatic hyperplasia with urinary  hesitancy 06/14/2018  . Cataract   . CELLULITIS, FOOT, RIGHT 07/02/2008   Qualifier: Diagnosis of  By: Marca Ancona RMA, Lucy    . Cough 08/04/2016  . Diabetes ([place]) 03/04/2007   Qualifier: Diagnosis of  By: Loanne Drilling MD, Jacelyn Pi   . DIABETES MELLITUS, TYPE II 03/04/2007  . Edema 07/02/2008   Qualifier: Diagnosis of  By: Marca Ancona RMA, Lucy    . Elevated LDL cholesterol level 06/14/2018  . Foot ulcer, right ([place]) 04/15/2014  . GERD (gastroesophageal reflux disease)   . HYPERCHOLESTEROLEMIA 07/29/2008  . HYPERTENSION 03/04/2007  . Knee pain, left 05/16/2015  . Loss of vision 04/15/2014  . Nonintractable episodic headache 12/01/2016  . Psoriasis   . SHOULDER PAIN, BILATERAL 01/12/2010   Qualifier: Diagnosis of  By: Loanne Drilling MD, Jacelyn Pi   . Swelling of left foot 08/05/2017  . 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 COLONOSCOPY    . LEFT HEART CATH AND CORONARY ANGIOGRAPHY N/A 07/12/2018   Procedure: LEFT HEART CATH AND CORONARY ANGIOGRAPHY;  Surgeon: Wellington Hampshire, MD;  Location: [facility];  Service: Cardiovascular;  Laterality: N/A;  . stress cardiolite  05/30/2003      Social History:  reports that he  quit smoking about 31 years ago. He has never used smokeless tobacco. He reports that he does not drink alcohol or use drugs. Family History:  Family History  Problem Relation Age of Onset  . Cancer Mother        Breast Cancer  . Cancer Father        uncertain type  . Diabetes Father   . Cancer Brother 8       Colon Cancer  . Colon cancer Neg Hx   . Rectal cancer Neg Hx   . Stomach cancer Neg Hx      HOME MEDICATIONS: Allergies as of 09/05/2019   No Known Allergies     Medication List       Accurate as of September 05, 2019  8:37 AM. If you have any questions, ask your nurse or doctor.        STOP taking these medications   insulin NPH Human 100  UNIT/ML injection Commonly known as: NovoLIN N ReliOn Stopped by: Dorita Sciara, MD     TAKE these medications   aspirin EC 81 MG tablet Take 1 tablet (81 mg total) by mouth daily.   atorvastatin 40 MG tablet Commonly known as: LIPITOR Take 1 tablet (40 mg total) by mouth daily.   docusate sodium 100 MG capsule Commonly known as: Colace Take 1 capsule (100 mg total) by mouth 2 (two) times daily.   gabapentin 100 MG capsule Commonly known as: NEURONTIN Take 1 capsule (100 mg total) by mouth 3 (three) times daily.   glucose blood test strip USE TO CHECK BLOOD SUGAR TWICE DAILY   insulin NPH-regular Human (70-30) 100 UNIT/ML injection Inject 45 Units into the skin 2 (two) times daily with a meal. Started by: Dorita Sciara, MD   metFORMIN 500 MG 24 hr tablet Commonly known as: GLUCOPHAGE-XR Take 1 tablet (500 mg total) by mouth 2 (two) times daily. Started by: Dorita Sciara, MD   metoprolol succinate 50 MG 24 hr tablet Commonly known as: TOPROL-XL Take 1 tablet (50 mg total) by mouth daily. Take with or immediately following a meal.   olmesartan 20 MG tablet Commonly known as: BENICAR TAKE 1 TABLET(20 MG) BY MOUTH DAILY        ALLERGIES: No Known Allergies   REVIEW OF SYSTEMS: A comprehensive ROS was conducted with the patient and is negative except as per HPI and below:  Review of Systems  Gastrointestinal: Negative for diarrhea and nausea.  Neurological: Negative for tingling and tremors.      OBJECTIVE:   VITAL SIGNS: BP (!) 142/76 (BP Location: Left Arm, Patient Position: Sitting, Cuff Size: Large)   Pulse 60   Temp 98.6 F (37 C)   Ht 6\' 1"  (1.854 m)   Wt 202 lb 6.4 oz (91.8 kg)   SpO2 98%   BMI 26.70 kg/m    PHYSICAL EXAM:  General: Pt appears well and is in NAD  HEENT:  Eyes: External eye exam normal without stare, lid lag or exophthalmos.  EOM intact.    Neck: General: Supple without adenopathy or carotid bruits.  Thyroid: Thyroid size normal.  No goiter or nodules appreciated. No thyroid bruit.  Lungs: Clear with good BS bilat with no rales, rhonchi, or wheezes  Heart: RRR with normal S1 and S2 and no gallops; no murmurs; no rub  Abdomen: Normoactive bowel sounds, soft, nontender, without masses or organomegaly palpable  Extremities:  Lower extremities - No pretibial edema. No lesions.  Skin:  Normal texture and temperature to palpation. No rash noted. No Acanthosis nigricans/skin tags. No lipohypertrophy.  Neuro: MS is good with appropriate affect, pt is alert and Ox3    DM foot exam: Deferred   DATA REVIEWED:  Lab Results  Component Value Date   HGBA1C 10.4 (H) 07/23/2019   HGBA1C 9.2 (A) 12/01/2018   HGBA1C 9.1 (A) 09/07/2018   Lab Results  Component Value Date   MICROALBUR 37.8 (H) 07/23/2019   LDLCALC 117 (H) 07/23/2019   CREATININE 0.99 07/23/2019   Lab Results  Component Value Date   MICRALBCREAT 24.0 07/23/2019    Lab Results  Component Value Date   CHOL 198 07/23/2019   HDL 42.60 07/23/2019   LDLCALC 117 (H) 07/23/2019   LDLDIRECT 96.0 07/23/2019   TRIG 191.0 (H) 07/23/2019   CHOLHDL 5 07/23/2019        ASSESSMENT / PLAN / RECOMMENDATIONS:   1) Type 2 Diabetes Mellitus, poorly controlled, With neuropathic and macrovascular complications - Most recent A1c of 10.4 %. Goal A1c < 7.0 %.     - I have discussed with the patient the pathophysiology of diabetes. We went over the natural progression of the disease. We talked about both insulin resistance and insulin deficiency. We stressed the importance of lifestyle changes including diet and exercise. I explained the complications associated with diabetes including retinopathy, nephropathy, neuropathy as well as increased risk of cardiovascular disease. We went over the benefit seen with glycemic control.   - I explained to the patient that diabetic patients are at higher than normal risk for amputations.  -The patient has  been on insulin since 2020, he prefers the vials, he had issues maneuvering the insulin pen.  Patient prefers to stay on the ReliOn insulin due to low cost.  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  -We discussed add-on therapy such as Metformin, patient used to be on it at some point without side effects, patient is in agreement of restarting this. -In the future as long as he continues to have normal GFR we will consider adding an SGLT2 inhibitors due to cardiovascular benefits. -His barriers to care are the cost of medications. -I have explained to him the importance of taking his insulin before a meal, I explained to him that taking it at bedtime or after a meal will cause hypoglycemia -   MEDICATIONS:  Metformin 500 mg XR twice daily-titration provided  ReliOn mix 70/30 at 45 units before breakfast and supper  EDUCATION / INSTRUCTIONS:  BG monitoring instructions: Patient is instructed to check his blood sugars 2 times a day, before breakfast and supper.  Call [facility] if: BG persistently < 70 or > 300. . I reviewed the Rule of 15 for the treatment of hypoglycemia in detail with the patient. Literature supplied.   2) Diabetic complications:   Eye: Does not have known diabetic retinopathy.   Neuro/ Feet: Does  have known diabetic peripheral neuropathy. Renal: Patient does not have known baseline CKD. He is on an ACEI/ARB at present.  3) Lipids: Patient is on atorvastatin 40 mg daily.  LDL above goal, no changes at this time we will continue to monitor   4) Hypertension: Blood pressure is at acceptable control at this time.   F/u in 3 months  Signed electronically by: Mack Guise, MD  [facility]  [facility] [address], [place], [place] [address] Phone: [phone] FAX: [phone]   CC: [name], MD [place] [address] Phone: [phone]  Fax: [phone]    Return to Endocrinology clinic as below: Future Appointments  Date Time Provider [place]  12/06/2019  7:30 AM Shamleffer, Melanie Crazier, MD [id number] None

## 2019-09-05 ENCOUNTER — Ambulatory Visit (INDEPENDENT_AMBULATORY_CARE_PROVIDER_SITE_OTHER): Payer: Medicare Other | Admitting: Internal Medicine

## 2019-09-05 ENCOUNTER — Encounter: Payer: Self-pay | Admitting: Internal Medicine

## 2019-09-05 VITALS — BP 142/76 | HR 60 | Temp 98.6°F | Ht 73.0 in | Wt 202.4 lb

## 2019-09-05 DIAGNOSIS — E1159 Type 2 diabetes mellitus with other circulatory complications: Secondary | ICD-10-CM | POA: Diagnosis not present

## 2019-09-05 DIAGNOSIS — E1165 Type 2 diabetes mellitus with hyperglycemia: Secondary | ICD-10-CM | POA: Diagnosis not present

## 2019-09-05 DIAGNOSIS — E119 Type 2 diabetes mellitus without complications: Secondary | ICD-10-CM | POA: Insufficient documentation

## 2019-09-05 DIAGNOSIS — Z794 Long term (current) use of insulin: Secondary | ICD-10-CM | POA: Diagnosis not present

## 2019-09-05 DIAGNOSIS — E1142 Type 2 diabetes mellitus with diabetic polyneuropathy: Secondary | ICD-10-CM | POA: Insufficient documentation

## 2019-09-05 LAB — GLUCOSE, POCT (MANUAL RESULT ENTRY): POC Glucose: 222 mg/dl — AB (ref 70–99)

## 2019-09-05 MED ORDER — METFORMIN HCL ER 500 MG PO TB24: 500.0000 mg | ORAL_TABLET | Freq: Two times a day (BID) | ORAL | 6 refills | Status: DC

## 2019-09-05 MED ORDER — INSULIN NPH ISOPHANE & REGULAR (70-30) 100 UNIT/ML ~~LOC~~ SUSP: 45.0000 [IU] | Freq: Two times a day (BID) | SUBCUTANEOUS | 11 refills | Status: DC

## 2019-09-05 NOTE — Patient Instructions (Signed)
-   Start Metformin 500 mg, ONE tablet with Breakfast for 2 weeks, if no stomach issues , please increase to one Tablet with Breakfast and one tablet with Supper   - ReliOn Mix (70/30) take 45 units before breakfast and Supper only    - You must eat lunch with this insulin, but DO NOT take insulin with lunch    - Try and take it 20 minutes before you eat  - Try and check sugar before Breakfast and supper  Choose healthy, lower carb lower calorie snacks: toss salad, cooked vegetables, cottage cheese, peanut butter, low fat cheese / string cheese, lower sodium deli meat, tuna salad or chicken salad    HOW TO TREAT LOW BLOOD SUGARS (Blood sugar LESS THAN 70 MG/DL)  Please follow the RULE OF 15 for the treatment of hypoglycemia treatment (when your (blood sugars are less than 70 mg/dL)    STEP 1: Take 15 grams of carbohydrates when your blood sugar is low, which includes:   3-4 GLUCOSE TABS  OR  3-4 OZ OF JUICE OR REGULAR SODA OR  ONE TUBE OF GLUCOSE GEL     STEP 2: RECHECK blood sugar in 15 MINUTES STEP 3: If your blood sugar is still low at the 15 minute recheck --> then, go back to STEP 1 and treat AGAIN with another 15 grams of carbohydrates.

## 2019-09-30 DIAGNOSIS — R05 Cough: Secondary | ICD-10-CM | POA: Diagnosis not present

## 2019-09-30 DIAGNOSIS — R438 Other disturbances of smell and taste: Secondary | ICD-10-CM | POA: Diagnosis not present

## 2019-10-10 ENCOUNTER — Encounter: Payer: Self-pay | Admitting: Internal Medicine

## 2019-10-11 MED ORDER — INSULIN NPH ISOPHANE & REGULAR (70-30) 100 UNIT/ML ~~LOC~~ SUSP: 36.0000 [IU] | Freq: Two times a day (BID) | SUBCUTANEOUS | 11 refills | Status: DC

## 2019-11-19 ENCOUNTER — Encounter: Payer: Self-pay | Admitting: Family Medicine

## 2019-11-19 DIAGNOSIS — R3915 Urgency of urination: Secondary | ICD-10-CM | POA: Diagnosis not present

## 2019-11-19 DIAGNOSIS — R800 Isolated proteinuria: Secondary | ICD-10-CM | POA: Diagnosis not present

## 2019-11-21 ENCOUNTER — Telehealth: Payer: Self-pay | Admitting: Family Medicine

## 2019-11-21 NOTE — Progress Notes (Signed)
  Chronic Care Management   Note  11/21/2019 Name: [name] MRN: [id number] DOB: Jan 01, 1951  Scott Donaldson is a 69 y.o. year old male who is a primary care patient of [name], MD. I reached out to Scott Donaldson by phone today in response to a referral sent by [name] PCP, [name], MD.   [name] was given information about Chronic Care Management services today including:  1. CCM service includes personalized support from designated clinical staff supervised by his physician, including individualized plan of care and coordination with other care providers 2. 24/7 contact phone numbers for assistance for urgent and routine care needs. 3. Service will only be billed when office clinical staff spend 20 minutes or more in a month to coordinate care. 4. Only one practitioner may furnish and bill the service in a calendar month. 5. The patient may stop CCM services at any time (effective at the end of the month) by phone call to the office staff.   Patient agreed to services and verbal consent obtained.   This note is not being shared with the patient for the following reason: To respect privacy (The patient or proxy has requested that the information not be shared).  Follow up plan:   [name]

## 2019-11-21 NOTE — Progress Notes (Signed)
  Chronic Care Management   Outreach Note  11/21/2019 Name: Scott Donaldson MRN: [id number] DOB: [birth year]  Referred by: [name], MD Reason for referral : No chief complaint on file.   An unsuccessful telephone outreach was attempted today. The patient was referred to the pharmacist for assistance with care management and care coordination.   This note is not being shared with the patient for the following reason: To respect privacy (The patient or proxy has requested that the information not be shared).  Follow Up Plan:   [name]

## 2019-11-23 ENCOUNTER — Ambulatory Visit: Payer: Medicare Other | Admitting: Family Medicine

## 2019-11-28 ENCOUNTER — Other Ambulatory Visit: Payer: Self-pay | Admitting: Family Medicine

## 2019-11-28 DIAGNOSIS — I10 Essential (primary) hypertension: Secondary | ICD-10-CM

## 2019-11-28 DIAGNOSIS — Z794 Long term (current) use of insulin: Secondary | ICD-10-CM

## 2019-11-30 NOTE — Chronic Care Management (AMB) (Deleted)
Chronic Care Management Pharmacy  Name: Scott Donaldson  MRN: [id number] DOB: 11-26-1950  Chief Complaint/ HPI  Scott Donaldson,  69 y.o. , male presents for their Initial CCM visit with the clinical pharmacist via telephone.  PCP : Scott Maw, MD  Their chronic conditions include: Hypertension, CAD, Type 2 diabetes, osteoarthritis, hypercholesterolemia   Office Visits: 08/29/19: patient presented to Dr. Ethelene Donaldson for DM follow-up. Atorvastatin increased to 40 mg daily, metoprolol increased to 50 mg daily.  07/22/20: Patient presented to Dr. Ethelene Donaldson for DM follow-up. LDL elevated. A1c worsened to 10.4%  Consult Visit: 11/19/19: Patient reports presenting to urologist (not available in EMR). Patient reports proteinuria. Patient started on tamsulosin 0.4 mg daily 10/10/19: Patient called Dr. Kelton Donaldson due to concerns of hypogylcemia. Reported BG ranging 57-224. Insulin decreased to 36 units BID.  09/05/19: Patient presented to Dr. Kelton Donaldson (Endo) for T2DM follow-up. Patient NPH reduced to 45 units BID, patient started on metformin ER 500 mg BID.   Medications: Outpatient Encounter Medications as of 12/03/2019  Medication Sig  . aspirin EC 81 MG tablet Take 1 tablet (81 mg total) by mouth daily.  [name] atorvastatin (LIPITOR) 40 MG tablet Take 1 tablet (40 mg total) by mouth daily.  [name] docusate sodium (COLACE) 100 MG capsule Take 1 capsule (100 mg total) by mouth 2 (two) times daily. (Patient not taking: Reported on 07/23/2019)  . gabapentin (NEURONTIN) 100 MG capsule Take 1 capsule (100 mg total) by mouth 3 (three) times daily. (Patient not taking: Reported on 09/05/2019)  . glucose blood test strip USE TO CHECK BLOOD SUGAR TWICE DAILY  . insulin NPH-regular Human (70-30) 100 UNIT/ML injection Inject 36 Units into the skin 2 (two) times daily with a meal.  . metFORMIN (GLUCOPHAGE-XR) 500 MG 24 hr tablet Take 1 tablet (500 mg total) by mouth 2 (two) times daily.  . metoprolol  succinate (TOPROL-XL) 50 MG 24 hr tablet Take 1 tablet (50 mg total) by mouth daily. Take with or immediately following a meal.  . olmesartan (BENICAR) 20 MG tablet TAKE 1 TABLET(20 MG) BY MOUTH DAILY   No facility-administered encounter medications on file as of 12/03/2019.     Current Diagnosis/Assessment:  Goals Addressed   None     Diabetes   Recent Relevant Labs: Lab Results  Component Value Date/Time   HGBA1C 10.4 (H) 07/23/2019 08:59 AM   HGBA1C 9.2 (A) 12/01/2018 08:59 AM   HGBA1C 9.1 (A) 09/07/2018 08:21 AM   HGBA1C 10.7 (H) 04/15/2014 09:41 AM   MICROALBUR 37.8 (H) 07/23/2019 08:59 AM   MICROALBUR 12.5 (H) 08/21/2018 08:27 AM     Checking BG: {CHL HP Blood Glucose Monitoring Frequency:[phone]}  Recent FBG Readings: Recent pre-meal BG readings: *** Recent 2hr PP BG readings:  *** Recent HS BG readings: ***  Patient has failed these meds in past: *** Patient is currently {CHL Controlled/[phone]} on the following medications: ***  Metformin XR 500 mg BID   NPH 70/30 36 units BID   Last diabetic Foot exam: No results found for: HMDIABEYEEXA  Last diabetic Eye exam: No results found for: HMDIABFOOTEX   We discussed: {CHL HP Upstream Pharmacy discussion:[phone]}  Plan  Continue {CHL HP Upstream Pharmacy Plans:[phone]} and  Hypertension   BP today is:  {CHL HP UPSTREAM Pharmacist BP [phone]}  Office blood pressures are  BP Readings from Last 3 Encounters:  09/05/19 (!) 142/76  07/23/19 (!) 154/72  02/19/19 (!) 167/63   CMP Latest Ref Rng & Units 07/23/2019  02/12/2019 09/11/2018  Glucose 70 - 99 mg/dL 212(H) 422(H) -  BUN 6 - 23 mg/dL 25(H) 67(H) -  Creatinine 0.40 - 1.50 mg/dL 0.99 1.43 -  Sodium 135 - 145 mEq/L 137 136 -  Potassium 3.5 - 5.1 mEq/L 4.4 4.7 -  Chloride 96 - 112 mEq/L 105 105 -  CO2 19 - 32 mEq/L 24 21 -  Calcium 8.4 - 10.5 mg/dL 9.6 8.8 -  Total Protein 6.0 - 8.3 g/dL 7.2 7.3 7.3  Total Bilirubin 0.2  - 1.2 mg/dL 0.4 0.4 0.3  Alkaline Phos 39 - 117 U/L 68 69 79  AST 0 - 37 U/L 14 16 15   ALT 0 - 53 U/L 15 14 13    Patient has failed these meds in the past: *** Patient is currently {CHL Controlled/Uncontrolled:[phone]} on the following medications: ***  Olmesartan 20 mg daily  Metoprolol XL 50 mg daily   Patient checks BP at home {CHL HP BP Monitoring [phone]}  Patient home BP readings are ranging: ***  We discussed {CHL HP Upstream Pharmacy discussion:[id number]}  Plan  Continue {CHL HP Upstream Pharmacy Plans:[phone]}   Hyperlipidemia   07/12/18: L.Cath revealed Mild-moderate nonobstructive coronary artery disease.   Lipid Panel     Component Value Date/Time   CHOL 198 07/23/2019 0859   TRIG 191.0 (H) 07/23/2019 0859   HDL 42.60 07/23/2019 0859   CHOLHDL 5 07/23/2019 0859   VLDL 38.2 07/23/2019 0859   LDLCALC 117 (H) 07/23/2019 0859   LDLDIRECT 96.0 07/23/2019 0859     The 10-year ASCVD risk score Scott Donaldson DC Jr., et al., 2013) is: 42%   Values used to calculate the score:     Age: 50 years     Sex: Male     Is Non-Hispanic African American: No     Diabetic: Yes     Tobacco smoker: No     Systolic Blood Pressure: [id number] mmHg     Is BP treated: Yes     HDL Cholesterol: 42.6 mg/dL     Total Cholesterol: 198 mg/dL   Patient has failed these meds in past: *** Patient is currently {CHL Controlled/Uncontrolled:[phone]} on the following medications: ***  Aspirin 81 mg daily   Atorvastatin 40 mg daily  We discussed:  {CHL HP Upstream Pharmacy discussion:[id number]}  Plan  Continue {CHL HP Upstream Pharmacy [id number] Recommend follow-up Lipid Panel  BPH   Dr. Ellison Donaldson  PSA  Date Value Ref Range Status  07/23/2019 11.88 (H) 0.10 - 4.00 ng/mL Final    Comment:    Test performed using Access Hybritech PSA Assay, a parmagnetic partical, chemiluminecent immunoassay.  02/12/2019 9.45 (H) 0.10 - 4.00 ng/mL Final    Comment:     Test performed using Access Hybritech PSA Assay, a parmagnetic partical, chemiluminecent immunoassay.  08/21/2018 6.20 (H) 0.10 - 4.00 ng/mL Final    Comment:    Test performed using Access Hybritech PSA Assay, a parmagnetic partical, chemiluminecent immunoassay.    Patient has failed these meds in past: n/a Patient is currently uncontrolled on the following medications:   Tamsulosin 0.4 mg daily   We discussed:  ***  Plan  Continue {CHL HP Upstream Pharmacy Plans:[phone]}   Misc/OTC    Docusate 100 mg BID  Gabapentin 100 mg TID    We discussed:  ***  Plan  Continue {CHL HP Upstream Pharmacy [id number]

## 2019-11-30 NOTE — Addendum Note (Signed)
Addended by: Lynda Rainwater on: 11/30/2019 01:47 PM   Modules accepted: Orders

## 2019-12-03 ENCOUNTER — Telehealth: Payer: Medicare Other

## 2019-12-03 ENCOUNTER — Ambulatory Visit: Payer: Medicare Other

## 2019-12-03 NOTE — Patient Instructions (Signed)
Visit Information It was great speaking with you today!  Please let me know if you have any questions about our visit.  Goals Addressed            This Visit's Progress   . Chronic Care Management       CARE PLAN ENTRY  Current Barriers:  . Chronic Disease Management support, education, and care coordination needs related to Hypertension, Hyperlipidemia, Diabetes, Coronary Artery Disease, and Osteoarthritis   Hypertension . Pharmacist Clinical Goal(s): o Over the next 30 days, patient will work with PharmD and providers to achieve BP goal <140/90 . Current regimen:  o Olmesartan 20 mg  o Metoprolol 50 mg  . Interventions: o Recommend purchasing blood pressure monitor. Omron is a reliable brand available at most pharmacies.  . Patient self care activities - Over the next 30 days, patient will: o Check blood pressure daily, document, and provide at future appointments o Ensure daily salt intake < 2300 mg/day  Hyperlipidemia . Pharmacist Clinical Goal(s): o Over the next 30 days, patient will work with PharmD and providers to achieve LDL goal less than 100  . Current regimen:  o Atorvastatin 40 mg   Diabetes . Pharmacist Clinical Goal(s): o Over the next 90 days, patient will work with PharmD and providers to achieve A1c goal <7% . Current regimen:  o Metformin 500 mg twice daily o Insulin NPH-Regular 36 units twice daily . Patient self care activities - Over the next 30 days, patient will: o Check blood sugar twice daily, document, and provide at future appointments o Contact provider with any episodes of hypoglycemia  Osteoarthritis . Pharmacist Clinical Goal(s) o Over the next 30 days, patient will work with PharmD and providers to improve arthritis pain . Current regimen:  o Gabapentin 100 mg as needed . Interventions: o Recommend starting Tylenol Arthritis Strength 650 mg twice daily   Medication management . Pharmacist Clinical Goal(s): o Over the next 90 days,  patient will work with PharmD and providers to achieve optimal medication adherence . Current pharmacy: [facility] . Interventions o Comprehensive medication review performed. o Continue current medication management strategy . Patient self care activities - Over the next 90 days, patient will: o Take medications as prescribed o Report any questions or concerns to PharmD and/or provider(s)        Scott Donaldson was given information about Chronic Care Management services today including:  1. CCM service includes personalized support from designated clinical staff supervised by his physician, including individualized plan of care and coordination with other care providers 2. 24/7 contact phone numbers for assistance for urgent and routine care needs. 3. Standard insurance, coinsurance, copays and deductibles apply for chronic care management only during months in which we provide at least 20 minutes of these services. Most insurances cover these services at 100%, however patients may be responsible for any copay, coinsurance and/or deductible if applicable. This service may help you avoid the need for more expensive face-to-face services. 4. Only one practitioner may furnish and bill the service in a calendar month. 5. The patient may stop CCM services at any time (effective at the end of the month) by phone call to the office staff.  Patient agreed to services and verbal consent obtained.   The patient verbalized understanding of instructions provided today and agreed to receive a mailed copy of patient instruction and/or educational materials. Telephone follow up appointment with pharmacy team member scheduled for: 12/25/19 at 12:30 PM  [place]  Pharmacist Financial controller at [employer]  [phone]

## 2019-12-03 NOTE — Chronic Care Management (AMB) (Signed)
Chronic Care Management Pharmacy  Name: Scott Donaldson  MRN: [id number] DOB: 26-Dec-1950  Chief Complaint/ HPI  Scott Donaldson,  69 y.o. , male presents for their Initial CCM visit with the clinical pharmacist via telephone. Today's visit took place with [name], patient's daughter and caregiver. She handles all of his medications, cooking, and provides transportation to his appointments.   PCP : [name], MD  Their chronic conditions include: Hypertension, CAD, Type 2 diabetes, osteoarthritis, hypercholesterolemia   Office Visits: 08/29/19: patient presented to Dr. Ethelene Hal for DM follow-up. Atorvastatin increased to 40 mg daily, metoprolol increased to 50 mg daily.  07/22/20: Patient presented to Dr. Ethelene Hal for DM follow-up. LDL elevated. A1c worsened to 10.4%  Consult Visit: 11/19/19: Patient reports presenting to urologist (not available in EMR). Patient reports proteinuria. Patient started on tamsulosin 0.4 mg daily 10/10/19: Patient called Dr. Kelton Pillar due to concerns of hypogylcemia. Reported BG ranging 57-224. Insulin decreased to 36 units BID.  09/05/19: Patient presented to Dr. Kelton Pillar (Endo) for T2DM follow-up. Patient NPH reduced to 45 units BID, patient started on metformin ER 500 mg BID.   Medications: Outpatient Encounter Medications as of 12/03/2019  Medication Sig  . aspirin EC 81 MG tablet Take 1 tablet (81 mg total) by mouth daily.  [name] atorvastatin (LIPITOR) 40 MG tablet Take 1 tablet (40 mg total) by mouth daily.  [name] gabapentin (NEURONTIN) 100 MG capsule Take 1 capsule (100 mg total) by mouth 3 (three) times daily. (Patient taking differently: Take 100 mg by mouth daily as needed. )  . insulin NPH-regular Human (70-30) 100 UNIT/ML injection Inject 36 Units into the skin 2 (two) times daily with a meal.  . metFORMIN (GLUCOPHAGE-XR) 500 MG 24 hr tablet Take 1 tablet (500 mg total) by mouth 2 (two) times daily.  . metoprolol succinate (TOPROL-XL)  50 MG 24 hr tablet Take 1 tablet (50 mg total) by mouth daily. Take with or immediately following a meal.  . olmesartan (BENICAR) 20 MG tablet TAKE 1 TABLET(20 MG) BY MOUTH DAILY  . tamsulosin (FLOMAX) 0.4 MG CAPS capsule Take 0.4 mg by mouth daily.  [name] docusate sodium (COLACE) 100 MG capsule Take 1 capsule (100 mg total) by mouth 2 (two) times daily. (Patient not taking: Reported on 07/23/2019)  . glucose blood test strip USE TO CHECK BLOOD SUGAR TWICE DAILY   No facility-administered encounter medications on file as of 12/03/2019.   Current Diagnosis/Assessment:  Goals Addressed            This Visit's Progress   . Chronic Care Management       CARE PLAN ENTRY  Current Barriers:  . Chronic Disease Management support, education, and care coordination needs related to Hypertension, Hyperlipidemia, Diabetes, Coronary Artery Disease, and Osteoarthritis   Hypertension . Pharmacist Clinical Goal(s): o Over the next 30 days, patient will work with PharmD and providers to achieve BP goal <140/90 . Current regimen:  o Olmesartan 20 mg  o Metoprolol 50 mg  . Interventions: o Recommend purchasing blood pressure monitor. Omron is a reliable brand available at most pharmacies.  . Patient self care activities - Over the next 30 days, patient will: o Check blood pressure daily, document, and provide at future appointments o Ensure daily salt intake < 2300 mg/day  Hyperlipidemia . Pharmacist Clinical Goal(s): o Over the next 30 days, patient will work with PharmD and providers to achieve LDL goal less than 100  . Current regimen:  o Atorvastatin 40  mg   Diabetes . Pharmacist Clinical Goal(s): o Over the next 90 days, patient will work with PharmD and providers to achieve A1c goal <7% . Current regimen:  o Metformin 500 mg twice daily o Insulin NPH-Regular 36 units twice daily . Patient self care activities - Over the next 30 days, patient will: o Check blood sugar twice daily,  document, and provide at future appointments o Contact provider with any episodes of hypoglycemia  Osteoarthritis . Pharmacist Clinical Goal(s) o Over the next 30 days, patient will work with PharmD and providers to improve arthritis pain . Current regimen:  o Gabapentin 100 mg as needed . Interventions: o Recommend starting Tylenol Arthritis Strength 650 mg twice daily   Medication management . Pharmacist Clinical Goal(s): o Over the next 90 days, patient will work with PharmD and providers to achieve optimal medication adherence . Current pharmacy: [facility] . Interventions o Comprehensive medication review performed. o Continue current medication management strategy . Patient self care activities - Over the next 90 days, patient will: o Take medications as prescribed o Report any questions or concerns to PharmD and/or provider(s)        Diabetes   Recent Relevant Labs: Lab Results  Component Value Date/Time   HGBA1C 10.4 (H) 07/23/2019 08:59 AM   HGBA1C 9.2 (A) 12/01/2018 08:59 AM   HGBA1C 9.1 (A) 09/07/2018 08:21 AM   HGBA1C 10.7 (H) 04/15/2014 09:41 AM   MICROALBUR 37.8 (H) 07/23/2019 08:59 AM   MICROALBUR 12.5 (H) 08/21/2018 08:27 AM    Checking BG: Daily   AM Pre-Lunch  10-May 226   9-May 294   8-May 279   6-May  173  2-May 249   1-May 178   26-Apr 298   Average 254    Patient has failed these meds in past: n/a Patient is currently uncontrolled on the following medications:   Metformin XR 500 mg BID (AM, EM)   NPH 70/30 36 units BID   Last diabetic Foot exam: No results found for: HMDIABEYEEXA  Last diabetic Eye exam: No results found for: HMDIABFOOTEX   We discussed: diet and exercise extensively. Not following low carb diet, eats mainly what he wants minimal activity. Still works part time. Drinks mainly water powerade or soda. Denies   Plan  Recommend A1c  Recommend increasing metformin to 1000 mg BID  Hypertension   BP today is:  n/a  Office blood pressures are  BP Readings from Last 3 Encounters:  09/05/19 (!) 142/76  07/23/19 (!) 154/72  02/19/19 (!) 167/63   CMP Latest Ref Rng & Units 07/23/2019 02/12/2019 09/11/2018  Glucose 70 - 99 mg/dL 212(H) 422(H) -  BUN 6 - 23 mg/dL 25(H) 67(H) -  Creatinine 0.40 - 1.50 mg/dL 0.99 1.43 -  Sodium 135 - 145 mEq/L 137 136 -  Potassium 3.5 - 5.1 mEq/L 4.4 4.7 -  Chloride 96 - 112 mEq/L 105 105 -  CO2 19 - 32 mEq/L 24 21 -  Calcium 8.4 - 10.5 mg/dL 9.6 8.8 -  Total Protein 6.0 - 8.3 g/dL 7.2 7.3 7.3  Total Bilirubin 0.2 - 1.2 mg/dL 0.4 0.4 0.3  Alkaline Phos 39 - 117 U/L 68 69 79  AST 0 - 37 U/L 14 16 15   ALT 0 - 53 U/L 15 14 13    Patient has failed these meds in the past: lisinopril, HCTZ,  Patient is currently uncontrolled on the following medications:   Olmesartan 20 mg daily (HS)   Metoprolol XL 50  mg daily  (HS) Patient checks BP at home never. Patient does not have BP cuff.   Patient home BP readings are ranging: n/a  We discussed diet and exercise extensively  Plan  Continue current medications  Increase blood pressure monitoring to daily for 3 weeks.   Hyperlipidemia   07/12/18: L.Cath revealed Mild-moderate nonobstructive coronary artery disease.   Lipid Panel     Component Value Date/Time   CHOL 198 07/23/2019 0859   TRIG 191.0 (H) 07/23/2019 0859   HDL 42.60 07/23/2019 0859   CHOLHDL 5 07/23/2019 0859   VLDL 38.2 07/23/2019 0859   LDLCALC 117 (H) 07/23/2019 0859   LDLDIRECT 96.0 07/23/2019 0859     The 10-year ASCVD risk score Mikey Bussing DC Jr., et al., 2013) is: 42%   Values used to calculate the score:     Age: 43 years     Sex: Male     Is Non-Hispanic African American: No     Diabetic: Yes     Tobacco smoker: No     Systolic Blood Pressure: A999333 mmHg     Is BP treated: Yes     HDL Cholesterol: 42.6 mg/dL     Total Cholesterol: 198 mg/dL   Patient has failed these meds in past: n/a Patient is currently controlled on the following  medications:   Aspirin 81 mg daily (HS)  Atorvastatin 40 mg daily (HS) We discussed:  diet and exercise extensively  Plan  Continue current medications Recommend follow-up Lipid Panel  BPH   Dr. Harrell Gave Lovena Neighbours  PSA  Date Value Ref Range Status  07/23/2019 11.88 (H) 0.10 - 4.00 ng/mL Final    Comment:    Test performed using Access Hybritech PSA Assay, a parmagnetic partical, chemiluminecent immunoassay.  02/12/2019 9.45 (H) 0.10 - 4.00 ng/mL Final    Comment:    Test performed using Access Hybritech PSA Assay, a parmagnetic partical, chemiluminecent immunoassay.  08/21/2018 6.20 (H) 0.10 - 4.00 ng/mL Final    Comment:    Test performed using Access Hybritech PSA Assay, a parmagnetic partical, chemiluminecent immunoassay.    Patient has failed these meds in past: n/a Patient is currently uncontrolled on the following medications:   Tamsulosin 0.4 mg daily (AM)   We discussed:  Urinary symptoms seem to have improved since starting tamsulosin.   Plan  Continue current medications  Osteoarthritis   Patient has failed these meds in past: n/a Patient is currently uncontrolled on the following medications:   Gabapentin 100 mg TID (takes as needed)  We discussed:  Knee pain fairly limiting. Gabapentin provides minimal relief.   Plan  Recommend Tylenol Arthritis strength 650 mg CR twice daily   Vaccines   Reviewed and discussed patient's vaccination history.    Immunization History  Administered Date(s) Administered  . Fluad Quad(high Dose 65+) 05/31/2019  . Influenza Split 05/17/2011  . Influenza Whole 05/15/2010  . Influenza, High Dose Seasonal PF 06/14/2018  . Influenza,inj,Quad PF,6+ Mos 06/12/2013  . Pneumococcal Conjugate-13 08/04/2016  . Pneumococcal Polysaccharide-23 05/16/2015  . Td 11/23/1997, 07/02/2008  . Tdap 09/02/2016    Plan  Recommended patient receive Covid-19 and Shingrix vaccine  Medication Management   Pt uses [facility] for  all medications. >5 day fill history gap noted for olmesartan Uses pill box? Yes  We discussed: n/a   Plan  Continue current medication management strategy  Follow up: 1 month phone visit  [place] at [facility]  [phone]

## 2019-12-04 ENCOUNTER — Other Ambulatory Visit: Payer: Self-pay

## 2019-12-05 ENCOUNTER — Encounter: Payer: Self-pay | Admitting: Family Medicine

## 2019-12-05 ENCOUNTER — Ambulatory Visit (INDEPENDENT_AMBULATORY_CARE_PROVIDER_SITE_OTHER): Payer: Medicare Other | Admitting: Family Medicine

## 2019-12-05 VITALS — BP 134/64 | HR 53 | Temp 96.8°F | Ht 73.0 in | Wt 201.6 lb

## 2019-12-05 DIAGNOSIS — R0989 Other specified symptoms and signs involving the circulatory and respiratory systems: Secondary | ICD-10-CM

## 2019-12-05 DIAGNOSIS — G629 Polyneuropathy, unspecified: Secondary | ICD-10-CM | POA: Diagnosis not present

## 2019-12-05 DIAGNOSIS — E1142 Type 2 diabetes mellitus with diabetic polyneuropathy: Secondary | ICD-10-CM

## 2019-12-05 DIAGNOSIS — Z794 Long term (current) use of insulin: Secondary | ICD-10-CM | POA: Diagnosis not present

## 2019-12-05 DIAGNOSIS — E7800 Pure hypercholesterolemia, unspecified: Secondary | ICD-10-CM | POA: Diagnosis not present

## 2019-12-05 DIAGNOSIS — I10 Essential (primary) hypertension: Secondary | ICD-10-CM | POA: Diagnosis not present

## 2019-12-05 LAB — COMPREHENSIVE METABOLIC PANEL
ALT: 14 U/L (ref 0–53)
AST: 15 U/L (ref 0–37)
Albumin: 4.3 g/dL (ref 3.5–5.2)
Alkaline Phosphatase: 69 U/L (ref 39–117)
BUN: 29 mg/dL — ABNORMAL HIGH (ref 6–23)
CO2: 26 mEq/L (ref 19–32)
Calcium: 10.1 mg/dL (ref 8.4–10.5)
Chloride: 103 mEq/L (ref 96–112)
Creatinine, Ser: 0.93 mg/dL (ref 0.40–1.50)
GFR: 80.43 mL/min (ref >60.00)
Glucose, Bld: 235 mg/dL — ABNORMAL HIGH (ref 70–99)
Potassium: 4.7 mEq/L (ref 3.5–5.1)
Sodium: 138 mEq/L (ref 135–145)
Total Bilirubin: 0.4 mg/dL (ref 0.2–1.2)
Total Protein: 7.4 g/dL (ref 6.0–8.3)

## 2019-12-05 LAB — CBC
HCT: 38 % — ABNORMAL LOW (ref 39.0–52.0)
Hemoglobin: 13.3 g/dL (ref 13.0–17.0)
MCHC: 34.9 g/dL (ref 30.0–36.0)
MCV: 85.2 fl (ref 78.0–100.0)
Platelets: 175 10*3/uL (ref 150.0–400.0)
RBC: 4.46 Mil/uL (ref 4.22–5.81)
RDW: 14.6 % (ref 11.5–15.5)
WBC: 5.6 10*3/uL (ref 4.0–10.5)

## 2019-12-05 LAB — URINALYSIS, ROUTINE W REFLEX MICROSCOPIC
Bilirubin Urine: NEGATIVE
Ketones, ur: NEGATIVE
Leukocytes,Ua: NEGATIVE
Nitrite: NEGATIVE
RBC / HPF: NONE SEEN (ref 0–?)
Specific Gravity, Urine: 1.025 (ref 1.000–1.030)
Total Protein, Urine: 100 — AB
Urine Glucose: 250 — AB
Urobilinogen, UA: 0.2 (ref 0.0–1.0)
pH: 5 (ref 5.0–8.0)

## 2019-12-05 LAB — MICROALBUMIN / CREATININE URINE RATIO
Creatinine,U: 128.9 mg/dL
Microalb Creat Ratio: 31.5 mg/g — ABNORMAL HIGH (ref 0.0–30.0)
Microalb, Ur: 40.5 mg/dL — ABNORMAL HIGH (ref 0.0–1.9)

## 2019-12-05 LAB — HEMOGLOBIN A1C: Hgb A1c MFr Bld: 9.3 % — ABNORMAL HIGH (ref 4.6–6.5)

## 2019-12-05 LAB — LDL CHOLESTEROL, DIRECT: Direct LDL: 79 mg/dL

## 2019-12-05 MED ORDER — GABAPENTIN 300 MG PO CAPS: ORAL_CAPSULE | ORAL | 3 refills | Status: DC

## 2019-12-05 NOTE — Progress Notes (Signed)
Established Patient Office Visit  Subjective:  Patient ID: Scott Donaldson, male    DOB: October 08, 1950  Age: 69 y.o. MRN: [id number]  CC:  Chief Complaint  Patient presents with  . Follow-up    follow up from visit with urologist protein in urine pt asked to f/u with [facility]    HPI Scott Donaldson presents for follow-up of his hypertension, elevated cholesterol, diabetes, neuropathy.  Blood pressure controlled today with metoprolol and Benicar.  Did see urology for his elevated PSA and they had seen protein in the urine.  Advised him to follow-up with me.  Diabetes has been poorly controlled.  He does have follow-up with endocrinology tomorrow morning.  Has been taking his Lipitor daily.  He is nonfasting this morning.  He does have numbness and burning paresthesias in his left greater than his right foot.  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  Past Medical History:  Diagnosis Date  . Abdominal pain 08/04/2016  . Abnormal PSA 05/19/2015  . Arthralgia 11/12/2010  . Benign prostatic hyperplasia with urinary hesitancy 06/14/2018  . Cataract   . CELLULITIS, FOOT, RIGHT 07/02/2008   Qualifier: Diagnosis of  By: Marca Ancona RMA, Lucy    . Cough 08/04/2016  . Diabetes ([place]) 03/04/2007   Qualifier: Diagnosis of  By: Loanne Drilling MD, Jacelyn Pi   . DIABETES MELLITUS, TYPE II 03/04/2007  . Edema 07/02/2008   Qualifier: Diagnosis of  By: Marca Ancona RMA, Lucy    . Elevated LDL cholesterol level 06/14/2018  . Foot ulcer, right ([place]) 04/15/2014  . GERD (gastroesophageal reflux disease)   . HYPERCHOLESTEROLEMIA 07/29/2008  . HYPERTENSION 03/04/2007  . Knee pain, left 05/16/2015  . Loss of vision 04/15/2014  . Nonintractable episodic headache 12/01/2016  . Psoriasis   . SHOULDER PAIN, BILATERAL 01/12/2010   Qualifier: Diagnosis of  By: Loanne Drilling MD, Jacelyn Pi   . Swelling of left foot 08/05/2017  . 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 COLONOSCOPY    . LEFT HEART CATH AND CORONARY ANGIOGRAPHY N/A 07/12/2018   Procedure: LEFT HEART CATH AND CORONARY ANGIOGRAPHY;  Surgeon: Wellington Hampshire, MD;  Location: [facility];  Service: Cardiovascular;  Laterality: N/A;  . stress cardiolite  05/30/2003    Family History  Problem Relation Age of Onset  . Cancer Mother        Breast Cancer  . Cancer Father        uncertain type  . Diabetes Father   . Cancer Brother 39       Colon Cancer  . Colon cancer Neg Hx   . Rectal cancer Neg Hx   . Stomach cancer Neg Hx     Social History   Socioeconomic History  . Marital status: Single    Spouse name: Not on file  . Number of children: 2  . Years of education: 10  . Highest education level: Not on file  Occupational History  . Occupation: roofer  Tobacco Use  . Smoking status: Former Smoker    Quit date: 07/26/1988    Years since quitting: 31.3  . Smokeless tobacco: Never Used  Substance and Sexual Activity  . Alcohol use: No  . Drug use: No  . Sexual activity: Not on file  Other Topics Concern  . Not on file  Social History Narrative   Lives with daughter in a one  story home.  Has 2 children.  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  Education: 10th grade.    Social Determinants of Health   Financial Resource Strain:   . Difficulty of Paying Living Expenses:   Food Insecurity:   . Worried About Charity fundraiser in the Last Year:   . Arboriculturist in the Last Year:   Transportation Needs:   . Film/video editor (Medical):   [name] Lack of Transportation (Non-Medical):   Physical Activity:   . Days of Exercise per Week:   . Minutes of Exercise per Session:   Stress:   . Feeling of Stress :   Social Connections:   . Frequency of Communication with Friends and Family:   . Frequency of Social Gatherings with Friends and Family:   . Attends Religious Services:   . Active Member of Clubs or Organizations:   . Attends Archivist Meetings:     [name] Marital Status:   Intimate Partner Violence:   . Fear of Current or Ex-Partner:   . Emotionally Abused:   [name] Physically Abused:   . Sexually Abused:     Outpatient Medications Prior to Visit  Medication Sig Dispense Refill  . aspirin EC 81 MG tablet Take 1 tablet (81 mg total) by mouth daily.    [name] atorvastatin (LIPITOR) 40 MG tablet Take 1 tablet (40 mg total) by mouth daily. 90 tablet 3  . glucose blood test strip USE TO CHECK BLOOD SUGAR TWICE DAILY 100 each 12  . insulin NPH-regular Human (70-30) 100 UNIT/ML injection Inject 36 Units into the skin 2 (two) times daily with a meal. 10 mL 11  . metFORMIN (GLUCOPHAGE-XR) 500 MG 24 hr tablet Take 1 tablet (500 mg total) by mouth 2 (two) times daily. 60 tablet 6  . metoprolol succinate (TOPROL-XL) 50 MG 24 hr tablet Take 1 tablet (50 mg total) by mouth daily. Take with or immediately following a meal. 90 tablet 3  . olmesartan (BENICAR) 20 MG tablet TAKE 1 TABLET(20 MG) BY MOUTH DAILY 90 tablet 0  . tamsulosin (FLOMAX) 0.4 MG CAPS capsule Take 0.4 mg by mouth daily.    [name] gabapentin (NEURONTIN) 100 MG capsule Take 1 capsule (100 mg total) by mouth 3 (three) times daily. (Patient taking differently: Take 100 mg by mouth daily as needed. ) 90 capsule 1  . docusate sodium (COLACE) 100 MG capsule Take 1 capsule (100 mg total) by mouth 2 (two) times daily. (Patient not taking: Reported on 07/23/2019) 10 capsule 0   No facility-administered medications prior to visit.    No Known Allergies  ROS Review of Systems  Constitutional: Negative.   HENT: Negative.   Eyes: Positive for visual disturbance. Negative for photophobia.  Respiratory: Negative.   Cardiovascular: Negative.   Gastrointestinal: Negative.   Endocrine: Negative for polyphagia and polyuria.  Musculoskeletal: Negative for gait problem and joint swelling.  Skin: Negative for pallor and rash.  Neurological: Positive for numbness. Negative for weakness and light-headedness.   Hematological: Does not bruise/bleed easily.  Psychiatric/Behavioral: Negative.       Objective:    Physical Exam  Constitutional: He is oriented to person, place, and time. He appears well-developed and well-nourished. No distress.  HENT:  Head: Atraumatic.  Right Ear: External ear normal.  Left Ear: External ear normal.  Eyes: Pupils are equal, round, and reactive to light. Conjunctivae are normal. Right eye exhibits no discharge. Left eye exhibits no discharge. No scleral icterus.  Neck: No JVD present. No tracheal deviation present. No thyromegaly present.  Cardiovascular: Normal rate, regular rhythm and normal heart sounds.  Pulses:      Carotid pulses are 1+ on the right side with bruit and 2+ on the left side with bruit.      Dorsalis pedis pulses are 1+ on the right side and 1+ on the left side.       Posterior tibial pulses are 1+ on the right side and 1+ on the left side.  Pulmonary/Chest: Effort normal and breath sounds normal. No stridor.  Lymphadenopathy:    He has no cervical adenopathy.  Neurological: He is alert and oriented to person, place, and time.  Skin: Skin is warm and dry. He is not diaphoretic.  Psychiatric: He has a normal mood and affect. His behavior is normal.   Diabetic Foot Exam - Simple   Simple Foot Form Visual Inspection See comments: Yes Sensation Testing See comments: Yes Pulse Check See comments: Yes Comments  Feet are cavus bilaterally.  Nails great toes are thick and oncotic.  Decreased sensation to light touch left first second and third toes.  Pulses are 1+ throughout.    BP 134/64   Pulse (!) 53   Temp (!) 96.8 F (36 C) (Tympanic)   Ht 6\' 1"  (1.854 m)   Wt 201 lb 9.6 oz (91.4 kg)   SpO2 98%   BMI 26.60 kg/m  Wt Readings from Last 3 Encounters:  12/05/19 201 lb 9.6 oz (91.4 kg)  09/05/19 202 lb 6.4 oz (91.8 kg)  07/23/19 205 lb (93 kg)     Health Maintenance Due  Topic Date Due  . COVID-19 Vaccine (1) Never done  .  OPHTHALMOLOGY EXAM  07/18/2019  . FOOT EXAM  09/08/2019    There are no preventive care reminders to display for this patient.  Lab Results  Component Value Date   TSH 1.18 08/04/2016   Lab Results  Component Value Date   WBC 6.3 07/23/2019   HGB 13.9 07/23/2019   HCT 40.8 07/23/2019   MCV 85.1 07/23/2019   PLT 181.0 07/23/2019   Lab Results  Component Value Date   NA 137 07/23/2019   K 4.4 07/23/2019   CO2 24 07/23/2019   GLUCOSE 212 (H) 07/23/2019   BUN 25 (H) 07/23/2019   CREATININE 0.99 07/23/2019   BILITOT 0.4 07/23/2019   ALKPHOS 68 07/23/2019   AST 14 07/23/2019   ALT 15 07/23/2019   PROT 7.2 07/23/2019   ALBUMIN 4.3 07/23/2019   CALCIUM 9.6 07/23/2019   GFR 74.91 07/23/2019   Lab Results  Component Value Date   CHOL 198 07/23/2019   Lab Results  Component Value Date   HDL 42.60 07/23/2019   Lab Results  Component Value Date   LDLCALC 117 (H) 07/23/2019   Lab Results  Component Value Date   TRIG 191.0 (H) 07/23/2019   Lab Results  Component Value Date   CHOLHDL 5 07/23/2019   Lab Results  Component Value Date   HGBA1C 10.4 (H) 07/23/2019      Assessment & Plan:   Problem List Items Addressed This Visit      Cardiovascular and Mediastinum   Essential hypertension - Primary   Relevant Orders   CBC   Comprehensive metabolic panel   Urinalysis, Routine w reflex microscopic   Microalbumin / creatinine urine ratio     Endocrine   Type 2 diabetes mellitus with diabetic polyneuropathy, with long-term current use of  insulin (HCC)   Relevant Medications   gabapentin (NEURONTIN) 300 MG capsule   Other Relevant Orders   Hemoglobin A1c   Urinalysis, Routine w reflex microscopic   Microalbumin / creatinine urine ratio   Ambulatory referral to Ophthalmology     Other   HYPERCHOLESTEROLEMIA   Relevant Orders   LDL cholesterol, direct   Bilateral carotid bruits   Relevant Orders   LDL cholesterol, direct   VAS US CAROTID    Other Visit  Diagnoses    Neuropathy       Relevant Medications   gabapentin (NEURONTIN) 300 MG capsule      Meds ordered this encounter  Medications  . gabapentin (NEURONTIN) 300 MG capsule    Sig: Take one at night for one week and then increase to one twice daily.    Dispense:  60 capsule    Refill:  3    Follow-up: Return in about 3 months (around 03/06/2020).    [name], MD

## 2019-12-06 ENCOUNTER — Ambulatory Visit (INDEPENDENT_AMBULATORY_CARE_PROVIDER_SITE_OTHER): Payer: Medicare Other | Admitting: Internal Medicine

## 2019-12-06 ENCOUNTER — Encounter: Payer: Self-pay | Admitting: Internal Medicine

## 2019-12-06 ENCOUNTER — Other Ambulatory Visit: Payer: Self-pay

## 2019-12-06 VITALS — BP 138/62 | HR 54 | Temp 98.1°F | Ht 73.0 in | Wt 202.4 lb

## 2019-12-06 DIAGNOSIS — Z794 Long term (current) use of insulin: Secondary | ICD-10-CM

## 2019-12-06 DIAGNOSIS — E1165 Type 2 diabetes mellitus with hyperglycemia: Secondary | ICD-10-CM | POA: Diagnosis not present

## 2019-12-06 DIAGNOSIS — E1159 Type 2 diabetes mellitus with other circulatory complications: Secondary | ICD-10-CM

## 2019-12-06 DIAGNOSIS — E1142 Type 2 diabetes mellitus with diabetic polyneuropathy: Secondary | ICD-10-CM | POA: Diagnosis not present

## 2019-12-06 LAB — GLUCOSE, POCT (MANUAL RESULT ENTRY): POC Glucose: 274 mg/dL — AB (ref 70–99)

## 2019-12-06 LAB — POCT GLYCOSYLATED HEMOGLOBIN (HGB A1C)

## 2019-12-06 MED ORDER — FARXIGA 5 MG PO TABS: 5.0000 mg | ORAL_TABLET | Freq: Every day | ORAL | 4 refills | Status: DC

## 2019-12-06 MED ORDER — METFORMIN HCL ER 500 MG PO TB24: 1000.0000 mg | ORAL_TABLET | Freq: Two times a day (BID) | ORAL | 3 refills | Status: DC

## 2019-12-06 NOTE — Patient Instructions (Addendum)
-   Change Metformin 500 mg, ONE tablet with Breakfast TWO  tablets with Supper for 1 week, if no nausea or diarrhea , Please increase to TWO tablet with breakfast and TWO tablets with supper  - START Farxiga 5 mg , 1 tablet with Breakfast daily  - ReliOn Mix (70/30) continue to take 30 units before breakfast and 35 units before Supper  - Try and take it 20 minutes before you eat   - Before you start working, check your sugar first, if its below 150 mg/dL, eat a protein Bar to prevent low sugars, but if its 150 or higher, you are good.        HOW TO TREAT LOW BLOOD SUGARS (Blood sugar LESS THAN 70 MG/DL)  Please follow the RULE OF 15 for the treatment of hypoglycemia treatment (when your (blood sugars are less than 70 mg/dL)    STEP 1: Take 15 grams of carbohydrates when your blood sugar is low, which includes:   3-4 GLUCOSE TABS  OR  3-4 OZ OF JUICE OR REGULAR SODA OR  ONE TUBE OF GLUCOSE GEL     STEP 2: RECHECK blood sugar in 15 MINUTES STEP 3: If your blood sugar is still low at the 15 minute recheck --> then, go back to STEP 1 and treat AGAIN with another 15 grams of carbohydrates.

## 2019-12-06 NOTE — Progress Notes (Addendum)
Name: Scott Donaldson  Age/ Sex: 69 y.o., male   MRN/ DOB: [id number], December 25, 1950     PCP: [name], MD   Reason for Endocrinology Evaluation: Type 2 Diabetes Mellitus  Initial Endocrine Consultative Visit: 05/01/2018    PATIENT IDENTIFIER: [name] is a 69 y.o. male with a past medical history of T2DM and HTN. The patient has followed with Endocrinology clinic since 05/01/2018 for consultative assistance with management of his diabetes.  DIABETIC HISTORY:  Scott Donaldson was diagnosed with DM in 1990, was on oral glycemic agents until 2010, in the interim he lost health insurance and was started on ReliOn insulin . His hemoglobin A1c has ranged from 8.3% in 2016, peaking at 12.2% in 2019.  He is a roofer with fear of hypoglycemia  SUBJECTIVE:   During the last visit (09/05/2019): A1c 10.4% . We started metformin and continued ReliOn mix   Today (12/06/2019): Scott Donaldson  He checks his blood sugars 2 times daily, preprandial to breakfast and evening .The pt did not bring his meter today.  The patient has had hypoglycemic episodes since the last clinic visit, which typically occur 1 x / week- most often occuring between 12-1 pm.  The patient is symptomatic with these episodes      HOME DIABETES REGIMEN:  Metformin 500 mg XR BID ReliOn Mix 70/30 at 45 units BID - takes 30 units with Breakfast, 35 units with supper     Statin: yes ACE-I/ARB: yes    METER DOWNLOAD SUMMARY: Did not bring    DIABETIC COMPLICATIONS: Microvascular complications:   Neuropathy  Denies: CKD, retinopathy  Last eye exam: in 2019, has a pending referral  Macrovascular complications:   CAD (S/P PCI)   Denies: PVD, CVA  HISTORY:  Past Medical History:  Past Medical History:  Diagnosis Date  . Abdominal pain 08/04/2016  . Abnormal PSA 05/19/2015  . Arthralgia 11/12/2010  . Benign prostatic hyperplasia with urinary hesitancy 06/14/2018  . Cataract   .  CELLULITIS, FOOT, RIGHT 07/02/2008   Qualifier: Diagnosis of  By: Marca Ancona RMA, Lucy    . Cough 08/04/2016  . Diabetes ([place]) 03/04/2007   Qualifier: Diagnosis of  By: Loanne Drilling MD, Jacelyn Pi   . DIABETES MELLITUS, TYPE II 03/04/2007  . Edema 07/02/2008   Qualifier: Diagnosis of  By: Marca Ancona RMA, Lucy    . Elevated LDL cholesterol level 06/14/2018  . Foot ulcer, right ([place]) 04/15/2014  . GERD (gastroesophageal reflux disease)   . HYPERCHOLESTEROLEMIA 07/29/2008  . HYPERTENSION 03/04/2007  . Knee pain, left 05/16/2015  . Loss of vision 04/15/2014  . Nonintractable episodic headache 12/01/2016  . Psoriasis   . SHOULDER PAIN, BILATERAL 01/12/2010   Qualifier: Diagnosis of  By: Loanne Drilling MD, Jacelyn Pi   . Swelling of left foot 08/05/2017  . 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 COLONOSCOPY    . LEFT HEART CATH AND CORONARY ANGIOGRAPHY N/A 07/12/2018   Procedure: LEFT HEART CATH AND CORONARY ANGIOGRAPHY;  Surgeon: Wellington Hampshire, MD;  Location: [facility];  Service: Cardiovascular;  Laterality: N/A;  . stress cardiolite  05/30/2003    Social History:  reports that he quit smoking about 31 years ago. He has never used smokeless tobacco. He reports that he does not drink alcohol or use drugs. Family History:  Family History  Problem  Relation Age of Onset  . Cancer Mother        Breast Cancer  . Cancer Father        uncertain type  . Diabetes Father   . Cancer Brother 19       Colon Cancer  . Colon cancer Neg Hx   . Rectal cancer Neg Hx   . Stomach cancer Neg Hx      HOME MEDICATIONS: Allergies as of 12/06/2019   No Known Allergies     Medication List       Accurate as of Dec 06, 2019  8:43 AM. If you have any questions, ask your nurse or doctor.        aspirin EC 81 MG tablet Take 1 tablet (81 mg total) by mouth daily.   atorvastatin 40 MG tablet  Commonly known as: LIPITOR Take 1 tablet (40 mg total) by mouth daily.   docusate sodium 100 MG capsule Commonly known as: Colace Take 1 capsule (100 mg total) by mouth 2 (two) times daily.   Farxiga 5 MG Tabs tablet Generic drug: dapagliflozin propanediol Take 5 mg by mouth daily before breakfast. Started by: Dorita Sciara, MD   gabapentin 300 MG capsule Commonly known as: NEURONTIN Take one at night for one week and then increase to one twice daily.   glucose blood test strip USE TO CHECK BLOOD SUGAR TWICE DAILY   insulin NPH-regular Human (70-30) 100 UNIT/ML injection Inject 36 Units into the skin 2 (two) times daily with a meal.   metFORMIN 500 MG 24 hr tablet Commonly known as: GLUCOPHAGE-XR Take 1 tablet (500 mg total) by mouth 2 (two) times daily.   metoprolol succinate 50 MG 24 hr tablet Commonly known as: TOPROL-XL Take 1 tablet (50 mg total) by mouth daily. Take with or immediately following a meal.   olmesartan 20 MG tablet Commonly known as: BENICAR TAKE 1 TABLET(20 MG) BY MOUTH DAILY   tamsulosin 0.4 MG Caps capsule Commonly known as: FLOMAX Take 0.4 mg by mouth daily.        OBJECTIVE:   Vital Signs: BP 138/62 (BP Location: Left Arm, Patient Position: Sitting, Cuff Size: Large)   Pulse (!) 54   Temp 98.1 F (36.7 C)   Ht 6\' 1"  (1.854 m)   Wt 202 lb 6.4 oz (91.8 kg)   SpO2 98%   BMI 26.70 kg/m   Wt Readings from Last 3 Encounters:  12/06/19 202 lb 6.4 oz (91.8 kg)  12/05/19 201 lb 9.6 oz (91.4 kg)  09/05/19 202 lb 6.4 oz (91.8 kg)     Exam: General: Pt appears well and is in NAD  Lungs: Clear with good BS bilat with no rales, rhonchi, or wheezes  Heart: RRR with normal S1 and S2 and no gallops; no murmurs; no rub  Abdomen: Normoactive bowel sounds, soft, nontender, without masses or organomegaly palpable  Extremities: No pretibial edema.  Neuro: MS is good with appropriate affect, pt is alert and Ox3            DATA  REVIEWED:  Lab Results  Component Value Date   HGBA1C 8.8 (A) 12/06/2019   HGBA1C 9.3 (H) 12/05/2019   HGBA1C 10.4 (H) 07/23/2019    Results for Scott Donaldson, Scott Donaldson (MRN [id number]) as of 12/06/2019 08:49  Ref. Range 12/05/2019 11:17  Sodium Latest Ref Range: 135 - 145 mEq/L 138  Potassium Latest Ref Range: 3.5 - 5.1 mEq/L 4.7  Chloride Latest Ref Range: 96 - 112  mEq/L 103  CO2 Latest Ref Range: 19 - 32 mEq/L 26  Glucose Latest Ref Range: 70 - 99 mg/dL 235 (H)  BUN Latest Ref Range: 6 - 23 mg/dL 29 (H)  Creatinine Latest Ref Range: 0.40 - 1.50 mg/dL 0.93  Calcium Latest Ref Range: 8.4 - 10.5 mg/dL 10.1  Alkaline Phosphatase Latest Ref Range: 39 - 117 U/L 69  Albumin Latest Ref Range: 3.5 - 5.2 g/dL 4.3  AST Latest Ref Range: 0 - 37 U/L 15  ALT Latest Ref Range: 0 - 53 U/L 14  Total Protein Latest Ref Range: 6.0 - 8.3 g/dL 7.4  Total Bilirubin Latest Ref Range: 0.2 - 1.2 mg/dL 0.4  GFR Latest Ref Range: >60.00 mL/min 80.43         Results for Scott Donaldson, Scott Donaldson (MRN [id number]) as of 12/06/2019 08:49  Ref. Range 12/05/2019 11:17  Creatinine,U Latest Units: mg/dL 128.9  Microalb, Ur Latest Ref Range: 0.0 - 1.9 mg/dL 40.5 (H)  MICROALB/CREAT RATIO Latest Ref Range: 0.0 - 30.0 mg/g 31.5 (H)    In-Office BG 274 mg/dL  ASSESSMENT / PLAN / RECOMMENDATIONS:   1) Type 2 Diabetes Mellitus, Poorly controlled, With neuropathic and macrovascular complications - Most recent A1c of 8.8 %. ( based on fingerstick A1c on today's visit ) , Goal A1c < 7.0 %.    - A1c trending down from 10.4 % , his main barriers to diabetes care is hypoglycemia and his fear of it.  - Despite his hyperglycemia, he continues to describe hypoglycemia in the afternoon while he is at work. No meter today  - He is tolerating Metformin well, will titrate to max dose of 2000mg  daily  - We also discussed adding Farxiga , we discussed the benefits as well as risk of dehydration and genital infections, will start at a small  dose. Will check BMP on next visit  - He was again reminded to take the insulin 20-30 minutes before a meal  - To help prevent hypoglycemia during work hours, he was instructed to check BG before starting work, if BG is < 150 mg/dL he needs to eat a protein bar ( pt advised not to eat crackers)   MEDICATIONS:  Increase Metformin 500 mg XR, 2 tablets BID  Start Farxiga 5 mg daily with Breakfast   Continue ReliOn mix 30 units before Breakfast and 35 units before supper   EDUCATION / INSTRUCTIONS:  BG monitoring instructions: Patient is instructed to check his blood sugars 2 times a day, before breakfast and supper .  Call [facility] if: BG persistently < 70 . [name] I reviewed the Rule of 15 for the treatment of hypoglycemia in detail with the patient. Literature supplied.   2) Diabetic complications:   Eye: Does not have known diabetic retinopathy.   Neuro/ Feet: Does  have known diabetic peripheral neuropathy .   Renal: Patient does not have known baseline CKD. He   is  on an ACEI/ARB at present.    F/U in 3 months    Signed electronically by: Mack Guise, MD  [facility]  [facility] [address] [place] [address], [place] [address] Phone: [phone] FAX: [phone]   CC: [name], [place] [address] Phone: [phone]  Fax: [phone]  Return to Endocrinology clinic as below: Future Appointments  Date Time Provider [place]  12/25/2019 12:30 PM [facility]CM PHARMACIST [facility]  03/07/2020  8:30 AM [name],  MD [facility]

## 2019-12-12 ENCOUNTER — Other Ambulatory Visit: Payer: Self-pay

## 2019-12-12 ENCOUNTER — Encounter: Payer: Self-pay | Admitting: Family Medicine

## 2019-12-12 ENCOUNTER — Ambulatory Visit (HOSPITAL_BASED_OUTPATIENT_CLINIC_OR_DEPARTMENT_OTHER)
Admission: RE | Admit: 2019-12-12 | Discharge: 2019-12-12 | Disposition: A | Discharge status: Home / Self Care | Payer: Medicare Other | Source: Ambulatory Visit | Attending: Family Medicine | Admitting: Family Medicine

## 2019-12-12 DIAGNOSIS — R0989 Other specified symptoms and signs involving the circulatory and respiratory systems: Secondary | ICD-10-CM | POA: Diagnosis not present

## 2019-12-17 ENCOUNTER — Encounter: Payer: Self-pay | Admitting: Family Medicine

## 2019-12-17 DIAGNOSIS — E119 Type 2 diabetes mellitus without complications: Secondary | ICD-10-CM | POA: Diagnosis not present

## 2019-12-17 DIAGNOSIS — H5211 Myopia, right eye: Secondary | ICD-10-CM | POA: Diagnosis not present

## 2019-12-17 LAB — HM DIABETES EYE EXAM

## 2019-12-25 ENCOUNTER — Telehealth: Payer: Medicare Other

## 2019-12-25 NOTE — Chronic Care Management (AMB) (Deleted)
Chronic Care Management Pharmacy  Name: Scott Donaldson  MRN: [id number] DOB: [birth year]  Chief Complaint/ HPI  Scott Donaldson,  69 y.o. , male presents for their Follow-Up CCM visit with the clinical pharmacist via telephone. Today's visit took place with [name], patient's daughter and caregiver. She handles all of his medications, cooking, and provides transportation to his appointments.   PCP : [name], MD  Their chronic conditions include: Hypertension, CAD, Type 2 diabetes, osteoarthritis, hypercholesterolemia   Office Visits: 12/05/19: Patient presented to Dr. Ethelene Hal for HTN follow-up. BP in clinic 134/64. Patient with numbness and burning in feet. Gabapentin increased to 300 mg bID.  08/29/19: patient presented to Dr. Ethelene Hal for DM follow-up. Atorvastatin increased to 40 mg daily, metoprolol increased to 50 mg daily.  07/22/20: Patient presented to Dr. Ethelene Hal for DM follow-up. LDL elevated. A1c worsened to 10.4%  Consult Visit: 12/06/19: Patient presented to Dr. Kelton Pillar (endo) for T2DM follow-up. A1c improved slightly to 9.3%. Metformin increased to 1000 mg BID, started on Fargixa.  11/19/19: Patient reports presenting to urologist (not available in EMR). Patient reports proteinuria. Patient started on tamsulosin 0.4 mg daily 10/10/19: Patient called Dr. Kelton Pillar due to concerns of hypogylcemia. Reported BG ranging 57-224. Insulin decreased to 36 units BID.  09/05/19: Patient presented to Dr. Kelton Pillar (Endo) for T2DM follow-up. Patient NPH reduced to 45 units BID, patient started on metformin ER 500 mg BID.   Medications: Outpatient Encounter Medications as of 12/25/2019  Medication Sig   aspirin EC 81 MG tablet Take 1 tablet (81 mg total) by mouth daily.   atorvastatin (LIPITOR) 40 MG tablet Take 1 tablet (40 mg total) by mouth daily.   dapagliflozin propanediol (FARXIGA) 5 MG TABS tablet Take 5 mg by mouth daily before breakfast.   docusate  sodium (COLACE) 100 MG capsule Take 1 capsule (100 mg total) by mouth 2 (two) times daily. (Patient not taking: Reported on 07/23/2019)   gabapentin (NEURONTIN) 300 MG capsule Take one at night for one week and then increase to one twice daily.   glucose blood test strip USE TO CHECK BLOOD SUGAR TWICE DAILY   insulin NPH-regular Human (70-30) 100 UNIT/ML injection Inject 36 Units into the skin 2 (two) times daily with a meal.   metFORMIN (GLUCOPHAGE-XR) 500 MG 24 hr tablet Take 2 tablets (1,000 mg total) by mouth 2 (two) times daily.   metoprolol succinate (TOPROL-XL) 50 MG 24 hr tablet Take 1 tablet (50 mg total) by mouth daily. Take with or immediately following a meal.   olmesartan (BENICAR) 20 MG tablet TAKE 1 TABLET(20 MG) BY MOUTH DAILY   tamsulosin (FLOMAX) 0.4 MG CAPS capsule Take 0.4 mg by mouth daily.   No facility-administered encounter medications on file as of 12/25/2019.   Current Diagnosis/Assessment:  Goals Addressed   None     Diabetes   Recent Relevant Labs: Lab Results  Component Value Date/Time   HGBA1C 9.3 (H) 12/05/2019 11:17 AM   HGBA1C 10.4 (H) 07/23/2019 08:59 AM   MICROALBUR 40.5 (H) 12/05/2019 11:17 AM   MICROALBUR 37.8 (H) 07/23/2019 08:59 AM    Checking BG: Daily   AM Pre-Lunch  10-May 226   9-May 294   8-May 279   6-May  173  2-May 249   1-May 178   26-Apr 298   Average 254    Patient has failed these meds in past: n/a Patient is currently uncontrolled on the following medications:   Fargixa 5 mg daily  Metformin XR 1000 mg BID (AM, EM)   NPH 70/30 36 units BID   Last diabetic Foot exam: No results found for: HMDIABEYEEXA  Last diabetic Eye exam: No results found for: HMDIABFOOTEX   We discussed: diet and exercise extensively. Not following low carb diet, eats mainly what he wants minimal activity. Still works part time. Drinks mainly water powerade or soda. Denies   Plan  Recommend A1c  Recommend increasing metformin to 1000  mg BID  Hypertension   BP today is: n/a  Office blood pressures are  BP Readings from Last 3 Encounters:  12/06/19 138/62  12/05/19 134/64  09/05/19 (!) 142/76   CMP Latest Ref Rng & Units 12/05/2019 07/23/2019 02/12/2019  Glucose 70 - 99 mg/dL 235(H) 212(H) 422(H)  BUN 6 - 23 mg/dL 29(H) 25(H) 67(H)  Creatinine 0.40 - 1.50 mg/dL 0.93 0.99 1.43  Sodium 135 - 145 mEq/L 138 137 136  Potassium 3.5 - 5.1 mEq/L 4.7 4.4 4.7  Chloride 96 - 112 mEq/L 103 105 105  CO2 19 - 32 mEq/L 26 24 21   Calcium 8.4 - 10.5 mg/dL 10.1 9.6 8.8  Total Protein 6.0 - 8.3 g/dL 7.4 7.2 7.3  Total Bilirubin 0.2 - 1.2 mg/dL 0.4 0.4 0.4  Alkaline Phos 39 - 117 U/L 69 68 69  AST 0 - 37 U/L 15 14 16   ALT 0 - 53 U/L 14 15 14    Patient has failed these meds in the past: lisinopril, HCTZ,  Patient is currently uncontrolled on the following medications:   Olmesartan 20 mg daily (HS)   Metoprolol XL 50 mg daily  (HS) Patient checks BP at home never. Patient does not have BP cuff.   Patient home BP readings are ranging: n/a  We discussed diet and exercise extensively  Plan  Continue current medications  Increase blood pressure monitoring to daily for 3 weeks.   Hyperlipidemia   07/12/18: L.Cath revealed Mild-moderate nonobstructive coronary artery disease.   Lipid Panel     Component Value Date/Time   CHOL 198 07/23/2019 0859   TRIG 191.0 (H) 07/23/2019 0859   HDL 42.60 07/23/2019 0859   CHOLHDL 5 07/23/2019 0859   VLDL 38.2 07/23/2019 0859   LDLCALC 117 (H) 07/23/2019 0859   LDLDIRECT 79.0 12/05/2019 1117     The 10-year ASCVD risk score Mikey Bussing DC Jr., et al., 2013) is: 40.4%   Values used to calculate the score:     Age: 30 years     Sex: Male     Is Non-Hispanic African American: No     Diabetic: Yes     Tobacco smoker: No     Systolic Blood Pressure: 0000000 mmHg     Is BP treated: Yes     HDL Cholesterol: 42.6 mg/dL     Total Cholesterol: 198 mg/dL   Patient has failed these meds in past:  n/a Patient is currently controlled on the following medications:   Aspirin 81 mg daily (HS)  Atorvastatin 40 mg daily (HS) We discussed:  diet and exercise extensively  Plan  Continue current medications Recommend follow-up Lipid Panel  BPH   Dr. Harrell Gave Lovena Neighbours  PSA  Date Value Ref Range Status  07/23/2019 11.88 (H) 0.10 - 4.00 ng/mL Final    Comment:    Test performed using Access Hybr[employer]ch PSA Assay, a parmagnetic partical, chemiluminecent immunoassay.  02/12/2019 9.45 (H) 0.10 - 4.00 ng/mL Final    Comment:    Test performed using Access Hybritech PSA Assay, a  parmagnetic partical, chemiluminecent immunoassay.  08/21/2018 6.20 (H) 0.10 - 4.00 ng/mL Final    Comment:    Test performed using Access Hybritech PSA Assay, a parmagnetic partical, chemiluminecent immunoassay.    Patient has failed these meds in past: n/a Patient is currently uncontrolled on the following medications:   Tamsulosin 0.4 mg daily (AM)   We discussed:  Urinary symptoms seem to have improved since starting tamsulosin.   Plan  Continue current medications  Osteoarthritis + Diabetic neuropathy    Patient has failed these meds in past: n/a Patient is currently uncontrolled on the following medications:   Gabapentin 300 mg BID (takes as needed)  We discussed:  Knee pain fairly limiting. Gabapentin provides minimal relief.   Plan  Recommend Tylenol Arthritis strength 650 mg CR twice daily   Vaccines   Reviewed and discussed patient's vaccination history.    Immunization History  Administered Date(s) Administered   Fluad Quad(high Dose 65+) 05/31/2019   Influenza Split 05/17/2011   Influenza Whole 05/15/2010   Influenza, High Dose Seasonal PF 06/14/2018   Influenza,inj,Quad PF,6+ Mos 06/12/2013   Pneumococcal Conjugate-13 08/04/2016   Pneumococcal Polysaccharide-23 05/16/2015   Td 11/23/1997, 07/02/2008   Tdap 09/02/2016    Plan  Recommended patient receive Covid-19 and  Shingrix vaccine  Medication Management   Pt uses [facility] for all medications. >5 day fill history gap noted for olmesartan Uses pill box? Yes  We discussed: n/a   Plan  Continue current medication management strategy  Follow up: 1 month phone visit  [place] at [facility]  [phone]

## 2020-01-08 ENCOUNTER — Other Ambulatory Visit: Payer: Self-pay | Admitting: Family Medicine

## 2020-01-08 DIAGNOSIS — E1142 Type 2 diabetes mellitus with diabetic polyneuropathy: Secondary | ICD-10-CM

## 2020-01-08 DIAGNOSIS — I2510 Atherosclerotic heart disease of native coronary artery without angina pectoris: Secondary | ICD-10-CM

## 2020-01-08 DIAGNOSIS — E7800 Pure hypercholesterolemia, unspecified: Secondary | ICD-10-CM

## 2020-01-08 DIAGNOSIS — I10 Essential (primary) hypertension: Secondary | ICD-10-CM

## 2020-01-17 ENCOUNTER — Other Ambulatory Visit: Payer: Self-pay

## 2020-01-18 ENCOUNTER — Encounter: Payer: Self-pay | Admitting: Family Medicine

## 2020-01-18 ENCOUNTER — Ambulatory Visit (INDEPENDENT_AMBULATORY_CARE_PROVIDER_SITE_OTHER): Payer: Medicare Other | Admitting: Family Medicine

## 2020-01-18 VITALS — BP 118/68 | HR 58 | Temp 98.6°F | Ht 73.0 in | Wt 204.4 lb

## 2020-01-18 DIAGNOSIS — N50812 Left testicular pain: Secondary | ICD-10-CM

## 2020-01-18 DIAGNOSIS — R1031 Right lower quadrant pain: Secondary | ICD-10-CM | POA: Diagnosis not present

## 2020-01-18 DIAGNOSIS — K5901 Slow transit constipation: Secondary | ICD-10-CM | POA: Diagnosis not present

## 2020-01-18 NOTE — Progress Notes (Signed)
Established Patient Office Visit  Subjective:  Patient ID: Scott Donaldson, male    DOB: August 08, 1950  Age: 69 y.o. MRN: [id number]  CC:  Chief Complaint  Patient presents with  . Abdominal Pain    C/O lower abdominal pains that does not seem to be getting better. Couple of days ago patient was not able to walk do to pains.     HPI Scott Donaldson presents for ongoing history of lower abdominal pain seems to be worse over the last few days.  Ambulation is now difficult for him because of the pain.  He denies fevers but has experienced some nausea.  Stools every few days.  Bowel movements are hard.  Denies melena or blood in his stool.  Ongoing history of BPH with a week urine stream.  No recent changes.  Denies dysuria or hematuria.  No discharge.  Not sexually active.  No weight loss.  Past Medical History:  Diagnosis Date  . Abdominal pain 08/04/2016  . Abnormal PSA 05/19/2015  . Arthralgia 11/12/2010  . Benign prostatic hyperplasia with urinary hesitancy 06/14/2018  . Cataract   . CELLULITIS, FOOT, RIGHT 07/02/2008   Qualifier: Diagnosis of  By: Marca Ancona RMA, Lucy    . Cough 08/04/2016  . Diabetes ([place]) 03/04/2007   Qualifier: Diagnosis of  By: Loanne Drilling MD, Jacelyn Pi   . DIABETES MELLITUS, TYPE II 03/04/2007  . Edema 07/02/2008   Qualifier: Diagnosis of  By: Marca Ancona RMA, Lucy    . Elevated LDL cholesterol level 06/14/2018  . Foot ulcer, right ([place]) 04/15/2014  . GERD (gastroesophageal reflux disease)   . HYPERCHOLESTEROLEMIA 07/29/2008  . HYPERTENSION 03/04/2007  . Knee pain, left 05/16/2015  . Loss of vision 04/15/2014  . Nonintractable episodic headache 12/01/2016  . Psoriasis   . SHOULDER PAIN, BILATERAL 01/12/2010   Qualifier: Diagnosis of  By: Loanne Drilling MD, Jacelyn Pi   . Swelling of left foot 08/05/2017  . 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 COLONOSCOPY    . LEFT HEART CATH AND  CORONARY ANGIOGRAPHY N/A 07/12/2018   Procedure: LEFT HEART CATH AND CORONARY ANGIOGRAPHY;  Surgeon: Wellington Hampshire, MD;  Location: [facility];  Service: Cardiovascular;  Laterality: N/A;  . stress cardiolite  05/30/2003    Family History  Problem Relation Age of Onset  . Cancer Mother        Breast Cancer  . Cancer Father        uncertain type  . Diabetes Father   . Cancer Brother 40       Colon Cancer  . Colon cancer Neg Hx   . Rectal cancer Neg Hx   . Stomach cancer Neg Hx     Social History   Socioeconomic History  . Marital status: Single    Spouse name: Not on file  . Number of children: 2  . Years of education: 10  . Highest education level: Not on file  Occupational History  . Occupation: roofer  Tobacco Use  . Smoking status: Former Smoker    Quit date: 07/26/1988    Years since quitting: 31.5  . Smokeless tobacco: Never Used  Vaping Use  . Vaping Use: Never used  Substance and Sexual Activity  . Alcohol use: No  . Drug use: No  . Sexual activity: Not on file  Other Topics Concern  . Not on  file  Social History Narrative   Lives with daughter in a one story home.  Has 2 children.  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  Education: 10th grade.    Social Determinants of Health   Financial Resource Strain:   . Difficulty of Paying Living Expenses:   Food Insecurity:   . Worried About Charity fundraiser in the Last Year:   . Arboriculturist in the Last Year:   Transportation Needs:   . Film/video editor (Medical):   [name] Lack of Transportation (Non-Medical):   Physical Activity:   . Days of Exercise per Week:   . Minutes of Exercise per Session:   Stress:   . Feeling of Stress :   Social Connections:   . Frequency of Communication with Friends and Family:   . Frequency of Social Gatherings with Friends and Family:   . Attends Religious Services:   . Active Member of Clubs or Organizations:   . Attends Archivist Meetings:   [name]  Marital Status:   Intimate Partner Violence:   . Fear of Current or Ex-Partner:   . Emotionally Abused:   [name] Physically Abused:   . Sexually Abused:     Outpatient Medications Prior to Visit  Medication Sig Dispense Refill  . aspirin EC 81 MG tablet Take 1 tablet (81 mg total) by mouth daily.    [name] atorvastatin (LIPITOR) 40 MG tablet Take 1 tablet (40 mg total) by mouth daily. 90 tablet 3  . dapagliflozin propanediol (FARXIGA) 5 MG TABS tablet Take 5 mg by mouth daily before breakfast. 30 tablet 4  . gabapentin (NEURONTIN) 300 MG capsule Take one at night for one week and then increase to one twice daily. 60 capsule 3  . glucose blood test strip USE TO CHECK BLOOD SUGAR TWICE DAILY 100 each 12  . insulin NPH-regular Human (70-30) 100 UNIT/ML injection Inject 36 Units into the skin 2 (two) times daily with a meal. 10 mL 11  . metFORMIN (GLUCOPHAGE-XR) 500 MG 24 hr tablet Take 2 tablets (1,000 mg total) by mouth 2 (two) times daily. 360 tablet 3  . metoprolol succinate (TOPROL-XL) 50 MG 24 hr tablet Take 1 tablet (50 mg total) by mouth daily. Take with or immediately following a meal. 90 tablet 3  . olmesartan (BENICAR) 20 MG tablet TAKE 1 TABLET(20 MG) BY MOUTH DAILY 90 tablet 0  . tamsulosin (FLOMAX) 0.4 MG CAPS capsule Take 0.4 mg by mouth daily.    [name] docusate sodium (COLACE) 100 MG capsule Take 1 capsule (100 mg total) by mouth 2 (two) times daily. (Patient not taking: Reported on 07/23/2019) 10 capsule 0   No facility-administered medications prior to visit.    No Known Allergies  ROS Review of Systems  Constitutional: Negative.   Respiratory: Negative.   Cardiovascular: Negative.   Gastrointestinal: Positive for abdominal pain, constipation and nausea. Negative for anal bleeding, blood in stool, diarrhea, rectal pain and vomiting.  Genitourinary: Positive for difficulty urinating. Negative for decreased urine volume, discharge, dysuria, hematuria and urgency.  Musculoskeletal:  Positive for gait problem.  Psychiatric/Behavioral: Negative.       Objective:    Physical Exam Constitutional:      General: He is not in acute distress.    Appearance: He is well-developed. He is not ill-appearing, toxic-appearing or diaphoretic.  HENT:     Head: Normocephalic and atraumatic.  Cardiovascular:     Rate and Rhythm: Normal rate and regular rhythm.  Pulmonary:     Effort: Pulmonary effort is normal.     Breath sounds: Normal breath sounds.  Abdominal:     General: Abdomen is protuberant. Bowel sounds are absent. There is no distension.     Tenderness: There is abdominal tenderness in the right lower quadrant. There is guarding. There is no rebound.     Hernia: There is no hernia in the umbilical area, ventral area, left inguinal area or right inguinal area.  Genitourinary:    Testes:        Right: Mass, tenderness or swelling not present.        Left: Tenderness present. Mass or swelling not present.  Neurological:     Mental Status: He is alert.  Psychiatric:        Mood and Affect: Mood normal. Mood is not anxious.        Behavior: Behavior normal.     BP 118/68   Pulse (!) 58   Temp 98.6 F (37 C) (Tympanic)   Ht 6\' 1"  (1.854 m)   Wt 204 lb 6.4 oz (92.7 kg)   SpO2 95%   BMI 26.97 kg/m  Wt Readings from Last 3 Encounters:  01/18/20 204 lb 6.4 oz (92.7 kg)  12/06/19 202 lb 6.4 oz (91.8 kg)  12/05/19 201 lb 9.6 oz (91.4 kg)     Health Maintenance Due  Topic Date Due  . COVID-19 Vaccine (1) Never done  . OPHTHALMOLOGY EXAM  07/18/2019  . FOOT EXAM  09/08/2019    There are no preventive care reminders to display for this patient.  Lab Results  Component Value Date   TSH 1.18 08/04/2016   Lab Results  Component Value Date   WBC 5.6 12/05/2019   HGB 13.3 12/05/2019   HCT 38.0 (L) 12/05/2019   MCV 85.2 12/05/2019   PLT 175.0 12/05/2019   Lab Results  Component Value Date   NA 138 12/05/2019   K 4.7 12/05/2019   CO2 26 12/05/2019    GLUCOSE 235 (H) 12/05/2019   BUN 29 (H) 12/05/2019   CREATININE 0.93 12/05/2019   BILITOT 0.4 12/05/2019   ALKPHOS 69 12/05/2019   AST 15 12/05/2019   ALT 14 12/05/2019   PROT 7.4 12/05/2019   ALBUMIN 4.3 12/05/2019   CALCIUM 10.1 12/05/2019   GFR 80.43 12/05/2019   Lab Results  Component Value Date   CHOL 198 07/23/2019   Lab Results  Component Value Date   HDL 42.60 07/23/2019   Lab Results  Component Value Date   LDLCALC 117 (H) 07/23/2019   Lab Results  Component Value Date   TRIG 191.0 (H) 07/23/2019   Lab Results  Component Value Date   CHOLHDL 5 07/23/2019   Lab Results  Component Value Date   HGBA1C 9.3 (H) 12/05/2019      Assessment & Plan:   Problem List Items Addressed This Visit      Digestive   Slow transit constipation    Other Visit Diagnoses    Right lower quadrant abdominal pain    -  Primary   Pain in left testicle          No orders of the defined types were placed in this encounter.   Follow-up: Return TO ER. Fu with me after evaluation..  Needs a more urgent evaluation with lack of bowel sounds.  Right lower quadrant pain does not correlate with left testicular pain.  Question obstruction.  [name], MD

## 2020-01-21 ENCOUNTER — Ambulatory Visit: Payer: Medicare Other | Admitting: Family Medicine

## 2020-02-05 ENCOUNTER — Emergency Department (HOSPITAL_BASED_OUTPATIENT_CLINIC_OR_DEPARTMENT_OTHER): Payer: Medicare Other

## 2020-02-05 ENCOUNTER — Encounter (HOSPITAL_BASED_OUTPATIENT_CLINIC_OR_DEPARTMENT_OTHER): Payer: Self-pay

## 2020-02-05 ENCOUNTER — Emergency Department (HOSPITAL_BASED_OUTPATIENT_CLINIC_OR_DEPARTMENT_OTHER)
Admission: EM | Admit: 2020-02-05 | Discharge: 2020-02-05 | Disposition: A | Discharge status: Home / Self Care | Payer: Medicare Other | Attending: Emergency Medicine | Admitting: Emergency Medicine

## 2020-02-05 ENCOUNTER — Other Ambulatory Visit: Payer: Self-pay

## 2020-02-05 DIAGNOSIS — Y939 Activity, unspecified: Secondary | ICD-10-CM | POA: Insufficient documentation

## 2020-02-05 DIAGNOSIS — Y929 Unspecified place or not applicable: Secondary | ICD-10-CM | POA: Diagnosis not present

## 2020-02-05 DIAGNOSIS — S8991XA Unspecified injury of right lower leg, initial encounter: Secondary | ICD-10-CM

## 2020-02-05 DIAGNOSIS — Z794 Long term (current) use of insulin: Secondary | ICD-10-CM | POA: Diagnosis not present

## 2020-02-05 DIAGNOSIS — S80911A Unspecified superficial injury of right knee, initial encounter: Secondary | ICD-10-CM | POA: Insufficient documentation

## 2020-02-05 DIAGNOSIS — Z87891 Personal history of nicotine dependence: Secondary | ICD-10-CM | POA: Diagnosis not present

## 2020-02-05 DIAGNOSIS — I708 Atherosclerosis of other arteries: Secondary | ICD-10-CM | POA: Diagnosis not present

## 2020-02-05 DIAGNOSIS — M25561 Pain in right knee: Secondary | ICD-10-CM | POA: Diagnosis not present

## 2020-02-05 DIAGNOSIS — E1142 Type 2 diabetes mellitus with diabetic polyneuropathy: Secondary | ICD-10-CM | POA: Diagnosis not present

## 2020-02-05 DIAGNOSIS — X501XXA Overexertion from prolonged static or awkward postures, initial encounter: Secondary | ICD-10-CM | POA: Diagnosis not present

## 2020-02-05 DIAGNOSIS — I10 Essential (primary) hypertension: Secondary | ICD-10-CM | POA: Diagnosis not present

## 2020-02-05 DIAGNOSIS — Z79899 Other long term (current) drug therapy: Secondary | ICD-10-CM | POA: Insufficient documentation

## 2020-02-05 DIAGNOSIS — Z7982 Long term (current) use of aspirin: Secondary | ICD-10-CM | POA: Insufficient documentation

## 2020-02-05 DIAGNOSIS — Y999 Unspecified external cause status: Secondary | ICD-10-CM | POA: Insufficient documentation

## 2020-02-05 DIAGNOSIS — M7989 Other specified soft tissue disorders: Secondary | ICD-10-CM | POA: Diagnosis not present

## 2020-02-05 NOTE — ED Provider Notes (Signed)
[facility] EMERGENCY DEPARTMENT Provider Note   CSN: [id number] Arrival date & time: 02/05/20  7829     History Chief Complaint  Patient presents with  . Knee Pain    Scott Donaldson is a 69 y.o. male.  The history is provided by the patient, medical records and the spouse. No language interpreter was used.  Knee Pain Location:  Knee Time since incident:  2 days Lower extremity injury: possibly.   Knee location:  R knee Pain details:    Quality:  Aching and sharp   Radiates to:  Does not radiate   Severity:  Moderate   Onset quality:  Gradual   Duration:  2 days   Timing:  Constant   Progression:  Unchanged Chronicity:  New Dislocation: no   Foreign body present:  No foreign bodies Tetanus status:  Unknown Prior injury to area:  No Relieved by:  Nothing Worsened by:  Bearing weight, rotation and activity Ineffective treatments:  None tried Associated symptoms: no back pain, no decreased ROM, no fatigue, no fever, no itching, no muscle weakness, no neck pain, no numbness, no stiffness, no swelling and no tingling        Past Medical History:  Diagnosis Date  . Abdominal pain 08/04/2016  . Abnormal PSA 05/19/2015  . Arthralgia 11/12/2010  . Benign prostatic hyperplasia with urinary hesitancy 06/14/2018  . Cataract   . CELLULITIS, FOOT, RIGHT 07/02/2008   Qualifier: Diagnosis of  By: Marca Ancona RMA, Lucy    . Cough 08/04/2016  . Diabetes ([place]) 03/04/2007   Qualifier: Diagnosis of  By: Loanne Drilling MD, Jacelyn Pi   . DIABETES MELLITUS, TYPE II 03/04/2007  . Edema 07/02/2008   Qualifier: Diagnosis of  By: Marca Ancona RMA, Lucy    . Elevated LDL cholesterol level 06/14/2018  . Foot ulcer, right ([place]) 04/15/2014  . GERD (gastroesophageal reflux disease)   . HYPERCHOLESTEROLEMIA 07/29/2008  . HYPERTENSION 03/04/2007  . Knee pain, left 05/16/2015  . Loss of vision 04/15/2014  . Nonintractable episodic headache 12/01/2016  . Psoriasis   . SHOULDER PAIN, BILATERAL 01/12/2010    Qualifier: Diagnosis of  By: Loanne Drilling MD, Jacelyn Pi   . Swelling of left foot 08/05/2017  . 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 Bilateral carotid bruits 12/05/2019  . Type 2 diabetes mellitus with diabetic polyneuropathy, with long-term current use of insulin ([place]) 09/05/2019  . Diabetes mellitus ([place]) 09/05/2019  . Cervical radiculopathy 02/12/2019  . Slow transit constipation 02/12/2019  . DOE (dyspnea on exertion) 02/12/2019  . Chest pain 02/12/2019  . No-show for appointment 11/21/2018  . Coronary artery disease involving native heart 07/20/2018  . Bronchitis 07/20/2018  . Abnormal stress test   . Benign prostatic hyperplasia with urinary hesitancy 06/14/2018  . Elevated LDL cholesterol level 06/14/2018  . Swelling of left foot 08/05/2017  . Nonintractable episodic headache 12/01/2016  . Cough 08/04/2016  . Lower abdominal pain 08/04/2016  . Abnormal PSA 05/19/2015  . OA (osteoarthritis) of knee 05/16/2015  . Foot ulcer, right ([place]) 04/15/2014  . Loss of vision 04/15/2014  . Screening for prostate cancer 11/03/2012  . Routine general medical examination at a health care facility 11/03/2012  . Arthralgia 11/12/2010  . SHOULDER PAIN, BILATERAL 01/12/2010  . 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 HYPERCHOLESTEROLEMIA 07/29/2008  . CELLULITIS, FOOT, RIGHT 07/02/2008  . EDEMA 07/02/2008  .  Type 2 diabetes mellitus with hyperglycemia, with long-term current use of insulin ([place]) 03/04/2007  . Essential hypertension 03/04/2007    Past Surgical History:  Procedure Laterality Date  . COLONOSCOPY    . LEFT HEART CATH AND CORONARY ANGIOGRAPHY N/A 07/12/2018   Procedure: LEFT HEART CATH AND CORONARY ANGIOGRAPHY;  Surgeon: Wellington Hampshire, MD;  Location: [facility];  Service: Cardiovascular;  Laterality: N/A;  . stress cardiolite  05/30/2003       Family  History  Problem Relation Age of Onset  . Cancer Mother        Breast Cancer  . Cancer Father        uncertain type  . Diabetes Father   . Cancer Brother 24       Colon Cancer  . Colon cancer Neg Hx   . Rectal cancer Neg Hx   . Stomach cancer Neg Hx     Social History   Tobacco Use  . Smoking status: Former Smoker    Quit date: 07/26/1988    Years since quitting: 31.5  . Smokeless tobacco: Never Used  Vaping Use  . Vaping Use: Never used  Substance Use Topics  . Alcohol use: No  . Drug use: No    Home Medications Prior to Admission medications   Medication Sig Start Date End Date Taking? Authorizing Provider  aspirin EC 81 MG tablet Take 1 tablet (81 mg total) by mouth daily. 06/27/18   Josue Hector, MD  atorvastatin (LIPITOR) 40 MG tablet Take 1 tablet (40 mg total) by mouth daily. 08/29/19   [name], MD  dapagliflozin propanediol (FARXIGA) 5 MG TABS tablet Take 5 mg by mouth daily before breakfast. 12/06/19   Shamleffer, Melanie Crazier, MD  docusate sodium (COLACE) 100 MG capsule Take 1 capsule (100 mg total) by mouth 2 (two) times daily. Patient not taking: Reported on 07/23/2019 02/12/19   [name], MD  gabapentin (NEURONTIN) 300 MG capsule Take one at night for one week and then increase to one twice daily. 12/05/19   [name], MD  glucose blood test strip USE TO CHECK BLOOD SUGAR TWICE DAILY 10/02/18   Renato Shin, MD  insulin NPH-regular Human (70-30) 100 UNIT/ML injection Inject 36 Units into the skin 2 (two) times daily with a meal. 10/11/19   Shamleffer, Melanie Crazier, MD  metFORMIN (GLUCOPHAGE-XR) 500 MG 24 hr tablet Take 2 tablets (1,000 mg total) by mouth 2 (two) times daily. 12/06/19   Shamleffer, Melanie Crazier, MD  metoprolol succinate (TOPROL-XL) 50 MG 24 hr tablet Take 1 tablet (50 mg total) by mouth daily. Take with or immediately following a meal. 08/29/19   [name], MD  olmesartan (BENICAR) 20 MG tablet  TAKE 1 TABLET(20 MG) BY MOUTH DAILY 01/08/20   [name], MD  tamsulosin (FLOMAX) 0.4 MG CAPS capsule Take 0.4 mg by mouth daily. 11/19/19   [provider]    Allergies    Patient has no known allergies.  Review of Systems   Review of Systems  Constitutional: Negative for chills, diaphoresis, fatigue and fever.  HENT: Negative for congestion.   Respiratory: Negative for cough, chest tightness, shortness of breath and wheezing.   Cardiovascular: Negative for chest pain, palpitations and leg swelling.  Gastrointestinal: Negative for abdominal pain, constipation, diarrhea, nausea and vomiting.  Genitourinary: Negative for flank pain.  Musculoskeletal: Negative for back pain, neck pain, neck stiffness and stiffness.  Skin: Negative for itching, rash and  wound.  Neurological: Negative for headaches.  Psychiatric/Behavioral: Negative for agitation and confusion.  All other systems reviewed and are negative.   Physical Exam Updated Vital Signs BP (!) 184/71 (BP Location: Left Arm)   Pulse (!) 54   Temp 98.2 F (36.8 C) (Oral)   Resp 18   Ht 6\' 1"  (1.854 m)   Wt 92.1 kg   SpO2 99%   BMI 26.78 kg/m   Physical Exam Vitals and nursing note reviewed.  Constitutional:      General: He is not in acute distress.    Appearance: Normal appearance. He is well-developed. He is not ill-appearing, toxic-appearing or diaphoretic.  HENT:     Head: Normocephalic and atraumatic.     Nose: No congestion or rhinorrhea.  Eyes:     Conjunctiva/sclera: Conjunctivae normal.  Cardiovascular:     Rate and Rhythm: Normal rate and regular rhythm.     Pulses: Normal pulses.     Heart sounds: No murmur heard.   Pulmonary:     Effort: Pulmonary effort is normal. No respiratory distress.     Breath sounds: Normal breath sounds. No wheezing, rhonchi or rales.  Chest:     Chest wall: No tenderness.  Abdominal:     General: Abdomen is flat.     Palpations: Abdomen is soft.      Tenderness: There is no abdominal tenderness. There is no right CVA tenderness, left CVA tenderness, guarding or rebound.  Musculoskeletal:        General: Tenderness present. No swelling or deformity.     Cervical back: Neck supple. No tenderness.     Right knee: No swelling, deformity, effusion, erythema, ecchymosis, lacerations or crepitus. Normal range of motion. Tenderness present over the medial joint line. Normal alignment. Normal pulse.     Right lower leg: Normal. No swelling, lacerations or tenderness. No edema.     Left lower leg: No edema.       Legs:  Skin:    General: Skin is warm and dry.     Capillary Refill: Capillary refill takes less than 2 seconds.     Findings: No erythema or rash.  Neurological:     General: No focal deficit present.     Mental Status: He is alert and oriented to person, place, and time.  Psychiatric:        Mood and Affect: Mood normal.     ED Results / Procedures / Treatments   Labs (all labs ordered are listed, but only abnormal results are displayed) Labs Reviewed - No data to display  EKG None  Radiology DG Knee Complete 4 Views Right  Result Date: 02/05/2020 CLINICAL DATA:  Pain following twisting injury EXAM: RIGHT KNEE - COMPLETE 4+ VIEW COMPARISON:  None. FINDINGS: Frontal, lateral, and bilateral oblique views were obtained. There is soft tissue swelling anteriorly. There is no appreciable fracture or dislocation. No evident joint effusion. Joint spaces appear normal. There is a spur along the anterior superior patella. No erosive change. There are multiple foci of arterial vascular calcification. IMPRESSION: Soft tissue swelling anteriorly. No fracture, dislocation, or effusion. No appreciable joint space narrowing. A spur along the anterior superior patella likely represents distal quadriceps tendinosis. Foci of arterial atherosclerotic vascular calcification noted. Electronically Signed   By: Lowella Grip III M.D.   On:  02/05/2020 10:59    Procedures Procedures (including critical care time)  Medications Ordered in ED Medications - No data to display  ED Course  I have  reviewed the triage vital signs and the nursing notes.  Pertinent labs & imaging results that were available during my care of the patient were reviewed by me and considered in my medical decision making (see chart for details).    MDM Rules/Calculators/A&P                          Scott Donaldson is a 69 y.o. male with a past medical history significant for diabetes, hypertension, hypercholesterolemia, arthritis of the left knee, and GERD who presents with right knee pain.  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  He reports that throughout the day it continued to worsen and today he has had pain with ambulation.  He reports the pain is moderate to severe.  He denies any swelling or redness.  No significant numbness, tingling, or weakness distally.  He denies any history of DVTs.  He denies any calf pain or calf swelling.  Denies any more proximal pain in the hip or pelvis.  Denies any chest pain, shortness of breath, or other symptoms.  No fevers, chills, congestion, cough, urinary symptoms, or GI symptoms.  He is just having the right knee pain since yesterday.  No history of gout or infected joint.  He reports it feels similar when he had arthritis pain in his left knee several years ago.  On exam, patient does have tenderness in the medial side of theright knee and the posterior right knee.  No tenderness in the calf.  Normal pulses, sensation, and strength distally.  Normal exam of the hip.  Patient is able to bend it but has more pain with palpation than flexion and extension.  No swelling seen on external exam.  No overlying rash.  No warmth.  Otherwise, lungs clear and chest nontender.  Abdomen nontender.  Patient  otherwise well-appearing.  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  He does agree that he may have hurt yesterday.  Low concern for a septic joint or other infection at this time.  Low concern for DVT given the description of symptoms.  We will get x-rays as we discussed together and if it is negative, anticipate placement of knee immobilizer, crutches, have him follow-up with his orthopedist who is take care of his other knee.  We agreed to hold on ultrasound, labs, or aspiration at this time.  Patient is agreement with plan of care and will get x-rays.  Anticipate reassessment after x-rays.  12:12 PM X-ray returned showing some soft tissue swelling but otherwise no fracture dislocation or effusion.  No spur was seen.  I continue to suspect a ligamentous or meniscus injury based on his description of twisting and hurting.  He will be placed in knee immobilizer and given crutches.  He will follow-up with sports medicine or his previous orthopedist team.  He does not want a prescription for stronger pain medicine and will use over-the-counter medications.  This was felt reasonable plan.  He understands return precautions and follow-up instructions and was discharged in good condition.   Final Clinical Impression(s) / ED Diagnoses Final diagnoses:  Acute pain of right knee  Injury of right knee,  initial encounter    Rx / DC Orders ED Discharge Orders    None      Clinical Impression: 1. Acute pain of right knee   2. Injury of right knee, initial encounter     Disposition: Discharge  Condition: Good  I have discussed the results, Dx and Tx plan with the pt(& family if present). He/she/they expressed understanding and agree(s) with the plan. Discharge instructions discussed at great length. Strict return precautions discussed and pt  &/or family have verbalized understanding of the instructions. No further questions at time of discharge.    New Prescriptions   No medications on file    Follow Up: Rosemarie Ax, MD [place] [id number] [phone]     [facility] [place] [facility]GENCY DEPARTMENT [address] [id number] E[place] [address] [phone]    your previous orthopedic surgeon.        Autumnrose Yore, Gwenyth Allegra, MD 02/05/20 1215

## 2020-02-05 NOTE — ED Triage Notes (Signed)
Pt arrives with right knee pain X2 days. 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

## 2020-02-05 NOTE — Discharge Instructions (Signed)
Your history and exam today are consistent with a injury to your right knee of likely meniscus or ligament.  Please use the knee immobilizer and crutches to help with your symptoms and use over-the-counter pain medication.  You may also use rest, ice, and elevation help with the discomfort.  We had a discussion and we agree that this is less likely a blood clot, infection, or more concerning injury at this time.  Please follow-up as we discussed with your orthopedist or sports medicine and if any symptoms change or worsen, please return to the nearest emergency department.

## 2020-02-05 NOTE — ED Notes (Signed)
ED Provider at bedside. 

## 2020-02-19 ENCOUNTER — Telehealth: Payer: Self-pay | Admitting: Family Medicine

## 2020-02-19 NOTE — Telephone Encounter (Signed)
Left msg w/daughter for pt to call office--- ER referral to Dr. Raeford Razor.  --glh

## 2020-02-20 ENCOUNTER — Telehealth: Payer: Self-pay

## 2020-02-20 DIAGNOSIS — E1142 Type 2 diabetes mellitus with diabetic polyneuropathy: Secondary | ICD-10-CM

## 2020-02-20 DIAGNOSIS — E7800 Pure hypercholesterolemia, unspecified: Secondary | ICD-10-CM

## 2020-02-20 DIAGNOSIS — Z794 Long term (current) use of insulin: Secondary | ICD-10-CM

## 2020-02-20 NOTE — Progress Notes (Addendum)
02/21/2020 Name: Scott Donaldson MRN: [id number] DOB: Feb 16, 1951 [name] is a 69 y.o. year old male who is a primary care patient of [name], MD.   Recent Relevant Labs: Lab Results  Component Value Date/Time   HGBA1C 9.3 (H) 12/05/2019 11:17 AM   HGBA1C 10.4 (H) 07/23/2019 08:59 AM   MICROALBUR 40.5 (H) 12/05/2019 11:17 AM   MICROALBUR 37.8 (H) 07/23/2019 08:59 AM    Kidney Function Lab Results  Component Value Date/Time   CREATININE 0.93 12/05/2019 11:17 AM   CREATININE 0.99 07/23/2019 08:59 AM   GFR 80.43 12/05/2019 11:17 AM   GFRNONAA 81 07/06/2018 11:29 AM   GFRAA 94 07/06/2018 11:29 AM   . Current antihyperglycemic regimen:  o Insulin NPH-regular Human (70-30)  30 units breakfast, 35 units supper.  o Metformin (GLUCOPHAGE-XR) 1000 MG BID o Patient daughter states he does not take [place] 5mg  . What recent interventions/DTPs have been made to improve glycemic control:  o None ID . Have there been any recent hospitalizations or ED visits since last visit with CPP? Yes . Patient reports vomiting and Lightheadedness , dizziness  hypoglycemic symptoms, including Pale, Sweaty, Shaky, Hungry, Nervous/irritable and Vision changes   - A couple of weeks ago patient states that he woke up around 2 a.m. sweaty, his blood sugar ranges from 54 to 60 when this happens; patients states that this happens about 2 to 3 times a week..  . Patient denies vomiting, abdominal pain, fussiness, diarrhea, cough and difficulty breathing hyperglycemic symptoms, including blurry vision and polyuria   - Patients reports on 02/20/2020 he was driving home and when he removed his sunglasses that his vision was black and white and burry.  . How often are you checking your blood sugar? 3-4 times daily . What are your blood sugars ranging?  - 02/20/2020 Blood sugar readings- o Fasting: 196 o Before meals: 196 o After meals: 66 o Bedtime: Haven't check at bed time . During the  week, how often does your blood glucose drop below 70? Patient states his blood sugar drops low almost everyday. Patient reports his sugar level on 02/15/2020 being 66.   [name] Patient daughter is concern of his blood sugar dropping this low (54-60), she states patient gets lightheaded and pale to where he almost passes out. She gives him something sweet when this happens.   . Are you checking your feet daily/regularly?  o Yes , patient reports he is having feeling in both feet no  numbness or tingling sensation.  Adherence Review: Is the patient currently on a STATIN medication? Yes Is the patient currently on ACE/ARB medication? Yes Does the patient have >5 day gap between last estimated fill dates? No   Anderson Malta Clinical Pharmacist Assistant [phone]   Maryjean Ka

## 2020-02-25 ENCOUNTER — Other Ambulatory Visit: Payer: Self-pay

## 2020-02-25 ENCOUNTER — Ambulatory Visit (INDEPENDENT_AMBULATORY_CARE_PROVIDER_SITE_OTHER): Payer: Medicare Other | Admitting: Family Medicine

## 2020-02-25 ENCOUNTER — Encounter: Payer: Self-pay | Admitting: Family Medicine

## 2020-02-25 VITALS — BP 150/70 | HR 79 | Ht 71.0 in | Wt 202.0 lb

## 2020-02-25 DIAGNOSIS — M10061 Idiopathic gout, right knee: Secondary | ICD-10-CM | POA: Insufficient documentation

## 2020-02-25 DIAGNOSIS — M1712 Unilateral primary osteoarthritis, left knee: Secondary | ICD-10-CM

## 2020-02-25 MED ORDER — COLCHICINE 0.6 MG PO TABS
0.6000 mg | ORAL_TABLET | Freq: Two times a day (BID) | ORAL | 2 refills | Status: DC
Start: 2020-02-25 — End: 2020-04-17

## 2020-02-25 NOTE — Patient Instructions (Signed)
Good to see you Please try ice  Please take the lipitor every other day while taking the colchicine  Please let me know if your pain hasn't improved after two days of the colchicine.  I will call with the results from today   Please send me a message in MyChart with any questions or updates.  Please see me back in 2-3 weeks.   --Dr. Raeford Razor

## 2020-02-25 NOTE — Progress Notes (Signed)
Scott Donaldson - 69 y.o. male MRN [id number]  Date of birth: 1951-03-29  SUBJECTIVE:  Including CC & ROS.  Chief Complaint  Patient presents with  . Knee Pain    bilateral / right worse    Scott Donaldson is a 69 y.o. male that is presenting with acute knee pain.  He is having pain over the right and left knee.  The medial aspect of the right knee seems to be worse.  Denies any specific inciting event.  Having some warmth and severely tender to the touch.  Having throbbing at night..  Independent review of the right knee x-ray from 7/13 shows quadriceps tendon spurring and chronic degenerative changes.   Review of Systems See HPI   HISTORY: Past Medical, Surgical, Social, and Family History Reviewed & Updated per EMR.   Pertinent Historical Findings include:  Past Medical History:  Diagnosis Date  . Abdominal pain 08/04/2016  . Abnormal PSA 05/19/2015  . Arthralgia 11/12/2010  . Benign prostatic hyperplasia with urinary hesitancy 06/14/2018  . Cataract   . CELLULITIS, FOOT, RIGHT 07/02/2008   Qualifier: Diagnosis of  By: Marca Ancona RMA, Lucy    . Cough 08/04/2016  . Diabetes ([place]) 03/04/2007   Qualifier: Diagnosis of  By: Loanne Drilling MD, Jacelyn Pi   . DIABETES MELLITUS, TYPE II 03/04/2007  . Edema 07/02/2008   Qualifier: Diagnosis of  By: Marca Ancona RMA, Lucy    . Elevated LDL cholesterol level 06/14/2018  . Foot ulcer, right ([place]) 04/15/2014  . GERD (gastroesophageal reflux disease)   . HYPERCHOLESTEROLEMIA 07/29/2008  . HYPERTENSION 03/04/2007  . Knee pain, left 05/16/2015  . Loss of vision 04/15/2014  . Nonintractable episodic headache 12/01/2016  . Psoriasis   . SHOULDER PAIN, BILATERAL 01/12/2010   Qualifier: Diagnosis of  By: Loanne Drilling MD, Jacelyn Pi   . Swelling of left foot 08/05/2017  . 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 COLONOSCOPY    . LEFT HEART CATH AND CORONARY ANGIOGRAPHY N/A  07/12/2018   Procedure: LEFT HEART CATH AND CORONARY ANGIOGRAPHY;  Surgeon: Wellington Hampshire, MD;  Location: [facility];  Service: Cardiovascular;  Laterality: N/A;  . stress cardiolite  05/30/2003    Family History  Problem Relation Age of Onset  . Cancer Mother        Breast Cancer  . Cancer Father        uncertain type  . Diabetes Father   . Cancer Brother 40       Colon Cancer  . Colon cancer Neg Hx   . Rectal cancer Neg Hx   . Stomach cancer Neg Hx     Social History   Socioeconomic History  . Marital status: Single    Spouse name: Not on file  . Number of children: 2  . Years of education: 10  . Highest education level: Not on file  Occupational History  . Occupation: roofer  Tobacco Use  . Smoking status: Former Smoker    Quit date: 07/26/1988    Years since quitting: 31.6  . Smokeless tobacco: Never Used  Vaping Use  . Vaping Use: Never used  Substance and Sexual Activity  . Alcohol use: No  . Drug use: No  . Sexual activity: Not on file  Other Topics Concern  . Not on file  Social History Narrative   Lives with daughter in a  one story home.  Has 2 children.  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  Education: 10th grade.    Social Determinants of Health   Financial Resource Strain:   . Difficulty of Paying Living Expenses:   Food Insecurity:   . Worried About Charity fundraiser in the Last Year:   . Arboriculturist in the Last Year:   Transportation Needs:   . Film/video editor (Medical):   [name] Lack of Transportation (Non-Medical):   Physical Activity:   . Days of Exercise per Week:   . Minutes of Exercise per Session:   Stress:   . Feeling of Stress :   Social Connections:   . Frequency of Communication with Friends and Family:   . Frequency of Social Gatherings with Friends and Family:   . Attends Religious Services:   . Active Member of Clubs or Organizations:   . Attends Archivist Meetings:   [name] Marital Status:   Intimate  Partner Violence:   . Fear of Current or Ex-Partner:   . Emotionally Abused:   [name] Physically Abused:   . Sexually Abused:      PHYSICAL EXAM:  VS: BP (!) 150/70   Pulse 79   Ht 5\' 11"  (1.803 m)   Wt 202 lb (91.6 kg)   BMI 28.17 kg/m  Physical Exam Gen: NAD, alert, cooperative with exam, well-appearing MSK:  Right and left knee: Severely tender to the medial joint space of the right knee. Back tender to palpation at the insertion of the patellar tendon on the right knee. Mild effusion of the right knee. No effusion of the left knee. Normal range of motion. Neurovascularly intact     ASSESSMENT & PLAN:   OA (osteoarthritis) of knee Pain of the left knee could be more degenerative as opposed to gouty related.  It is less severe than the right.  May not get much relief with the colchicine.   -Counseled on supportive care. -Could consider Indocin if not much relief with interesting. -His last A1c was 9.3.  Could consider 1 injection at a time.  Acute idiopathic gout of right knee The pain in the right knee and appears to be more gouty related as opposed to degenerative.  It appears to have chondrocalcinosis on his x-ray.  His pain was exquisitely tender to touch with no inciting event. -Counseled on supportive care. -Uric acid. -Colchicine.  Counseled on taking Lipitor.  May need to switch to Indocin if limited improvement.

## 2020-02-25 NOTE — Assessment & Plan Note (Addendum)
The pain in the right knee and appears to be more gouty related as opposed to degenerative.  It appears to have chondrocalcinosis on his x-ray.  His pain was exquisitely tender to touch with no inciting event. -Counseled on supportive care. -Uric acid. -Colchicine.  Counseled on taking Lipitor.  May need to switch to Indocin if limited improvement.

## 2020-02-25 NOTE — Assessment & Plan Note (Signed)
Pain of the left knee could be more degenerative as opposed to gouty related.  It is less severe than the right.  May not get much relief with the colchicine.   -Counseled on supportive care. -Could consider Indocin if not much relief with interesting. -His last A1c was 9.3.  Could consider 1 injection at a time.

## 2020-02-25 NOTE — Progress Notes (Signed)
Plan:  Recommend decreasing Relion 70/30 to 30 units twice daily due to frequent hypoglycemia.  Recommend switching patient to basal + bolus insulin regimen due to superior efficacy and safety and pursuing PAP to help with financial burden.   Doristine Section Clinical Pharmacist [facility]  [phone]

## 2020-02-26 ENCOUNTER — Telehealth: Payer: Self-pay | Admitting: Family Medicine

## 2020-02-26 LAB — URIC ACID: Uric Acid: 8 mg/dL (ref 3.8–8.4)

## 2020-02-26 NOTE — Telephone Encounter (Signed)
Left VM for patient. If he calls back please have him speak with a nurse/CMA and inform that his uric acid was elevated. We can start allopurinol once the knee pain has improved.   If any questions then please take the best time and phone number to call and I will try to call him back.   Rosemarie Ax, MD [facility] 02/26/2020, 9:11 AM

## 2020-02-27 ENCOUNTER — Telehealth: Payer: Self-pay

## 2020-02-27 NOTE — Progress Notes (Signed)
Spoke with patient daughter to informed her the clinical pharmacist Daron Offer   spoke with Dr. Kelton Pillar (endocrinologist) and she wants him to decrease his insulin to 30 units with breakfast and 30 units with supper because of his frequent low blood sugars. Per Clinical Pharmacist  to make sure to take his insulin 30 minutes before he eats a meal.  Patient daughter Scott Donaldson verbalized understanding.  [place] Pharmacist Assistant [phone]

## 2020-03-07 ENCOUNTER — Encounter: Payer: Self-pay | Admitting: Family Medicine

## 2020-03-07 ENCOUNTER — Ambulatory Visit (INDEPENDENT_AMBULATORY_CARE_PROVIDER_SITE_OTHER): Payer: Medicare Other | Admitting: Family Medicine

## 2020-03-07 ENCOUNTER — Other Ambulatory Visit: Payer: Self-pay

## 2020-03-07 VITALS — BP 118/64 | HR 52 | Temp 98.4°F | Ht 71.0 in | Wt 200.8 lb

## 2020-03-07 DIAGNOSIS — M25561 Pain in right knee: Secondary | ICD-10-CM

## 2020-03-07 MED ORDER — DICLOFENAC SODIUM 1 % EX GEL: CUTANEOUS | 1 refills | Status: DC

## 2020-03-07 MED ORDER — TRAMADOL HCL 50 MG PO TABS: ORAL_TABLET | ORAL | 0 refills | Status: DC

## 2020-03-07 NOTE — Progress Notes (Signed)
Established Patient Office Visit  Subjective:  Patient ID: Scott Donaldson, male    DOB: 1950/10/28  Age: 69 y.o. MRN: [id number]  CC:  Chief Complaint  Patient presents with   Follow-up    3 month follow up    HPI Scott Donaldson presents for follow-up of right knee pain.  Patient denies any kind of an injury.  Knee is been acutely painful especially at night.  There is never been any erythema swelling or injury history.  Denies locking or giving way.  Uric acid level was elevated.  No family history of gout.  He has been on colchicine twice daily for the last few weeks without benefit.  It has not helped.  Past Medical History:  Diagnosis Date   Abdominal pain 08/04/2016   Abnormal PSA 05/19/2015   Arthralgia 11/12/2010   Benign prostatic hyperplasia with urinary hesitancy 06/14/2018   Cataract    CELLULITIS, FOOT, RIGHT 07/02/2008   Qualifier: Diagnosis of  By: Marca Ancona RMA, Lucy     Cough 08/04/2016   Diabetes ([place]) 03/04/2007   Qualifier: Diagnosis of  By: Loanne Drilling MD, Sean A    DIABETES MELLITUS, TYPE II 03/04/2007   Edema 07/02/2008   Qualifier: Diagnosis of  By: Marca Ancona RMA, Lucy     Elevated LDL cholesterol level 06/14/2018   Foot ulcer, right ([place]) 04/15/2014   GERD (gastroesophageal reflux disease)    HYPERCHOLESTEROLEMIA 07/29/2008   HYPERTENSION 03/04/2007   Knee pain, left 05/16/2015   Loss of vision 04/15/2014   Nonintractable episodic headache 12/01/2016   Psoriasis    SHOULDER PAIN, BILATERAL 01/12/2010   Qualifier: Diagnosis of  By: Loanne Drilling MD, Sean A    Swelling of left foot 08/05/2017   TUBULOVILLOUS ADENOMA, 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   Qualifier: Diagnosis of  By: Loanne Drilling MD, Jacelyn Pi     Past Surgical History:  Procedure Laterality Date   COLONOSCOPY     LEFT HEART CATH AND CORONARY ANGIOGRAPHY N/A 07/12/2018   Procedure: LEFT HEART CATH AND CORONARY ANGIOGRAPHY;  Surgeon: Wellington Hampshire, MD;  Location: [place] [facility];  Service: Cardiovascular;   Laterality: N/A;   stress cardiolite  05/30/2003    Family History  Problem Relation Age of Onset   Cancer Mother        Breast Cancer   Cancer Father        uncertain type   Diabetes Father    Cancer Brother 31       Colon Cancer   Colon cancer Neg Hx    Rectal cancer Neg Hx    Stomach cancer Neg Hx     Social History   Socioeconomic History   Marital status: Single    Spouse name: Not on file   Number of children: 2   Years of education: 10   Highest education level: Not on file  Occupational History   Occupation: roofer  Tobacco Use   Smoking status: Former Smoker    Quit date: 07/26/1988    Years since quitting: 31.6   Smokeless tobacco: Never Used  Scientific laboratory technician Use: Never used  Substance and Sexual Activity   Alcohol use: No   Drug use: No   Sexual activity: Not on file  Other Topics Concern   Not on file  Social History Narrative   Lives with daughter in a one story home.  Has 2 children.  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  Education: 10th grade.    Social Determinants  of Health   Financial Resource Strain:    Difficulty of Paying Living Expenses:   Food Insecurity:    Worried About Charity fundraiser in the Last Year:    Arboriculturist in the Last Year:   Transportation Needs:    Film/video editor (Medical):    Lack of Transportation (Non-Medical):   Physical Activity:    Days of Exercise per Week:    Minutes of Exercise per Session:   Stress:    Feeling of Stress :   Social Connections:    Frequency of Communication with Friends and Family:    Frequency of Social Gatherings with Friends and Family:    Attends Religious Services:    Active Member of Clubs or Organizations:    Attends Music therapist:    Marital Status:   Intimate Partner Violence:    Fear of Current or Ex-Partner:    Emotionally Abused:    Physically Abused:    Sexually Abused:     Outpatient Medications  Prior to Visit  Medication Sig Dispense Refill   aspirin EC 81 MG tablet Take 1 tablet (81 mg total) by mouth daily.     atorvastatin (LIPITOR) 40 MG tablet Take 1 tablet (40 mg total) by mouth daily. 90 tablet 3   colchicine 0.6 MG tablet Take 1 tablet (0.6 mg total) by mouth 2 (two) times daily. 60 tablet 2   dapagliflozin propanediol (FARXIGA) 5 MG TABS tablet Take 5 mg by mouth daily before breakfast. 30 tablet 4   gabapentin (NEURONTIN) 300 MG capsule Take one at night for one week and then increase to one twice daily. 60 capsule 3   glucose blood test strip USE TO CHECK BLOOD SUGAR TWICE DAILY 100 each 12   insulin NPH-regular Human (70-30) 100 UNIT/ML injection Inject 36 Units into the skin 2 (two) times daily with a meal. 10 mL 11   metFORMIN (GLUCOPHAGE-XR) 500 MG 24 hr tablet Take 2 tablets (1,000 mg total) by mouth 2 (two) times daily. 360 tablet 3   metoprolol succinate (TOPROL-XL) 50 MG 24 hr tablet Take 1 tablet (50 mg total) by mouth daily. Take with or immediately following a meal. 90 tablet 3   olmesartan (BENICAR) 20 MG tablet TAKE 1 TABLET(20 MG) BY MOUTH DAILY 90 tablet 0   tamsulosin (FLOMAX) 0.4 MG CAPS capsule Take 0.4 mg by mouth daily.     docusate sodium (COLACE) 100 MG capsule Take 1 capsule (100 mg total) by mouth 2 (two) times daily. (Patient not taking: Reported on 07/23/2019) 10 capsule 0   No facility-administered medications prior to visit.    No Known Allergies  ROS Review of Systems  Constitutional: Negative.   Respiratory: Negative.   Cardiovascular: Negative.   Gastrointestinal: Negative.   Musculoskeletal: Positive for arthralgias and gait problem.  Neurological: Negative for weakness and numbness.  Psychiatric/Behavioral: Negative.       Objective:    Physical Exam Vitals and nursing note reviewed.  Constitutional:      General: He is not in acute distress.    Appearance: Normal appearance. He is not ill-appearing,  toxic-appearing or diaphoretic.  HENT:     Head: Normocephalic and atraumatic.  Eyes:     General: No scleral icterus.       Right eye: No discharge.        Left eye: No discharge.     Conjunctiva/sclera: Conjunctivae normal.  Pulmonary:     Effort: Pulmonary effort  is normal.  Musculoskeletal:     Right knee: No swelling, deformity, effusion or erythema. Decreased range of motion. Tenderness present over the medial joint line.  Skin:    General: Skin is warm and dry.  Neurological:     Mental Status: He is alert and oriented to person, place, and time.  Psychiatric:        Mood and Affect: Mood normal.        Behavior: Behavior normal.     BP 118/64    Pulse (!) 52    Temp 98.4 F (36.9 C) (Tympanic)    Ht 5\' 11"  (1.803 m)    Wt 200 lb 12.8 oz (91.1 kg)    SpO2 97%    BMI 28.01 kg/m  Wt Readings from Last 3 Encounters:  03/07/20 200 lb 12.8 oz (91.1 kg)  02/25/20 202 lb (91.6 kg)  02/05/20 203 lb (92.1 kg)     Health Maintenance Due  Topic Date Due   OPHTHALMOLOGY EXAM  07/18/2019   FOOT EXAM  09/08/2019   INFLUENZA VACCINE  02/24/2020    There are no preventive care reminders to display for this patient.  Lab Results  Component Value Date   TSH 1.18 08/04/2016   Lab Results  Component Value Date   WBC 5.6 12/05/2019   HGB 13.3 12/05/2019   HCT 38.0 (L) 12/05/2019   MCV 85.2 12/05/2019   PLT 175.0 12/05/2019   Lab Results  Component Value Date   NA 138 12/05/2019   K 4.7 12/05/2019   CO2 26 12/05/2019   GLUCOSE 235 (H) 12/05/2019   BUN 29 (H) 12/05/2019   CREATININE 0.93 12/05/2019   BILITOT 0.4 12/05/2019   ALKPHOS 69 12/05/2019   AST 15 12/05/2019   ALT 14 12/05/2019   PROT 7.4 12/05/2019   ALBUMIN 4.3 12/05/2019   CALCIUM 10.1 12/05/2019   GFR 80.43 12/05/2019   Lab Results  Component Value Date   CHOL 198 07/23/2019   Lab Results  Component Value Date   HDL 42.60 07/23/2019   Lab Results  Component Value Date   LDLCALC 117 (H)  07/23/2019   Lab Results  Component Value Date   TRIG 191.0 (H) 07/23/2019   Lab Results  Component Value Date   CHOLHDL 5 07/23/2019   Lab Results  Component Value Date   HGBA1C 9.3 (H) 12/05/2019      Assessment & Plan:   Problem List Items Addressed This Visit      Other   Right knee pain - Primary   Relevant Medications   diclofenac Sodium (VOLTAREN) 1 % GEL   traMADol (ULTRAM) 50 MG tablet   Other Relevant Orders   Ambulatory referral to Orthopedic Surgery      Meds ordered this encounter  Medications   diclofenac Sodium (VOLTAREN) 1 % GEL    Sig: Apply a small grape sized dollop to tender space of right knee up to 4 times daily.    Dispense:  150 g    Refill:  1   traMADol (ULTRAM) 50 MG tablet    Sig: Take one at night as needed.    Dispense:  15 tablet    Refill:  0    Follow-up: Return in about 3 months (around 06/07/2020).   Pending orthopedic consultation, may start treatment for gout.  Has taken colchicine without benefit though.  We will follow-up for hypertension elevated cholesterol also at that time. [name], MD

## 2020-03-10 ENCOUNTER — Ambulatory Visit: Payer: Medicare Other | Admitting: Family Medicine

## 2020-03-10 NOTE — Progress Notes (Deleted)
BRODE SCULLEY - 69 y.o. male MRN [id number]  Date of birth: [birth year]  SUBJECTIVE:  Including CC & ROS.  No chief complaint on file.   VALENTIN BENNEY is a 69 y.o. male that is  ***.  ***   Review of Systems See HPI   HISTORY: Past Medical, Surgical, Social, and Family History Reviewed & Updated per EMR.   Pertinent Historical Findings include:  Past Medical History:  Diagnosis Date  . Abdominal pain 08/04/2016  . Abnormal PSA 05/19/2015  . Arthralgia 11/12/2010  . Benign prostatic hyperplasia with urinary hesitancy 06/14/2018  . Cataract   . CELLULITIS, FOOT, RIGHT 07/02/2008   Qualifier: Diagnosis of  By: Marca Ancona RMA, Lucy    . Cough 08/04/2016  . Diabetes ([place]) 03/04/2007   Qualifier: Diagnosis of  By: Loanne Drilling MD, Jacelyn Pi   . DIABETES MELLITUS, TYPE II 03/04/2007  . Edema 07/02/2008   Qualifier: Diagnosis of  By: Marca Ancona RMA, Lucy    . Elevated LDL cholesterol level 06/14/2018  . Foot ulcer, right ([place]) 04/15/2014  . GERD (gastroesophageal reflux disease)   . HYPERCHOLESTEROLEMIA 07/29/2008  . HYPERTENSION 03/04/2007  . Knee pain, left 05/16/2015  . Loss of vision 04/15/2014  . Nonintractable episodic headache 12/01/2016  . Psoriasis   . SHOULDER PAIN, BILATERAL 01/12/2010   Qualifier: Diagnosis of  By: Loanne Drilling MD, Jacelyn Pi   . Swelling of left foot 08/05/2017  . 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 COLONOSCOPY    . LEFT HEART CATH AND CORONARY ANGIOGRAPHY N/A 07/12/2018   Procedure: LEFT HEART CATH AND CORONARY ANGIOGRAPHY;  Surgeon: Wellington Hampshire, MD;  Location: [place] [facility];  Service: Cardiovascular;  Laterality: N/A;  . stress cardiolite  05/30/2003    Family History  Problem Relation Age of Onset  . Cancer Mother        Breast Cancer  . Cancer Father        uncertain type  . Diabetes Father   . Cancer Brother 30       Colon Cancer  . Colon cancer  Neg Hx   . Rectal cancer Neg Hx   . Stomach cancer Neg Hx     Social History   Socioeconomic History  . Marital status: Single    Spouse name: Not on file  . Number of children: 2  . Years of education: 10  . Highest education level: Not on file  Occupational History  . Occupation: roofer  Tobacco Use  . Smoking status: Former Smoker    Quit date: 07/26/1988    Years since quitting: 31.6  . Smokeless tobacco: Never Used  Vaping Use  . Vaping Use: Never used  Substance and Sexual Activity  . Alcohol use: No  . Drug use: No  . Sexual activity: Not on file  Other Topics Concern  . Not on file  Social History Narrative   Lives with daughter in a one story home.  Has 2 children.  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  Education: 10th grade.    Social Determinants of Health   Financial Resource Strain:   . Difficulty of Paying Living Expenses:   Food Insecurity:   . Worried About Charity fundraiser in the Last Year:   . Arboriculturist in the Last Year:   Transportation Needs:   . Lack of Transportation (  Medical):   [name] Lack of Transportation (Non-Medical):   Physical Activity:   . Days of Exercise per Week:   . Minutes of Exercise per Session:   Stress:   . Feeling of Stress :   Social Connections:   . Frequency of Communication with Friends and Family:   . Frequency of Social Gatherings with Friends and Family:   . Attends Religious Services:   . Active Member of Clubs or Organizations:   . Attends Archivist Meetings:   [name] Marital Status:   Intimate Partner Violence:   . Fear of Current or Ex-Partner:   . Emotionally Abused:   [name] Physically Abused:   . Sexually Abused:      PHYSICAL EXAM:  VS: There were no vitals taken for this visit. Physical Exam Gen: NAD, alert, cooperative with exam, well-appearing MSK:  ***      ASSESSMENT & PLAN:   No problem-specific Assessment & Plan notes found for this encounter.

## 2020-03-17 ENCOUNTER — Ambulatory Visit: Payer: Medicare Other | Admitting: Surgical

## 2020-03-25 ENCOUNTER — Telehealth: Payer: Self-pay | Admitting: Family Medicine

## 2020-03-25 ENCOUNTER — Encounter: Payer: Self-pay | Admitting: Family Medicine

## 2020-03-25 NOTE — Telephone Encounter (Signed)
Spoke with patients daughter who states that she would call EMS to evaluate patient do to symptoms.

## 2020-03-25 NOTE — Telephone Encounter (Signed)
Spoke with patients daughter who states that she was advised to go ahead and call EMS to come evaluate patient and go from there. Agreed with triage nurse and daughter states that she would call right away.

## 2020-03-25 NOTE — Telephone Encounter (Signed)
Patient scheduled an appointment on 09/02 via MyChart to be seen for dizziness/weakness and has been in the bed for a week. Called patient and advised him that I would need to transfer him to the nurse triage line for further evaluation. Patient understood and call was transferred.

## 2020-03-26 ENCOUNTER — Other Ambulatory Visit: Payer: Self-pay

## 2020-03-27 ENCOUNTER — Encounter: Payer: Self-pay | Admitting: Family Medicine

## 2020-03-27 ENCOUNTER — Encounter (HOSPITAL_BASED_OUTPATIENT_CLINIC_OR_DEPARTMENT_OTHER): Payer: Self-pay | Admitting: Emergency Medicine

## 2020-03-27 ENCOUNTER — Emergency Department (HOSPITAL_BASED_OUTPATIENT_CLINIC_OR_DEPARTMENT_OTHER): Payer: Medicare Other

## 2020-03-27 ENCOUNTER — Ambulatory Visit (INDEPENDENT_AMBULATORY_CARE_PROVIDER_SITE_OTHER): Payer: Medicare Other | Admitting: Family Medicine

## 2020-03-27 ENCOUNTER — Inpatient Hospital Stay (HOSPITAL_BASED_OUTPATIENT_CLINIC_OR_DEPARTMENT_OTHER)
Admission: EM | Admit: 2020-03-27 | Discharge: 2020-03-29 | DRG: 683 | Disposition: A | Discharge status: Home / Self Care | Payer: Medicare Other | Attending: Internal Medicine | Admitting: Internal Medicine

## 2020-03-27 VITALS — BP 100/58 | HR 56 | Temp 98.0°F | Ht 71.0 in | Wt 186.4 lb

## 2020-03-27 DIAGNOSIS — E1165 Type 2 diabetes mellitus with hyperglycemia: Secondary | ICD-10-CM | POA: Diagnosis not present

## 2020-03-27 DIAGNOSIS — N401 Enlarged prostate with lower urinary tract symptoms: Secondary | ICD-10-CM | POA: Diagnosis present

## 2020-03-27 DIAGNOSIS — R1031 Right lower quadrant pain: Secondary | ICD-10-CM | POA: Diagnosis present

## 2020-03-27 DIAGNOSIS — N320 Bladder-neck obstruction: Secondary | ICD-10-CM | POA: Diagnosis not present

## 2020-03-27 DIAGNOSIS — E860 Dehydration: Secondary | ICD-10-CM | POA: Diagnosis present

## 2020-03-27 DIAGNOSIS — M109 Gout, unspecified: Secondary | ICD-10-CM | POA: Diagnosis present

## 2020-03-27 DIAGNOSIS — R42 Dizziness and giddiness: Secondary | ICD-10-CM | POA: Diagnosis not present

## 2020-03-27 DIAGNOSIS — I951 Orthostatic hypotension: Secondary | ICD-10-CM | POA: Diagnosis not present

## 2020-03-27 DIAGNOSIS — E875 Hyperkalemia: Secondary | ICD-10-CM | POA: Diagnosis not present

## 2020-03-27 DIAGNOSIS — I10 Essential (primary) hypertension: Secondary | ICD-10-CM | POA: Diagnosis not present

## 2020-03-27 DIAGNOSIS — E861 Hypovolemia: Secondary | ICD-10-CM | POA: Diagnosis not present

## 2020-03-27 DIAGNOSIS — N170 Acute kidney failure with tubular necrosis: Principal | ICD-10-CM | POA: Diagnosis present

## 2020-03-27 DIAGNOSIS — E1142 Type 2 diabetes mellitus with diabetic polyneuropathy: Secondary | ICD-10-CM | POA: Diagnosis not present

## 2020-03-27 DIAGNOSIS — E1159 Type 2 diabetes mellitus with other circulatory complications: Secondary | ICD-10-CM | POA: Diagnosis not present

## 2020-03-27 DIAGNOSIS — Z23 Encounter for immunization: Secondary | ICD-10-CM

## 2020-03-27 DIAGNOSIS — R001 Bradycardia, unspecified: Secondary | ICD-10-CM | POA: Diagnosis not present

## 2020-03-27 DIAGNOSIS — E7800 Pure hypercholesterolemia, unspecified: Secondary | ICD-10-CM | POA: Diagnosis present

## 2020-03-27 DIAGNOSIS — N179 Acute kidney failure, unspecified: Secondary | ICD-10-CM | POA: Diagnosis not present

## 2020-03-27 DIAGNOSIS — Z20822 Contact with and (suspected) exposure to covid-19: Secondary | ICD-10-CM | POA: Diagnosis present

## 2020-03-27 DIAGNOSIS — Z87891 Personal history of nicotine dependence: Secondary | ICD-10-CM

## 2020-03-27 DIAGNOSIS — Z79899 Other long term (current) drug therapy: Secondary | ICD-10-CM | POA: Diagnosis not present

## 2020-03-27 DIAGNOSIS — I9589 Other hypotension: Secondary | ICD-10-CM | POA: Diagnosis present

## 2020-03-27 DIAGNOSIS — K219 Gastro-esophageal reflux disease without esophagitis: Secondary | ICD-10-CM | POA: Diagnosis not present

## 2020-03-27 DIAGNOSIS — Z7982 Long term (current) use of aspirin: Secondary | ICD-10-CM

## 2020-03-27 DIAGNOSIS — L989 Disorder of the skin and subcutaneous tissue, unspecified: Secondary | ICD-10-CM | POA: Diagnosis not present

## 2020-03-27 DIAGNOSIS — Z794 Long term (current) use of insulin: Secondary | ICD-10-CM

## 2020-03-27 DIAGNOSIS — Z833 Family history of diabetes mellitus: Secondary | ICD-10-CM | POA: Diagnosis not present

## 2020-03-27 DIAGNOSIS — R972 Elevated prostate specific antigen [PSA]: Secondary | ICD-10-CM | POA: Diagnosis present

## 2020-03-27 DIAGNOSIS — N138 Other obstructive and reflux uropathy: Secondary | ICD-10-CM | POA: Diagnosis present

## 2020-03-27 DIAGNOSIS — K5900 Constipation, unspecified: Secondary | ICD-10-CM | POA: Diagnosis present

## 2020-03-27 DIAGNOSIS — N419 Inflammatory disease of prostate, unspecified: Secondary | ICD-10-CM | POA: Diagnosis present

## 2020-03-27 DIAGNOSIS — I447 Left bundle-branch block, unspecified: Secondary | ICD-10-CM | POA: Diagnosis not present

## 2020-03-27 DIAGNOSIS — E872 Acidosis: Secondary | ICD-10-CM | POA: Diagnosis not present

## 2020-03-27 DIAGNOSIS — R339 Retention of urine, unspecified: Secondary | ICD-10-CM | POA: Diagnosis present

## 2020-03-27 DIAGNOSIS — N410 Acute prostatitis: Secondary | ICD-10-CM | POA: Diagnosis not present

## 2020-03-27 DIAGNOSIS — L409 Psoriasis, unspecified: Secondary | ICD-10-CM | POA: Diagnosis present

## 2020-03-27 DIAGNOSIS — H538 Other visual disturbances: Secondary | ICD-10-CM | POA: Diagnosis present

## 2020-03-27 DIAGNOSIS — R1030 Lower abdominal pain, unspecified: Secondary | ICD-10-CM | POA: Diagnosis not present

## 2020-03-27 DIAGNOSIS — L0390 Cellulitis, unspecified: Secondary | ICD-10-CM | POA: Diagnosis not present

## 2020-03-27 LAB — CBC
HCT: 35.8 % — ABNORMAL LOW (ref 39.0–52.0)
Hemoglobin: 12 g/dL — ABNORMAL LOW (ref 13.0–17.0)
MCH: 29.5 pg (ref 26.0–34.0)
MCHC: 33.5 g/dL (ref 30.0–36.0)
MCV: 88 fL (ref 80.0–100.0)
Platelets: 183 10*3/uL (ref 150–400)
RBC: 4.07 MIL/uL — ABNORMAL LOW (ref 4.22–5.81)
RDW: 12.4 % (ref 11.5–15.5)
WBC: 7.6 10*3/uL (ref 4.0–10.5)
nRBC: 0 % (ref 0.0–0.2)

## 2020-03-27 LAB — BASIC METABOLIC PANEL
Anion gap: 11 (ref 5–15)
Anion gap: 9 (ref 5–15)
BUN: 67 mg/dL — ABNORMAL HIGH (ref 8–23)
BUN: 59 mg/dL — ABNORMAL HIGH (ref 8–23)
CO2: 17 mmol/L — ABNORMAL LOW (ref 22–32)
CO2: 21 mmol/L — ABNORMAL LOW (ref 22–32)
Calcium: 9.1 mg/dL (ref 8.9–10.3)
Calcium: 8.5 mg/dL — ABNORMAL LOW (ref 8.9–10.3)
Chloride: 106 mmol/L (ref 98–111)
Chloride: 104 mmol/L (ref 98–111)
Creatinine, Ser: 1.89 mg/dL — ABNORMAL HIGH (ref 0.61–1.24)
Creatinine, Ser: 1.61 mg/dL — ABNORMAL HIGH (ref 0.61–1.24)
GFR calc Af Amer: 41 mL/min — ABNORMAL LOW (ref >60)
GFR calc Af Amer: 50 mL/min — ABNORMAL LOW (ref >60)
GFR calc non Af Amer: 35 mL/min — ABNORMAL LOW (ref >60)
GFR calc non Af Amer: 43 mL/min — ABNORMAL LOW (ref >60)
Glucose, Bld: 287 mg/dL — ABNORMAL HIGH (ref 70–99)
Glucose, Bld: 359 mg/dL — ABNORMAL HIGH (ref 70–99)
Potassium: 5.1 mmol/L (ref 3.5–5.1)
Potassium: 4.9 mmol/L (ref 3.5–5.1)
Sodium: 134 mmol/L — ABNORMAL LOW (ref 135–145)
Sodium: 134 mmol/L — ABNORMAL LOW (ref 135–145)

## 2020-03-27 LAB — APTT: aPTT: 25 seconds (ref 24–36)

## 2020-03-27 LAB — PROTIME-INR
INR: 1.1 (ref 0.8–1.2)
Prothrombin Time: 13.4 seconds (ref 11.4–15.2)

## 2020-03-27 LAB — HEMOGLOBIN A1C
Hgb A1c MFr Bld: 8.4 % — ABNORMAL HIGH (ref 4.8–5.6)
Mean Plasma Glucose: 194.38 mg/dL

## 2020-03-27 LAB — URINALYSIS, MICROSCOPIC (REFLEX)
Squamous Epithelial / HPF: NONE SEEN (ref 0–5)
WBC, UA: NONE SEEN WBC/hpf (ref 0–5)

## 2020-03-27 LAB — URINALYSIS, ROUTINE W REFLEX MICROSCOPIC
Bilirubin Urine: NEGATIVE
Glucose, UA: >=500 mg/dL — AB
Hgb urine dipstick: NEGATIVE
Ketones, ur: NEGATIVE mg/dL
Leukocytes,Ua: NEGATIVE
Nitrite: NEGATIVE
Protein, ur: NEGATIVE mg/dL
Specific Gravity, Urine: 1.02 (ref 1.005–1.030)
pH: 5 (ref 5.0–8.0)

## 2020-03-27 LAB — DIFFERENTIAL
Abs Immature Granulocytes: 0.02 10*3/uL (ref 0.00–0.07)
Basophils Absolute: 0 10*3/uL (ref 0.0–0.1)
Basophils Relative: 1 %
Eosinophils Absolute: 0.1 10*3/uL (ref 0.0–0.5)
Eosinophils Relative: 1 %
Immature Granulocytes: 0 %
Lymphocytes Relative: 15 %
Lymphs Abs: 1.2 10*3/uL (ref 0.7–4.0)
Monocytes Absolute: 0.5 10*3/uL (ref 0.1–1.0)
Monocytes Relative: 6 %
Neutro Abs: 5.9 10*3/uL (ref 1.7–7.7)
Neutrophils Relative %: 77 %

## 2020-03-27 LAB — COMPREHENSIVE METABOLIC PANEL
ALT: 18 U/L (ref 0–44)
AST: 18 U/L (ref 15–41)
Albumin: 4.8 g/dL (ref 3.5–5.0)
Alkaline Phosphatase: 60 U/L (ref 38–126)
Anion gap: 11 (ref 5–15)
BUN: 72 mg/dL — ABNORMAL HIGH (ref 8–23)
CO2: 20 mmol/L — ABNORMAL LOW (ref 22–32)
Calcium: 9.7 mg/dL (ref 8.9–10.3)
Chloride: 106 mmol/L (ref 98–111)
Creatinine, Ser: 2.22 mg/dL — ABNORMAL HIGH (ref 0.61–1.24)
GFR calc Af Amer: 34 mL/min — ABNORMAL LOW (ref >60)
GFR calc non Af Amer: 29 mL/min — ABNORMAL LOW (ref >60)
Glucose, Bld: 244 mg/dL — ABNORMAL HIGH (ref 70–99)
Potassium: 6.9 mmol/L (ref 3.5–5.1)
Sodium: 137 mmol/L (ref 135–145)
Total Bilirubin: 0.5 mg/dL (ref 0.3–1.2)
Total Protein: 8.5 g/dL — ABNORMAL HIGH (ref 6.5–8.1)

## 2020-03-27 LAB — CBG MONITORING, ED: Glucose-Capillary: 217 mg/dL — ABNORMAL HIGH (ref 70–99)

## 2020-03-27 LAB — SARS CORONAVIRUS 2 BY RT PCR (HOSPITAL ORDER, PERFORMED IN ~~LOC~~ HOSPITAL LAB): SARS Coronavirus 2: NEGATIVE

## 2020-03-27 LAB — GLUCOSE, POCT (MANUAL RESULT ENTRY): POC Glucose: 239 mg/dl — AB (ref 70–99)

## 2020-03-27 LAB — CK: Total CK: 59 U/L (ref 49–397)

## 2020-03-27 LAB — LIPASE, BLOOD: Lipase: 42 U/L (ref 11–51)

## 2020-03-27 MED ORDER — METOPROLOL SUCCINATE ER 25 MG PO TB24: 50.0000 mg | ORAL_TABLET | Freq: Every day | ORAL | Status: DC

## 2020-03-27 MED ORDER — ATORVASTATIN CALCIUM 40 MG PO TABS
40.0000 mg | ORAL_TABLET | Freq: Every day | ORAL | Status: DC
  Administered 2020-03-27 – 2020-03-29 (×3): 40 mg via ORAL
  Filled 2020-03-27 (×3): qty 1

## 2020-03-27 MED ORDER — LACTATED RINGERS IV BOLUS
1000.0000 mL | Freq: Once | INTRAVENOUS | Status: AC
  Administered 2020-03-27: 1000 mL via INTRAVENOUS

## 2020-03-27 MED ORDER — INSULIN ASPART 100 UNIT/ML ~~LOC~~ SOLN
0.0000 [IU] | Freq: Three times a day (TID) | SUBCUTANEOUS | Status: DC
  Administered 2020-03-28: 7 [IU] via SUBCUTANEOUS
  Administered 2020-03-28: 3 [IU] via SUBCUTANEOUS
  Administered 2020-03-28: 2 [IU] via SUBCUTANEOUS

## 2020-03-27 MED ORDER — SODIUM BICARBONATE 8.4 % IV SOLN
INTRAVENOUS | Status: DC
  Filled 2020-03-27 (×7): qty 850

## 2020-03-27 MED ORDER — SODIUM BICARBONATE 8.4 % IV SOLN
INTRAVENOUS | Status: AC
  Filled 2020-03-27: qty 150

## 2020-03-27 MED ORDER — HYDROCODONE-ACETAMINOPHEN 5-325 MG PO TABS
1.0000 | ORAL_TABLET | Freq: Four times a day (QID) | ORAL | Status: DC | PRN
  Administered 2020-03-28 (×2): 1 via ORAL
  Filled 2020-03-27 (×2): qty 1

## 2020-03-27 MED ORDER — ALBUTEROL SULFATE HFA 108 (90 BASE) MCG/ACT IN AERS
8.0000 | INHALATION_SPRAY | Freq: Once | RESPIRATORY_TRACT | Status: AC
  Administered 2020-03-27: 8 via RESPIRATORY_TRACT
  Filled 2020-03-27: qty 6.7

## 2020-03-27 MED ORDER — CIPROFLOXACIN IN D5W 400 MG/200ML IV SOLN
400.0000 mg | Freq: Two times a day (BID) | INTRAVENOUS | Status: DC
  Filled 2020-03-27: qty 200

## 2020-03-27 MED ORDER — CALCIUM GLUCONATE-NACL 1-0.675 GM/50ML-% IV SOLN
1.0000 g | Freq: Once | INTRAVENOUS | Status: AC
  Administered 2020-03-27: 1000 mg via INTRAVENOUS
  Filled 2020-03-27: qty 50

## 2020-03-27 MED ORDER — METFORMIN HCL ER 500 MG PO TB24
1000.0000 mg | ORAL_TABLET | Freq: Two times a day (BID) | ORAL | Status: DC
  Filled 2020-03-27: qty 2

## 2020-03-27 MED ORDER — DEXTROSE 50 % IV SOLN
1.0000 | Freq: Once | INTRAVENOUS | Status: AC
  Administered 2020-03-27: 50 mL via INTRAVENOUS
  Filled 2020-03-27: qty 50

## 2020-03-27 MED ORDER — SODIUM BICARBONATE 8.4 % IV SOLN
INTRAVENOUS | Status: AC
  Filled 2020-03-27: qty 100

## 2020-03-27 MED ORDER — INSULIN ASPART 100 UNIT/ML ~~LOC~~ SOLN
0.0000 [IU] | Freq: Every day | SUBCUTANEOUS | Status: DC
  Administered 2020-03-28: 3 [IU] via SUBCUTANEOUS

## 2020-03-27 MED ORDER — METOPROLOL TARTRATE 12.5 MG HALF TABLET
12.5000 mg | ORAL_TABLET | Freq: Two times a day (BID) | ORAL | Status: DC
  Administered 2020-03-28 (×2): 12.5 mg via ORAL
  Filled 2020-03-27 (×4): qty 1

## 2020-03-27 MED ORDER — ASPIRIN EC 81 MG PO TBEC
81.0000 mg | DELAYED_RELEASE_TABLET | Freq: Every day | ORAL | Status: DC
  Administered 2020-03-27 – 2020-03-29 (×3): 81 mg via ORAL
  Filled 2020-03-27 (×4): qty 1

## 2020-03-27 MED ORDER — HEPARIN SODIUM (PORCINE) 5000 UNIT/ML IJ SOLN
5000.0000 [IU] | Freq: Three times a day (TID) | INTRAMUSCULAR | Status: DC
  Administered 2020-03-28 – 2020-03-29 (×5): 5000 [IU] via SUBCUTANEOUS
  Filled 2020-03-27 (×5): qty 1

## 2020-03-27 MED ORDER — LACTATED RINGERS IV BOLUS: 1000.0000 mL | Freq: Once | INTRAVENOUS | Status: DC

## 2020-03-27 MED ORDER — SODIUM ZIRCONIUM CYCLOSILICATE 10 G PO PACK
10.0000 g | PACK | Freq: Once | ORAL | Status: AC
  Administered 2020-03-27: 10 g via ORAL
  Filled 2020-03-27: qty 1

## 2020-03-27 MED ORDER — INSULIN ASPART 100 UNIT/ML ~~LOC~~ SOLN
SUBCUTANEOUS | Status: AC
  Administered 2020-03-27: 5 [IU] via INTRAVENOUS
  Filled 2020-03-27: qty 5

## 2020-03-27 MED ORDER — METOPROLOL SUCCINATE ER 50 MG PO TB24
50.0000 mg | ORAL_TABLET | Freq: Every day | ORAL | Status: DC
  Administered 2020-03-27: 50 mg via ORAL
  Filled 2020-03-27: qty 2

## 2020-03-27 MED ORDER — SODIUM CHLORIDE 0.9 % IV BOLUS
1000.0000 mL | Freq: Once | INTRAVENOUS | Status: AC
  Administered 2020-03-27: 1000 mL via INTRAVENOUS

## 2020-03-27 MED ORDER — INSULIN ASPART PROT & ASPART (70-30 MIX) 100 UNIT/ML ~~LOC~~ SUSP
28.0000 [IU] | Freq: Two times a day (BID) | SUBCUTANEOUS | Status: DC
  Administered 2020-03-28: 28 [IU] via SUBCUTANEOUS
  Filled 2020-03-27: qty 10

## 2020-03-27 MED ORDER — PNEUMOCOCCAL VAC POLYVALENT 25 MCG/0.5ML IJ INJ
0.5000 mL | INJECTION | INTRAMUSCULAR | Status: AC
  Administered 2020-03-28: 0.5 mL via INTRAMUSCULAR
  Filled 2020-03-27: qty 0.5

## 2020-03-27 MED ORDER — INSULIN ASPART 100 UNIT/ML IV SOLN
5.0000 [IU] | Freq: Once | INTRAVENOUS | Status: AC
  Filled 2020-03-27: qty 0.05

## 2020-03-27 NOTE — ED Provider Notes (Signed)
[facility] EMERGENCY DEPARTMENT Provider Note   CSN: [id number] Arrival date & time: 03/27/20  0855     History Chief Complaint  Patient presents with  . Abdominal Pain  . Weakness    Scott WEHNER is a 69 y.o. male.  HPI 69 year old male presents with abdominal pain and dizziness.  Sent from his PCPs office given borderline low blood pressures.  The patient has been having episodes like this on and off for months.  However over the past 2 weeks it has not gone away like it typically would after a day or 2.  The dizziness is pretty much all the time but especially whenever he tries to sit up.  Feels off balance when he walks.  Kind of feels like he might pass out as well.  No double vision but sometimes some blurry vision, especially when outside.  Mild headaches come and go.  No vomiting.  The abdominal pain has now been present for a couple weeks and is across his lower abdomen.  No diarrhea.  No chest pain, shortness of breath, cough or leg edema.   Past Medical History:  Diagnosis Date  . Abdominal pain 08/04/2016  . Abnormal PSA 05/19/2015  . Arthralgia 11/12/2010  . Benign prostatic hyperplasia with urinary hesitancy 06/14/2018  . Cataract   . CELLULITIS, FOOT, RIGHT 07/02/2008   Qualifier: Diagnosis of  By: Marca Ancona RMA, Lucy    . Cough 08/04/2016  . Diabetes ([place]) 03/04/2007   Qualifier: Diagnosis of  By: Loanne Drilling MD, Jacelyn Pi   . DIABETES MELLITUS, TYPE II 03/04/2007  . Edema 07/02/2008   Qualifier: Diagnosis of  By: Marca Ancona RMA, Lucy    . Elevated LDL cholesterol level 06/14/2018  . Foot ulcer, right ([place]) 04/15/2014  . GERD (gastroesophageal reflux disease)   . HYPERCHOLESTEROLEMIA 07/29/2008  . HYPERTENSION 03/04/2007  . Knee pain, left 05/16/2015  . Loss of vision 04/15/2014  . Nonintractable episodic headache 12/01/2016  . Psoriasis   . SHOULDER PAIN, BILATERAL 01/12/2010   Qualifier: Diagnosis of  By: Loanne Drilling MD, Jacelyn Pi   . Swelling of left foot 08/05/2017  .  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 Hypotension due to hypovolemia 03/27/2020  . Hyperkalemia 03/27/2020  . Right knee pain 03/07/2020  . Acute idiopathic gout of right knee 02/25/2020  . Bilateral carotid bruits 12/05/2019  . Type 2 diabetes mellitus with diabetic polyneuropathy, with long-term current use of insulin ([place]) 09/05/2019  . Diabetes mellitus ([place]) 09/05/2019  . Cervical radiculopathy 02/12/2019  . Constipation 02/12/2019  . DOE (dyspnea on exertion) 02/12/2019  . Chest pain 02/12/2019  . No-show for appointment 11/21/2018  . Coronary artery disease involving native heart 07/20/2018  . Bronchitis 07/20/2018  . Abnormal stress test   . Benign prostatic hyperplasia with urinary hesitancy 06/14/2018  . Elevated LDL cholesterol level 06/14/2018  . Swelling of left foot 08/05/2017  . Nonintractable episodic headache 12/01/2016  . Cough 08/04/2016  . Lower abdominal pain 08/04/2016  . Abnormal PSA 05/19/2015  . OA (osteoarthritis) of knee 05/16/2015  . Foot ulcer, right ([place]) 04/15/2014  . Loss of vision 04/15/2014  . Screening for prostate cancer 11/03/2012  . Routine general medical examination at a health care facility 11/03/2012  . Arthralgia 11/12/2010  . SHOULDER PAIN, BILATERAL 01/12/2010  . 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 HYPERCHOLESTEROLEMIA 07/29/2008  .  CELLULITIS, FOOT, RIGHT 07/02/2008  . EDEMA 07/02/2008  . Type 2 diabetes mellitus with hyperglycemia, with long-term current use of insulin ([place]) 03/04/2007  . Essential hypertension 03/04/2007    Past Surgical History:  Procedure Laterality Date  . COLONOSCOPY    . LEFT HEART CATH AND CORONARY ANGIOGRAPHY N/A 07/12/2018   Procedure: LEFT HEART CATH AND CORONARY ANGIOGRAPHY;  Surgeon: Wellington Hampshire, MD;  Location: [facility];  Service: Cardiovascular;  Laterality: N/A;   . stress cardiolite  05/30/2003       Family History  Problem Relation Age of Onset  . Cancer Mother        Breast Cancer  . Cancer Father        uncertain type  . Diabetes Father   . Cancer Brother 67       Colon Cancer  . Colon cancer Neg Hx   . Rectal cancer Neg Hx   . Stomach cancer Neg Hx     Social History   Tobacco Use  . Smoking status: Former Smoker    Quit date: 07/26/1988    Years since quitting: 31.6  . Smokeless tobacco: Never Used  Vaping Use  . Vaping Use: Never used  Substance Use Topics  . Alcohol use: No  . Drug use: No    Home Medications Prior to Admission medications   Medication Sig Start Date End Date Taking? Authorizing Provider  aspirin EC 81 MG tablet Take 1 tablet (81 mg total) by mouth daily. 06/27/18   Josue Hector, MD  atorvastatin (LIPITOR) 40 MG tablet Take 1 tablet (40 mg total) by mouth daily. 08/29/19   [name], MD  colchicine 0.6 MG tablet Take 1 tablet (0.6 mg total) by mouth 2 (two) times daily. 02/25/20   Rosemarie Ax, MD  dapagliflozin propanediol (FARXIGA) 5 MG TABS tablet Take 5 mg by mouth daily before breakfast. Patient not taking: Reported on 03/27/2020 12/06/19   Shamleffer, Melanie Crazier, MD  diclofenac Sodium (VOLTAREN) 1 % GEL Apply a small grape sized dollop to tender space of right knee up to 4 times daily. 03/07/20   [name], MD  gabapentin (NEURONTIN) 300 MG capsule Take one at night for one week and then increase to one twice daily. 12/05/19   [name], MD  glucose blood test strip USE TO CHECK BLOOD SUGAR TWICE DAILY 10/02/18   Renato Shin, MD  insulin NPH-regular Human (70-30) 100 UNIT/ML injection Inject 36 Units into the skin 2 (two) times daily with a meal. 10/11/19   Shamleffer, Melanie Crazier, MD  metFORMIN (GLUCOPHAGE-XR) 500 MG 24 hr tablet Take 2 tablets (1,000 mg total) by mouth 2 (two) times daily. 12/06/19   Shamleffer, Melanie Crazier, MD  metoprolol succinate  (TOPROL-XL) 50 MG 24 hr tablet Take 1 tablet (50 mg total) by mouth daily. Take with or immediately following a meal. 08/29/19   [name], MD  olmesartan (BENICAR) 20 MG tablet TAKE 1 TABLET(20 MG) BY MOUTH DAILY 01/08/20   [name], MD  tamsulosin (FLOMAX) 0.4 MG CAPS capsule Take 0.4 mg by mouth daily. Patient not taking: Reported on 03/27/2020 11/19/19   [provider]  traMADol (ULTRAM) 50 MG tablet Take one at night as needed. Patient not taking: Reported on 03/27/2020 03/07/20   [name], MD    Allergies    Patient has no known allergies.  Review of Systems   Review of Systems  Constitutional: Negative for  fever.  Respiratory: Negative for cough and shortness of breath.   Gastrointestinal: Positive for abdominal pain. Negative for diarrhea and vomiting.  Neurological: Positive for dizziness and headaches.  All other systems reviewed and are negative.   Physical Exam Updated Vital Signs BP (!) 101/44   Pulse (!) 58   Temp 97.7 F (36.5 C) (Oral)   Resp 12   Ht 5\' 11"  (1.803 m)   Wt 84.5 kg   SpO2 98%   BMI 26.00 kg/m   Physical Exam Vitals and nursing note reviewed. Exam conducted with a chaperone present.  Constitutional:      General: He is not in acute distress.    Appearance: He is well-developed. He is not ill-appearing or diaphoretic.  HENT:     Head: Normocephalic and atraumatic.     Right Ear: External ear normal.     Left Ear: External ear normal.     Nose: Nose normal.  Eyes:     General:        Right eye: No discharge.        Left eye: No discharge.     Extraocular Movements: Extraocular movements intact.     Pupils: Pupils are equal, round, and reactive to light.  Cardiovascular:     Rate and Rhythm: Normal rate and regular rhythm.     Heart sounds: Normal heart sounds.  Pulmonary:     Effort: Pulmonary effort is normal.     Breath sounds: Normal breath sounds.  Abdominal:     Palpations: Abdomen is  soft.     Tenderness: There is abdominal tenderness in the right lower quadrant, suprapubic area and left lower quadrant.  Genitourinary:    Prostate: Not tender.     Rectum: No tenderness.  Musculoskeletal:     Cervical back: Normal range of motion and neck supple. No rigidity. Normal range of motion.  Skin:    General: Skin is warm and dry.  Neurological:     Mental Status: He is alert.  Psychiatric:        Mood and Affect: Mood is not anxious.     ED Results / Procedures / Treatments   Labs (all labs ordered are listed, but only abnormal results are displayed) Labs Reviewed  COMPREHENSIVE METABOLIC PANEL - Abnormal; Notable for the following components:      Result Value   Potassium 6.9 (*)    CO2 20 (*)    Glucose, Bld 244 (*)    BUN 72 (*)    Creatinine, Ser 2.22 (*)    Total Protein 8.5 (*)    GFR calc non Af Amer 29 (*)    GFR calc Af Amer 34 (*)    All other components within normal limits  CBC - Abnormal; Notable for the following components:   RBC 4.07 (*)    Hemoglobin 12.0 (*)    HCT 35.8 (*)    All other components within normal limits  BASIC METABOLIC PANEL - Abnormal; Notable for the following components:   Sodium 134 (*)    CO2 17 (*)    Glucose, Bld 287 (*)    BUN 67 (*)    Creatinine, Ser 1.89 (*)    GFR calc non Af Amer 35 (*)    GFR calc Af Amer 41 (*)    All other components within normal limits  CBG MONITORING, ED - Abnormal; Notable for the following components:   Glucose-Capillary 217 (*)    All other components within normal  limits  SARS CORONAVIRUS 2 BY RT PCR (HOSPITAL ORDER, [facility])  LIPASE, BLOOD  PROTIME-INR  APTT  DIFFERENTIAL  CK  URINALYSIS, ROUTINE W REFLEX MICROSCOPIC  BASIC METABOLIC PANEL    EKG EKG Interpretation  Date/Time:  Thursday March 27 2020 09:15:38 EDT Ventricular Rate:  53 PR Interval:    QRS Duration: 143 QT Interval:  457 QTC Calculation: 430 R Axis:   -67 Text  Interpretation: Sinus rhythm Left bundle branch block No old tracing to compare Confirmed by Sherwood Gambler [phone]) on 03/27/2020 11:19:53 AM   Radiology CT ABDOMEN PELVIS WO CONTRAST  Result Date: 03/27/2020 CLINICAL DATA:  Suspected diverticulitis. EXAM: CT ABDOMEN AND PELVIS WITHOUT CONTRAST TECHNIQUE: Multidetector CT imaging of the abdomen and pelvis was performed following the standard protocol without IV contrast. COMPARISON:  Abdominal sonogram from 2018 FINDINGS: Lower chest: Lung bases are clear.  No effusion.  No consolidation. Hepatobiliary: Noncontrast appearance of liver and gallbladder is normal. No gross biliary duct distension. Pancreas: Pancreas normal contour, no peripancreatic inflammation. No ductal dilation. Spleen: Spleen normal in size and contour. Adrenals/Urinary Tract: Adrenal glands are normal. Mild perinephric stranding bilaterally, nonspecific finding. Urinary bladder under distended with impression upon the bladder base from a markedly enlarged prostate. No hydronephrosis. No nephrolithiasis. No ureteral calculi. Stomach/Bowel: Stomach under distended. Small bowel without dilation or signs of inflammation. Normal appendix. Scattered colonic diverticulosis without signs of diverticulitis Vascular/Lymphatic: Atheromatous plaque of the abdominal aorta with calcification. No aneurysmal dilation. No adenopathy in the retroperitoneum. No adenopathy in the pelvis. Reproductive: Markedly enlarged prostate, prostate measuring approximately 7.4 x 6.0 x 6.4 cm. Mild periprosthetic stranding may be present. Findings are nonspecific on CT. Other: Mild stranding in the subcutaneous fat of the anterior abdominal wall in the midline with some skin thickening. No ascites. Small fat containing umbilical hernia. Musculoskeletal: Spinal degenerative changes. No acute or destructive bone finding. IMPRESSION: 1. Markedly enlarged prostate, prostate measuring approximately 7.4 x 6.0 x 6.4 cm. Mild  periprosthetic stranding may be present. Findings are nonspecific on CT. Correlate with any clinical or laboratory evidence of prostatitis in the setting of lower abdominal pain. 2. Mild stranding in the subcutaneous fat of the anterior abdominal wall in the midline with some skin thickening. Findings could be related to injection sites. Correlate with any signs of cellulitis. 3. No signs of nephrolithiasis or hydronephrosis. 4. Normal appendix. 5. Diverticulosis without diverticulitis. 6. Aortic atherosclerosis. Aortic Atherosclerosis (ICD10-I70.0). Electronically Signed   By: Zetta Bills M.D.   On: 03/27/2020 11:25   CT Head Wo Contrast  Result Date: 03/27/2020 CLINICAL DATA:  Nonspecific dizziness for 2 weeks EXAM: CT HEAD WITHOUT CONTRAST TECHNIQUE: Contiguous axial images were obtained from the base of the skull through the vertex without intravenous contrast. COMPARISON:  [facility] 06/15/2018 FINDINGS: Brain: No evidence of acute infarction, hemorrhage, hydrocephalus, extra-axial collection or mass lesion/mass effect. Vascular: No hyperdense vessel or unexpected calcification. Skull: Normal. Negative for fracture or focal lesion. Sinuses/Orbits: No acute finding. IMPRESSION: Negative head CT. Electronically Signed   By: Monte Fantasia M.D.   On: 03/27/2020 11:21   DG Chest Portable 1 View  Result Date: 03/27/2020 CLINICAL DATA:  Dizziness EXAM: PORTABLE CHEST 1 VIEW COMPARISON:  08/04/2016 FINDINGS: The heart size and mediastinal contours are within normal limits. 9 mm nodular density projects over the periphery of the right lower lung field, and is favored to represent a nipple shadow. The lungs are otherwise clear. The visualized skeletal structures are unremarkable.  IMPRESSION: 1. No active cardiopulmonary disease. 2. 9 mm nodular density projects over the periphery of the right lower lung field, and is favored to represent a nipple shadow. Repeat radiograph with nipple markers could be performed to  confirm. Electronically Signed   By: Davina Poke D.O.   On: 03/27/2020 11:09    Procedures Ultrasound ED Peripheral IV (Provider)  Date/Time: 03/27/2020 9:46 AM Performed by: Sherwood Gambler, MD Authorized by: Sherwood Gambler, MD   Procedure details:    Indications: multiple failed IV attempts and poor IV access     Skin Prep: isopropyl alcohol     Location:  Right AC   Angiocath:  20 G   Bedside Ultrasound Guided: Yes     Patient tolerated procedure without complications: Yes     Dressing applied: Yes    .Critical Care Performed by: Sherwood Gambler, MD Authorized by: Sherwood Gambler, MD   Critical care provider statement:    Critical care time (minutes):  40   Critical care time was exclusive of:  Separately billable procedures and treating other patients   Critical care was necessary to treat or prevent imminent or life-threatening deterioration of the following conditions:  Renal failure and metabolic crisis   Critical care was time spent personally by me on the following activities:  Discussions with consultants, evaluation of patient's response to treatment, examination of patient, ordering and performing treatments and interventions, ordering and review of laboratory studies, ordering and review of radiographic studies, pulse oximetry, re-evaluation of patient's condition, obtaining history from patient or surrogate and review of old charts   (including critical care time)  Medications Ordered in ED Medications  sodium bicarbonate 150 mEq in dextrose 5 % 1,000 mL infusion ( Intravenous New Bag/Given 03/27/20 1243)  sodium bicarbonate 1 mEq/mL injection (has no administration in time range)  sodium chloride 0.9 % bolus 1,000 mL (0 mLs Intravenous Stopped 03/27/20 1125)  lactated ringers bolus 1,000 mL (0 mLs Intravenous Stopped 03/27/20 1239)  insulin aspart (novoLOG) injection 5 Units (5 Units Intravenous Given 03/27/20 1134)    And  dextrose 50 % solution 50 mL (50 mLs  Intravenous Given 03/27/20 1133)  albuterol (VENTOLIN HFA) 108 (90 Base) MCG/ACT inhaler 8 puff (8 puffs Inhalation Given 03/27/20 1147)  calcium gluconate 1 g/ 50 mL sodium chloride IVPB (0 g Intravenous Stopped 03/27/20 1239)  sodium zirconium cyclosilicate (LOKELMA) packet 10 g (10 g Oral Given 03/27/20 1240)    ED Course  I have reviewed the triage vital signs and the nursing notes.  Pertinent labs & imaging results that were available during my care of the patient were reviewed by me and considered in my medical decision making (see chart for details).    MDM Rules/Calculators/A&P                          I have reviewed the images, ECG and lab work.  Neuro exam is unremarkable at this time.  With his vertigo-like symptoms, CT head was obtained but is benign.  CT abdomen/pelvis is remarkable for large prostate but otherwise fairly benign as well.  I doubt prostatitis given benign rectal exam.  Otherwise, I do not see anything in his abdominal wall consistent with cellulitis or other acute abnormality.  He is noted to have acute kidney injury with hyperkalemia.  He has a left bundle branch block which initially was the only EKG I could see but now it appears that he has had this  before.  Thus he does not appear to have any acute ECG changes.  He will be given IV fluids.  He otherwise will need admission.  I have consulted nephrology, Dr Justin Mend, who asks for 3 amps of bicarb in D5W at 100/h.  He has already received 2 L IV fluid bolus.  There is no urinary retention.  He thinks probably the patient has gotten dehydrated while on Benicar.  I discussed with the hospitalist, Dr. Roosevelt Locks, who will admit.  Patient's repeat BMP shows improved K. Will get another around 6 pm while he awaits admission Final Clinical Impression(s) / ED Diagnoses Final diagnoses:  Acute kidney injury ([place])  Hyperkalemia    Rx / DC Orders ED Discharge Orders    None       Sherwood Gambler, MD 03/27/20 1541

## 2020-03-27 NOTE — Progress Notes (Signed)
Established Patient Office Visit  Subjective:  Patient ID: Scott Donaldson, male    DOB: 1950-12-09  Age: 69 y.o. MRN: [id number]  CC:  Chief Complaint  Patient presents with  . Dizziness    dizziness, stomach pains, no appetite, weak feeling symptoms x 2 weeks.     HPI Padraic A Shinsato presents for evaluation of a 2-week history lightheadedness, fatigue, nausea, abdominal pain and constipation. Glucose has been elevated. He is compliant with his insulin. Elevated PSA. Has seen urology and started tamsulosin. Urine flow is free. There has been no fever or chills. Fluid intake is difficult secondary to the nausea.  Past Medical History:  Diagnosis Date  . Abdominal pain 08/04/2016  . Abnormal PSA 05/19/2015  . Arthralgia 11/12/2010  . Benign prostatic hyperplasia with urinary hesitancy 06/14/2018  . Cataract   . CELLULITIS, FOOT, RIGHT 07/02/2008   Qualifier: Diagnosis of  By: Marca Ancona RMA, Lucy    . Cough 08/04/2016  . Diabetes ([place]) 03/04/2007   Qualifier: Diagnosis of  By: Loanne Drilling MD, Jacelyn Pi   . DIABETES MELLITUS, TYPE II 03/04/2007  . Edema 07/02/2008   Qualifier: Diagnosis of  By: Marca Ancona RMA, Lucy    . Elevated LDL cholesterol level 06/14/2018  . Foot ulcer, right ([place]) 04/15/2014  . GERD (gastroesophageal reflux disease)   . HYPERCHOLESTEROLEMIA 07/29/2008  . HYPERTENSION 03/04/2007  . Knee pain, left 05/16/2015  . Loss of vision 04/15/2014  . Nonintractable episodic headache 12/01/2016  . Psoriasis   . SHOULDER PAIN, BILATERAL 01/12/2010   Qualifier: Diagnosis of  By: Loanne Drilling MD, Jacelyn Pi   . Swelling of left foot 08/05/2017  . 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 COLONOSCOPY    . LEFT HEART CATH AND CORONARY ANGIOGRAPHY N/A 07/12/2018   Procedure: LEFT HEART CATH AND CORONARY ANGIOGRAPHY;  Surgeon: Wellington Hampshire, MD;  Location: [facility];  Service:  Cardiovascular;  Laterality: N/A;  . stress cardiolite  05/30/2003    Family History  Problem Relation Age of Onset  . Cancer Mother        Breast Cancer  . Cancer Father        uncertain type  . Diabetes Father   . Cancer Brother 17       Colon Cancer  . Colon cancer Neg Hx   . Rectal cancer Neg Hx   . Stomach cancer Neg Hx     Social History   Socioeconomic History  . Marital status: Single    Spouse name: Not on file  . Number of children: 2  . Years of education: 10  . Highest education level: Not on file  Occupational History  . Occupation: roofer  Tobacco Use  . Smoking status: Former Smoker    Quit date: 07/26/1988    Years since quitting: 31.6  . Smokeless tobacco: Never Used  Vaping Use  . Vaping Use: Never used  Substance and Sexual Activity  . Alcohol use: No  . Drug use: No  . Sexual activity: Not on file  Other Topics Concern  . Not on file  Social History Narrative   Lives with daughter in a one story home.  Has 2 children.  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  Education: 10th grade.    Social Determinants of Health   Financial Resource Strain:   . Difficulty of Paying Living  Expenses: Not on file  Food Insecurity:   . Worried About Charity fundraiser in the Last Year: Not on file  . Ran Out of Food in the Last Year: Not on file  Transportation Needs:   . Lack of Transportation (Medical): Not on file  . Lack of Transportation (Non-Medical): Not on file  Physical Activity:   . Days of Exercise per Week: Not on file  . Minutes of Exercise per Session: Not on file  Stress:   . Feeling of Stress : Not on file  Social Connections:   . Frequency of Communication with Friends and Family: Not on file  . Frequency of Social Gatherings with Friends and Family: Not on file  . Attends Religious Services: Not on file  . Active Member of Clubs or Organizations: Not on file  . Attends Archivist Meetings: Not on file  . Marital Status: Not on  file  Intimate Partner Violence:   . Fear of Current or Ex-Partner: Not on file  . Emotionally Abused: Not on file  . Physically Abused: Not on file  . Sexually Abused: Not on file    Outpatient Medications Prior to Visit  Medication Sig Dispense Refill  . aspirin EC 81 MG tablet Take 1 tablet (81 mg total) by mouth daily.    [name] atorvastatin (LIPITOR) 40 MG tablet Take 1 tablet (40 mg total) by mouth daily. 90 tablet 3  . colchicine 0.6 MG tablet Take 1 tablet (0.6 mg total) by mouth 2 (two) times daily. 60 tablet 2  . diclofenac Sodium (VOLTAREN) 1 % GEL Apply a small grape sized dollop to tender space of right knee up to 4 times daily. 150 g 1  . gabapentin (NEURONTIN) 300 MG capsule Take one at night for one week and then increase to one twice daily. 60 capsule 3  . glucose blood test strip USE TO CHECK BLOOD SUGAR TWICE DAILY 100 each 12  . insulin NPH-regular Human (70-30) 100 UNIT/ML injection Inject 36 Units into the skin 2 (two) times daily with a meal. 10 mL 11  . metFORMIN (GLUCOPHAGE-XR) 500 MG 24 hr tablet Take 2 tablets (1,000 mg total) by mouth 2 (two) times daily. 360 tablet 3  . metoprolol succinate (TOPROL-XL) 50 MG 24 hr tablet Take 1 tablet (50 mg total) by mouth daily. Take with or immediately following a meal. 90 tablet 3  . olmesartan (BENICAR) 20 MG tablet TAKE 1 TABLET(20 MG) BY MOUTH DAILY 90 tablet 0  . dapagliflozin propanediol (FARXIGA) 5 MG TABS tablet Take 5 mg by mouth daily before breakfast. (Patient not taking: Reported on 03/27/2020) 30 tablet 4  . tamsulosin (FLOMAX) 0.4 MG CAPS capsule Take 0.4 mg by mouth daily. (Patient not taking: Reported on 03/27/2020)    . traMADol (ULTRAM) 50 MG tablet Take one at night as needed. (Patient not taking: Reported on 03/27/2020) 15 tablet 0   No facility-administered medications prior to visit.    No Known Allergies  ROS Review of Systems  Constitutional: Positive for fatigue and unexpected weight change. Negative for  chills, diaphoresis and fever.  HENT: Negative.   Eyes: Negative for photophobia and visual disturbance.  Respiratory: Negative.   Cardiovascular: Negative.   Gastrointestinal: Positive for abdominal pain, constipation and nausea. Negative for blood in stool, diarrhea and vomiting.  Endocrine: Negative for polyphagia.  Genitourinary: Negative for difficulty urinating, frequency and urgency.  Musculoskeletal: Positive for arthralgias.  Skin: Positive for pallor.  Allergic/Immunologic: Negative for  immunocompromised state.  Neurological: Negative for light-headedness.  Hematological: Does not bruise/bleed easily.  Psychiatric/Behavioral: Negative.       Objective:    Physical Exam Vitals and nursing note reviewed.  Constitutional:      General: He is not in acute distress.    Appearance: He is ill-appearing. He is not toxic-appearing or diaphoretic.  HENT:     Head: Normocephalic and atraumatic.     Right Ear: Tympanic membrane, ear canal and external ear normal.     Left Ear: Tympanic membrane, ear canal and external ear normal.     Mouth/Throat:     Mouth: Mucous membranes are dry.     Pharynx: Oropharynx is clear. No oropharyngeal exudate or posterior oropharyngeal erythema.  Eyes:     General:        Right eye: No discharge.        Left eye: No discharge.     Conjunctiva/sclera: Conjunctivae normal.     Pupils: Pupils are equal, round, and reactive to light.  Cardiovascular:     Rate and Rhythm: Normal rate and regular rhythm.  Pulmonary:     Effort: Pulmonary effort is normal.     Breath sounds: Normal breath sounds.  Abdominal:     General: Abdomen is flat.     Tenderness: There is abdominal tenderness. There is guarding.  Musculoskeletal:     Cervical back: No rigidity or tenderness.  Lymphadenopathy:     Cervical: No cervical adenopathy.  Skin:    General: Skin is warm and dry.       Neurological:     Mental Status: He is alert and oriented to person,  place, and time.  Psychiatric:        Mood and Affect: Mood normal.        Behavior: Behavior normal.     BP (!) 100/58   Pulse (!) 56   Temp 98 F (36.7 C) (Tympanic)   Ht 5\' 11"  (1.803 m)   Wt 186 lb 6.4 oz (84.6 kg)   SpO2 97%   BMI 26.00 kg/m  Wt Readings from Last 3 Encounters:  03/27/20 186 lb 6.4 oz (84.6 kg)  03/07/20 200 lb 12.8 oz (91.1 kg)  02/25/20 202 lb (91.6 kg)     Health Maintenance Due  Topic Date Due  . OPHTHALMOLOGY EXAM  07/18/2019  . FOOT EXAM  09/08/2019  . INFLUENZA VACCINE  02/24/2020    There are no preventive care reminders to display for this patient.  Lab Results  Component Value Date   TSH 1.18 08/04/2016   Lab Results  Component Value Date   WBC 5.6 12/05/2019   HGB 13.3 12/05/2019   HCT 38.0 (L) 12/05/2019   MCV 85.2 12/05/2019   PLT 175.0 12/05/2019   Lab Results  Component Value Date   NA 138 12/05/2019   K 4.7 12/05/2019   CO2 26 12/05/2019   GLUCOSE 235 (H) 12/05/2019   BUN 29 (H) 12/05/2019   CREATININE 0.93 12/05/2019   BILITOT 0.4 12/05/2019   ALKPHOS 69 12/05/2019   AST 15 12/05/2019   ALT 14 12/05/2019   PROT 7.4 12/05/2019   ALBUMIN 4.3 12/05/2019   CALCIUM 10.1 12/05/2019   GFR 80.43 12/05/2019   Lab Results  Component Value Date   CHOL 198 07/23/2019   Lab Results  Component Value Date   HDL 42.60 07/23/2019   Lab Results  Component Value Date   LDLCALC 117 (H) 07/23/2019   Lab Results  Component Value Date   TRIG 191.0 (H) 07/23/2019   Lab Results  Component Value Date   CHOLHDL 5 07/23/2019   Lab Results  Component Value Date   HGBA1C 9.3 (H) 12/05/2019      Assessment & Plan:   Problem List Items Addressed This Visit      Cardiovascular and Mediastinum   Hypotension due to hypovolemia - Primary     Endocrine   Type 2 diabetes mellitus with hyperglycemia, with long-term current use of insulin (HCC)     Other   Lower abdominal pain   Constipation      No orders of the  defined types were placed in this encounter.   Follow-up: Return in about 1 week (around 04/03/2020), or Go to Emergency Room.[name], MD

## 2020-03-27 NOTE — ED Notes (Signed)
Spoke with pts daughter-updated her on POC

## 2020-03-27 NOTE — ED Triage Notes (Signed)
Lower abdominal pain with weakness and dizziness about two weeks ago.  No known fever.  Some nausea occasional dry heaves.  No diarrhea.  No blurry vision.  Dizziness with change in position.

## 2020-03-28 DIAGNOSIS — E875 Hyperkalemia: Secondary | ICD-10-CM

## 2020-03-28 DIAGNOSIS — R972 Elevated prostate specific antigen [PSA]: Secondary | ICD-10-CM

## 2020-03-28 DIAGNOSIS — Z794 Long term (current) use of insulin: Secondary | ICD-10-CM

## 2020-03-28 DIAGNOSIS — N179 Acute kidney failure, unspecified: Secondary | ICD-10-CM

## 2020-03-28 DIAGNOSIS — I9589 Other hypotension: Secondary | ICD-10-CM

## 2020-03-28 DIAGNOSIS — N410 Acute prostatitis: Secondary | ICD-10-CM

## 2020-03-28 DIAGNOSIS — E1142 Type 2 diabetes mellitus with diabetic polyneuropathy: Secondary | ICD-10-CM

## 2020-03-28 DIAGNOSIS — E861 Hypovolemia: Secondary | ICD-10-CM

## 2020-03-28 DIAGNOSIS — N419 Inflammatory disease of prostate, unspecified: Secondary | ICD-10-CM | POA: Diagnosis present

## 2020-03-28 DIAGNOSIS — R1030 Lower abdominal pain, unspecified: Secondary | ICD-10-CM

## 2020-03-28 LAB — COMPREHENSIVE METABOLIC PANEL
ALT: 17 U/L (ref 0–44)
AST: 18 U/L (ref 15–41)
Albumin: 3.9 g/dL (ref 3.5–5.0)
Alkaline Phosphatase: 54 U/L (ref 38–126)
Anion gap: 12 (ref 5–15)
BUN: 44 mg/dL — ABNORMAL HIGH (ref 8–23)
CO2: 20 mmol/L — ABNORMAL LOW (ref 22–32)
Calcium: 9.4 mg/dL (ref 8.9–10.3)
Chloride: 106 mmol/L (ref 98–111)
Creatinine, Ser: 1.31 mg/dL — ABNORMAL HIGH (ref 0.61–1.24)
GFR calc Af Amer: >60 mL/min (ref >60)
GFR calc non Af Amer: 55 mL/min — ABNORMAL LOW (ref >60)
Glucose, Bld: 296 mg/dL — ABNORMAL HIGH (ref 70–99)
Potassium: 4.4 mmol/L (ref 3.5–5.1)
Sodium: 138 mmol/L (ref 135–145)
Total Bilirubin: 0.7 mg/dL (ref 0.3–1.2)
Total Protein: 7 g/dL (ref 6.5–8.1)

## 2020-03-28 LAB — CBC
HCT: 28.8 % — ABNORMAL LOW (ref 39.0–52.0)
HCT: 30.2 % — ABNORMAL LOW (ref 39.0–52.0)
Hemoglobin: 9.8 g/dL — ABNORMAL LOW (ref 13.0–17.0)
Hemoglobin: 10.6 g/dL — ABNORMAL LOW (ref 13.0–17.0)
MCH: 29.4 pg (ref 26.0–34.0)
MCH: 30.2 pg (ref 26.0–34.0)
MCHC: 34 g/dL (ref 30.0–36.0)
MCHC: 35.1 g/dL (ref 30.0–36.0)
MCV: 86.5 fL (ref 80.0–100.0)
MCV: 86 fL (ref 80.0–100.0)
Platelets: 143 10*3/uL — ABNORMAL LOW (ref 150–400)
Platelets: 159 10*3/uL (ref 150–400)
RBC: 3.33 MIL/uL — ABNORMAL LOW (ref 4.22–5.81)
RBC: 3.51 MIL/uL — ABNORMAL LOW (ref 4.22–5.81)
RDW: 12.3 % (ref 11.5–15.5)
RDW: 12.3 % (ref 11.5–15.5)
WBC: 4.6 10*3/uL (ref 4.0–10.5)
WBC: 7.3 10*3/uL (ref 4.0–10.5)
nRBC: 0 % (ref 0.0–0.2)
nRBC: 0 % (ref 0.0–0.2)

## 2020-03-28 LAB — GLUCOSE, CAPILLARY
Glucose-Capillary: 154 mg/dL — ABNORMAL HIGH (ref 70–99)
Glucose-Capillary: 199 mg/dL — ABNORMAL HIGH (ref 70–99)
Glucose-Capillary: 207 mg/dL — ABNORMAL HIGH (ref 70–99)
Glucose-Capillary: 292 mg/dL — ABNORMAL HIGH (ref 70–99)
Glucose-Capillary: 294 mg/dL — ABNORMAL HIGH (ref 70–99)
Glucose-Capillary: 308 mg/dL — ABNORMAL HIGH (ref 70–99)

## 2020-03-28 LAB — HIV ANTIBODY (ROUTINE TESTING W REFLEX): HIV Screen 4th Generation wRfx: NONREACTIVE

## 2020-03-28 LAB — CREATININE, SERUM
Creatinine, Ser: 4.52 mg/dL — ABNORMAL HIGH (ref 0.61–1.24)
GFR calc Af Amer: 14 mL/min — ABNORMAL LOW (ref >60)
GFR calc non Af Amer: 12 mL/min — ABNORMAL LOW (ref >60)

## 2020-03-28 LAB — LACTIC ACID, PLASMA: Lactic Acid, Venous: 2.8 mmol/L (ref 0.5–1.9)

## 2020-03-28 LAB — PSA: Prostatic Specific Antigen: 10.41 ng/mL — ABNORMAL HIGH (ref 0.00–4.00)

## 2020-03-28 MED ORDER — HYDRALAZINE HCL 20 MG/ML IJ SOLN
10.0000 mg | Freq: Once | INTRAMUSCULAR | Status: AC
  Administered 2020-03-28: 10 mg via INTRAVENOUS
  Filled 2020-03-28: qty 1

## 2020-03-28 MED ORDER — TAMSULOSIN HCL 0.4 MG PO CAPS
0.4000 mg | ORAL_CAPSULE | Freq: Every day | ORAL | Status: DC
  Administered 2020-03-28 – 2020-03-29 (×2): 0.4 mg via ORAL
  Filled 2020-03-28 (×3): qty 1

## 2020-03-28 MED ORDER — TAMSULOSIN HCL 0.4 MG PO CAPS: 0.4000 mg | ORAL_CAPSULE | Freq: Every day | ORAL | Status: DC

## 2020-03-28 MED ORDER — GABAPENTIN 300 MG PO CAPS
300.0000 mg | ORAL_CAPSULE | Freq: Two times a day (BID) | ORAL | Status: DC
  Administered 2020-03-28 – 2020-03-29 (×3): 300 mg via ORAL
  Filled 2020-03-28 (×3): qty 1

## 2020-03-28 MED ORDER — LIDOCAINE HCL URETHRAL/MUCOSAL 2 % EX GEL
1.0000 "application " | Freq: Once | CUTANEOUS | Status: AC
  Administered 2020-03-28: 1 via URETHRAL
  Filled 2020-03-28: qty 11

## 2020-03-28 MED ORDER — INSULIN ASPART PROT & ASPART (70-30 MIX) 100 UNIT/ML ~~LOC~~ SUSP
32.0000 [IU] | Freq: Two times a day (BID) | SUBCUTANEOUS | Status: DC
  Administered 2020-03-28 – 2020-03-29 (×2): 32 [IU] via SUBCUTANEOUS

## 2020-03-28 MED ORDER — SODIUM CHLORIDE 0.9 % IV SOLN
2.0000 g | Freq: Every day | INTRAVENOUS | Status: DC
  Administered 2020-03-28: 2 g via INTRAVENOUS
  Filled 2020-03-28: qty 20
  Filled 2020-03-28: qty 2

## 2020-03-28 MED ORDER — COLCHICINE 0.6 MG PO TABS
0.6000 mg | ORAL_TABLET | Freq: Two times a day (BID) | ORAL | Status: DC
  Administered 2020-03-28: 0.6 mg via ORAL
  Filled 2020-03-28 (×3): qty 1

## 2020-03-28 MED ORDER — BISACODYL 10 MG RE SUPP
10.0000 mg | Freq: Once | RECTAL | Status: DC
  Filled 2020-03-28: qty 1

## 2020-03-28 MED ORDER — CIPROFLOXACIN IN D5W 400 MG/200ML IV SOLN
400.0000 mg | INTRAVENOUS | Status: DC
  Administered 2020-03-28: 400 mg via INTRAVENOUS

## 2020-03-28 MED ORDER — CHLORHEXIDINE GLUCONATE CLOTH 2 % EX PADS
6.0000 | MEDICATED_PAD | Freq: Every day | CUTANEOUS | Status: DC
  Administered 2020-03-28: 6 via TOPICAL

## 2020-03-28 NOTE — Progress Notes (Signed)
Lactic acid 2.8, pt had 499 on the bladder scanner, I&O performed but no output, pt has hx of BPH and his PSA value is very high, may need a Coude Foley instead, MD notified, will continue to monitor, Thanks Arvella Nigh RN.

## 2020-03-28 NOTE — Progress Notes (Addendum)
PROGRESS NOTE    Scott Donaldson   JIR:[id number]  DOB: 1951-04-04  DOA: 03/27/2020 PCP: [name], MD   Brief Narrative:  Scott Donaldson is a 69 y/o with DM2 who presents with weakness, dizziness, nausea and lower abdominal pain. Found to be orthostatic in the ED with elevated creatinine and started on IVF.  BUN 72, Cr 2.22, K+ 6.9.  Per patient and daugther, he has not been eating much in the past couple of weeks due to his lower abdominal pain which started about 2 wks ago. He is not passing much urine and "it hurts" to urinate. He has not had a BM in about 1 wk eiither. He had a prostate biopsy a few years ago which was normal.   Subjective: Having a great deal of suprapubic pain this AM. He has very little urine output and urinating is painful.    Assessment & Plan:   Principal Problem:   Acute kidney injury with metabolic acidosis and hyperkalemia - related to very poor oral intake at home with resultant dehydration- He has bladder outlet obstruction as well - bladder scan showing about 4-500 cc of urine in bladder but he is only passing about 5-10 cc of urine at a time - Cr 2.22 on admission- improving to 1.31 - K 6.9 in ED- has improved to normal after IVF and Lokelma - Benicar on hold - cont slow IVF today as he is not eating or drinking anything yet   Active Problems: Urinary obstruction with severely enlarged prostate/ lower abdominal pain - prostate is measuring about 7 x 6 cm - PSA is 10.4 - RN unable to pass I and O cath - Coude team called to place coude catheter - cont Ceftriaxone for possible prostatitis - cont Tamsulosin  - Addendum: evaluated in the afternoon- he is completely pain free after foley placement     Type 2 diabetes mellitus with diabetic polyneuropathy, with long-term current use of insulin - sugars uncontrolled today - cont Novolog 70/30 which is what he takes at home- will need to increase dose as sugars ranging in 300 range  this morning despite very poor oral intake  - Metformin and Farxiga on hold for now  Gout - Colchicine on hold due to AKI- will resume today  Constipation - Dulcolax suppository ordered   Time spent in minutes: 35 DVT prophylaxis: Heparin Code Status: Full code Family Communication: daughter at bedside Disposition Plan:  Status is: Inpatient  Remains inpatient appropriate because:IV treatments appropriate due to intensity of illness or inability to take PO and AKI- cont IVF until he begins to drink and eat.    Dispo: The patient is from: Home              Anticipated d/c is to: Home              Anticipated d/c date is: 1 day              Patient currently is not medically stable to d/c.      Consultants:  none Procedures:   none Antimicrobials:  Anti-infectives (From admission, onward)   Start     Dose/Rate Route Frequency Ordered Stop   03/28/20 0300  ciprofloxacin (CIPRO) IVPB 400 mg  Status:  Discontinued        400 mg 200 mL/hr over 60 Minutes Intravenous Every 24 hours 03/28/20 0250 03/28/20 0254   03/28/20 0300  cefTRIAXone (ROCEPHIN) 2 g in sodium chloride 0.9 % 100  mL IVPB        2 g 200 mL/hr over 30 Minutes Intravenous Daily 03/28/20 0255     03/27/20 2330  ciprofloxacin (CIPRO) IVPB 400 mg  Status:  Discontinued        400 mg 200 mL/hr over 60 Minutes Intravenous Every 12 hours 03/27/20 2243 03/28/20 0250       Objective: Vitals:   03/28/20 0223 03/28/20 0334 03/28/20 0600 03/28/20 0747  BP: (!) 165/81 (!) 185/78 (!) 169/69 (!) 174/77  Pulse: (!) 51 62 (!) 55 63  Resp:  19  20  Temp:  97.9 F (36.6 C)  98.7 F (37.1 C)  TempSrc:  Oral  Oral  SpO2:  99%  100%  Weight:  87 kg    Height:        Intake/Output Summary (Last 24 hours) at 03/28/2020 1022 Last data filed at 03/28/2020 1017 Gross per 24 hour  Intake 500 ml  Output 1025 ml  Net -525 ml   Filed Weights   03/27/20 0915 03/27/20 2158 03/28/20 0334  Weight: 84.5 kg 86.7 kg 87 kg     Examination: General exam: Appears comfortable  HEENT: PERRLA, oral mucosa moist, no sclera icterus or thrush Respiratory system: Clear to auscultation. Respiratory effort normal. Cardiovascular system: S1 & S2 heard, RRR.   Gastrointestinal system: Abdomen soft, non-tender, nondistended. Normal bowel sounds. Central nervous system: Alert and oriented. No focal neurological deficits. Extremities: No cyanosis, clubbing or edema Skin: No rashes or ulcers Psychiatry:  Mood & affect appropriate.     Data Reviewed: I have personally reviewed following labs and imaging studies  CBC: Recent Labs  Lab 03/27/20 0945 03/27/20 2315 03/28/20 0437  WBC 7.6 4.6 7.3  NEUTROABS 5.9  --   --   HGB 12.0* 9.8* 10.6*  HCT 35.8* 28.8* 30.2*  MCV 88.0 86.5 86.0  PLT 183 143* 182   Basic Metabolic Panel: Recent Labs  Lab 03/27/20 0945 03/27/20 1320 03/27/20 1800 03/27/20 2315 03/28/20 0437  NA 137 134* 134*  --  138  K 6.9* 5.1 4.9  --  4.4  CL 106 106 104  --  106  CO2 20* 17* 21*  --  20*  GLUCOSE 244* 287* 359*  --  296*  BUN 72* 67* 59*  --  44*  CREATININE 2.22* 1.89* 1.61* 4.52* 1.31*  CALCIUM 9.7 9.1 8.5*  --  9.4   GFR: Estimated Creatinine Clearance: 56.7 mL/min (A) (by C-G formula based on SCr of 1.31 mg/dL (H)). Liver Function Tests: Recent Labs  Lab 03/27/20 0945 03/28/20 0437  AST 18 18  ALT 18 17  ALKPHOS 60 54  BILITOT 0.5 0.7  PROT 8.5* 7.0  ALBUMIN 4.8 3.9   Recent Labs  Lab 03/27/20 0945  LIPASE 42   No results for input(s): AMMONIA in the last 168 hours. Coagulation Profile: Recent Labs  Lab 03/27/20 0945  INR 1.1   Cardiac Enzymes: Recent Labs  Lab 03/27/20 0945  CKTOTAL 59   BNP (last 3 results) No results for input(s): PROBNP in the last 8760 hours. HbA1C: Recent Labs    03/27/20 2315  HGBA1C 8.4*   CBG: Recent Labs  Lab 03/27/20 1125 03/28/20 0020 03/28/20 0623  GLUCAP 217* 292* 308*   Lipid Profile: No results for  input(s): CHOL, HDL, LDLCALC, TRIG, CHOLHDL, LDLDIRECT in the last 72 hours. Thyroid Function Tests: No results for input(s): TSH, T4TOTAL, FREET4, T3FREE, THYROIDAB in the last 72 hours. Anemia Panel: No results  for input(s): VITAMINB12, FOLATE, FERRITIN, TIBC, IRON, RETICCTPCT in the last 72 hours. Urine analysis:    Component Value Date/Time   COLORURINE YELLOW 03/27/2020 1430   APPEARANCEUR CLEAR 03/27/2020 1430   LABSPEC 1.020 03/27/2020 1430   PHURINE 5.0 03/27/2020 1430   GLUCOSEU >=500 (A) 03/27/2020 1430   GLUCOSEU 250 (A) 12/05/2019 1117   HGBUR NEGATIVE 03/27/2020 1430   BILIRUBINUR NEGATIVE 03/27/2020 1430   KETONESUR NEGATIVE 03/27/2020 1430   PROTEINUR NEGATIVE 03/27/2020 1430   UROBILINOGEN 0.2 12/05/2019 1117   NITRITE NEGATIVE 03/27/2020 1430   LEUKOCYTESUR NEGATIVE 03/27/2020 1430   Sepsis Labs: @LABRCNTIP (procalcitonin:4,lacticidven:4) ) Recent Results (from the past 240 hour(s))  SARS Coronavirus 2 by RT PCR (hospital order, performed in [facility] hospital lab) Nasopharyngeal Nasopharyngeal Swab     Status: None   Collection Time: 03/27/20 11:32 AM   Specimen: Nasopharyngeal Swab  Result Value Ref Range Status   SARS Coronavirus 2 NEGATIVE NEGATIVE Final    Comment: (NOTE) SARS-CoV-2 target nucleic acids are NOT DETECTED.  The SARS-CoV-2 RNA is generally detectable in upper and lower respiratory specimens during the acute phase of infection. The lowest concentration of SARS-CoV-2 viral copies this assay can detect is 250 copies / mL. A negative result does not preclude SARS-CoV-2 infection and should not be used as the sole basis for treatment or other patient management decisions.  A negative result may occur with improper specimen collection / handling, submission of specimen other than nasopharyngeal swab, presence of viral mutation(s) within the areas targeted by this assay, and inadequate number of viral copies (<250 copies / mL). A negative  result must be combined with clinical observations, patient history, and epidemiological information.  Fact Sheet for Patients:   StrictlyIdeas.no  Fact Sheet for Healthcare Providers: BankingDealers.co.za  This test is not yet approved or  cleared by the [place] FDA and has been authorized for detection and/or diagnosis of SARS-CoV-2 by FDA under an Emergency Use Authorization (EUA).  This EUA will remain in effect (meaning this test can be used) for the duration of the COVID-19 declaration under Section 564(b)(1) of the Act, 21 U.S.C. section 360bbb-3(b)(1), unless the authorization is terminated or revoked sooner.  Performed at [facility], [address] [place], [place] [address]          Radiology Studies: CT ABDOMEN PELVIS WO CONTRAST  Result Date: 03/27/2020 CLINICAL DATA:  Suspected diverticulitis. EXAM: CT ABDOMEN AND PELVIS WITHOUT CONTRAST TECHNIQUE: Multidetector CT imaging of the abdomen and pelvis was performed following the standard protocol without IV contrast. COMPARISON:  Abdominal sonogram from 2018 FINDINGS: Lower chest: Lung bases are clear.  No effusion.  No consolidation. Hepatobiliary: Noncontrast appearance of liver and gallbladder is normal. No gross biliary duct distension. Pancreas: Pancreas normal contour, no peripancreatic inflammation. No ductal dilation. Spleen: Spleen normal in size and contour. Adrenals/Urinary Tract: Adrenal glands are normal. Mild perinephric stranding bilaterally, nonspecific finding. Urinary bladder under distended with impression upon the bladder base from a markedly enlarged prostate. No hydronephrosis. No nephrolithiasis. No ureteral calculi. Stomach/Bowel: Stomach under distended. Small bowel without dilation or signs of inflammation. Normal appendix. Scattered colonic diverticulosis without signs of diverticulitis Vascular/Lymphatic: Atheromatous plaque of the  abdominal aorta with calcification. No aneurysmal dilation. No adenopathy in the retroperitoneum. No adenopathy in the pelvis. Reproductive: Markedly enlarged prostate, prostate measuring approximately 7.4 x 6.0 x 6.4 cm. Mild periprosthetic stranding may be present. Findings are nonspecific on CT. Other: Mild stranding in the subcutaneous fat  of the anterior abdominal wall in the midline with some skin thickening. No ascites. Small fat containing umbilical hernia. Musculoskeletal: Spinal degenerative changes. No acute or destructive bone finding. IMPRESSION: 1. Markedly enlarged prostate, prostate measuring approximately 7.4 x 6.0 x 6.4 cm. Mild periprosthetic stranding may be present. Findings are nonspecific on CT. Correlate with any clinical or laboratory evidence of prostatitis in the setting of lower abdominal pain. 2. Mild stranding in the subcutaneous fat of the anterior abdominal wall in the midline with some skin thickening. Findings could be related to injection sites. Correlate with any signs of cellulitis. 3. No signs of nephrolithiasis or hydronephrosis. 4. Normal appendix. 5. Diverticulosis without diverticulitis. 6. Aortic atherosclerosis. Aortic Atherosclerosis (ICD10-I70.0). Electronically Signed   By: Zetta Bills M.D.   On: 03/27/2020 11:25   CT Head Wo Contrast  Result Date: 03/27/2020 CLINICAL DATA:  Nonspecific dizziness for 2 weeks EXAM: CT HEAD WITHOUT CONTRAST TECHNIQUE: Contiguous axial images were obtained from the base of the skull through the vertex without intravenous contrast. COMPARISON:  Weeks 06/15/2018 FINDINGS: Brain: No evidence of acute infarction, hemorrhage, hydrocephalus, extra-axial collection or mass lesion/mass effect. Vascular: No hyperdense vessel or unexpected calcification. Skull: Normal. Negative for fracture or focal lesion. Sinuses/Orbits: No acute finding. IMPRESSION: Negative head CT. Electronically Signed   By: Monte Fantasia M.D.   On: 03/27/2020 11:21    DG Chest Portable 1 View  Result Date: 03/27/2020 CLINICAL DATA:  Dizziness EXAM: PORTABLE CHEST 1 VIEW COMPARISON:  08/04/2016 FINDINGS: The heart size and mediastinal contours are within normal limits. 9 mm nodular density projects over the periphery of the right lower lung field, and is favored to represent a nipple shadow. The lungs are otherwise clear. The visualized skeletal structures are unremarkable. IMPRESSION: 1. No active cardiopulmonary disease. 2. 9 mm nodular density projects over the periphery of the right lower lung field, and is favored to represent a nipple shadow. Repeat radiograph with nipple markers could be performed to confirm. Electronically Signed   By: Davina Poke D.O.   On: 03/27/2020 11:09      Scheduled Meds: . aspirin EC  81 mg Oral Daily  . atorvastatin  40 mg Oral Daily  . bisacodyl  10 mg Rectal Once  . heparin  5,000 Units Subcutaneous Q8H  . insulin aspart  0-5 Units Subcutaneous QHS  . insulin aspart  0-9 Units Subcutaneous TID WC  . insulin aspart protamine- aspart  28 Units Subcutaneous BID WC  . lidocaine  1 application Urethral Once  . metoprolol tartrate  12.5 mg Oral BID  . tamsulosin  0.4 mg Oral Daily   Continuous Infusions: . cefTRIAXone (ROCEPHIN)  IV 2 g (03/28/20 0353)  . sodium bicarbonate (isotonic) 150 mEq in D5W 1000 mL infusion 100 mL/hr at 03/27/20 1243     LOS: 1 day      Debbe Odea, MD [facility] Pager: [email or web address] 03/28/2020, 10:22 AM

## 2020-03-28 NOTE — H&P (Signed)
Scott Donaldson is an 69 y.o. male.   Chief Complaint: Dizziness and abdominal pain x 2 weeks. HPI: The patient is a 69 yr old man who presented with the above chief complaints. He states that he has had intermittent issues with hypotension. Currently he seems particularly orthostatic. He states that his abdominal pain is low and constant. He states that he is having trouble emptying his bladder lately. He states that he does have increased pain with bowel movements. He is aware of having an elevated PSA in the past. He states that he has had a prostate biopsy and that it was "normal"  In the ED the patient was found to be orthostatic and profoundly hypotensive. He states that he gets dizzy with sitting up. This responded to IV fluid boluses as did his elevated lactic acid of 3.4. He also had AKI with a creatinine of 2.22. CT of the patient's abdomen and pelvis demonstrated a very large prostate with periprostatic stranding consistent with prostatitis.   [facility] hospitalists were consulted to admit the patient for further evaluation and treatment.  He admits to fevers, chills, nausea, no vomiting, lack of appetite, difficulty emptying his bladder. No chest pain, cough, shortness of breath, neurological abnormalities.  Past Medical History:  Diagnosis Date  . Abdominal pain 08/04/2016  . Abnormal PSA 05/19/2015  . Arthralgia 11/12/2010  . Benign prostatic hyperplasia with urinary hesitancy 06/14/2018  . Cataract   . CELLULITIS, FOOT, RIGHT 07/02/2008   Qualifier: Diagnosis of  By: Marca Ancona RMA, Lucy    . Cough 08/04/2016  . Diabetes ([facility]) 03/04/2007   Qualifier: Diagnosis of  By: Loanne Drilling MD, Jacelyn Pi   . DIABETES MELLITUS, TYPE II 03/04/2007  . Edema 07/02/2008   Qualifier: Diagnosis of  By: Marca Ancona RMA, Lucy    . Elevated LDL cholesterol level 06/14/2018  . Foot ulcer, right ([place]) 04/15/2014  . GERD (gastroesophageal reflux disease)   . HYPERCHOLESTEROLEMIA 07/29/2008  . HYPERTENSION 03/04/2007  . Knee  pain, left 05/16/2015  . Loss of vision 04/15/2014  . Nonintractable episodic headache 12/01/2016  . Psoriasis   . SHOULDER PAIN, BILATERAL 01/12/2010   Qualifier: Diagnosis of  By: Loanne Drilling MD, Jacelyn Pi   . Swelling of left foot 08/05/2017  . 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 COLONOSCOPY    . LEFT HEART CATH AND CORONARY ANGIOGRAPHY N/A 07/12/2018   Procedure: LEFT HEART CATH AND CORONARY ANGIOGRAPHY;  Surgeon: Wellington Hampshire, MD;  Location: [facility];  Service: Cardiovascular;  Laterality: N/A;  . stress cardiolite  05/30/2003    Family History  Problem Relation Age of Onset  . Cancer Mother        Breast Cancer  . Cancer Father        uncertain type  . Diabetes Father   . Cancer Brother 45       Colon Cancer  . Colon cancer Neg Hx   . Rectal cancer Neg Hx   . Stomach cancer Neg Hx    Social History:  reports that he quit smoking about 31 years ago. He has never used smokeless tobacco. He reports that he does not drink alcohol and does not use drugs. Medications Prior to Admission  Medication Sig Dispense Refill  . aspirin EC 81 MG tablet Take 1 tablet (81 mg total) by mouth daily.    [name] atorvastatin (LIPITOR)  40 MG tablet Take 1 tablet (40 mg total) by mouth daily. 90 tablet 3  . colchicine 0.6 MG tablet Take 1 tablet (0.6 mg total) by mouth 2 (two) times daily. 60 tablet 2  . dapagliflozin propanediol (FARXIGA) 5 MG TABS tablet Take 5 mg by mouth daily before breakfast. (Patient not taking: Reported on 03/27/2020) 30 tablet 4  . diclofenac Sodium (VOLTAREN) 1 % GEL Apply a small grape sized dollop to tender space of right knee up to 4 times daily. 150 g 1  . gabapentin (NEURONTIN) 300 MG capsule Take one at night for one week and then increase to one twice daily. 60 capsule 3  . glucose blood test strip USE TO CHECK BLOOD SUGAR TWICE DAILY 100 each 12  .  insulin NPH-regular Human (70-30) 100 UNIT/ML injection Inject 36 Units into the skin 2 (two) times daily with a meal. 10 mL 11  . metFORMIN (GLUCOPHAGE-XR) 500 MG 24 hr tablet Take 2 tablets (1,000 mg total) by mouth 2 (two) times daily. 360 tablet 3  . metoprolol succinate (TOPROL-XL) 50 MG 24 hr tablet Take 1 tablet (50 mg total) by mouth daily. Take with or immediately following a meal. 90 tablet 3  . olmesartan (BENICAR) 20 MG tablet TAKE 1 TABLET(20 MG) BY MOUTH DAILY 90 tablet 0  . tamsulosin (FLOMAX) 0.4 MG CAPS capsule Take 0.4 mg by mouth daily. (Patient not taking: Reported on 03/27/2020)    . traMADol (ULTRAM) 50 MG tablet Take one at night as needed. (Patient not taking: Reported on 03/27/2020) 15 tablet 0    Allergies: No Known Allergies  Pertinent items noted in HPI and remainder of comprehensive ROS otherwise negative.   General appearance: alert, cooperative and mild distress Head: Normocephalic, without obvious abnormality, atraumatic Eyes: conjunctivae/corneas clear. PERRL, EOM's intact. Fundi benign. Throat: lips, mucosa, and tongue normal; teeth and gums normal Neck: no adenopathy, no carotid bruit, no JVD, supple, symmetrical, trachea midline and thyroid not enlarged, symmetric, no tenderness/mass/nodules Resp: No increased work of breathing. No wheezes, rales, or rhonchi. No tactile fremitus. Chest wall: no tenderness Cardio: regular rate and rhythm, S1, S2 normal, no murmur, click, rub or gallop GI: soft, non-tender; bowel sounds normal; no masses,  no organomegaly Extremities: extremities normal, atraumatic, no cyanosis or edema Pulses: 2+ and symmetric Skin: Skin color, texture, turgor normal. No rashes or lesions Lymph nodes: Cervical, supraclavicular, and axillary nodes normal. Neurologic: Grossly normal  Results for orders placed or performed during the hospital encounter of 03/27/20 (from the past 48 hour(s))  Lipase, blood     Status: None   Collection Time:  03/27/20  9:45 AM  Result Value Ref Range   Lipase 42 11 - 51 U/L    Comment: Performed at [facility], [address] [place], [place] [address]  Comprehensive metabolic panel     Status: Abnormal   Collection Time: 03/27/20  9:45 AM  Result Value Ref Range   Sodium 137 135 - 145 mmol/L   Potassium 6.9 (HH) 3.5 - 5.1 mmol/L    Comment: REPEATED TO VERIFY CRITICAL RESULT CALLED TO, READ BACK BY AND VERIFIED WITH: CALLED TO M.SIMMS AT 1045 ON [id number] BY SROY    Chloride 106 98 - 111 mmol/L   CO2 20 (L) 22 - 32 mmol/L   Glucose, Bld 244 (H) 70 - 99 mg/dL    Comment: Glucose reference range applies only to samples taken after fasting for at least 8 hours.  BUN 72 (H) 8 - 23 mg/dL   Creatinine, Ser 2.22 (H) 0.61 - 1.24 mg/dL   Calcium 9.7 8.9 - 10.3 mg/dL   Total Protein 8.5 (H) 6.5 - 8.1 g/dL   Albumin 4.8 3.5 - 5.0 g/dL   AST 18 15 - 41 U/L   ALT 18 0 - 44 U/L   Alkaline Phosphatase 60 38 - 126 U/L   Total Bilirubin 0.5 0.3 - 1.2 mg/dL   GFR calc non Af Amer 29 (L) >60 mL/min   GFR calc Af Amer 34 (L) >60 mL/min   Anion gap 11 5 - 15    Comment: Performed at [facility], [address] [place], [place] [address]  CBC     Status: Abnormal   Collection Time: 03/27/20  9:45 AM  Result Value Ref Range   WBC 7.6 4.0 - 10.5 K/uL   RBC 4.07 (L) 4.22 - 5.81 MIL/uL   Hemoglobin 12.0 (L) 13.0 - 17.0 g/dL   HCT 35.8 (L) 39 - 52 %   MCV 88.0 80.0 - 100.0 fL   MCH 29.5 26.0 - 34.0 pg   MCHC 33.5 30.0 - 36.0 g/dL   RDW 12.4 11.5 - 15.5 %   Platelets 183 150 - 400 K/uL   nRBC 0.0 0.0 - 0.2 %    Comment: Performed at [facility], [address] [place], [place] [address]  Protime-INR     Status: None   Collection Time: 03/27/20  9:45 AM  Result Value Ref Range   Prothrombin Time 13.4 11.4 - 15.2 seconds   INR 1.1 0.8 - 1.2    Comment: (NOTE) INR goal varies based on device and disease states. Performed at [facility], [address] [place], [place] [address]   APTT     Status: None   Collection Time: 03/27/20  9:45 AM  Result Value Ref Range   aPTT 25 24 - 36 seconds    Comment: Performed at [facility], [address] [place], [place] [address]  Differential     Status: None   Collection Time: 03/27/20  9:45 AM  Result Value Ref Range   Neutrophils Relative % 77 %   Neutro Abs 5.9 1.7 - 7.7 K/uL   Lymphocytes Relative 15 %   Lymphs Abs 1.2 0.7 - 4.0 K/uL   Monocytes Relative 6 %   Monocytes Absolute 0.5 0 - 1 K/uL   Eosinophils Relative 1 %   Eosinophils Absolute 0.1 0 - 0 K/uL   Basophils Relative 1 %   Basophils Absolute 0.0 0 - 0 K/uL   Immature Granulocytes 0 %   Abs Immature Granulocytes 0.02 0.00 - 0.07 K/uL    Comment: Performed at [facility], [address] [place], [place] [address]  CK     Status: None   Collection Time: 03/27/20  9:45 AM  Result Value Ref Range   Total CK 59 49.0 - 397.0 U/L    Comment: Performed at [facility], [address] [place], [place] [address]  CBG monitoring, ED     Status: Abnormal   Collection Time: 03/27/20 11:25 AM  Result Value Ref Range   Glucose-Capillary 217 (H) 70 - 99 mg/dL    Comment: Glucose reference range applies only to samples taken after fasting for at least 8 hours.  SARS Coronavirus 2 by RT PCR (hospital order, performed in  [facility] hospital lab) Nasopharyngeal Nasopharyngeal Swab     Status: None   Collection Time: 03/27/20 11:32 AM   Specimen: Nasopharyngeal Swab  Result Value Ref Range   SARS Coronavirus 2 NEGATIVE NEGATIVE    Comment: (NOTE) SARS-CoV-2 target nucleic acids are NOT DETECTED.  The SARS-CoV-2 RNA is generally detectable in upper and lower respiratory specimens during the acute phase of infection. The lowest concentration of SARS-CoV-2 viral copies this assay can detect is 250 copies / mL. A negative result does not preclude SARS-CoV-2 infection and should not be used as the  sole basis for treatment or other patient management decisions.  A negative result may occur with improper specimen collection / handling, submission of specimen other than nasopharyngeal swab, presence of viral mutation(s) within the areas targeted by this assay, and inadequate number of viral copies (<250 copies / mL). A negative result must be combined with clinical observations, patient history, and epidemiological information.  Fact Sheet for Patients:   StrictlyIdeas.no  Fact Sheet for Healthcare Providers: BankingDealers.co.za  This test is not yet approved or  cleared by the [place] FDA and has been authorized for detection and/or diagnosis of SARS-CoV-2 by FDA under an Emergency Use Authorization (EUA).  This EUA will remain in effect (meaning this test can be used) for the duration of the COVID-19 declaration under Section 564(b)(1) of the Act, 21 U.S.C. section 360bbb-3(b)(1), unless the authorization is terminated or revoked sooner.  Performed at [facility], [address] [place], [place] [address]   Basic metabolic panel     Status: Abnormal   Collection Time: 03/27/20  1:20 PM  Result Value Ref Range   Sodium 134 (L) 135 - 145 mmol/L   Potassium 5.1 3.5 - 5.1 mmol/L    Comment: DELTA CHECK NOTED   Chloride 106 98 - 111 mmol/L   CO2 17 (L) 22 - 32 mmol/L   Glucose, Bld 287 (H) 70 - 99 mg/dL    Comment: Glucose reference range applies only to samples taken after fasting for at least 8 hours.   BUN 67 (H) 8 - 23 mg/dL   Creatinine, Ser 1.89 (H) 0.61 - 1.24 mg/dL   Calcium 9.1 8.9 - 10.3 mg/dL   GFR calc non Af Amer 35 (L) >60 mL/min   GFR calc Af Amer 41 (L) >60 mL/min   Anion gap 11 5 - 15    Comment: Performed at [facility], [address] [place], [place] [address]  Urinalysis, Routine w reflex microscopic Urine, Clean Catch     Status: Abnormal   Collection Time: 03/27/20  2:30  PM  Result Value Ref Range   Color, Urine YELLOW YELLOW   APPearance CLEAR CLEAR   Specific Gravity, Urine 1.020 1.005 - 1.030   pH 5.0 5.0 - 8.0   Glucose, UA >=500 (A) NEGATIVE mg/dL   Hgb urine dipstick NEGATIVE NEGATIVE   Bilirubin Urine NEGATIVE NEGATIVE   Ketones, ur NEGATIVE NEGATIVE mg/dL   Protein, ur NEGATIVE NEGATIVE mg/dL   Nitrite NEGATIVE NEGATIVE   Leukocytes,Ua NEGATIVE NEGATIVE    Comment: Performed at [facility], [address] [place], [place] [address]  Urinalysis, Microscopic (reflex)     Status: Abnormal   Collection Time: 03/27/20  2:30 PM  Result Value Ref Range   RBC / HPF 0-5 0 - 5 RBC/hpf   WBC, UA NONE SEEN 0 - 5 WBC/hpf   Bacteria, UA RARE (A)  NONE SEEN   Squamous Epithelial / LPF NONE SEEN 0 - 5   Mucus PRESENT    Hyaline Casts, UA PRESENT     Comment: Performed at [facility], [address] [place], [place] [address]  Basic metabolic panel     Status: Abnormal   Collection Time: 03/27/20  6:00 PM  Result Value Ref Range   Sodium 134 (L) 135 - 145 mmol/L   Potassium 4.9 3.5 - 5.1 mmol/L   Chloride 104 98 - 111 mmol/L   CO2 21 (L) 22 - 32 mmol/L   Glucose, Bld 359 (H) 70 - 99 mg/dL    Comment: Glucose reference range applies only to samples taken after fasting for at least 8 hours.   BUN 59 (H) 8 - 23 mg/dL   Creatinine, Ser 1.61 (H) 0.61 - 1.24 mg/dL   Calcium 8.5 (L) 8.9 - 10.3 mg/dL   GFR calc non Af Amer 43 (L) >60 mL/min   GFR calc Af Amer 50 (L) >60 mL/min   Anion gap 9 5 - 15    Comment: Performed at [facility], [address] [place], [place] [address]  Creatinine, serum     Status: Abnormal   Collection Time: 03/27/20 11:15 PM  Result Value Ref Range   Creatinine, Ser 4.52 (H) 0.61 - 1.24 mg/dL   GFR calc non Af Amer 12 (L) >60 mL/min   GFR calc Af Amer 14 (L) >60 mL/min    Comment: Performed at [facility] [address], [place], [place] [address]  PSA     Status: Abnormal    Collection Time: 03/27/20 11:15 PM  Result Value Ref Range   Prostatic Specific Antigen 10.41 (H) 0.00 - 4.00 ng/mL    Comment: (NOTE) While PSA levels of <=4.0 ng/ml are reported as reference range, some men with levels below 4.0 ng/ml can have prostate cancer and many men with PSA above 4.0 ng/ml do not have prostate cancer.  Other tests such as free PSA, age specific reference ranges, PSA velocity and PSA doubling time may be helpful especially in men less than 46 years old. Performed at [facility], [place] [address] [place], [place] [address]   Hemoglobin A1c     Status: Abnormal   Collection Time: 03/27/20 11:15 PM  Result Value Ref Range   Hgb A1c MFr Bld 8.4 (H) 4.8 - 5.6 %    Comment: (NOTE) Pre diabetes:          5.7%-6.4%  Diabetes:              >6.4%  Glycemic control for   <7.0% adults with diabetes    Mean Plasma Glucose 194.38 mg/dL    Comment: Performed at [facility] [address], [place], [place] [address]  Glucose, capillary     Status: Abnormal   Collection Time: 03/28/20 12:20 AM  Result Value Ref Range   Glucose-Capillary 292 (H) 70 - 99 mg/dL    Comment: Glucose reference range applies only to samples taken after fasting for at least 8 hours.   Comment 1 Notify RN    Comment 2 Document in Chart    @[email or web address] @  Blood pressure (!) 165/81, pulse (!) 51, temperature 98.1 F (36.7 C), temperature source Oral, resp. rate 18, height 5\' 11"  (1.803 m), weight 86.7 kg, SpO2 98 %.   Assessment/Plan Problem  Prostatitis  Hypotension Due to Hypovolemia  Type 2 Diabetes  Mellitus With Diabetic Polyneuropathy, With Long-Term Current Use of Insulin ([facility])  Diabetes Mellitus ([facility])  Lower Abdominal Pain  Abnormal Psa   Hypotension: Due to volume depletion. Improved with IV fluids. Monitor  AKI: Due to volume depletion/ATN. The patient is receiving IV fluids. Monitor creatinine, electrolytes, and volume status. Avoid nephrotoxic medications and  hypotension. The patient has been using colchicine at home.  Dizziness: CT head negative. Likely due to orthostatic hypotension. PT/OT eval and treat when appropriate.  Lower Abdominal Pain/ abnormal PSA/Likely Prostatitis: The patient has had a biopsy of his prostate that he states was negative. Started on Ceftriaxone 2 g daily. Will check lactic acid.  DM II: Glucoses will be followed with FSBS and SSI. He is on metformin and farxiga at home as well as novolog 70/30 novolog will be continued at a lower dose than at home.   Bradycardia: Will give the patient a lower dose of metoprolol as his heart rate is in the 50's and 60's. This may also be contributing to his hypotension and dizziness.  I have seen and examined this patient myself. I have spent 78 minutes in his evaluation and care.  DVT Prophylaxis: Heparin CODE STATUS: Full Code Family Communication: None available Disposition: Patient will be admitted as inpatient to a telemetry bed.  Kasiya Burck 03/28/2020, 2:33 AM

## 2020-03-29 DIAGNOSIS — E1159 Type 2 diabetes mellitus with other circulatory complications: Secondary | ICD-10-CM

## 2020-03-29 LAB — BASIC METABOLIC PANEL
Anion gap: 11 (ref 5–15)
BUN: 26 mg/dL — ABNORMAL HIGH (ref 8–23)
CO2: 26 mmol/L (ref 22–32)
Calcium: 8.7 mg/dL — ABNORMAL LOW (ref 8.9–10.3)
Chloride: 103 mmol/L (ref 98–111)
Creatinine, Ser: 1.11 mg/dL (ref 0.61–1.24)
GFR calc Af Amer: >60 mL/min (ref >60)
GFR calc non Af Amer: >60 mL/min (ref >60)
Glucose, Bld: 168 mg/dL — ABNORMAL HIGH (ref 70–99)
Potassium: 3.8 mmol/L (ref 3.5–5.1)
Sodium: 140 mmol/L (ref 135–145)

## 2020-03-29 LAB — CBC
HCT: 30 % — ABNORMAL LOW (ref 39.0–52.0)
Hemoglobin: 9.9 g/dL — ABNORMAL LOW (ref 13.0–17.0)
MCH: 29.1 pg (ref 26.0–34.0)
MCHC: 33 g/dL (ref 30.0–36.0)
MCV: 88.2 fL (ref 80.0–100.0)
Platelets: 129 10*3/uL — ABNORMAL LOW (ref 150–400)
RBC: 3.4 MIL/uL — ABNORMAL LOW (ref 4.22–5.81)
RDW: 12.1 % (ref 11.5–15.5)
WBC: 6.7 10*3/uL (ref 4.0–10.5)
nRBC: 0 % (ref 0.0–0.2)

## 2020-03-29 LAB — GLUCOSE, CAPILLARY
Glucose-Capillary: 112 mg/dL — ABNORMAL HIGH (ref 70–99)
Glucose-Capillary: 113 mg/dL — ABNORMAL HIGH (ref 70–99)
Glucose-Capillary: 95 mg/dL (ref 70–99)
Glucose-Capillary: 149 mg/dL — ABNORMAL HIGH (ref 70–99)

## 2020-03-29 LAB — URINE CULTURE: Culture: NO GROWTH

## 2020-03-29 NOTE — Discharge Summary (Signed)
Physician Discharge Summary  Scott Donaldson KKX:[id number] DOB: 1951-05-27 DOA: 03/27/2020  PCP: [name], MD  Admit date: 03/27/2020 Discharge date: 03/29/2020  Admitted From: home  Disposition:  home   Recommendations for Outpatient Follow-up:  1. Needs close f/u for his BPH  Home Health:  none  Discharge Condition:  stable   CODE STATUS:  Full code   Diet recommendation:  Heart health and diabetic diet Consultations:  none  Procedures/Studies: . Foley cath placement   Discharge Diagnoses:  Principal Problem:   Acute kidney injury ([place]) Active Problems:   Hypotension due to hypovolemia   Hyperkalemia   Abnormal PSA   Lower abdominal pain   Type 2 diabetes mellitus with diabetic polyneuropathy, with long-term current use of insulin    Brief Summary: Scott Donaldson is a 69 y/o with DM2 who presents with weakness, dizziness, nausea and lower abdominal pain. These symptoms, especially the lower abdominal pain has been progressing over the past few days and now his pain is severe and his dizziness is constant and worse with ambulating. Found to be orthostatic in the ED with elevated creatinine.  BUN 72, Cr 2.22, K+ 6.9.  Per patient and daugther, he has not been eating much in the past couple of weeks due to his lower abdominal pain. He is not passing much urine and "it hurts" to urinate. He has not had a BM in about 1 wk either.   He was started on IVF in the ED.  CT of the abdomen/pelvis: Markedly enlarged prostate, prostate measuring approximately 7.4 x 6.0 x 6.4 cm. Mild periprosthetic stranding may be present. He was started on Ceftriaxone for possible prostatitis.  Hospital Course:  Principal Problem:   Acute kidney injury with metabolic acidosis and hyperkalemia - related to very poor oral intake at home with resultant dehydration- He has bladder outlet obstruction as well - bladder scan showing about 4-500 cc of urine in bladder but he is only  passing about 5-10 cc of urine at a time - Cr 2.22 on admission- improving to 1.31 and 1.11 after being aggressively hydrated - K 6.9 in ED- has improved to normal after IVF and Lokelma - Benicar on hold - He has began to eat and drink now   Active Problems: Urinary obstruction with severely enlarged prostate/ lower abdominal pain - prostate is measuring about 7 x 6 cm - PSA is 10.4 - RN unable to pass I and O cath - Coude team called to place coude catheter- once cath was placed and urine emptied from bladder, his abdominal pain resolved - he was started on Ceftriaxone for possible prostatitis however I feel that his pain was mainly coming from urinary retention as it resolved immediately after the catheter was placed- furthermore his US shows rare bacteria and no WBC and there is no leukocytosis- will stop antibiotics - cont Tamsulosin  - he will f/u as outpt for further management of prostatitis  HTN - he has been bradycardic with HR in high 40- 50s and thus his Metoprolol is on hold - Benicar was initially held due to AKI- he can resume this tomorrow     Type 2 diabetes mellitus with diabetic polyneuropathy, with long-term current use of insulin - sugars uncontrolled   - cont Novolog 70/30,  Metformin and Farxiga   - A1c is 8.4 signifying poor control   Gout - he states he is no longer taking Colchicine  Constipation - Dulcolax suppository ordered  Discharge Exam: Vitals:   03/29/20 0432 03/29/20 0812  BP: 112/61 (!) 136/51  Pulse: (!) 52 (!) 50  Resp: 19   Temp: 97.9 F (36.6 C)   SpO2: 97% 96%   Vitals:   03/28/20 1508 03/28/20 2034 03/29/20 0432 03/29/20 0812  BP: (!) 131/53 (!) 121/54 112/61 (!) 136/51  Pulse: (!) 52 (!) 53 (!) 52 (!) 50  Resp: 20 20 19    Temp: 98.3 F (36.8 C)  97.9 F (36.6 C)   TempSrc: Oral     SpO2: 99% 96% 97% 96%  Weight:   86.1 kg   Height:        General: Pt is alert, awake, not in acute distress Cardiovascular: RRR,  S1/S2 +, no rubs, no gallops Respiratory: CTA bilaterally, no wheezing, no rhonchi Abdominal: Soft, NT, ND, bowel sounds + Extremities: no edema, no cyanosis   Discharge Instructions  Discharge Instructions    Diet - low sodium heart healthy   Complete by: As directed    Diet Carb Modified   Complete by: As directed    Increase activity slowly   Complete by: As directed      Allergies as of 03/29/2020   No Known Allergies     Medication List    STOP taking these medications   metoprolol succinate 50 MG 24 hr tablet Commonly known as: TOPROL-XL     TAKE these medications   aspirin EC 81 MG tablet Take 1 tablet (81 mg total) by mouth daily.   atorvastatin 40 MG tablet Commonly known as: LIPITOR Take 1 tablet (40 mg total) by mouth daily.   colchicine 0.6 MG tablet Take 1 tablet (0.6 mg total) by mouth 2 (two) times daily.   diclofenac Sodium 1 % Gel Commonly known as: Voltaren Apply a small grape sized dollop to tender space of right knee up to 4 times daily. What changed:   how much to take  how to take this  when to take this  reasons to take this   Farxiga 5 MG Tabs tablet Generic drug: dapagliflozin propanediol Take 5 mg by mouth daily before breakfast.   gabapentin 300 MG capsule Commonly known as: NEURONTIN Take one at night for one week and then increase to one twice daily. What changed:   how much to take  how to take this  when to take this  additional instructions   glucose blood test strip USE TO CHECK BLOOD SUGAR TWICE DAILY   insulin NPH-regular Human (70-30) 100 UNIT/ML injection Inject 36 Units into the skin 2 (two) times daily with a meal.   metFORMIN 500 MG 24 hr tablet Commonly known as: GLUCOPHAGE-XR Take 2 tablets (1,000 mg total) by mouth 2 (two) times daily.   olmesartan 20 MG tablet Commonly known as: BENICAR TAKE 1 TABLET(20 MG) BY MOUTH DAILY What changed: See the new instructions.   tamsulosin 0.4 MG Caps  capsule Commonly known as: FLOMAX Take 0.4 mg by mouth daily.   traMADol 50 MG tablet Commonly known as: ULTRAM Take one at night as needed.       No Known Allergies    CT ABDOMEN PELVIS WO CONTRAST  Result Date: 03/27/2020 CLINICAL DATA:  Suspected diverticulitis. EXAM: CT ABDOMEN AND PELVIS WITHOUT CONTRAST TECHNIQUE: Multidetector CT imaging of the abdomen and pelvis was performed following the standard protocol without IV contrast. COMPARISON:  Abdominal sonogram from 2018 FINDINGS: Lower chest: Lung bases are clear.  No effusion.  No consolidation. Hepatobiliary: Noncontrast appearance  of liver and gallbladder is normal. No gross biliary duct distension. Pancreas: Pancreas normal contour, no peripancreatic inflammation. No ductal dilation. Spleen: Spleen normal in size and contour. Adrenals/Urinary Tract: Adrenal glands are normal. Mild perinephric stranding bilaterally, nonspecific finding. Urinary bladder under distended with impression upon the bladder base from a markedly enlarged prostate. No hydronephrosis. No nephrolithiasis. No ureteral calculi. Stomach/Bowel: Stomach under distended. Small bowel without dilation or signs of inflammation. Normal appendix. Scattered colonic diverticulosis without signs of diverticulitis Vascular/Lymphatic: Atheromatous plaque of the abdominal aorta with calcification. No aneurysmal dilation. No adenopathy in the retroperitoneum. No adenopathy in the pelvis. Reproductive: Markedly enlarged prostate, prostate measuring approximately 7.4 x 6.0 x 6.4 cm. Mild periprosthetic stranding may be present. Findings are nonspecific on CT. Other: Mild stranding in the subcutaneous fat of the anterior abdominal wall in the midline with some skin thickening. No ascites. Small fat containing umbilical hernia. Musculoskeletal: Spinal degenerative changes. No acute or destructive bone finding. IMPRESSION: 1. Markedly enlarged prostate, prostate measuring approximately 7.4  x 6.0 x 6.4 cm. Mild periprosthetic stranding may be present. Findings are nonspecific on CT. Correlate with any clinical or laboratory evidence of prostatitis in the setting of lower abdominal pain. 2. Mild stranding in the subcutaneous fat of the anterior abdominal wall in the midline with some skin thickening. Findings could be related to injection sites. Correlate with any signs of cellulitis. 3. No signs of nephrolithiasis or hydronephrosis. 4. Normal appendix. 5. Diverticulosis without diverticulitis. 6. Aortic atherosclerosis. Aortic Atherosclerosis (ICD10-I70.0). Electronically Signed   By: Zetta Bills M.D.   On: 03/27/2020 11:25   CT Head Wo Contrast  Result Date: 03/27/2020 CLINICAL DATA:  Nonspecific dizziness for 2 weeks EXAM: CT HEAD WITHOUT CONTRAST TECHNIQUE: Contiguous axial images were obtained from the base of the skull through the vertex without intravenous contrast. COMPARISON:  Weeks 06/15/2018 FINDINGS: Brain: No evidence of acute infarction, hemorrhage, hydrocephalus, extra-axial collection or mass lesion/mass effect. Vascular: No hyperdense vessel or unexpected calcification. Skull: Normal. Negative for fracture or focal lesion. Sinuses/Orbits: No acute finding. IMPRESSION: Negative head CT. Electronically Signed   By: Monte Fantasia M.D.   On: 03/27/2020 11:21   DG Chest Portable 1 View  Result Date: 03/27/2020 CLINICAL DATA:  Dizziness EXAM: PORTABLE CHEST 1 VIEW COMPARISON:  08/04/2016 FINDINGS: The heart size and mediastinal contours are within normal limits. 9 mm nodular density projects over the periphery of the right lower lung field, and is favored to represent a nipple shadow. The lungs are otherwise clear. The visualized skeletal structures are unremarkable. IMPRESSION: 1. No active cardiopulmonary disease. 2. 9 mm nodular density projects over the periphery of the right lower lung field, and is favored to represent a nipple shadow. Repeat radiograph with nipple markers  could be performed to confirm. Electronically Signed   By: Davina Poke D.O.   On: 03/27/2020 11:09     The results of significant diagnostics from this hospitalization (including imaging, microbiology, ancillary and laboratory) are listed below for reference.     Microbiology: Recent Results (from the past 240 hour(s))  SARS Coronavirus 2 by RT PCR (hospital order, performed in [facility] hospital lab) Nasopharyngeal Nasopharyngeal Swab     Status: None   Collection Time: 03/27/20 11:32 AM   Specimen: Nasopharyngeal Swab  Result Value Ref Range Status   SARS Coronavirus 2 NEGATIVE NEGATIVE Final    Comment: (NOTE) SARS-CoV-2 target nucleic acids are NOT DETECTED.  The SARS-CoV-2 RNA is generally detectable in upper and lower respiratory  specimens during the acute phase of infection. The lowest concentration of SARS-CoV-2 viral copies this assay can detect is 250 copies / mL. A negative result does not preclude SARS-CoV-2 infection and should not be used as the sole basis for treatment or other patient management decisions.  A negative result may occur with improper specimen collection / handling, submission of specimen other than nasopharyngeal swab, presence of viral mutation(s) within the areas targeted by this assay, and inadequate number of viral copies (<250 copies / mL). A negative result must be combined with clinical observations, patient history, and epidemiological information.  Fact Sheet for Patients:   StrictlyIdeas.no  Fact Sheet for Healthcare Providers: BankingDealers.co.za  This test is not yet approved or  cleared by the [place] FDA and has been authorized for detection and/or diagnosis of SARS-CoV-2 by FDA under an Emergency Use Authorization (EUA).  This EUA will remain in effect (meaning this test can be used) for the duration of the COVID-19 declaration under Section 564(b)(1) of the Act, 21  U.S.C. section 360bbb-3(b)(1), unless the authorization is terminated or revoked sooner.  Performed at [facility], [address] [place], [place] [address]   Urine culture     Status: None   Collection Time: 03/28/20  3:32 AM   Specimen: Urine, Random  Result Value Ref Range Status   Specimen Description URINE, RANDOM  Final   Special Requests NONE  Final   Culture   Final    NO GROWTH Performed at [facility], [place] [address], [place], [place] [address]    Report Status 03/29/2020 FINAL  Final     Labs: BNP (last 3 results) No results for input(s): BNP in the last 8760 hours. Basic Metabolic Panel: Recent Labs  Lab 03/27/20 0945 03/27/20 0945 03/27/20 1320 03/27/20 1800 03/27/20 2315 03/28/20 0437 03/29/20 0817  NA 137  --  134* 134*  --  138 140  K 6.9*  --  5.1 4.9  --  4.4 3.8  CL 106  --  106 104  --  106 103  CO2 20*  --  17* 21*  --  20* 26  GLUCOSE 244*  --  287* 359*  --  296* 168*  BUN 72*  --  67* 59*  --  44* 26*  CREATININE 2.22*   < > 1.89* 1.61* 4.52* 1.31* 1.11  CALCIUM 9.7  --  9.1 8.5*  --  9.4 8.7*   < > = values in this interval not displayed.   Liver Function Tests: Recent Labs  Lab 03/27/20 0945 03/28/20 0437  AST 18 18  ALT 18 17  ALKPHOS 60 54  BILITOT 0.5 0.7  PROT 8.5* 7.0  ALBUMIN 4.8 3.9   Recent Labs  Lab 03/27/20 0945  LIPASE 42   No results for input(s): AMMONIA in the last 168 hours. CBC: Recent Labs  Lab 03/27/20 0945 03/27/20 2315 03/28/20 0437  WBC 7.6 4.6 7.3  NEUTROABS 5.9  --   --   HGB 12.0* 9.8* 10.6*  HCT 35.8* 28.8* 30.2*  MCV 88.0 86.5 86.0  PLT 183 143* 159   Cardiac Enzymes: Recent Labs  Lab 03/27/20 0945  CKTOTAL 59   BNP: Invalid input(s): POCBNP CBG: Recent Labs  Lab 03/28/20 1214 03/28/20 1735 03/28/20 2108 03/29/20 0608 03/29/20 0734  GLUCAP 199* 207* 154* 112* 113*   D-Dimer No results for input(s): DDIMER in the last 72 hours. Hgb A1c Recent Labs  03/27/20 2315  HGBA1C 8.4*   Lipid Profile No results for input(s): CHOL, HDL, LDLCALC, TRIG, CHOLHDL, LDLDIRECT in the last 72 hours. Thyroid function studies No results for input(s): TSH, T4TOTAL, T3FREE, THYROIDAB in the last 72 hours.  Invalid input(s): FREET3 Anemia work up No results for input(s): VITAMINB12, FOLATE, FERRITIN, TIBC, IRON, RETICCTPCT in the last 72 hours. Urinalysis    Component Value Date/Time   COLORURINE YELLOW 03/27/2020 1430   APPEARANCEUR CLEAR 03/27/2020 1430   LABSPEC 1.020 03/27/2020 1430   PHURINE 5.0 03/27/2020 1430   GLUCOSEU >=500 (A) 03/27/2020 1430   GLUCOSEU 250 (A) 12/05/2019 1117   HGBUR NEGATIVE 03/27/2020 1430   BILIRUBINUR NEGATIVE 03/27/2020 1430   KETONESUR NEGATIVE 03/27/2020 1430   PROTEINUR NEGATIVE 03/27/2020 1430   UROBILINOGEN 0.2 12/05/2019 1117   NITRITE NEGATIVE 03/27/2020 1430   LEUKOCYTESUR NEGATIVE 03/27/2020 1430   Sepsis Labs Invalid input(s): PROCALCITONIN,  WBC,  LACTICIDVEN Microbiology Recent Results (from the past 240 hour(s))  SARS Coronavirus 2 by RT PCR (hospital order, performed in [facility] hospital lab) Nasopharyngeal Nasopharyngeal Swab     Status: None   Collection Time: 03/27/20 11:32 AM   Specimen: Nasopharyngeal Swab  Result Value Ref Range Status   SARS Coronavirus 2 NEGATIVE NEGATIVE Final    Comment: (NOTE) SARS-CoV-2 target nucleic acids are NOT DETECTED.  The SARS-CoV-2 RNA is generally detectable in upper and lower respiratory specimens during the acute phase of infection. The lowest concentration of SARS-CoV-2 viral copies this assay can detect is 250 copies / mL. A negative result does not preclude SARS-CoV-2 infection and should not be used as the sole basis for treatment or other patient management decisions.  A negative result may occur with improper specimen collection / handling, submission of specimen other than nasopharyngeal swab, presence of viral mutation(s) within the areas  targeted by this assay, and inadequate number of viral copies (<250 copies / mL). A negative result must be combined with clinical observations, patient history, and epidemiological information.  Fact Sheet for Patients:   StrictlyIdeas.no  Fact Sheet for Healthcare Providers: BankingDealers.co.za  This test is not yet approved or  cleared by the [place] FDA and has been authorized for detection and/or diagnosis of SARS-CoV-2 by FDA under an Emergency Use Authorization (EUA).  This EUA will remain in effect (meaning this test can be used) for the duration of the COVID-19 declaration under Section 564(b)(1) of the Act, 21 U.S.C. section 360bbb-3(b)(1), unless the authorization is terminated or revoked sooner.  Performed at [facility], [address] [place], [place] [address]   Urine culture     Status: None   Collection Time: 03/28/20  3:32 AM   Specimen: Urine, Random  Result Value Ref Range Status   Specimen Description URINE, RANDOM  Final   Special Requests NONE  Final   Culture   Final    NO GROWTH Performed at [facility], [place] [address], [place], [place] [address]    Report Status 03/29/2020 FINAL  Final     Time coordinating discharge in minutes: 65  SIGNED:   Debbe Odea, MD  [facility] 03/29/2020, 10:14 AM

## 2020-03-29 NOTE — Discharge Instructions (Signed)
Please see your urologist ASAP. Let the office know that you have had an admission to the hospital and have a foley catheter.  Your Toprol has been held because your heart rate is a little too slow. Please see your primary care physician in a week to have your heart rate checked.   You were cared for by a hospitalist during your hospital stay. If you have any questions about your discharge medications or the care you received while you were in the hospital after you are discharged, you can call the unit and asked to speak with the hospitalist on call if the hospitalist that took care of you is not available. Once you are discharged, your primary care physician will handle any further medical issues.   Please note that NO REFILLS for any discharge medications will be authorized once you are discharged, as it is imperative that you return to your primary care physician (or establish a relationship with a primary care physician if you do not have one) for your aftercare needs so that they can reassess your need for medications and monitor your lab values.  Please take all your medications with you for your next visit with your Primary MD. Please ask your Primary MD to get all Hospital records sent to his/her office. Please request your Primary MD to go over all hospital test results at the follow up.   If you experience worsening of your admission symptoms, develop shortness of breath, chest pain, suicidal or homicidal thoughts or a life threatening emergency, you must seek medical attention immediately by calling 911 or calling your MD.   Dennis Bast must read the complete instructions/literature along with all the possible adverse reactions/side effects for all the medicines you take including new medications that have been prescribed to you. Take new medicines after you have completely understood and accpet all the possible adverse reactions/side effects.    Do not drive when taking pain medications or  sedatives.     Do not take more than prescribed Pain, Sleep and Anxiety Medications   If you have smoked or chewed Tobacco in the last 2 yrs please stop. Stop any regular alcohol  and or recreational drug use.   Wear Seat belts while driving.

## 2020-04-01 ENCOUNTER — Telehealth: Payer: Self-pay

## 2020-04-01 DIAGNOSIS — R338 Other retention of urine: Secondary | ICD-10-CM | POA: Diagnosis not present

## 2020-04-01 NOTE — Telephone Encounter (Signed)
Transition Care Management Follow-up Telephone Call  Date of discharge and from where: 03/29/20-[facility]  How have you been since you were released from the hospital? Good  Any questions or concerns? No  Items Reviewed:  Did the pt receive and understand the discharge instructions provided? Yes   Medications obtained and verified? Yes   Any new allergies since your discharge? No   Dietary orders reviewed? Yes  Do you have support at home? Yes   Functional Questionnaire: (I = Independent and D = Dependent) ADLs: I-with some assistance  Bathing/Dressing- I-with assistance  Meal Prep- D  Eating- I  Maintaining continence- I-Has Foley catheter in place  Transferring/Ambulation- I-with a cane  Managing Meds- I  Follow up appointments reviewed:   PCP Hospital f/u appt confirmed? Yes  Scheduled to see Dr. Ethelene Hal on 04/04/20 @ 11:30am.  [facility] f/u appt confirmed? Yes  Saw urologist today  Are transportation arrangements needed? No   If their condition worsens, is the pt aware to call PCP or go to the Emergency Dept.? Yes  Was the patient provided with contact information for the PCP's office or ED? Yes  Was to pt encouraged to call back with questions or concerns? Yes

## 2020-04-02 ENCOUNTER — Encounter: Payer: Self-pay | Admitting: Family Medicine

## 2020-04-03 ENCOUNTER — Other Ambulatory Visit: Payer: Self-pay

## 2020-04-03 ENCOUNTER — Ambulatory Visit: Payer: Medicare Other | Admitting: Surgical

## 2020-04-04 ENCOUNTER — Encounter: Payer: Self-pay | Admitting: Family Medicine

## 2020-04-04 ENCOUNTER — Ambulatory Visit (INDEPENDENT_AMBULATORY_CARE_PROVIDER_SITE_OTHER): Payer: Medicare Other | Admitting: Family Medicine

## 2020-04-04 VITALS — BP 136/62 | HR 68 | Temp 98.6°F | Ht 71.0 in | Wt 198.2 lb

## 2020-04-04 DIAGNOSIS — Z09 Encounter for follow-up examination after completed treatment for conditions other than malignant neoplasm: Secondary | ICD-10-CM | POA: Diagnosis not present

## 2020-04-04 DIAGNOSIS — N481 Balanitis: Secondary | ICD-10-CM | POA: Diagnosis not present

## 2020-04-04 MED ORDER — KETOCONAZOLE 2 % EX CREA: TOPICAL_CREAM | CUTANEOUS | 0 refills | Status: DC

## 2020-04-04 NOTE — Progress Notes (Signed)
Established Patient Office Visit  Subjective:  Patient ID: Scott Donaldson, male    DOB: 1950/10/05  Age: 69 y.o. MRN: [id number]  CC:  Chief Complaint  Patient presents with  . Follow-up    hospital follow up concerns about BP since hospital have taken pateint off one medication.     HPI [name] presents for hospital discharge follow-up for hypotension secondary to hypovolemia bulimia with acute urinary retention secondary to BPH.  Patient was given IV fluids and indwelling catheter was placed.  Feeling much better.  Olmesartan was ordered held.  Blood pressure is still normal today.  Patient is feeling much improved, eating and drinking normally.  He has follow-up scheduled with urology on Monday.  There is some irritation at the meatus.  Past Medical History:  Diagnosis Date  . Abdominal pain 08/04/2016  . Abnormal PSA 05/19/2015  . Arthralgia 11/12/2010  . Benign prostatic hyperplasia with urinary hesitancy 06/14/2018  . Cataract   . CELLULITIS, FOOT, RIGHT 07/02/2008   Qualifier: Diagnosis of  By: Marca Ancona RMA, Lucy    . Cough 08/04/2016  . Diabetes ([place]) 03/04/2007   Qualifier: Diagnosis of  By: Loanne Drilling MD, Jacelyn Pi   . DIABETES MELLITUS, TYPE II 03/04/2007  . Edema 07/02/2008   Qualifier: Diagnosis of  By: Marca Ancona RMA, Lucy    . Elevated LDL cholesterol level 06/14/2018  . Foot ulcer, right ([place]) 04/15/2014  . GERD (gastroesophageal reflux disease)   . HYPERCHOLESTEROLEMIA 07/29/2008  . HYPERTENSION 03/04/2007  . Knee pain, left 05/16/2015  . Loss of vision 04/15/2014  . Nonintractable episodic headache 12/01/2016  . Psoriasis   . SHOULDER PAIN, BILATERAL 01/12/2010   Qualifier: Diagnosis of  By: Loanne Drilling MD, Jacelyn Pi   . Swelling of left foot 08/05/2017  . 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 COLONOSCOPY    . LEFT HEART CATH AND CORONARY ANGIOGRAPHY N/A 07/12/2018    Procedure: LEFT HEART CATH AND CORONARY ANGIOGRAPHY;  Surgeon: Wellington Hampshire, MD;  Location: [facility];  Service: Cardiovascular;  Laterality: N/A;  . stress cardiolite  05/30/2003    Family History  Problem Relation Age of Onset  . Cancer Mother        Breast Cancer  . Cancer Father        uncertain type  . Diabetes Father   . Cancer Brother 62       Colon Cancer  . Colon cancer Neg Hx   . Rectal cancer Neg Hx   . Stomach cancer Neg Hx     Social History   Socioeconomic History  . Marital status: Single    Spouse name: Not on file  . Number of children: 2  . Years of education: 10  . Highest education level: Not on file  Occupational History  . Occupation: roofer  Tobacco Use  . Smoking status: Former Smoker    Quit date: 07/26/1988    Years since quitting: 31.7  . Smokeless tobacco: Never Used  Vaping Use  . Vaping Use: Never used  Substance and Sexual Activity  . Alcohol use: No  . Drug use: No  . Sexual activity: Not on file  Other Topics Concern  . Not on file  Social History Narrative   Lives with daughter in a one story home.  Has 2 children.  Semi-retired.  Works as a  roofer.  Education: 10th grade.    Social Determinants of Health   Financial Resource Strain:   . Difficulty of Paying Living Expenses: Not on file  Food Insecurity:   . Worried About Charity fundraiser in the Last Year: Not on file  . Ran Out of Food in the Last Year: Not on file  Transportation Needs:   . Lack of Transportation (Medical): Not on file  . Lack of Transportation (Non-Medical): Not on file  Physical Activity:   . Days of Exercise per Week: Not on file  . Minutes of Exercise per Session: Not on file  Stress:   . Feeling of Stress : Not on file  Social Connections:   . Frequency of Communication with Friends and Family: Not on file  . Frequency of Social Gatherings with Friends and Family: Not on file  . Attends Religious Services: Not on file  . Active  Member of Clubs or Organizations: Not on file  . Attends Archivist Meetings: Not on file  . Marital Status: Not on file  Intimate Partner Violence:   . Fear of Current or Ex-Partner: Not on file  . Emotionally Abused: Not on file  . Physically Abused: Not on file  . Sexually Abused: Not on file    Outpatient Medications Prior to Visit  Medication Sig Dispense Refill  . aspirin EC 81 MG tablet Take 1 tablet (81 mg total) by mouth daily.    [name] atorvastatin (LIPITOR) 40 MG tablet Take 1 tablet (40 mg total) by mouth daily. 90 tablet 3  . diclofenac Sodium (VOLTAREN) 1 % GEL Apply a small grape sized dollop to tender space of right knee up to 4 times daily. (Patient taking differently: Apply 2 g topically 4 (four) times daily as needed (right knee pain). Apply a small grape sized dollop to tender space of right knee up to 4 times daily.) 150 g 1  . gabapentin (NEURONTIN) 300 MG capsule Take one at night for one week and then increase to one twice daily. (Patient taking differently: Take 300 mg by mouth at bedtime. ) 60 capsule 3  . glucose blood test strip USE TO CHECK BLOOD SUGAR TWICE DAILY 100 each 12  . insulin NPH-regular Human (70-30) 100 UNIT/ML injection Inject 36 Units into the skin 2 (two) times daily with a meal. 10 mL 11  . metFORMIN (GLUCOPHAGE-XR) 500 MG 24 hr tablet Take 2 tablets (1,000 mg total) by mouth 2 (two) times daily. 360 tablet 3  . olmesartan (BENICAR) 20 MG tablet TAKE 1 TABLET(20 MG) BY MOUTH DAILY (Patient taking differently: Take 20 mg by mouth daily. TAKE 1 TABLET(20 MG) BY MOUTH DAILY) 90 tablet 0  . tamsulosin (FLOMAX) 0.4 MG CAPS capsule Take 0.4 mg by mouth daily.     . colchicine 0.6 MG tablet Take 1 tablet (0.6 mg total) by mouth 2 (two) times daily. (Patient not taking: Reported on 04/04/2020) 60 tablet 2  . dapagliflozin propanediol (FARXIGA) 5 MG TABS tablet Take 5 mg by mouth daily before breakfast. (Patient not taking: Reported on 03/28/2020) 30  tablet 4  . traMADol (ULTRAM) 50 MG tablet Take one at night as needed. (Patient not taking: Reported on 03/28/2020) 15 tablet 0   No facility-administered medications prior to visit.    No Known Allergies  ROS Review of Systems  Constitutional: Negative.   Respiratory: Negative.   Cardiovascular: Negative.   Gastrointestinal: Negative.   Neurological: Negative for light-headedness and headaches.  Psychiatric/Behavioral: Negative.       Objective:    Physical Exam Vitals and nursing note reviewed.  Constitutional:      Appearance: Normal appearance. He is normal weight.  HENT:     Head: Normocephalic and atraumatic.     Right Ear: External ear normal.     Left Ear: External ear normal.  Eyes:     General: No scleral icterus.       Right eye: No discharge.        Left eye: No discharge.     Extraocular Movements: Extraocular movements intact.     Conjunctiva/sclera: Conjunctivae normal.  Cardiovascular:     Rate and Rhythm: Normal rate and regular rhythm.  Pulmonary:     Effort: Pulmonary effort is normal.     Breath sounds: Normal breath sounds.  Genitourinary:    Penis: Uncircumcised. Erythema (at meatus ) present. No phimosis, paraphimosis, hypospadias, tenderness, discharge, swelling or lesions.     Neurological:     Mental Status: He is alert and oriented to person, place, and time.  Psychiatric:        Mood and Affect: Mood normal.        Behavior: Behavior normal.     BP 136/62   Pulse 68   Temp 98.6 F (37 C) (Tympanic)   Ht 5\' 11"  (1.803 m)   Wt 198 lb 3.2 oz (89.9 kg)   SpO2 96%   BMI 27.64 kg/m  Wt Readings from Last 3 Encounters:  04/04/20 198 lb 3.2 oz (89.9 kg)  03/29/20 189 lb 14.4 oz (86.1 kg)  03/27/20 186 lb 6.4 oz (84.6 kg)     Health Maintenance Due  Topic Date Due  . OPHTHALMOLOGY EXAM  07/18/2019  . FOOT EXAM  09/08/2019  . INFLUENZA VACCINE  02/24/2020    There are no preventive care reminders to display for this  patient.  Lab Results  Component Value Date   TSH 1.18 08/04/2016   Lab Results  Component Value Date   WBC 6.7 03/29/2020   HGB 9.9 (L) 03/29/2020   HCT 30.0 (L) 03/29/2020   MCV 88.2 03/29/2020   PLT 129 (L) 03/29/2020   Lab Results  Component Value Date   NA 140 03/29/2020   K 3.8 03/29/2020   CO2 26 03/29/2020   GLUCOSE 168 (H) 03/29/2020   BUN 26 (H) 03/29/2020   CREATININE 1.11 03/29/2020   BILITOT 0.7 03/28/2020   ALKPHOS 54 03/28/2020   AST 18 03/28/2020   ALT 17 03/28/2020   PROT 7.0 03/28/2020   ALBUMIN 3.9 03/28/2020   CALCIUM 8.7 (L) 03/29/2020   ANIONGAP 11 03/29/2020   GFR 80.43 12/05/2019   Lab Results  Component Value Date   CHOL 198 07/23/2019   Lab Results  Component Value Date   HDL 42.60 07/23/2019   Lab Results  Component Value Date   LDLCALC 117 (H) 07/23/2019   Lab Results  Component Value Date   TRIG 191.0 (H) 07/23/2019   Lab Results  Component Value Date   CHOLHDL 5 07/23/2019   Lab Results  Component Value Date   HGBA1C 8.4 (H) 03/27/2020      Assessment & Plan:   Problem List Items Addressed This Visit      Genitourinary   Balanitis   Relevant Medications   ketoconazole (NIZORAL) 2 % cream     Other   Hospital discharge follow-up - Primary      Meds ordered this encounter  Medications  .  ketoconazole (NIZORAL) 2 % cream    Sig: Apply to glans once daily    Dispense:  15 g    Refill:  0    Follow-up: Return in about 2 weeks (around 04/18/2020).    [name], MD

## 2020-04-06 ENCOUNTER — Emergency Department (HOSPITAL_COMMUNITY)
Admission: EM | Admit: 2020-04-06 | Discharge: 2020-04-06 | Disposition: A | Discharge status: Home / Self Care | Payer: Medicare Other | Attending: Emergency Medicine | Admitting: Emergency Medicine

## 2020-04-06 ENCOUNTER — Other Ambulatory Visit: Payer: Self-pay

## 2020-04-06 ENCOUNTER — Encounter (HOSPITAL_COMMUNITY): Payer: Self-pay | Admitting: Emergency Medicine

## 2020-04-06 DIAGNOSIS — Z7982 Long term (current) use of aspirin: Secondary | ICD-10-CM | POA: Insufficient documentation

## 2020-04-06 DIAGNOSIS — R339 Retention of urine, unspecified: Secondary | ICD-10-CM | POA: Insufficient documentation

## 2020-04-06 DIAGNOSIS — R338 Other retention of urine: Secondary | ICD-10-CM

## 2020-04-06 DIAGNOSIS — Z794 Long term (current) use of insulin: Secondary | ICD-10-CM | POA: Diagnosis not present

## 2020-04-06 DIAGNOSIS — I2510 Atherosclerotic heart disease of native coronary artery without angina pectoris: Secondary | ICD-10-CM | POA: Diagnosis not present

## 2020-04-06 DIAGNOSIS — Z79899 Other long term (current) drug therapy: Secondary | ICD-10-CM | POA: Diagnosis not present

## 2020-04-06 DIAGNOSIS — Z87891 Personal history of nicotine dependence: Secondary | ICD-10-CM | POA: Diagnosis not present

## 2020-04-06 DIAGNOSIS — R0902 Hypoxemia: Secondary | ICD-10-CM | POA: Diagnosis not present

## 2020-04-06 DIAGNOSIS — I10 Essential (primary) hypertension: Secondary | ICD-10-CM | POA: Diagnosis not present

## 2020-04-06 DIAGNOSIS — E1165 Type 2 diabetes mellitus with hyperglycemia: Secondary | ICD-10-CM | POA: Diagnosis not present

## 2020-04-06 DIAGNOSIS — R112 Nausea with vomiting, unspecified: Secondary | ICD-10-CM | POA: Insufficient documentation

## 2020-04-06 DIAGNOSIS — E1140 Type 2 diabetes mellitus with diabetic neuropathy, unspecified: Secondary | ICD-10-CM | POA: Diagnosis not present

## 2020-04-06 DIAGNOSIS — R110 Nausea: Secondary | ICD-10-CM | POA: Diagnosis not present

## 2020-04-06 DIAGNOSIS — R6889 Other general symptoms and signs: Secondary | ICD-10-CM | POA: Diagnosis not present

## 2020-04-06 DIAGNOSIS — R109 Unspecified abdominal pain: Secondary | ICD-10-CM | POA: Diagnosis not present

## 2020-04-06 DIAGNOSIS — Z960 Presence of urogenital implants: Secondary | ICD-10-CM | POA: Diagnosis not present

## 2020-04-06 DIAGNOSIS — Z743 Need for continuous supervision: Secondary | ICD-10-CM | POA: Diagnosis not present

## 2020-04-06 LAB — CBC WITH DIFFERENTIAL/PLATELET
Abs Immature Granulocytes: 0.06 10*3/uL (ref 0.00–0.07)
Basophils Absolute: 0 10*3/uL (ref 0.0–0.1)
Basophils Relative: 0 %
Eosinophils Absolute: 0.2 10*3/uL (ref 0.0–0.5)
Eosinophils Relative: 2 %
HCT: 29.9 % — ABNORMAL LOW (ref 39.0–52.0)
Hemoglobin: 10.2 g/dL — ABNORMAL LOW (ref 13.0–17.0)
Immature Granulocytes: 1 %
Lymphocytes Relative: 19 %
Lymphs Abs: 1.4 10*3/uL (ref 0.7–4.0)
MCH: 30.5 pg (ref 26.0–34.0)
MCHC: 34.1 g/dL (ref 30.0–36.0)
MCV: 89.5 fL (ref 80.0–100.0)
Monocytes Absolute: 0.4 10*3/uL (ref 0.1–1.0)
Monocytes Relative: 5 %
Neutro Abs: 5.5 10*3/uL (ref 1.7–7.7)
Neutrophils Relative %: 73 %
Platelets: 169 10*3/uL (ref 150–400)
RBC: 3.34 MIL/uL — ABNORMAL LOW (ref 4.22–5.81)
RDW: 12.8 % (ref 11.5–15.5)
WBC: 7.5 10*3/uL (ref 4.0–10.5)
nRBC: 0 % (ref 0.0–0.2)

## 2020-04-06 LAB — URINALYSIS, ROUTINE W REFLEX MICROSCOPIC
Bilirubin Urine: NEGATIVE
Glucose, UA: 50 mg/dL — AB
Ketones, ur: NEGATIVE mg/dL
Leukocytes,Ua: NEGATIVE
Nitrite: NEGATIVE
Protein, ur: NEGATIVE mg/dL
Specific Gravity, Urine: 1.012 (ref 1.005–1.030)
pH: 6 (ref 5.0–8.0)

## 2020-04-06 LAB — BASIC METABOLIC PANEL
Anion gap: 13 (ref 5–15)
BUN: 16 mg/dL (ref 8–23)
CO2: 24 mmol/L (ref 22–32)
Calcium: 8.5 mg/dL — ABNORMAL LOW (ref 8.9–10.3)
Chloride: 104 mmol/L (ref 98–111)
Creatinine, Ser: 0.95 mg/dL (ref 0.61–1.24)
GFR calc Af Amer: >60 mL/min (ref >60)
GFR calc non Af Amer: >60 mL/min (ref >60)
Glucose, Bld: 217 mg/dL — ABNORMAL HIGH (ref 70–99)
Potassium: 3.6 mmol/L (ref 3.5–5.1)
Sodium: 141 mmol/L (ref 135–145)

## 2020-04-06 LAB — LIPASE, BLOOD: Lipase: 33 U/L (ref 11–51)

## 2020-04-06 NOTE — ED Provider Notes (Signed)
[facility] DEPT Provider Note   CSN: [id number] Arrival date & time: 04/06/20  2135     History Chief Complaint  Patient presents with  . Abdominal Pain    Scott Donaldson is a 69 y.o. male.  The history is provided by the patient, the EMS personnel and medical records. No language interpreter was used.  Abdominal Pain  Scott Donaldson is a 69 y.o. male who presents to the Emergency Department complaining of urinary retention. He presents the emergency department by EMS for evaluation of urinary retention. He was recently admitted to the hospital and discharged home with a Foley catheter due to urinary retention and BPH. He was feeling well following hospital discharge. He last emptied his catheter bag at 1 o'clock today. Around 6 o'clock he developed abdominal pain and discomfort and he noticed that he was not having any more drainage of urine. On EMS arrival they reported 5 mL of urine in his catheter bag. In route to the hospital he was treated with 200 g of fentanyl and during hospital transport his Foley began to drain urine again. He did have nausea and vomiting, now since resolved. Denies any fevers. He is scheduled to follow-up with urology tomorrow. He has not been flushing the catheter. He is not on antibiotics.    Past Medical History:  Diagnosis Date  . Abdominal pain 08/04/2016  . Abnormal PSA 05/19/2015  . Arthralgia 11/12/2010  . Benign prostatic hyperplasia with urinary hesitancy 06/14/2018  . Cataract   . CELLULITIS, FOOT, RIGHT 07/02/2008   Qualifier: Diagnosis of  By: Marca Ancona RMA, Lucy    . Cough 08/04/2016  . Diabetes ([place]) 03/04/2007   Qualifier: Diagnosis of  By: Loanne Drilling MD, Jacelyn Pi   . DIABETES MELLITUS, TYPE II 03/04/2007  . Edema 07/02/2008   Qualifier: Diagnosis of  By: Marca Ancona RMA, Lucy    . Elevated LDL cholesterol level 06/14/2018  . Foot ulcer, right ([place]) 04/15/2014  . GERD (gastroesophageal reflux disease)   .  HYPERCHOLESTEROLEMIA 07/29/2008  . HYPERTENSION 03/04/2007  . Knee pain, left 05/16/2015  . Loss of vision 04/15/2014  . Nonintractable episodic headache 12/01/2016  . Psoriasis   . SHOULDER PAIN, BILATERAL 01/12/2010   Qualifier: Diagnosis of  By: Loanne Drilling MD, Jacelyn Pi   . Swelling of left foot 08/05/2017  . 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 Balanitis 04/04/2020  . Hospital discharge follow-up 04/04/2020  . Prostatitis 03/28/2020  . Hypotension due to hypovolemia 03/27/2020  . Hyperkalemia 03/27/2020  . Acute kidney injury ([place]) 03/27/2020  . Right knee pain 03/07/2020  . Acute idiopathic gout of right knee 02/25/2020  . Bilateral carotid bruits 12/05/2019  . Type 2 diabetes mellitus with diabetic polyneuropathy, with long-term current use of insulin ([place]) 09/05/2019  . Diabetes mellitus ([place]) 09/05/2019  . Cervical radiculopathy 02/12/2019  . Constipation 02/12/2019  . DOE (dyspnea on exertion) 02/12/2019  . Chest pain 02/12/2019  . No-show for appointment 11/21/2018  . Coronary artery disease involving native heart 07/20/2018  . Bronchitis 07/20/2018  . Abnormal stress test   . Benign prostatic hyperplasia with urinary hesitancy 06/14/2018  . Elevated LDL cholesterol level 06/14/2018  . Swelling of left foot 08/05/2017  . Nonintractable episodic headache 12/01/2016  . Cough 08/04/2016  . Lower abdominal pain 08/04/2016  . Abnormal PSA 05/19/2015  . OA (osteoarthritis)  of knee 05/16/2015  . Foot ulcer, right ([place]) 04/15/2014  . Loss of vision 04/15/2014  . Screening for prostate cancer 11/03/2012  . Routine general medical examination at a health care facility 11/03/2012  . Arthralgia 11/12/2010  . SHOULDER PAIN, BILATERAL 01/12/2010  . 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 HYPERCHOLESTEROLEMIA 07/29/2008  . CELLULITIS, FOOT, RIGHT 07/02/2008  . EDEMA  07/02/2008  . Type 2 diabetes mellitus with hyperglycemia, with long-term current use of insulin ([place]) 03/04/2007  . Essential hypertension 03/04/2007    Past Surgical History:  Procedure Laterality Date  . COLONOSCOPY    . LEFT HEART CATH AND CORONARY ANGIOGRAPHY N/A 07/12/2018   Procedure: LEFT HEART CATH AND CORONARY ANGIOGRAPHY;  Surgeon: Wellington Hampshire, MD;  Location: [facility];  Service: Cardiovascular;  Laterality: N/A;  . stress cardiolite  05/30/2003       Family History  Problem Relation Age of Onset  . Cancer Mother        Breast Cancer  . Cancer Father        uncertain type  . Diabetes Father   . Cancer Brother 5       Colon Cancer  . Colon cancer Neg Hx   . Rectal cancer Neg Hx   . Stomach cancer Neg Hx     Social History   Tobacco Use  . Smoking status: Former Smoker    Quit date: 07/26/1988    Years since quitting: 31.7  . Smokeless tobacco: Never Used  Vaping Use  . Vaping Use: Never used  Substance Use Topics  . Alcohol use: No  . Drug use: No    Home Medications Prior to Admission medications   Medication Sig Start Date End Date Taking? Authorizing Provider  aspirin EC 81 MG tablet Take 1 tablet (81 mg total) by mouth daily. 06/27/18   Josue Hector, MD  atorvastatin (LIPITOR) 40 MG tablet Take 1 tablet (40 mg total) by mouth daily. 08/29/19   [name], MD  colchicine 0.6 MG tablet Take 1 tablet (0.6 mg total) by mouth 2 (two) times daily. Patient not taking: Reported on 04/04/2020 02/25/20   Rosemarie Ax, MD  dapagliflozin propanediol (FARXIGA) 5 MG TABS tablet Take 5 mg by mouth daily before breakfast. Patient not taking: Reported on 03/28/2020 12/06/19   Shamleffer, Melanie Crazier, MD  diclofenac Sodium (VOLTAREN) 1 % GEL Apply a small grape sized dollop to tender space of right knee up to 4 times daily. Patient taking differently: Apply 2 g topically 4 (four) times daily as needed (right knee pain). Apply a small grape  sized dollop to tender space of right knee up to 4 times daily. 03/07/20   [name], MD  gabapentin (NEURONTIN) 300 MG capsule Take one at night for one week and then increase to one twice daily. Patient taking differently: Take 300 mg by mouth at bedtime.  12/05/19   [name], MD  glucose blood test strip USE TO CHECK BLOOD SUGAR TWICE DAILY 10/02/18   Renato Shin, MD  insulin NPH-regular Human (70-30) 100 UNIT/ML injection Inject 36 Units into the skin 2 (two) times daily with a meal. 10/11/19   Shamleffer, Melanie Crazier, MD  ketoconazole (NIZORAL) 2 % cream Apply to glans once daily 04/04/20   [name], MD  metFORMIN (GLUCOPHAGE-XR) 500 MG 24 hr tablet Take 2 tablets (1,000 mg total) by mouth 2 (two) times daily. 12/06/19   Shamleffer, Melanie Crazier, MD  olmesartan (  BENICAR) 20 MG tablet TAKE 1 TABLET(20 MG) BY MOUTH DAILY Patient taking differently: Take 20 mg by mouth daily. TAKE 1 TABLET(20 MG) BY MOUTH DAILY 01/08/20   [name], MD  tamsulosin (FLOMAX) 0.4 MG CAPS capsule Take 0.4 mg by mouth daily.  11/19/19   [provider]  traMADol (ULTRAM) 50 MG tablet Take one at night as needed. Patient not taking: Reported on 03/28/2020 03/07/20   [name], MD    Allergies    Patient has no known allergies.  Review of Systems   Review of Systems  Gastrointestinal: Positive for abdominal pain.  All other systems reviewed and are negative.   Physical Exam Updated Vital Signs BP 139/77   Pulse 66   Temp 98.7 F (37.1 C) (Oral)   Resp 14   Ht 5\' 11"  (1.803 m)   Wt 89 kg   SpO2 93%   BMI 27.37 kg/m   Physical Exam Vitals and nursing note reviewed.  Constitutional:      Appearance: He is well-developed.  HENT:     Head: Normocephalic and atraumatic.  Cardiovascular:     Rate and Rhythm: Normal rate and regular rhythm.     Heart sounds: Murmur heard.   Pulmonary:     Effort: Pulmonary effort is normal. No  respiratory distress.     Breath sounds: Normal breath sounds.  Abdominal:     Palpations: Abdomen is soft.     Tenderness: There is no abdominal tenderness. There is no guarding or rebound.  Genitourinary:    Comments: Foley catheter in place with faint yellow urine in the catheter bag. There is approximately 1000 mL in the bag. Musculoskeletal:        General: No tenderness.  Skin:    General: Skin is warm and dry.  Neurological:     Mental Status: He is alert and oriented to person, place, and time.  Psychiatric:        Behavior: Behavior normal.     ED Results / Procedures / Treatments   Labs (all labs ordered are listed, but only abnormal results are displayed) Labs Reviewed  URINALYSIS, ROUTINE W REFLEX MICROSCOPIC - Abnormal; Notable for the following components:      Result Value   Glucose, UA 50 (*)    Hgb urine dipstick MODERATE (*)    Bacteria, UA RARE (*)    All other components within normal limits  BASIC METABOLIC PANEL - Abnormal; Notable for the following components:   Glucose, Bld 217 (*)    Calcium 8.5 (*)    All other components within normal limits  CBC WITH DIFFERENTIAL/PLATELET - Abnormal; Notable for the following components:   RBC 3.34 (*)    Hemoglobin 10.2 (*)    HCT 29.9 (*)    All other components within normal limits  URINE CULTURE  LIPASE, BLOOD    EKG None  Radiology No results found.  Procedures Procedures (including critical care time)  Medications Ordered in ED Medications - No data to display  ED Course  I have reviewed the triage vital signs and the nursing notes.  Pertinent labs & imaging results that were available during my care of the patient were reviewed by me and considered in my medical decision making (see chart for details).    MDM Rules/Calculators/A&P                         Patient with recent admission for urinary  retention with acute kidney injury here for evaluation of recurrent urinary retention that  began this afternoon. Foley catheter obstruction was dislodged in route to the emergency department in the ambulance. He had returned of 1000 ML's of urine. His symptoms were relieved on examination. Labs with stable renal function. UA not consistent with UTI. He has follow-up appointment scheduled with urology tomorrow. Plan to discharge home with urology follow-up and return precautions. Urine culture sent for surveillance, current presentation is not consistent with UTI.  Final Clinical Impression(s) / ED Diagnoses Final diagnoses:  Acute urinary retention    Rx / DC Orders ED Discharge Orders    None       Quintella Reichert, MD 04/06/20 2312

## 2020-04-06 NOTE — ED Notes (Signed)
Bladder scanned pt and 0 mL was the result.

## 2020-04-06 NOTE — ED Triage Notes (Signed)
Pt arrived via EMS. Pt has abdominal pain. Pt has foley in place since 04/03/20. Pt has lower abdominal pain. Pt was given fentanyl 270mcg and 4mg  of zofran. Pt states that the nausea started after fentanyl was given.

## 2020-04-07 DIAGNOSIS — R338 Other retention of urine: Secondary | ICD-10-CM | POA: Diagnosis not present

## 2020-04-07 LAB — URINE CULTURE: Culture: NO GROWTH

## 2020-04-16 ENCOUNTER — Other Ambulatory Visit: Payer: Self-pay

## 2020-04-17 ENCOUNTER — Encounter: Payer: Self-pay | Admitting: Family Medicine

## 2020-04-17 ENCOUNTER — Ambulatory Visit (INDEPENDENT_AMBULATORY_CARE_PROVIDER_SITE_OTHER): Payer: Medicare Other | Admitting: Family Medicine

## 2020-04-17 ENCOUNTER — Ambulatory Visit: Payer: Medicare Other | Admitting: Orthopedic Surgery

## 2020-04-17 VITALS — BP 154/78 | HR 57 | Temp 98.9°F | Ht 71.0 in | Wt 201.6 lb

## 2020-04-17 DIAGNOSIS — E538 Deficiency of other specified B group vitamins: Secondary | ICD-10-CM | POA: Diagnosis not present

## 2020-04-17 DIAGNOSIS — I10 Essential (primary) hypertension: Secondary | ICD-10-CM

## 2020-04-17 DIAGNOSIS — E611 Iron deficiency: Secondary | ICD-10-CM | POA: Diagnosis not present

## 2020-04-17 DIAGNOSIS — D649 Anemia, unspecified: Secondary | ICD-10-CM

## 2020-04-17 DIAGNOSIS — R339 Retention of urine, unspecified: Secondary | ICD-10-CM

## 2020-04-17 LAB — B12 AND FOLATE PANEL
Folate: 7.8 ng/mL (ref >5.9)
Vitamin B-12: 294 pg/mL (ref 211–911)

## 2020-04-17 LAB — CBC
HCT: 32.9 % — ABNORMAL LOW (ref 39.0–52.0)
Hemoglobin: 11.2 g/dL — ABNORMAL LOW (ref 13.0–17.0)
MCHC: 34.1 g/dL (ref 30.0–36.0)
MCV: 88 fl (ref 78.0–100.0)
Platelets: 204 10*3/uL (ref 150.0–400.0)
RBC: 3.74 Mil/uL — ABNORMAL LOW (ref 4.22–5.81)
RDW: 14.1 % (ref 11.5–15.5)
WBC: 7.3 10*3/uL (ref 4.0–10.5)

## 2020-04-17 LAB — BASIC METABOLIC PANEL
BUN: 14 mg/dL (ref 6–23)
CO2: 27 mEq/L (ref 19–32)
Calcium: 9.1 mg/dL (ref 8.4–10.5)
Chloride: 104 mEq/L (ref 96–112)
Creatinine, Ser: 0.8 mg/dL (ref 0.40–1.50)
GFR: 95.59 mL/min (ref >60.00)
Glucose, Bld: 153 mg/dL — ABNORMAL HIGH (ref 70–99)
Potassium: 4.2 mEq/L (ref 3.5–5.1)
Sodium: 140 mEq/L (ref 135–145)

## 2020-04-17 NOTE — Progress Notes (Addendum)
Established Patient Office Visit  Subjective:  Patient ID: Scott Donaldson, male    DOB: 08/10/1950  Age: 69 y.o. MRN: [id number]  CC:  Chief Complaint  Patient presents with  . Follow-up    2 week follow up no concerns.     HPI Scott Donaldson presents for follow-up of hypertension, anemia and hypobulimia secondary to acute urine retention.  Patient had discontinued her olmesartan at my direction secondary to hypotension caused by decreased fluid intake secondary to acute urine retention.  He has under work-up by urology for this problem.  Continues with an indwelling catheter.  Follow-up as planned next week.  Review of the blood work did show normocytic anemia.  Past Medical History:  Diagnosis Date  . Abdominal pain 08/04/2016  . Abnormal PSA 05/19/2015  . Arthralgia 11/12/2010  . Benign prostatic hyperplasia with urinary hesitancy 06/14/2018  . Cataract   . CELLULITIS, FOOT, RIGHT 07/02/2008   Qualifier: Diagnosis of  By: Marca Ancona RMA, Lucy    . Cough 08/04/2016  . Diabetes ([place]) 03/04/2007   Qualifier: Diagnosis of  By: Loanne Drilling MD, Jacelyn Pi   . DIABETES MELLITUS, TYPE II 03/04/2007  . Edema 07/02/2008   Qualifier: Diagnosis of  By: Marca Ancona RMA, Lucy    . Elevated LDL cholesterol level 06/14/2018  . Foot ulcer, right ([place]) 04/15/2014  . GERD (gastroesophageal reflux disease)   . HYPERCHOLESTEROLEMIA 07/29/2008  . HYPERTENSION 03/04/2007  . Knee pain, left 05/16/2015  . Loss of vision 04/15/2014  . Nonintractable episodic headache 12/01/2016  . Psoriasis   . SHOULDER PAIN, BILATERAL 01/12/2010   Qualifier: Diagnosis of  By: Loanne Drilling MD, Jacelyn Pi   . Swelling of left foot 08/05/2017  . 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 COLONOSCOPY    . LEFT HEART CATH AND CORONARY ANGIOGRAPHY N/A 07/12/2018   Procedure: LEFT HEART CATH AND CORONARY ANGIOGRAPHY;  Surgeon: Wellington Hampshire, MD;   Location: [facility];  Service: Cardiovascular;  Laterality: N/A;  . stress cardiolite  05/30/2003    Family History  Problem Relation Age of Onset  . Cancer Mother        Breast Cancer  . Cancer Father        uncertain type  . Diabetes Father   . Cancer Brother 84       Colon Cancer  . Colon cancer Neg Hx   . Rectal cancer Neg Hx   . Stomach cancer Neg Hx     Social History   Socioeconomic History  . Marital status: Single    Spouse name: Not on file  . Number of children: 2  . Years of education: 10  . Highest education level: Not on file  Occupational History  . Occupation: roofer  Tobacco Use  . Smoking status: Former Smoker    Quit date: 07/26/1988    Years since quitting: 31.7  . Smokeless tobacco: Never Used  Vaping Use  . Vaping Use: Never used  Substance and Sexual Activity  . Alcohol use: No  . Drug use: No  . Sexual activity: Not on file  Other Topics Concern  . Not on file  Social History Narrative   Lives with daughter in a one story home.  Has 2 children.  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  Education: 10th grade.    Social Determinants of Health  Financial Resource Strain:   . Difficulty of Paying Living Expenses: Not on file  Food Insecurity:   . Worried About Charity fundraiser in the Last Year: Not on file  . Ran Out of Food in the Last Year: Not on file  Transportation Needs:   . Lack of Transportation (Medical): Not on file  . Lack of Transportation (Non-Medical): Not on file  Physical Activity:   . Days of Exercise per Week: Not on file  . Minutes of Exercise per Session: Not on file  Stress:   . Feeling of Stress : Not on file  Social Connections:   . Frequency of Communication with Friends and Family: Not on file  . Frequency of Social Gatherings with Friends and Family: Not on file  . Attends Religious Services: Not on file  . Active Member of Clubs or Organizations: Not on file  . Attends Archivist Meetings:  Not on file  . Marital Status: Not on file  Intimate Partner Violence:   . Fear of Current or Ex-Partner: Not on file  . Emotionally Abused: Not on file  . Physically Abused: Not on file  . Sexually Abused: Not on file    Outpatient Medications Prior to Visit  Medication Sig Dispense Refill  . aspirin EC 81 MG tablet Take 1 tablet (81 mg total) by mouth daily.    [name] atorvastatin (LIPITOR) 40 MG tablet Take 1 tablet (40 mg total) by mouth daily. 90 tablet 3  . diclofenac Sodium (VOLTAREN) 1 % GEL Apply a small grape sized dollop to tender space of right knee up to 4 times daily. (Patient taking differently: Apply 2 g topically 4 (four) times daily as needed (right knee pain). Apply a small grape sized dollop to tender space of right knee up to 4 times daily.) 150 g 1  . gabapentin (NEURONTIN) 300 MG capsule Take one at night for one week and then increase to one twice daily. (Patient taking differently: Take 300 mg by mouth at bedtime. ) 60 capsule 3  . glucose blood test strip USE TO CHECK BLOOD SUGAR TWICE DAILY 100 each 12  . insulin NPH-regular Human (70-30) 100 UNIT/ML injection Inject 36 Units into the skin 2 (two) times daily with a meal. 10 mL 11  . ketoconazole (NIZORAL) 2 % cream Apply to glans once daily 15 g 0  . metFORMIN (GLUCOPHAGE-XR) 500 MG 24 hr tablet Take 2 tablets (1,000 mg total) by mouth 2 (two) times daily. 360 tablet 3  . olmesartan (BENICAR) 20 MG tablet TAKE 1 TABLET(20 MG) BY MOUTH DAILY (Patient taking differently: Take 20 mg by mouth daily. TAKE 1 TABLET(20 MG) BY MOUTH DAILY) 90 tablet 0  . tamsulosin (FLOMAX) 0.4 MG CAPS capsule Take 0.4 mg by mouth daily.     . colchicine 0.6 MG tablet Take 1 tablet (0.6 mg total) by mouth 2 (two) times daily. (Patient not taking: Reported on 04/04/2020) 60 tablet 2  . dapagliflozin propanediol (FARXIGA) 5 MG TABS tablet Take 5 mg by mouth daily before breakfast. (Patient not taking: Reported on 03/28/2020) 30 tablet 4  . traMADol  (ULTRAM) 50 MG tablet Take one at night as needed. (Patient not taking: Reported on 03/28/2020) 15 tablet 0   No facility-administered medications prior to visit.    No Known Allergies  ROS Review of Systems  Constitutional: Negative.   HENT: Negative.   Eyes: Negative for photophobia and visual disturbance.  Respiratory: Negative.   Cardiovascular: Negative.  Gastrointestinal: Negative.   Genitourinary: Negative for difficulty urinating.  Musculoskeletal: Positive for gait problem.  Skin: Negative for pallor and rash.  Allergic/Immunologic: Negative for immunocompromised state.  Neurological: Negative for weakness and light-headedness.  Psychiatric/Behavioral: Negative.       Objective:    Physical Exam Vitals and nursing note reviewed.  Constitutional:      General: He is not in acute distress.    Appearance: Normal appearance. He is not ill-appearing, toxic-appearing or diaphoretic.  HENT:     Head: Normocephalic and atraumatic.     Right Ear: External ear normal.  Eyes:     General: No scleral icterus.       Right eye: No discharge.        Left eye: No discharge.     Conjunctiva/sclera: Conjunctivae normal.  Cardiovascular:     Rate and Rhythm: Normal rate and regular rhythm.  Pulmonary:     Effort: Pulmonary effort is normal.     Breath sounds: Normal breath sounds.  Abdominal:     Tenderness: There is no right CVA tenderness or left CVA tenderness.  Musculoskeletal:     Right lower leg: No edema.     Left lower leg: No edema.  Skin:    General: Skin is warm and dry.  Neurological:     Mental Status: He is alert and oriented to person, place, and time.  Psychiatric:        Mood and Affect: Mood normal.        Behavior: Behavior normal.     BP (!) 154/78   Pulse (!) 57   Temp 98.9 F (37.2 C) (Tympanic)   Ht 5\' 11"  (1.803 m)   Wt 201 lb 9.6 oz (91.4 kg)   SpO2 97%   BMI 28.12 kg/m  Wt Readings from Last 3 Encounters:  04/17/20 201 lb 9.6 oz  (91.4 kg)  04/06/20 196 lb 3.4 oz (89 kg)  04/04/20 198 lb 3.2 oz (89.9 kg)     Health Maintenance Due  Topic Date Due  . COVID-19 Vaccine (1) Never done  . FOOT EXAM  09/08/2019  . INFLUENZA VACCINE  02/24/2020    There are no preventive care reminders to display for this patient.  Lab Results  Component Value Date   TSH 1.18 08/04/2016   Lab Results  Component Value Date   WBC 7.3 04/17/2020   HGB 11.2 (L) 04/17/2020   HCT 32.9 (L) 04/17/2020   MCV 88.0 04/17/2020   PLT 204.0 04/17/2020   Lab Results  Component Value Date   NA 140 04/17/2020   K 4.2 04/17/2020   CO2 27 04/17/2020   GLUCOSE 153 (H) 04/17/2020   BUN 14 04/17/2020   CREATININE 0.80 04/17/2020   BILITOT 0.7 03/28/2020   ALKPHOS 54 03/28/2020   AST 18 03/28/2020   ALT 17 03/28/2020   PROT 7.0 03/28/2020   ALBUMIN 3.9 03/28/2020   CALCIUM 9.1 04/17/2020   ANIONGAP 13 04/06/2020   GFR 95.59 04/17/2020   Lab Results  Component Value Date   CHOL 198 07/23/2019   Lab Results  Component Value Date   HDL 42.60 07/23/2019   Lab Results  Component Value Date   LDLCALC 117 (H) 07/23/2019   Lab Results  Component Value Date   TRIG 191.0 (H) 07/23/2019   Lab Results  Component Value Date   CHOLHDL 5 07/23/2019   Lab Results  Component Value Date   HGBA1C 8.4 (H) 03/27/2020  Assessment & Plan:   Problem List Items Addressed This Visit      Cardiovascular and Mediastinum   Essential hypertension - Primary   Relevant Orders   Basic metabolic panel (Completed)     Genitourinary   Urinary retention     Other   Anemia   Relevant Medications   Iron, Ferrous Sulfate, 325 (65 Fe) MG TABS   Cyanocobalamin (B-12) 500 MCG TABS   Other Relevant Orders   CBC (Completed)   B12 and Folate Panel (Completed)   Iron, TIBC and Ferritin Panel (Completed)   Iron deficiency   Relevant Medications   Iron, Ferrous Sulfate, 325 (65 Fe) MG TABS   B12 deficiency   Relevant Medications    Cyanocobalamin (B-12) 500 MCG TABS      Meds ordered this encounter  Medications  . Iron, Ferrous Sulfate, 325 (65 Fe) MG TABS    Sig: Take one daily    Dispense:  90 tablet    Refill:  4  . Cyanocobalamin (B-12) 500 MCG TABS    Sig: Take one daily.    Dispense:  90 tablet    Refill:  4    Follow-up: Return in about 1 month (around 05/17/2020), or Restart Olmesartan..  Will restart olmesartan.  Following up on anemia.  He will follow up with urology for urinary retention.  Hopefully this problem will be resolved for him with removal of his indwelling catheter.  [name], MD

## 2020-04-18 DIAGNOSIS — E538 Deficiency of other specified B group vitamins: Secondary | ICD-10-CM | POA: Insufficient documentation

## 2020-04-18 DIAGNOSIS — E611 Iron deficiency: Secondary | ICD-10-CM | POA: Insufficient documentation

## 2020-04-18 LAB — IRON,TIBC AND FERRITIN PANEL
%SAT: 11 % (calc) — ABNORMAL LOW (ref 20–48)
Ferritin: 112 ng/mL (ref 24–380)
Iron: 36 ug/dL — ABNORMAL LOW (ref 50–180)
TIBC: 320 mcg/dL (calc) (ref 250–425)

## 2020-04-18 MED ORDER — IRON (FERROUS SULFATE) 325 (65 FE) MG PO TABS: ORAL_TABLET | ORAL | 4 refills | Status: AC

## 2020-04-18 MED ORDER — B-12 500 MCG PO TABS: ORAL_TABLET | ORAL | 4 refills | Status: DC

## 2020-04-18 NOTE — Addendum Note (Signed)
Addended by: Jon Billings on: 04/18/2020 07:31 AM   Modules accepted: Orders

## 2020-04-24 DIAGNOSIS — R338 Other retention of urine: Secondary | ICD-10-CM | POA: Diagnosis not present

## 2020-05-15 DIAGNOSIS — R338 Other retention of urine: Secondary | ICD-10-CM | POA: Diagnosis not present

## 2020-05-16 ENCOUNTER — Other Ambulatory Visit: Payer: Self-pay | Admitting: Family Medicine

## 2020-05-16 DIAGNOSIS — I10 Essential (primary) hypertension: Secondary | ICD-10-CM

## 2020-05-16 DIAGNOSIS — Z794 Long term (current) use of insulin: Secondary | ICD-10-CM

## 2020-05-16 DIAGNOSIS — E1142 Type 2 diabetes mellitus with diabetic polyneuropathy: Secondary | ICD-10-CM

## 2020-05-16 NOTE — Telephone Encounter (Signed)
Last OV 04/17/20 Last fill 01/08/20  #90/0

## 2020-05-21 ENCOUNTER — Other Ambulatory Visit: Payer: Self-pay | Admitting: Urology

## 2020-05-26 ENCOUNTER — Other Ambulatory Visit: Payer: Self-pay

## 2020-05-26 ENCOUNTER — Encounter (HOSPITAL_BASED_OUTPATIENT_CLINIC_OR_DEPARTMENT_OTHER): Payer: Self-pay | Admitting: Urology

## 2020-05-26 NOTE — Progress Notes (Addendum)
Spoke w/ via phone for pre-op interview---pt daughter Ellison Hughs per pt request Lab needs dos----  none             Lab results------has lab appt 06-02-2020 1300 for cbc, bmet COVID test ------06-02-2020 1500 Arrive at -------1100 am [phone] NPO after MN NO Solid Food.  Clear liquids from MN until---1000 am then npo Medications to take morning of surgery -----tamsulosin Diabetic medication -----take 1/2 dose 70/30 insulin hs on 06-03-2020, no dm meds or insulin day of surgery Patient Special Instructions -----none Pre-Op special Istructions -----none Patient verbalized understanding of instructions that were given at this phone interview. Patient denies shortness of breath, chest pain, fever, cough at this phone interview.  Anesthesia Review: NO,  saw lori gerhardt for left arm pain which has since resolved 02-14-2019 note said f/u in 6 months, hx of nonobstructive cad lbbb, now followed by dr Alfonso Ramus per pt daughter   PCP: dr Ali Lowe Cardiologist : none regular  Chest x-ray :03-27-2020 epic (77mm nodular denisity right lowerr lung field, favored to represent nipple shadow) EKG : 03-27-2020 epic Echo : Stress test:07-04-2018 Cardiac Cath : 07-12-2018 epic Activity level:  Walks up flight of stairs without problems Sleep Study/ CPAP :none Fasting Blood Sugar  128-225:      / Checks Blood Sugar 2-- times a day:   Blood Thinner/ Instructions /Last Dose:n/a ASA / Instructions/ Last Dose : instructed per dr Genella Rife to stop asa x 5 days before surgery

## 2020-05-28 ENCOUNTER — Telehealth: Payer: Self-pay

## 2020-05-28 DIAGNOSIS — Z794 Long term (current) use of insulin: Secondary | ICD-10-CM

## 2020-05-28 DIAGNOSIS — E1142 Type 2 diabetes mellitus with diabetic polyneuropathy: Secondary | ICD-10-CM

## 2020-05-28 NOTE — Progress Notes (Addendum)
Chronic Care Management Pharmacy Assistant   Name: Scott Donaldson  MRN: [id number] DOB: [birth year]  Reason for Encounter:Diabetes Disease State Call.  PCP : [name], MD  Allergies:  No Known Allergies  Medications: Outpatient Encounter Medications as of 05/28/2020  Medication Sig  . aspirin EC 81 MG tablet Take 1 tablet (81 mg total) by mouth daily.  [name] atorvastatin (LIPITOR) 40 MG tablet Take 1 tablet (40 mg total) by mouth daily. (Patient taking differently: Take 40 mg by mouth at bedtime. )  . calcium carbonate (TUMS - DOSED IN MG ELEMENTAL CALCIUM) 500 MG chewable tablet Chew 1 tablet by mouth as needed for indigestion or heartburn.  . Cyanocobalamin (B-12) 500 MCG TABS Take one daily. (Patient not taking: Reported on 05/26/2020)  . diclofenac Sodium (VOLTAREN) 1 % GEL Apply a small grape sized dollop to tender space of right knee up to 4 times daily. (Patient taking differently: Apply 2 g topically 4 (four) times daily as needed (right knee pain). Apply a small grape sized dollop to tender space of right knee up to 4 times daily.)  . gabapentin (NEURONTIN) 300 MG capsule Take one at night for one week and then increase to one twice daily. (Patient taking differently: Take 300 mg by mouth at bedtime. )  . glucose blood test strip USE TO CHECK BLOOD SUGAR TWICE DAILY  . insulin NPH-regular Human (70-30) 100 UNIT/ML injection Inject 36 Units into the skin 2 (two) times daily with a meal.  . Iron, Ferrous Sulfate, 325 (65 Fe) MG TABS Take one daily (Patient not taking: Reported on 05/26/2020)  . ketoconazole (NIZORAL) 2 % cream Apply to glans once daily (Patient taking differently: 2 (two) times daily as needed. )  . metFORMIN (GLUCOPHAGE-XR) 500 MG 24 hr tablet Take 2 tablets (1,000 mg total) by mouth 2 (two) times daily.  . metoprolol succinate (TOPROL-XL) 50 MG 24 hr tablet Take 50 mg by mouth at bedtime. Take with or immediately following a meal.  . olmesartan  (BENICAR) 20 MG tablet Take 1 tablet (20 mg total) by mouth daily. TAKE 1 TABLET(20 MG) BY MOUTH DAILY (Patient taking differently: Take 20 mg by mouth at bedtime. TAKE 1 TABLET(20 MG) BY MOUTH DAILY)  . tamsulosin (FLOMAX) 0.4 MG CAPS capsule Take 0.4 mg by mouth in the morning and at bedtime.    No facility-administered encounter medications on file as of 05/28/2020.    Current Diagnosis: Patient Active Problem List   Diagnosis Date Noted  . Iron deficiency 04/18/2020  . B12 deficiency 04/18/2020  . Anemia 04/17/2020  . Urinary retention 04/17/2020  . Balanitis 04/04/2020  . Hospital discharge follow-up 04/04/2020  . Prostatitis 03/28/2020  . Hypotension due to hypovolemia 03/27/2020  . Hyperkalemia 03/27/2020  . Acute kidney injury ([place]) 03/27/2020  . Right knee pain 03/07/2020  . Acute idiopathic gout of right knee 02/25/2020  . Bilateral carotid bruits 12/05/2019  . Type 2 diabetes mellitus with diabetic polyneuropathy, with long-term current use of insulin ([place]) 09/05/2019  . Diabetes mellitus ([place]) 09/05/2019  . Cervical radiculopathy 02/12/2019  . Constipation 02/12/2019  . DOE (dyspnea on exertion) 02/12/2019  . Chest pain 02/12/2019  . No-show for appointment 11/21/2018  . Coronary artery disease involving native heart 07/20/2018  . Bronchitis 07/20/2018  . Abnormal stress test   . Benign prostatic hyperplasia with urinary hesitancy 06/14/2018  . Elevated LDL cholesterol level 06/14/2018  . Swelling of left foot 08/05/2017  . Nonintractable episodic headache  12/01/2016  . Cough 08/04/2016  . Lower abdominal pain 08/04/2016  . Abnormal PSA 05/19/2015  . OA (osteoarthritis) of knee 05/16/2015  . Foot ulcer, right ([place]) 04/15/2014  . Loss of vision 04/15/2014  . Screening for prostate cancer 11/03/2012  . Routine general medical examination at a health care facility 11/03/2012  . Arthralgia 11/12/2010  . SHOULDER PAIN, BILATERAL 01/12/2010  . TUBULOVILLOUS ADENOMA,  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  . HYPERCHOLESTEROLEMIA 07/29/2008  . CELLULITIS, FOOT, RIGHT 07/02/2008  . EDEMA 07/02/2008  . Type 2 diabetes mellitus with hyperglycemia, with long-term current use of insulin ([place]) 03/04/2007  . Essential hypertension 03/04/2007    Follow-Up:  Pharmacist Review   Recent Relevant Labs: Lab Results  Component Value Date/Time   HGBA1C 8.4 (H) 03/27/2020 11:15 PM   HGBA1C 9.3 (H) 12/05/2019 11:17 AM   MICROALBUR 40.5 (H) 12/05/2019 11:17 AM   MICROALBUR 37.8 (H) 07/23/2019 08:59 AM    Kidney Function Lab Results  Component Value Date/Time   CREATININE 0.80 04/17/2020 10:01 AM   CREATININE 0.95 04/06/2020 10:02 PM   GFR 95.59 04/17/2020 10:01 AM   GFRNONAA >60 04/06/2020 10:02 PM   GFRAA >60 04/06/2020 10:02 PM    . Current antihyperglycemic regimen:  o Insulin NPH- regular (70/30)- 36 units twice daily  o Metformin 500MG  XR 2 tablets twice daily  . What recent interventions/DTPs have been made to improve glycemic control:  o None ID  . Have there been any recent hospitalizations or ED visits since last visit with CPP? No . Patient reports hypoglycemic symptoms, including Pale, Sweaty, Shaky, Nervous/irritable and Vision changes  o Patient daughter states patient will drink some orange juice or sprite and checks his blood sugar for relief (the lowest it has been is 17, patient daughter states this usually happens weekly).Patient daughter reports patient wakes up in the middle of the night or morning with a low blood sugar reading. . Patient reports hyperglycemic symptoms, including blurry vision, excessive thirst, fatigue, polyuria and weakness . How often are you checking your blood sugar? once daily . What are your blood sugars ranging?  o Patient daughter states his blood sugar can range around 122-215 o Fasting: N/A o Before meals: N/A o After meals: N/A o Bedtime: N/A  . During the week, how often does your blood glucose drop below 70? Patient  daugther reports patient blood sugar has drop below 70 usually on a  weekly basis but not this week. . Are you checking your feet daily/regularly?   Patient daughter states patient denies numbness , pain or tingling sensation in his feet.   Adherence Review: Is the patient currently on a STATIN medication? Yes Is the patient currently on ACE/ARB medication? Yes Does the patient have >5 day gap between last estimated fill dates? No   Joycelyn Schmid Clinical Pharmacist Assistant [phone]    Addendum 06/05/20: Given frequency of hypoglycemia unsure if related to differences in eating habits or if possibly related to timing issue with insulin administration. Counseled patient to take insulin 30 minutes prior to meals. Will follow-up with patient in one month.   Doristine Section Clinical Pharmacist [facility]  [phone]

## 2020-05-31 ENCOUNTER — Other Ambulatory Visit (HOSPITAL_COMMUNITY): Payer: Medicare Other

## 2020-06-02 ENCOUNTER — Other Ambulatory Visit (HOSPITAL_COMMUNITY)
Admission: RE | Admit: 2020-06-02 | Discharge: 2020-06-02 | Disposition: A | Discharge status: Home / Self Care | Payer: Medicare Other | Source: Ambulatory Visit | Attending: Urology | Admitting: Urology

## 2020-06-02 ENCOUNTER — Other Ambulatory Visit: Payer: Self-pay

## 2020-06-02 ENCOUNTER — Encounter (HOSPITAL_COMMUNITY)
Admission: RE | Admit: 2020-06-02 | Discharge: 2020-06-02 | Disposition: A | Discharge status: Home / Self Care | Payer: Medicare Other | Source: Ambulatory Visit | Attending: Urology | Admitting: Urology

## 2020-06-02 DIAGNOSIS — Z01812 Encounter for preprocedural laboratory examination: Secondary | ICD-10-CM | POA: Insufficient documentation

## 2020-06-02 DIAGNOSIS — Z20822 Contact with and (suspected) exposure to covid-19: Secondary | ICD-10-CM | POA: Diagnosis not present

## 2020-06-02 LAB — BASIC METABOLIC PANEL
Anion gap: 10 (ref 5–15)
BUN: 18 mg/dL (ref 8–23)
CO2: 22 mmol/L (ref 22–32)
Calcium: 9.1 mg/dL (ref 8.9–10.3)
Chloride: 105 mmol/L (ref 98–111)
Creatinine, Ser: 0.91 mg/dL (ref 0.61–1.24)
GFR, Estimated: >60 mL/min (ref >60)
Glucose, Bld: 315 mg/dL — ABNORMAL HIGH (ref 70–99)
Potassium: 4.3 mmol/L (ref 3.5–5.1)
Sodium: 137 mmol/L (ref 135–145)

## 2020-06-02 LAB — CBC
HCT: 34.9 % — ABNORMAL LOW (ref 39.0–52.0)
Hemoglobin: 11.7 g/dL — ABNORMAL LOW (ref 13.0–17.0)
MCH: 29.1 pg (ref 26.0–34.0)
MCHC: 33.5 g/dL (ref 30.0–36.0)
MCV: 86.8 fL (ref 80.0–100.0)
Platelets: 231 10*3/uL (ref 150–400)
RBC: 4.02 MIL/uL — ABNORMAL LOW (ref 4.22–5.81)
RDW: 13 % (ref 11.5–15.5)
WBC: 6.3 10*3/uL (ref 4.0–10.5)
nRBC: 0 % (ref 0.0–0.2)

## 2020-06-02 LAB — SARS CORONAVIRUS 2 (TAT 6-24 HRS): SARS Coronavirus 2: NEGATIVE

## 2020-06-03 ENCOUNTER — Encounter: Payer: Self-pay | Admitting: Family Medicine

## 2020-06-03 NOTE — Progress Notes (Signed)
Left message with connie for dr winter patient blood glucose was 315 with pre op labs done 06-02-2020

## 2020-06-04 ENCOUNTER — Encounter (HOSPITAL_BASED_OUTPATIENT_CLINIC_OR_DEPARTMENT_OTHER)
Admission: RE | Disposition: A | Discharge status: Home / Self Care | Payer: Self-pay | Source: Home / Self Care | Attending: Urology

## 2020-06-04 ENCOUNTER — Ambulatory Visit (HOSPITAL_BASED_OUTPATIENT_CLINIC_OR_DEPARTMENT_OTHER): Payer: Medicare Other | Admitting: Anesthesiology

## 2020-06-04 ENCOUNTER — Observation Stay (HOSPITAL_BASED_OUTPATIENT_CLINIC_OR_DEPARTMENT_OTHER)
Admission: RE | Admit: 2020-06-04 | Discharge: 2020-06-05 | Disposition: A | Discharge status: Home / Self Care | Payer: Medicare Other | Attending: Urology | Admitting: Urology

## 2020-06-04 ENCOUNTER — Other Ambulatory Visit: Payer: Self-pay

## 2020-06-04 ENCOUNTER — Encounter (HOSPITAL_BASED_OUTPATIENT_CLINIC_OR_DEPARTMENT_OTHER): Payer: Self-pay | Admitting: Urology

## 2020-06-04 DIAGNOSIS — Z794 Long term (current) use of insulin: Secondary | ICD-10-CM | POA: Diagnosis not present

## 2020-06-04 DIAGNOSIS — R972 Elevated prostate specific antigen [PSA]: Secondary | ICD-10-CM | POA: Insufficient documentation

## 2020-06-04 DIAGNOSIS — N320 Bladder-neck obstruction: Secondary | ICD-10-CM | POA: Diagnosis not present

## 2020-06-04 DIAGNOSIS — I10 Essential (primary) hypertension: Secondary | ICD-10-CM | POA: Diagnosis not present

## 2020-06-04 DIAGNOSIS — E109 Type 1 diabetes mellitus without complications: Secondary | ICD-10-CM | POA: Diagnosis not present

## 2020-06-04 DIAGNOSIS — N401 Enlarged prostate with lower urinary tract symptoms: Principal | ICD-10-CM | POA: Diagnosis present

## 2020-06-04 DIAGNOSIS — Z8546 Personal history of malignant neoplasm of prostate: Secondary | ICD-10-CM | POA: Diagnosis not present

## 2020-06-04 DIAGNOSIS — Z79899 Other long term (current) drug therapy: Secondary | ICD-10-CM | POA: Insufficient documentation

## 2020-06-04 DIAGNOSIS — N138 Other obstructive and reflux uropathy: Secondary | ICD-10-CM | POA: Diagnosis not present

## 2020-06-04 DIAGNOSIS — R339 Retention of urine, unspecified: Secondary | ICD-10-CM | POA: Diagnosis present

## 2020-06-04 DIAGNOSIS — Z7984 Long term (current) use of oral hypoglycemic drugs: Secondary | ICD-10-CM | POA: Diagnosis not present

## 2020-06-04 DIAGNOSIS — E1165 Type 2 diabetes mellitus with hyperglycemia: Secondary | ICD-10-CM | POA: Diagnosis not present

## 2020-06-04 DIAGNOSIS — R338 Other retention of urine: Secondary | ICD-10-CM | POA: Diagnosis not present

## 2020-06-04 HISTORY — DX: Anemia, unspecified: D64.9

## 2020-06-04 HISTORY — DX: Unspecified hearing loss, unspecified ear: H91.90

## 2020-06-04 HISTORY — PX: TRANSURETHRAL RESECTION OF PROSTATE: SHX73

## 2020-06-04 HISTORY — DX: Atherosclerotic heart disease of native coronary artery without angina pectoris: I25.10

## 2020-06-04 LAB — GLUCOSE, CAPILLARY
Glucose-Capillary: 201 mg/dL — ABNORMAL HIGH (ref 70–99)
Glucose-Capillary: 158 mg/dL — ABNORMAL HIGH (ref 70–99)
Glucose-Capillary: 199 mg/dL — ABNORMAL HIGH (ref 70–99)
Glucose-Capillary: 154 mg/dL — ABNORMAL HIGH (ref 70–99)

## 2020-06-04 SURGERY — TURP (TRANSURETHRAL RESECTION OF PROSTATE)
Anesthesia: General | Site: Prostate

## 2020-06-04 MED ORDER — BELLADONNA ALKALOIDS-OPIUM 16.2-60 MG RE SUPP
RECTAL | Status: AC
  Filled 2020-06-04: qty 1

## 2020-06-04 MED ORDER — ONDANSETRON HCL 4 MG/2ML IJ SOLN
INTRAMUSCULAR | Status: AC
  Filled 2020-06-04: qty 2

## 2020-06-04 MED ORDER — ONDANSETRON HCL 4 MG/2ML IJ SOLN: 4.0000 mg | Freq: Once | INTRAMUSCULAR | Status: DC | PRN

## 2020-06-04 MED ORDER — SODIUM CHLORIDE 0.9 % IR SOLN
3000.0000 mL | Status: DC
  Administered 2020-06-04 (×5): 3000 mL

## 2020-06-04 MED ORDER — HYDROCODONE-ACETAMINOPHEN 5-325 MG PO TABS
1.0000 | ORAL_TABLET | ORAL | Status: DC | PRN
  Administered 2020-06-04: 2 via ORAL

## 2020-06-04 MED ORDER — DEXAMETHASONE SODIUM PHOSPHATE 4 MG/ML IJ SOLN
INTRAMUSCULAR | Status: DC | PRN
  Administered 2020-06-04: 4 mg via INTRAVENOUS

## 2020-06-04 MED ORDER — OXYBUTYNIN CHLORIDE 5 MG PO TABS: 5.0000 mg | ORAL_TABLET | Freq: Three times a day (TID) | ORAL | Status: DC | PRN

## 2020-06-04 MED ORDER — BELLADONNA ALKALOIDS-OPIUM 16.2-60 MG RE SUPP
RECTAL | Status: DC | PRN
  Administered 2020-06-04: 1 via RECTAL

## 2020-06-04 MED ORDER — GLYCOPYRROLATE PF 0.2 MG/ML IJ SOSY
PREFILLED_SYRINGE | INTRAMUSCULAR | Status: DC | PRN
  Administered 2020-06-04: .2 mg via INTRAVENOUS

## 2020-06-04 MED ORDER — TRAMADOL HCL 50 MG PO TABS: 50.0000 mg | ORAL_TABLET | Freq: Four times a day (QID) | ORAL | 0 refills | Status: AC | PRN

## 2020-06-04 MED ORDER — FENTANYL CITRATE (PF) 100 MCG/2ML IJ SOLN
INTRAMUSCULAR | Status: AC
  Filled 2020-06-04: qty 2

## 2020-06-04 MED ORDER — CEPHALEXIN 500 MG PO CAPS: 500.0000 mg | ORAL_CAPSULE | Freq: Two times a day (BID) | ORAL | 0 refills | Status: AC

## 2020-06-04 MED ORDER — MORPHINE SULFATE (PF) 2 MG/ML IV SOLN
INTRAVENOUS | Status: AC
  Filled 2020-06-04: qty 1

## 2020-06-04 MED ORDER — SODIUM CHLORIDE 0.9 % IV SOLN
INTRAVENOUS | Status: DC
  Administered 2020-06-04: 75 mL/h via INTRAVENOUS

## 2020-06-04 MED ORDER — ONDANSETRON HCL 4 MG/2ML IJ SOLN: 4.0000 mg | INTRAMUSCULAR | Status: DC | PRN

## 2020-06-04 MED ORDER — MORPHINE SULFATE (PF) 4 MG/ML IV SOLN
2.0000 mg | INTRAVENOUS | Status: DC | PRN
  Administered 2020-06-04: 2 mg via INTRAVENOUS

## 2020-06-04 MED ORDER — LIDOCAINE 2% (20 MG/ML) 5 ML SYRINGE
INTRAMUSCULAR | Status: AC
  Filled 2020-06-04: qty 5

## 2020-06-04 MED ORDER — IRBESARTAN 150 MG PO TABS
150.0000 mg | ORAL_TABLET | Freq: Every day | ORAL | Status: DC
  Administered 2020-06-04: 150 mg via ORAL
  Filled 2020-06-04: qty 1

## 2020-06-04 MED ORDER — DIPHENHYDRAMINE HCL 50 MG/ML IJ SOLN: 12.5000 mg | Freq: Four times a day (QID) | INTRAMUSCULAR | Status: DC | PRN

## 2020-06-04 MED ORDER — GLYCOPYRROLATE PF 0.2 MG/ML IJ SOSY
PREFILLED_SYRINGE | INTRAMUSCULAR | Status: AC
  Filled 2020-06-04: qty 1

## 2020-06-04 MED ORDER — DEXAMETHASONE SODIUM PHOSPHATE 10 MG/ML IJ SOLN
INTRAMUSCULAR | Status: AC
  Filled 2020-06-04: qty 1

## 2020-06-04 MED ORDER — DIPHENHYDRAMINE HCL 12.5 MG/5ML PO ELIX: 12.5000 mg | ORAL_SOLUTION | Freq: Four times a day (QID) | ORAL | Status: DC | PRN

## 2020-06-04 MED ORDER — LIDOCAINE HCL (CARDIAC) PF 100 MG/5ML IV SOSY
PREFILLED_SYRINGE | INTRAVENOUS | Status: DC | PRN
  Administered 2020-06-04: 100 mg via INTRAVENOUS

## 2020-06-04 MED ORDER — LACTATED RINGERS IV SOLN: INTRAVENOUS | Status: DC

## 2020-06-04 MED ORDER — METOPROLOL SUCCINATE ER 50 MG PO TB24
50.0000 mg | ORAL_TABLET | Freq: Every day | ORAL | Status: DC
  Administered 2020-06-04: 50 mg via ORAL
  Filled 2020-06-04: qty 1

## 2020-06-04 MED ORDER — FENTANYL CITRATE (PF) 100 MCG/2ML IJ SOLN: 25.0000 ug | INTRAMUSCULAR | Status: DC | PRN

## 2020-06-04 MED ORDER — PROPOFOL 10 MG/ML IV BOLUS
INTRAVENOUS | Status: DC | PRN
  Administered 2020-06-04: 150 mg via INTRAVENOUS

## 2020-06-04 MED ORDER — ATORVASTATIN CALCIUM 40 MG PO TABS
40.0000 mg | ORAL_TABLET | Freq: Every day | ORAL | Status: DC
  Administered 2020-06-04: 40 mg via ORAL
  Filled 2020-06-04: qty 1

## 2020-06-04 MED ORDER — OXYBUTYNIN CHLORIDE 5 MG PO TABS: 5.0000 mg | ORAL_TABLET | Freq: Three times a day (TID) | ORAL | 1 refills | Status: DC | PRN

## 2020-06-04 MED ORDER — MEPERIDINE HCL 25 MG/ML IJ SOLN: 6.2500 mg | INTRAMUSCULAR | Status: DC | PRN

## 2020-06-04 MED ORDER — INSULIN ASPART 100 UNIT/ML ~~LOC~~ SOLN: 0.0000 [IU] | Freq: Every day | SUBCUTANEOUS | Status: DC

## 2020-06-04 MED ORDER — SODIUM CHLORIDE 0.9 % IR SOLN
Status: DC | PRN
  Administered 2020-06-04 (×6): 3000 mL

## 2020-06-04 MED ORDER — EPHEDRINE 5 MG/ML INJ
INTRAVENOUS | Status: AC
  Filled 2020-06-04: qty 10

## 2020-06-04 MED ORDER — ACETAMINOPHEN 325 MG PO TABS: 650.0000 mg | ORAL_TABLET | ORAL | Status: DC | PRN

## 2020-06-04 MED ORDER — HYDROCODONE-ACETAMINOPHEN 5-325 MG PO TABS
ORAL_TABLET | ORAL | Status: AC
  Filled 2020-06-04: qty 2

## 2020-06-04 MED ORDER — INSULIN ASPART 100 UNIT/ML ~~LOC~~ SOLN
0.0000 [IU] | Freq: Three times a day (TID) | SUBCUTANEOUS | Status: DC
  Administered 2020-06-04 – 2020-06-05 (×2): 3 [IU] via SUBCUTANEOUS

## 2020-06-04 MED ORDER — EPHEDRINE SULFATE-NACL 50-0.9 MG/10ML-% IV SOSY
PREFILLED_SYRINGE | INTRAVENOUS | Status: DC | PRN
  Administered 2020-06-04: 10 mg via INTRAVENOUS
  Administered 2020-06-04: 15 mg via INTRAVENOUS
  Administered 2020-06-04 (×2): 10 mg via INTRAVENOUS

## 2020-06-04 MED ORDER — ONDANSETRON HCL 4 MG/2ML IJ SOLN
INTRAMUSCULAR | Status: DC | PRN
  Administered 2020-06-04: 4 mg via INTRAVENOUS

## 2020-06-04 MED ORDER — INSULIN ASPART PROT & ASPART (70-30 MIX) 100 UNIT/ML ~~LOC~~ SUSP
36.0000 [IU] | Freq: Two times a day (BID) | SUBCUTANEOUS | Status: DC
  Administered 2020-06-04 – 2020-06-05 (×2): 36 [IU] via SUBCUTANEOUS
  Filled 2020-06-04: qty 10

## 2020-06-04 MED ORDER — ACETAMINOPHEN 160 MG/5ML PO SOLN: 325.0000 mg | ORAL | Status: DC | PRN

## 2020-06-04 MED ORDER — INSULIN ASPART 100 UNIT/ML ~~LOC~~ SOLN
SUBCUTANEOUS | Status: AC
  Filled 2020-06-04: qty 1

## 2020-06-04 MED ORDER — GENTAMICIN SULFATE 40 MG/ML IJ SOLN
5.0000 mg/kg | Freq: Once | INTRAVENOUS | Status: AC
  Administered 2020-06-04: 410 mg via INTRAVENOUS
  Filled 2020-06-04: qty 10.25

## 2020-06-04 MED ORDER — PHENYLEPHRINE 40 MCG/ML (10ML) SYRINGE FOR IV PUSH (FOR BLOOD PRESSURE SUPPORT)
PREFILLED_SYRINGE | INTRAVENOUS | Status: AC
  Filled 2020-06-04: qty 10

## 2020-06-04 MED ORDER — FENTANYL CITRATE (PF) 100 MCG/2ML IJ SOLN
INTRAMUSCULAR | Status: DC | PRN
  Administered 2020-06-04 (×2): 50 ug via INTRAVENOUS

## 2020-06-04 MED ORDER — BELLADONNA ALKALOIDS-OPIUM 16.2-60 MG RE SUPP: 1.0000 | Freq: Four times a day (QID) | RECTAL | Status: DC | PRN

## 2020-06-04 MED ORDER — ACETAMINOPHEN 325 MG PO TABS: 325.0000 mg | ORAL_TABLET | ORAL | Status: DC | PRN

## 2020-06-04 SURGICAL SUPPLY — 28 items
BAG DRAIN URO-CYSTO SKYTR STRL (DRAIN) ×3 IMPLANT
BAG DRN RND TRDRP ANRFLXCHMBR (UROLOGICAL SUPPLIES) ×1
BAG DRN UROCATH (DRAIN) ×1
BAG URINE DRAIN 2000ML AR STRL (UROLOGICAL SUPPLIES) ×3 IMPLANT
BAND INSRT 18 STRL LF DISP RB (MISCELLANEOUS)
BAND RUBBER #18 3X1/16 STRL (MISCELLANEOUS) IMPLANT
CATH FOLEY 3WAY 30CC 22FR (CATHETERS) ×2 IMPLANT
CATH FOLEY 3WAY 30CC 24FR (CATHETERS)
CATH URTH STD 24FR FL 3W 2 (CATHETERS) IMPLANT
GLOVE BIO SURGEON STRL SZ7.5 (GLOVE) ×3 IMPLANT
GLOVE BIOGEL M 6.5 STRL (GLOVE) ×4 IMPLANT
GLOVE BIOGEL PI IND STRL 7.0 (GLOVE) IMPLANT
GLOVE BIOGEL PI INDICATOR 7.0 (GLOVE) ×8
GOWN STRL REUS W/ TWL LRG LVL3 (GOWN DISPOSABLE) IMPLANT
GOWN STRL REUS W/TWL LRG LVL3 (GOWN DISPOSABLE) ×6
GOWN STRL REUS W/TWL XL LVL3 (GOWN DISPOSABLE) ×3 IMPLANT
HOLDER FOLEY CATH W/STRAP (MISCELLANEOUS) ×2 IMPLANT
IV NS IRRIG 3000ML ARTHROMATIC (IV SOLUTION) ×12 IMPLANT
LOOP CUT BIPOLAR 24F LRG (ELECTROSURGICAL) ×2 IMPLANT
MANIFOLD NEPTUNE II (INSTRUMENTS) ×3 IMPLANT
PACK CYSTO (CUSTOM PROCEDURE TRAY) ×3 IMPLANT
PIN SAFETY STERILE (MISCELLANEOUS) ×3 IMPLANT
SYR 30ML LL (SYRINGE) ×3 IMPLANT
SYR TOOMEY IRRIG 70ML (MISCELLANEOUS) ×3
SYRINGE TOOMEY IRRIG 70ML (MISCELLANEOUS) ×1 IMPLANT
TUBE CONNECTING 12'X1/4 (SUCTIONS) ×1
TUBE CONNECTING 12X1/4 (SUCTIONS) ×2 IMPLANT
TUBING UROLOGY SET (TUBING) ×3 IMPLANT

## 2020-06-04 NOTE — Anesthesia Procedure Notes (Signed)
Procedure Name: LMA Insertion Date/Time: 06/04/2020 10:38 AM Performed by: Lieutenant Diego, CRNA Pre-anesthesia Checklist: Patient identified, Emergency Drugs available, Suction available and Patient being monitored Patient Re-evaluated:Patient Re-evaluated prior to induction Oxygen Delivery Method: Circle system utilized Preoxygenation: Pre-oxygenation with 100% oxygen Induction Type: IV induction Ventilation: Mask ventilation without difficulty LMA: LMA inserted LMA Size: 5.0 Number of attempts: 1 Placement Confirmation: positive ETCO2 and breath sounds checked- equal and bilateral Tube secured with: Tape Dental Injury: Teeth and Oropharynx as per pre-operative assessment

## 2020-06-04 NOTE — Op Note (Signed)
Operative Note  Preoperative diagnosis:  1.  BPH with bladder outlet obstruction and urinary retention  Postoperative diagnosis: 1.  BPH with bladder outlet obstruction and urinary retention  Procedure(s): 1.  Bipolar TURP  Surgeon: Ellison Hughs, MD  Assistants:  None  Anesthesia:  General  Complications:  None  EBL: 100 mL  Specimens: 1. Prostate chips  Drains/Catheters: 1.  22 French three-way Foley catheter with 20 mL in the balloon  Intraoperative findings:   1. Trilobar prostatic urethral obstruction  Indication:  Scott Donaldson is a 69 y.o. male with a history of BPH and urinary retention.  Despite maximizing medical therapy and multiple voiding trials, the patient has been unable to spontaneously void.  He has been consented for the above procedures, voices understanding and wishes to proceed.  Description of procedure:  After informed consent was obtained, the patient was brought to the operating room and general anesthesia was administered. The patient was then placed in the dorsolithotomy position and prepped and draped in usual sterile fashion. A timeout was performed. A 23 French rigid cystoscope was then inserted into the urethral meatus and advanced into the bladder under direct vision. A complete bladder survey revealed no intravesical pathology.  Both ureteral orifices were identified and well away from the bladder neck.  The rigid cystoscope was then exchanged for a 26 French resectoscope with a bipolar loop working element.  Starting at the bladder neck and progressing distally to the verumontanum, the prostatic adenoma was systematically resected until a widely patent prostatic urethral channel was created.  All prostate chips were then hand irrigated out of the bladder and sent to pathology for permanent section.  The resectoscope was then removed and exchanged for a 22 French three-way Foley catheter.  The three-way Foley catheter was then extensively  hand irrigated until the irrigant returned clear to light pink.  The catheter was then placed to continuous bladder irrigation.  He tolerated the procedure well and was transferred to the postanesthesia unit in stable condition.  Plan:  CBI overnight

## 2020-06-04 NOTE — Transfer of Care (Signed)
Immediate Anesthesia Transfer of Care Note  Patient: Scott Donaldson  Procedure(s) Performed: TRANSURETHRAL RESECTION OF THE PROSTATE (TURP), BIPOLAR (N/A Prostate)  Patient Location: PACU  Anesthesia Type:General  Level of Consciousness: awake  Airway & Oxygen Therapy: Patient Spontanous Breathing and Patient connected to face mask oxygen  Post-op Assessment: Report given to RN and Post -op Vital signs reviewed and stable  Post vital signs: Reviewed and stable  Last Vitals:  Vitals Value Taken Time  BP    Temp    Pulse 89 06/04/20 1154  Resp 15 06/04/20 1154  SpO2 99 % 06/04/20 1154  Vitals shown include unvalidated device data.  Last Pain:  Vitals:   06/04/20 0811  TempSrc: Oral  PainSc: 0-No pain      Patients Stated Pain Goal: 6 (37/36/68 1594)  Complications: No complications documented.

## 2020-06-04 NOTE — H&P (Signed)
PRE-OP H&P  Office Visit Report     05/15/2020   --------------------------------------------------------------------------------   Scott Donaldson  MRN: [id number]  DOB: August 30, 1950, 69 year old Male   PRIMARY CARE:  Abelino Derrick  REFERRING:  Abelino Derrick  PROVIDER:  Ellison Hughs, M.D.  LOCATION:  [facility] [phone]     --------------------------------------------------------------------------------   CC/HPI: Elevated PSA   The patient is a 69 year old male with a history of an elevated PSA value and urinary retention.   PMHx: DM2, HTN, HLD, CAD and BPH   Last PSA- 8.80 (10/2019), 7.19 (05/2019), 9.45 (01/2019), 6.2 (07/2018)-- negative PNBx in 2017. His PSA is fluctuated between 3 and 9 over the past six years. No personal/family history of prostate cancer. He underwent a prostate biopsy in 2017 that was benign in all cores.   11/19/19: From a urinary standpoint, he admits that he is not taking his tamsulosin on a routine basis. He continues to have a weak FOS and doesn't feel like he is emptying his bladder well all of the time. He has occasional urgency/frequency, but is not bothered by it. Nocturia x 1. Denies interval UTIs, dysuria or hematuria.   04/01/20: Patient with above-noted history. He presented to the emergency department on 09/02 with complaints of weakness, dizziness, nausea, and lower abdominal pain. He states that the symptoms have been ongoing for about 1 week prior. He was found to be orthostatic in the emergency department with elevated creatinine. Creatinine was noted to be 2.2. Potassium of 6.9. He was having increased issues with constipation at the time, and states he had not had a bowel movement in about a week. He states that immediately prior to his admission he began having increased problems voiding. He states that he was only voiding small amounts with complaints of dysuria. He tells me today that he had not been using his  tamsulosin due to increased nausea and inability to tolerate it. CT of the abdomen and pelvis revealed a markedly enlarged prostate this some mild peri prostatic stranding. He was started on ceftriaxone for possible prostatitis. Bladder scan showed approximately 4-500 cc and an indwelling catheter was placed. I do not see a definitive document of total output, but per the patient output was over 1 L. Urinalysis was ultimately negative and there was no leukocytosis. Therefore, antibiotics were discontinued. PSA noted to be 10.4. He does continue to have ongoing issues with poor glycemic control. Most recent A1c noted to be 8.4. Creatinine at discharge was noted to be 1.11. Today he returns for ongoing evaluation. He states he has been tolerating the catheter well. He has had some hematuria this morning, but states that he has not noted any passage of clot material. His catheter is draining appropriately. He does endorse intermittent episodes of painful urgency, but denies any leakage from around the catheter. No current complaints of abdominal pain, perineal pain, or rectal pain. He continues to have some persistent issues with constipation, but did have a bowel movement yesterday. He denies fevers or chills. He is currently using tamsulosin, as prescribed. He does continue to have some ongoing dizziness and lightheadedness, which is intermittent and improving. He denies fevers or chills. No nausea or vomiting.   04/07/20: Patient with above noted hx. He presents today for possible TOV. He presented to the ED yesterday after his catheter bag stopped draining and he had increased lower abdominal pain. Per review of ER noted, he was transported via EMS and his catheter began draining  en route. At the time of his evaluation in the ED, there was approximatly 1 L output noted in his bag. UA was not overtly concerning for infectious process. Urine culture pending. Creatinine noted to be 0.95. Today he states that he is  tolerating the catheter and it has been draining well since ED discharge. He has noted some intermittent hematuria, but denies fevers. He remains on Tamsulosin. We discussed delay in TOV given yesterday's episode, but he would like to proceed.    05/15/20: The patient is here today for cystoscopy and to discuss his recent UDS findings. Despite multiple voiding trials, he continues to be in urinary retention and is requiring an indwelling Foley catheter. He notes only mild discomfort from the catheter and denies interval episodes leakage around the catheter, suprapubic pain or hematuria. Review of his CT from September revealed a prostate measuring approximately 148 cc.   UDS SUMMARY  Mr. Peasley held a max capacity of approx. 282 mls. His 1st sensation was felt at 275 mls. There was positive instability, and his first void was with urgency off an unstable contraction. Filling was continued, and he was able to generate a voluntary contraction and void. His contraction was well sustained. There was some artifact and bleed through of rectal pressure line near the end of his void. His flow was obstructed. PVR was approx. 142 mls. Trabeculation and some elevation of the bladder base was noted. No reflux was seen.     ALLERGIES: No Allergies    MEDICATIONS: Metoprolol Succinate 25 mg tablet, extended release 24 hr  Tamsulosin Hcl 0.4 mg capsule 1 capsule PO BID  Tamsulosin Hcl 0.4 mg capsule 1 capsule PO BID  Tamsulosin Hcl 0.4 mg capsule 1 capsule PO Daily  Aspir 81  Atorvastatin Calcium 20 mg tablet  Gabapentin 100 mg capsule  Olmesartan Medoxomil 20 mg tablet     GU PSH: Complex cystometrogram, w/ void pressure and urethral pressure profile studies, any technique - 04/24/2020 Complex Uroflow - 04/24/2020 Emg surf Electrd - 04/24/2020 Inject For cystogram - 04/24/2020 Intrabd voidng Press - 04/24/2020       PSH Notes: Wisdom teeth   NON-GU PSH: Cataract surgery, Bilateral     GU PMH:  Urinary Retention - 04/24/2020, - 04/07/2020, - 04/01/2020 BPH w/LUTS - 04/07/2020, - 04/01/2020, - 03/05/2019 Elevated PSA - 04/01/2020, Elevated prostate specific antigen (PSA), - 2017 Proteinuria - 11/19/2019 Nocturia - 03/05/2019 Urinary Urgency - 03/05/2019    NON-GU PMH: Glycosuria - 11/19/2019 Encounter for general adult medical examination without abnormal findings, Encounter for preventive health examination - 2017 Personal history of other diseases of the digestive system, History of esophageal reflux - 2016 Personal history of other diseases of the musculoskeletal system and connective tissue, History of arthritis - 2016 Personal history of other endocrine, nutritional and metabolic disease, History of hypercholesterolemia - 2016, History of diabetes mellitus, - 2016 Arthritis GERD Hypercholesterolemia Hypertension Type 1 diabetes mellitus without complications    FAMILY HISTORY: 1 Daughter - No Family History 1 son - No Family History Bone Cancer - Mother Breast Cancer - Mother Colon Cancer - Brother Deceased - Runs In Family Heart Attack - Brother ovarian cancer - Sister   SOCIAL HISTORY: Marital Status: Single Preferred Language: English; Race: White Current Smoking Status: Patient does not smoke anymore.   Tobacco Use Assessment Completed: Used Tobacco in last 30 days? Does not use smokeless tobacco. Has never drank.  Drinks 1 caffeinated drink per day. Has not had a blood transfusion.  REVIEW OF SYSTEMS:    GU Review Male:   Patient denies trouble starting your stream, leakage of urine, burning/ pain with urination, erection problems, stream starts and stops, get up at night to urinate, hard to postpone urination, frequent urination, have to strain to urinate , and penile pain.  Gastrointestinal (Upper):   Patient denies nausea, vomiting, and indigestion/ heartburn.  Gastrointestinal (Lower):   Patient denies diarrhea and constipation.  Constitutional:   Patient denies  fever, night sweats, weight loss, and fatigue.  Skin:   Patient denies skin rash/ lesion and itching.  Eyes:   Patient denies blurred vision and double vision.  Ears/ Nose/ Throat:   Patient denies sore throat and sinus problems.  Hematologic/Lymphatic:   Patient denies swollen glands and easy bruising.  Cardiovascular:   Patient denies leg swelling and chest pains.  Respiratory:   Patient denies cough and shortness of breath.  Endocrine:   Patient denies excessive thirst.  Musculoskeletal:   Patient reports back pain. Patient denies joint pain.  Neurological:   Patient reports dizziness. Patient denies headaches.  Psychologic:   Patient denies depression and anxiety.   VITAL SIGNS:      05/15/2020 11:57 AM  Weight 205 lb / 92.99 kg  Height 71 in / 180.34 cm  BP 142/76 mmHg  Heart Rate 56 /min  Temperature 98.4 F / 36.8 C  BMI 28.6 kg/m   GU PHYSICAL EXAMINATION:    Scrotum: No lesions. No edema. No cysts. No warts.  Epididymides: Right: no spermatocele, no masses, no cysts, no tenderness, no induration, no enlargement. Left: no spermatocele, no masses, no cysts, no tenderness, no induration, no enlargement.  Testes: No tenderness, no swelling, no enlargement left testes. No tenderness, no swelling, no enlargement right testes. Normal location left testes. Normal location right testes. No mass, no cyst, no varicocele, no hydrocele left testes. No mass, no cyst, no varicocele, no hydrocele right testes.  Urethral Meatus: Normal size. No lesion, no wart, no discharge, no polyp. Normal location.  Penis: Circumcised, no warts, no cracks. No dorsal Peyronie's plaques, no left corporal Peyronie's plaques, no right corporal Peyronie's plaques, no scarring, no warts. No balanitis, no meatal stenosis.   MULTI-SYSTEM PHYSICAL EXAMINATION:    Constitutional: Well-nourished. No physical deformities. Normally developed. Good grooming.  Neurologic / Psychiatric: Oriented to time, oriented to place,  oriented to person. No depression, no anxiety, no agitation.  Musculoskeletal: Normal gait and station of head and neck.     Complexity of Data:  Lab Test Review:   PSA  Records Review:   Pathology Reports, Previous Patient Records  X-Ray Review: C.T. Abdomen/Pelvis: Reviewed Films. Reviewed Report. Discussed With Patient.     11/19/19 06/03/19 07/08/15  PSA  Total PSA 8.80 ng/mL 7.19 ng/mL 5.70   Free PSA   2.27   % Free PSA   40    Notes:                     EXAM:  CT ABDOMEN AND PELVIS WITHOUT CONTRAST     TECHNIQUE:  Multidetector CT imaging of the abdomen and pelvis was performed  following the standard protocol without IV contrast.     COMPARISON: Abdominal sonogram from 2018     FINDINGS:  Lower chest: Lung bases are clear. No effusion. No consolidation.     Hepatobiliary: Noncontrast appearance of liver and gallbladder is  normal. No gross biliary duct distension.     Pancreas: Pancreas normal contour, no  peripancreatic inflammation.  No ductal dilation.     Spleen: Spleen normal in size and contour.     Adrenals/Urinary Tract: Adrenal glands are normal.     Mild perinephric stranding bilaterally, nonspecific finding. Urinary  bladder under distended with impression upon the bladder base from a  markedly enlarged prostate. No hydronephrosis. No nephrolithiasis.  No ureteral calculi.     Stomach/Bowel: Stomach under distended. Small bowel without dilation  or signs of inflammation. Normal appendix. Scattered colonic  diverticulosis without signs of diverticulitis     Vascular/Lymphatic: Atheromatous plaque of the abdominal aorta with  calcification. No aneurysmal dilation. No adenopathy in the  retroperitoneum. No adenopathy in the pelvis.     Reproductive: Markedly enlarged prostate, prostate measuring  approximately 7.4 x 6.0 x 6.4 cm. Mild periprosthetic stranding may  be present. Findings are nonspecific on CT.     Other: Mild stranding in the  subcutaneous fat of the anterior  abdominal wall in the midline with some skin thickening. No ascites.  Small fat containing umbilical hernia.     Musculoskeletal: Spinal degenerative changes. No acute or  destructive bone finding.     IMPRESSION:  1. Markedly enlarged prostate, prostate measuring approximately 7.4  x 6.0 x 6.4 cm. Mild periprosthetic stranding may be present.  Findings are nonspecific on CT. Correlate with any clinical or  laboratory evidence of prostatitis in the setting of lower abdominal  pain.  2. Mild stranding in the subcutaneous fat of the anterior abdominal  wall in the midline with some skin thickening. Findings could be  related to injection sites. Correlate with any signs of cellulitis.  3. No signs of nephrolithiasis or hydronephrosis.  4. Normal appendix.  5. Diverticulosis without diverticulitis.  6. Aortic atherosclerosis.     Aortic Atherosclerosis (ICD10-I70.0).        Electronically Signed  By: Zetta Bills M.D.  On: 03/27/2020 11:25   PROCEDURES:         Flexible Cystoscopy - 52000  Risks, benefits, and some of the potential complications of the procedure were discussed at length with the patient including infection, bleeding, voiding discomfort, urinary retention, fever, chills, sepsis, and others. All questions were answered. Informed consent was obtained. Antibiotic prophylaxis was given. Sterile technique and intraurethral analgesia were used.  Meatus:  Normal size. Normal location. Normal condition.  Urethra:  No strictures.  External Sphincter:  Normal.  Verumontanum:  Normal.  Prostate:  Obstructing. Enlarged median lobe. Severe hyperplasia.  Bladder Neck:  Non-obstructing.  Ureteral Orifices:  Normal location. Normal size. Normal shape. Effluxed clear urine.  Bladder:  No trabeculation. No tumors. Normal mucosa. No stones.      The lower urinary tract was carefully examined. The procedure was well-tolerated and without  complications. Antibiotic instructions were given. Instructions were given to call the office immediately for bloody urine, difficulty urinating, urinary retention, painful or frequent urination, fever, chills, nausea, vomiting or other illness. The patient stated that he understood these instructions and would comply with them.         Urinalysis w/Scope Dipstick Dipstick Cont'd Micro  Color: Yellow Bilirubin: Neg mg/dL WBC/hpf: 0 - 5/hpf  Appearance: Cloudy Ketones: Neg mg/dL RBC/hpf: 0 - 2/hpf  Specific Gravity: 1.015 Blood: 1+ ery/uL Bacteria: NS (Not Seen)  pH: <=5.0 Protein: Neg mg/dL Cystals: Amorph Urates  Glucose: Neg mg/dL Urobilinogen: 0.2 mg/dL Casts: NS (Not Seen)    Nitrites: Neg Trichomonas: Not Present    Leukocyte Esterase: Trace leu/uL Mucous: Not Present  Epithelial Cells: 0 - 5/hpf      Yeast: NS (Not Seen)      Sperm: Not Present    ASSESSMENT:      ICD-10 Details  1 GU:   BPH w/LUTS - N40.1   2   Urinary Retention - R33.8   3   Elevated PSA - R97.20    PLAN:            Medications New Meds: Cipro 500 mg tablet 1 tablet PO BID   #6  0 Refill(s)            Orders Labs Urine Culture CATH          Schedule Return Visit/Planned Activity: Next Available Appointment - Schedule Surgery          Document Letter(s):  Created for Patient: Clinical Summary   Created for Abelino Derrick         Notes:   -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  -The risks, benefits and alternatives of cystoscopy with TURP was discussed with the patient. The risks included, but are not limited to, bleeding, urinary tract infection, bladder perforation requiring prolonged catheterization and/or open bladder repair, ureteral injury, ureteral obstruction, urethral stricture disease, new or worsening voiding dysfunction, retrograde ejaculation, MI, CVA, PE, DVT and the inherent risks of general  anesthesia. We also discussed the need for Foley catheterization for at least 3 days post-op and the likely need for post-op observation in the hospital following the procedure. The patient voices understanding and wishes to proceed.

## 2020-06-04 NOTE — Anesthesia Preprocedure Evaluation (Signed)
Anesthesia Evaluation  Patient identified by MRN, date of birth, ID band Patient awake    Reviewed: Allergy & Precautions, NPO status , Patient's Chart, lab work & pertinent test results  Airway Mallampati: I       Dental no notable dental hx.    Pulmonary former smoker,    Pulmonary exam normal        Cardiovascular hypertension, Pt. on medications and Pt. on home beta blockers + CAD  Normal cardiovascular exam     Neuro/Psych  Headaches, negative psych ROS   GI/Hepatic   Endo/Other  diabetes, Type 2, Insulin Dependent, Oral Hypoglycemic Agents  Renal/GU      Musculoskeletal   Abdominal Normal abdominal exam  (+)   Peds  Hematology  (+) anemia ,   Anesthesia Other Findings   The left ventricular systolic function is normal.  LV end diastolic pressure is mildly elevated.  The left ventricular ejection fraction is 55-65% by visual estimate.  Prox RCA lesion is 20% stenosed.  Dist RCA lesion is 40% stenosed.  Dist LM lesion is 20% stenosed.  Mid LAD lesion is 40% stenosed.   1.  Mild to moderate nonobstructive coronary artery disease.  Mildly calcified vessels overall. 2.  Normal LV systolic function mildly elevated left ventricular end-diastolic pressure.  Recommendations: Continue aggressive medical therapy and treatment of risk factors.    Reproductive/Obstetrics                             Anesthesia Physical Anesthesia Plan  ASA: III  Anesthesia Plan: General   Post-op Pain Management:    Induction: Intravenous  PONV Risk Score and Plan: 4 or greater and Ondansetron, Dexamethasone and Midazolam  Airway Management Planned: LMA  Additional Equipment: None  Intra-op Plan:   Post-operative Plan: Extubation in OR  Informed Consent: I have reviewed the patients History and Physical, chart, labs and discussed the procedure including the risks, benefits and  alternatives for the proposed anesthesia with the patient or authorized representative who has indicated his/her understanding and acceptance.     Dental advisory given  Plan Discussed with: CRNA  Anesthesia Plan Comments:         Anesthesia Quick Evaluation

## 2020-06-05 ENCOUNTER — Telehealth: Payer: Self-pay

## 2020-06-05 ENCOUNTER — Encounter (HOSPITAL_BASED_OUTPATIENT_CLINIC_OR_DEPARTMENT_OTHER): Payer: Self-pay | Admitting: Urology

## 2020-06-05 DIAGNOSIS — Z7984 Long term (current) use of oral hypoglycemic drugs: Secondary | ICD-10-CM | POA: Diagnosis not present

## 2020-06-05 DIAGNOSIS — N401 Enlarged prostate with lower urinary tract symptoms: Secondary | ICD-10-CM | POA: Diagnosis not present

## 2020-06-05 DIAGNOSIS — I10 Essential (primary) hypertension: Secondary | ICD-10-CM | POA: Diagnosis not present

## 2020-06-05 DIAGNOSIS — Z794 Long term (current) use of insulin: Secondary | ICD-10-CM | POA: Diagnosis not present

## 2020-06-05 DIAGNOSIS — Z79899 Other long term (current) drug therapy: Secondary | ICD-10-CM | POA: Diagnosis not present

## 2020-06-05 DIAGNOSIS — E109 Type 1 diabetes mellitus without complications: Secondary | ICD-10-CM | POA: Diagnosis not present

## 2020-06-05 LAB — CBC
HCT: 30.4 % — ABNORMAL LOW (ref 39.0–52.0)
Hemoglobin: 10.1 g/dL — ABNORMAL LOW (ref 13.0–17.0)
MCH: 29.1 pg (ref 26.0–34.0)
MCHC: 33.2 g/dL (ref 30.0–36.0)
MCV: 87.6 fL (ref 80.0–100.0)
Platelets: 199 10*3/uL (ref 150–400)
RBC: 3.47 MIL/uL — ABNORMAL LOW (ref 4.22–5.81)
RDW: 12.9 % (ref 11.5–15.5)
WBC: 8.5 10*3/uL (ref 4.0–10.5)
nRBC: 0 % (ref 0.0–0.2)

## 2020-06-05 LAB — BASIC METABOLIC PANEL
Anion gap: 8 (ref 5–15)
BUN: 20 mg/dL (ref 8–23)
CO2: 22 mmol/L (ref 22–32)
Calcium: 8.4 mg/dL — ABNORMAL LOW (ref 8.9–10.3)
Chloride: 105 mmol/L (ref 98–111)
Creatinine, Ser: 0.82 mg/dL (ref 0.61–1.24)
GFR, Estimated: >60 mL/min (ref >60)
Glucose, Bld: 197 mg/dL — ABNORMAL HIGH (ref 70–99)
Potassium: 4.4 mmol/L (ref 3.5–5.1)
Sodium: 135 mmol/L (ref 135–145)

## 2020-06-05 LAB — SURGICAL PATHOLOGY

## 2020-06-05 LAB — GLUCOSE, CAPILLARY: Glucose-Capillary: 184 mg/dL — ABNORMAL HIGH (ref 70–99)

## 2020-06-05 MED ORDER — INSULIN ASPART 100 UNIT/ML ~~LOC~~ SOLN
SUBCUTANEOUS | Status: AC
  Filled 2020-06-05: qty 1

## 2020-06-05 NOTE — Progress Notes (Signed)
Spoke with patient daughter per clinical pharmacist to make sure the patient  is giving himself his insulin 30-45 minutes prior to meals? If he waits until he starts the meal or after the meal he could be putting himself at a higher risk of hypoglycemia   Patient daughter confirms the patient is giving hisself his insulin 30-45 minutes prior to his meals. Patient daughter verbalized understanding.  [facility] Pharmacist Assistant [phone]

## 2020-06-05 NOTE — Telephone Encounter (Signed)
-----   Message from Germaine Pomfret, [facility] sent at 06/05/2020  9:57 AM EST ----- Marnette Burgess, Can you please reach back out to Mr. Counterman and make sure that he is giving himself his insulin 30-45 minutes prior to meals? If he waits until he starts the meal or after the meal he could be putting himself at a higher risk of hypoglycemia.

## 2020-06-05 NOTE — Discharge Summary (Signed)
Date of admission: 06/04/2020  Date of discharge: 06/05/2020  Admission diagnosis: BPH with urinary retention  Discharge diagnosis: BPH with urinary retention  Procedures: Bipolar TURP  History and Physical: For full details, please see admission history and physical. Briefly, Scott Donaldson is a 69 y.o. year old patient with BPH with ongoing urinary retention.   Hospital Course: routine post-op course following TURP  Physical Exam:  General: Alert and oriented CV: RRR, palpable distal pulses Lungs: CTAB, equal chest rise Abdomen: Soft, NTND, no rebound or guarding GU:  Foley draining light pink urine w/o CBI Ext: NT, No erythema  Laboratory values: Recent Labs    06/02/20 1320 06/05/20 0530  HGB 11.7* 10.1*  HCT 34.9* 30.4*   Recent Labs    06/02/20 1320 06/05/20 0530  CREATININE 0.91 0.82    Disposition: Home  Discharge instruction: The patient was instructed to be ambulatory but told to refrain from heavy lifting, strenuous activity, or driving.  Discharge medications:  Allergies as of 06/05/2020   No Known Allergies     Medication List    STOP taking these medications   aspirin EC 81 MG tablet     TAKE these medications   atorvastatin 40 MG tablet Commonly known as: LIPITOR Take 1 tablet (40 mg total) by mouth daily. What changed: when to take this   B-12 500 MCG Tabs Take one daily.   calcium carbonate 500 MG chewable tablet Commonly known as: TUMS - dosed in mg elemental calcium Chew 1 tablet by mouth as needed for indigestion or heartburn.   cephALEXin 500 MG capsule Commonly known as: KEFLEX Take 1 capsule (500 mg total) by mouth 2 (two) times daily for 3 days.   diclofenac Sodium 1 % Gel Commonly known as: Voltaren Apply a small grape sized dollop to tender space of right knee up to 4 times daily. What changed:   how much to take  how to take this  when to take this  reasons to take this   gabapentin 300 MG capsule Commonly  known as: NEURONTIN Take one at night for one week and then increase to one twice daily. What changed:   how much to take  how to take this  when to take this  additional instructions   glucose blood test strip USE TO CHECK BLOOD SUGAR TWICE DAILY   insulin NPH-regular Human (70-30) 100 UNIT/ML injection Inject 36 Units into the skin 2 (two) times daily with a meal.   Iron (Ferrous Sulfate) 325 (65 Fe) MG Tabs Take one daily   ketoconazole 2 % cream Commonly known as: NIZORAL Apply to glans once daily What changed:   when to take this  reasons to take this  additional instructions   metFORMIN 500 MG 24 hr tablet Commonly known as: GLUCOPHAGE-XR Take 2 tablets (1,000 mg total) by mouth 2 (two) times daily.   metoprolol succinate 50 MG 24 hr tablet Commonly known as: TOPROL-XL Take 50 mg by mouth at bedtime. Take with or immediately following a meal.   olmesartan 20 MG tablet Commonly known as: BENICAR Take 1 tablet (20 mg total) by mouth daily. TAKE 1 TABLET(20 MG) BY MOUTH DAILY What changed: when to take this   oxybutynin 5 MG tablet Commonly known as: DITROPAN Take 1 tablet (5 mg total) by mouth every 8 (eight) hours as needed for bladder spasms.   tamsulosin 0.4 MG Caps capsule Commonly known as: FLOMAX Take 0.4 mg by mouth in the morning and at  bedtime.   traMADol 50 MG tablet Commonly known as: Ultram Take 1 tablet (50 mg total) by mouth every 6 (six) hours as needed for up to 3 days.       Followup:   Follow-up Information    [facility] On 06/09/2020.   Why: Catheter removal at 8:45 AM Contact information: [address] [place] [phone]

## 2020-06-06 NOTE — Anesthesia Postprocedure Evaluation (Signed)
Anesthesia Post Note  Patient: Technical sales engineer  Procedure(s) Performed: TRANSURETHRAL RESECTION OF THE PROSTATE (TURP), BIPOLAR (N/A Prostate)     Patient location during evaluation: PACU Anesthesia Type: General Level of consciousness: awake Pain management: pain level controlled Vital Signs Assessment: post-procedure vital signs reviewed and stable Respiratory status: spontaneous breathing Cardiovascular status: stable Postop Assessment: no apparent nausea or vomiting Anesthetic complications: no   No complications documented.  Last Vitals:  Vitals:   06/05/20 0558 06/05/20 0845  BP: (!) 141/55 (!) (P) 155/55  Pulse: (!) 57 (P) 71  Resp: 18 (P) 18  Temp: (!) 36.2 C (P) 36.7 C  SpO2: 98% (P) 98%    Last Pain:  Vitals:   06/05/20 0845  TempSrc:   PainSc: (P) 0-No pain   Pain Goal: Patients Stated Pain Goal: 6 (06/04/20 8478)                 Huston Foley

## 2020-07-06 ENCOUNTER — Other Ambulatory Visit: Payer: Self-pay | Admitting: Family Medicine

## 2020-07-06 DIAGNOSIS — E1142 Type 2 diabetes mellitus with diabetic polyneuropathy: Secondary | ICD-10-CM

## 2020-07-06 DIAGNOSIS — I10 Essential (primary) hypertension: Secondary | ICD-10-CM

## 2020-07-07 ENCOUNTER — Telehealth: Payer: Medicare Other

## 2020-07-29 ENCOUNTER — Other Ambulatory Visit: Payer: Self-pay

## 2020-07-29 ENCOUNTER — Encounter: Payer: Self-pay | Admitting: Endocrinology

## 2020-07-29 ENCOUNTER — Ambulatory Visit: Payer: Medicare Other | Admitting: Endocrinology

## 2020-07-29 VITALS — BP 164/86 | HR 96 | Ht 72.0 in | Wt 200.0 lb

## 2020-07-29 DIAGNOSIS — E1142 Type 2 diabetes mellitus with diabetic polyneuropathy: Secondary | ICD-10-CM | POA: Diagnosis not present

## 2020-07-29 DIAGNOSIS — Z794 Long term (current) use of insulin: Secondary | ICD-10-CM | POA: Diagnosis not present

## 2020-07-29 LAB — POCT GLYCOSYLATED HEMOGLOBIN (HGB A1C): Hemoglobin A1C: 8 % — AB (ref 4.0–5.6)

## 2020-07-29 MED ORDER — INSULIN NPH ISOPHANE & REGULAR (70-30) 100 UNIT/ML ~~LOC~~ SUSP: 36.0000 [IU] | Freq: Two times a day (BID) | SUBCUTANEOUS | 11 refills | Status: DC

## 2020-07-29 NOTE — Progress Notes (Signed)
Subjective:    Patient ID: Scott Donaldson, male    DOB: 1951-01-30, 70 y.o.   MRN: [id number]  HPI Pt returns for f/u of diabetes mellitus:  DM type: insulin-requiring type 2.   Dx'ed:1990 Complications: polyneuropathy and foot ulcer.  Therapy: insulin since 2010.   DKA: never.  Severe hypoglycemia: never.  Pancreatitis: never.   Other: he needs a simple insulin regimen, due to ongoing noncompliance; ins declined tresiba; he takes human insulin, due to cost; he was ref DM educ, to review inject technique, but he did not go.   Interval history: pt says cbg meter no longer works, but he takes meds as rx'ed.  pt states he feels well in general.   Past Medical History:  Diagnosis Date  . Abdominal pain 08/04/2016  . Abnormal PSA 05/19/2015  . Anemia   . Arthralgia 11/12/2010  . Benign prostatic hyperplasia with urinary hesitancy 06/14/2018  . Cataract   . Coronary artery disease    nonobstructive  . Diabetes (HCC) 03/04/2007   Qualifier: Diagnosis of  By: Everardo All MD, Cleophas Dunker   . DIABETES MELLITUS, TYPE II 03/04/2007  . Elevated LDL cholesterol level 06/14/2018  . Foot ulcer, right (HCC) 04/15/2014  . GERD (gastroesophageal reflux disease)   . HOH (hard of hearing)    both ears no hearing aides  . HYPERCHOLESTEROLEMIA 07/29/2008  . HYPERTENSION 03/04/2007  . Knee pain, left 05/16/2015  . Nonintractable episodic headache 12/01/2016   migraine  . SHOULDER PAIN, BILATERAL 01/12/2010   Qualifier: Diagnosis of  By: Everardo All MD, Keyra Virella A   . 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 COLONOSCOPY  07/2017  . LEFT HEART CATH AND CORONARY ANGIOGRAPHY N/A 07/12/2018   Procedure: LEFT HEART CATH AND CORONARY ANGIOGRAPHY;  Surgeon: Iran Ouch, MD;  Location: [facility];  Service: Cardiovascular;  Laterality: N/A;  . stress cardiolite  05/30/2003  . TRANSURETHRAL RESECTION OF  PROSTATE N/A 06/04/2020   Procedure: TRANSURETHRAL RESECTION OF THE PROSTATE (TURP), BIPOLAR;  Surgeon: Rene Paci, MD;  Location: [facility];  Service: Urology;  Laterality: N/A;    Social History   Socioeconomic History  . Marital status: Single    Spouse name: Not on file  . Number of children: 2  . Years of education: 10  . Highest education level: Not on file  Occupational History  . Occupation: roofer  Tobacco Use  . Smoking status: Former Smoker    Packs/day: 1.50    Years: 25.00    Pack years: 37.50    Types: Cigarettes    Quit date: 07/26/1988    Years since quitting: 32.0  . Smokeless tobacco: Never Used  Vaping Use  . Vaping Use: Never used  Substance and Sexual Activity  . Alcohol use: No  . Drug use: No  . Sexual activity: Not on file  Other Topics Concern  . Not on file  Social History Narrative   Lives with daughter in a one story home.  Has 2 children.  Semi-retired.  Works as a Designer, fashion/clothing.  Education: 10th grade.    Social Determinants of Health   Financial Resource Strain: Not on file  Food Insecurity: Not on file  Transportation Needs: Not on file  Physical Activity: Not on file  Stress: Not on file  Social Connections: Not on file  Intimate Partner Violence: Not  on file    Current Outpatient Medications on File Prior to Visit  Medication Sig Dispense Refill  . atorvastatin (LIPITOR) 40 MG tablet Take 1 tablet (40 mg total) by mouth daily. (Patient taking differently: Take 40 mg by mouth at bedtime.) 90 tablet 3  . calcium carbonate (TUMS - DOSED IN MG ELEMENTAL CALCIUM) 500 MG chewable tablet Chew 1 tablet by mouth as needed for indigestion or heartburn.    . Cyanocobalamin (B-12) 500 MCG TABS Take one daily. 90 tablet 4  . diclofenac Sodium (VOLTAREN) 1 % GEL Apply a small grape sized dollop to tender space of right knee up to 4 times daily. (Patient taking differently: Apply 2 g topically 4 (four) times daily as needed  (right knee pain). Apply a small grape sized dollop to tender space of right knee up to 4 times daily.) 150 g 1  . gabapentin (NEURONTIN) 300 MG capsule Take one at night for one week and then increase to one twice daily. (Patient taking differently: Take 300 mg by mouth at bedtime.) 60 capsule 3  . glucose blood test strip USE TO CHECK BLOOD SUGAR TWICE DAILY 100 each 12  . Iron, Ferrous Sulfate, 325 (65 Fe) MG TABS Take one daily 90 tablet 4  . ketoconazole (NIZORAL) 2 % cream Apply to glans once daily (Patient taking differently: 2 (two) times daily as needed.) 15 g 0  . metFORMIN (GLUCOPHAGE-XR) 500 MG 24 hr tablet Take 2 tablets (1,000 mg total) by mouth 2 (two) times daily. 360 tablet 3  . metoprolol succinate (TOPROL-XL) 50 MG 24 hr tablet Take 50 mg by mouth at bedtime. Take with or immediately following a meal.    . olmesartan (BENICAR) 20 MG tablet Take 1 tablet (20 mg total) by mouth at bedtime. TAKE 1 TABLET(20 MG) BY MOUTH DAILY 90 tablet 0  . oxybutynin (DITROPAN) 5 MG tablet Take 1 tablet (5 mg total) by mouth every 8 (eight) hours as needed for bladder spasms. 30 tablet 1  . tamsulosin (FLOMAX) 0.4 MG CAPS capsule Take 0.4 mg by mouth in the morning and at bedtime.      No current facility-administered medications on file prior to visit.    No Known Allergies  Family History  Problem Relation Age of Onset  . Cancer Mother        Breast Cancer  . Cancer Father        uncertain type  . Diabetes Father   . Cancer Brother 28       Colon Cancer  . Colon cancer Neg Hx   . Rectal cancer Neg Hx   . Stomach cancer Neg Hx     BP (!) 164/86   Pulse 96   Ht 6' (1.829 m)   Wt 200 lb (90.7 kg)   SpO2 99%   BMI 27.12 kg/m    Review of Systems     Objective:   Physical Exam VITAL SIGNS:  See vs page GENERAL: no distress Pulses: dorsalis pedis intact bilat.   MSK: no deformity of the feet CV: no leg edema Skin:  no ulcer on the feet.  normal color and temp on the  feet. Neuro: sensation is intact to touch on the feet, but decreased from normal.  Ext: There is bilateral onychomycosis of the toenails.   A1c=8.0%     Assessment & Plan:  Insulin-requiring type 2 DM, with foot ulcer: uncontrolled.  HTN: is noted today.   Patient Instructions  Your blood pressure  is high today.  Please see your primary care provider soon, to have it rechecked Please continue the same medications.   Here is a new meter.  We need to increase your insulin, but in order to to do so safely, we need your blood sugar information: check your blood sugar twice a day.  vary the time of day when you check, between before the 3 meals, and at bedtime.  also check if you have symptoms of your blood sugar being too high or too low.  please keep a record of the readings and bring it to your next appointment here (or you can bring the meter itself).  You can write it on any piece of paper.  please call us sooner if your blood sugar goes below 70, or if you have a lot of readings over 200. Please come back for a follow-up appointment in 2 months.

## 2020-07-29 NOTE — Patient Instructions (Addendum)
Your blood pressure is high today.  Please see your primary care provider soon, to have it rechecked Please continue the same medications.   Here is a new meter.  We need to increase your insulin, but in order to to do so safely, we need your blood sugar information: check your blood sugar twice a day.  vary the time of day when you check, between before the 3 meals, and at bedtime.  also check if you have symptoms of your blood sugar being too high or too low.  please keep a record of the readings and bring it to your next appointment here (or you can bring the meter itself).  You can write it on any piece of paper.  please call us sooner if your blood sugar goes below 70, or if you have a lot of readings over 200. Please come back for a follow-up appointment in 2 months.

## 2020-07-30 ENCOUNTER — Telehealth: Payer: Self-pay

## 2020-07-30 NOTE — Progress Notes (Addendum)
Chronic Care Management Pharmacy Assistant   Name: Scott Donaldson  MRN: [id number] DOB: 27-Dec-1950  Reason for Encounter:Hypertension,Hyperlipidema and Diabetes Disease State Call.   PCP : Mliss SaxKremer, William Alfred, MD  Allergies:  No Known Allergies  Medications: Outpatient Encounter Medications as of 07/30/2020  Medication Sig   atorvastatin (LIPITOR) 40 MG tablet Take 1 tablet (40 mg total) by mouth daily. (Patient taking differently: Take 40 mg by mouth at bedtime.)   calcium carbonate (TUMS - DOSED IN MG ELEMENTAL CALCIUM) 500 MG chewable tablet Chew 1 tablet by mouth as needed for indigestion or heartburn.   Cyanocobalamin (B-12) 500 MCG TABS Take one daily.   diclofenac Sodium (VOLTAREN) 1 % GEL Apply a small grape sized dollop to tender space of right knee up to 4 times daily. (Patient taking differently: Apply 2 g topically 4 (four) times daily as needed (right knee pain). Apply a small grape sized dollop to tender space of right knee up to 4 times daily.)   gabapentin (NEURONTIN) 300 MG capsule Take one at night for one week and then increase to one twice daily. (Patient taking differently: Take 300 mg by mouth at bedtime.)   glucose blood test strip USE TO CHECK BLOOD SUGAR TWICE DAILY   insulin NPH-regular Human (70-30) 100 UNIT/ML injection Inject 36 Units into the skin 2 (two) times daily with a meal.   Iron, Ferrous Sulfate, 325 (65 Fe) MG TABS Take one daily   ketoconazole (NIZORAL) 2 % cream Apply to glans once daily (Patient taking differently: 2 (two) times daily as needed.)   metFORMIN (GLUCOPHAGE-XR) 500 MG 24 hr tablet Take 2 tablets (1,000 mg total) by mouth 2 (two) times daily.   metoprolol succinate (TOPROL-XL) 50 MG 24 hr tablet Take 50 mg by mouth at bedtime. Take with or immediately following a meal.   olmesartan (BENICAR) 20 MG tablet Take 1 tablet (20 mg total) by mouth at bedtime. TAKE 1 TABLET(20 MG) BY MOUTH DAILY   oxybutynin (DITROPAN) 5 MG tablet Take  1 tablet (5 mg total) by mouth every 8 (eight) hours as needed for bladder spasms.   tamsulosin (FLOMAX) 0.4 MG CAPS capsule Take 0.4 mg by mouth in the morning and at bedtime.    No facility-administered encounter medications on file as of 07/30/2020.    Current Diagnosis: Patient Active Problem List   Diagnosis Date Noted   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   Iron deficiency 04/18/2020   B12 deficiency 04/18/2020   Anemia 04/17/2020   Urinary retention 04/17/2020   Balanitis 04/04/2020   Hospital discharge follow-up 04/04/2020   Prostatitis 03/28/2020   Hypotension due to hypovolemia 03/27/2020   Hyperkalemia 03/27/2020   Acute kidney injury (HCC) 03/27/2020   Right knee pain 03/07/2020   Acute idiopathic gout of right knee 02/25/2020   Bilateral carotid bruits 12/05/2019   Type 2 diabetes mellitus with diabetic polyneuropathy, with long-term current use of insulin (HCC) 09/05/2019   Diabetes mellitus (HCC) 09/05/2019   Cervical radiculopathy 02/12/2019   Constipation 02/12/2019   DOE (dyspnea on exertion) 02/12/2019   Chest pain 02/12/2019   No-show for appointment 11/21/2018   Coronary artery disease involving native heart 07/20/2018   Bronchitis 07/20/2018   Abnormal stress test    Benign prostatic hyperplasia with urinary hesitancy 06/14/2018   Elevated LDL cholesterol level 06/14/2018   Swelling of left foot 08/05/2017   Nonintractable episodic headache 12/01/2016   Cough 08/04/2016   Lower abdominal pain 08/04/2016  Abnormal PSA 05/19/2015   OA (osteoarthritis) of knee 05/16/2015   Foot ulcer, right (HCC) 04/15/2014   Loss of vision 04/15/2014   Screening for prostate cancer 11/03/2012   Routine general medical examination at a health care facility 11/03/2012   Arthralgia 11/12/2010   SHOULDER PAIN, BILATERAL 01/12/2010   TUBULOVILLOUS ADENOMA, 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   HYPERCHOLESTEROLEMIA 07/29/2008   CELLULITIS, FOOT, RIGHT 07/02/2008    EDEMA 07/02/2008   Type 2 diabetes mellitus with hyperglycemia, with long-term current use of insulin (HCC) 03/04/2007   Essential hypertension 03/04/2007    Goals Addressed   None    Recent Relevant Labs: Lab Results  Component Value Date/Time   HGBA1C 8.0 (A) 07/29/2020 08:25 AM   HGBA1C 8.4 (H) 03/27/2020 11:15 PM   HGBA1C 9.3 (H) 12/05/2019 11:17 AM   MICROALBUR 40.5 (H) 12/05/2019 11:17 AM   MICROALBUR 37.8 (H) 07/23/2019 08:59 AM    Kidney Function Lab Results  Component Value Date/Time   CREATININE 0.82 06/05/2020 05:30 AM   CREATININE 0.91 06/02/2020 01:20 PM   GFR 95.59 04/17/2020 10:01 AM   GFRNONAA >60 06/05/2020 05:30 AM   GFRAA >60 04/06/2020 10:02 PM    Current antihyperglycemic regimen:  Insulin NPH- regular (70/30)- 36 units twice daily  Metformin 500MG  XR 2 tablets twice daily   What recent interventions/DTPs have been made to improve glycemic control:  None ID  Have there been any recent hospitalizations or ED visits since last visit with CPP? No Patient denies hypoglycemic symptoms, including Pale, Sweaty, Shaky, Hungry, Nervous/irritable and Vision changes Patient denies hyperglycemic symptoms, including blurry vision, excessive thirst, fatigue, polyuria and weakness How often are you checking your blood sugar? once daily What are your blood sugars ranging?  Patient daughter states his blood sugar can range around 102-145. Fasting: N/A Before meals: N/A After meals: N/A Bedtime: N/A During the week, how often does your blood glucose drop below 70? Never Are you checking your feet daily/regularly?   Patient daughter states patient denies numbness, pain or tingling sensation in his feet. Adherence Review: Is the patient currently on a STATIN medication? Yes Is the patient currently on ACE/ARB medication? Yes Does the patient have >5 day gap between last estimated fill dates? Yes    Reviewed chart prior to disease state call.  Spoke with patient regarding BP  Recent Office Vitals: BP Readings from Last 3 Encounters:  07/29/20 (!) 164/86  06/05/20 (!) (P) 155/55  06/02/20 (!) 151/58   Pulse Readings from Last 3 Encounters:  07/29/20 96  06/05/20 (P) 71  06/02/20 77    Wt Readings from Last 3 Encounters:  07/29/20 200 lb (90.7 kg)  06/04/20 189 lb 1.6 oz (85.8 kg)  06/02/20 204 lb (92.5 kg)     Kidney Function Lab Results  Component Value Date/Time   CREATININE 0.82 06/05/2020 05:30 AM   CREATININE 0.91 06/02/2020 01:20 PM   GFR 95.59 04/17/2020 10:01 AM   GFRNONAA >60 06/05/2020 05:30 AM   GFRAA >60 04/06/2020 10:02 PM    BMP Latest Ref Rng & Units 06/05/2020 06/02/2020 04/17/2020  Glucose 70 - 99 mg/dL 04/19/2020) 841(L) 244(W)  BUN 8 - 23 mg/dL 20 18 14   Creatinine 0.61 - 1.24 mg/dL 102(V 2.53  BUN/Creat Ratio 10 - 24 - - -  Sodium 135 - 145 mmol/L 135 137 140  Potassium 3.5 - 5.1 mmol/L 4.4 4.3 4.2  Chloride 98 - 111 mmol/L 105 105 104  CO2 22 - 32 mmol/L  22 22 27   Calcium 8.9 - 10.3 mg/dL 8.4(L) 9.1 9.1    Current antihypertensive regimen:  Olmesartan 20 mg  Metoprolol 50 mg  How often are you checking your Blood Pressure? 3-5x per week Current home BP readings:  Patient daughter states patient blood pressure has been high lately, ranging around 168/90's.Patient daughter states patient denies headaches, lightheadedness and dizziness. Patient daughter reports patient cuff is on his left upper arm ,he is  sitting down with feet flat on the floor without talking while checking his blood pressure.  I informed patient daughter if she could keep a blood pressure log of his readings and I the CPA would check  back in 2-3 weeks.  Patient daughter agreed to start keeping a blood pressure log.Patient daughter agreed with the CPA calling in 2-3 weeks to follow up. What recent interventions/DTPs have been made by any provider to improve Blood Pressure control since last CPP Visit: None ID Any recent  hospitalizations or ED visits since last visit with CPP? No What diet changes have been made to improve Blood Pressure Control?  Patient daughter reports she cooks at home without adding salt, but the patient adds salt at the table for taste.  What exercise is being done to improve your Blood Pressure Control?  Patient daughter reports patient does not exercise.   Adherence Review: Is the patient currently on ACE/ARB medication? Yes Does the patient have >5 day gap between last estimated fill dates? Yes   Maryjean Ka  07/30/2020 Name: GLENDEN DANBURY MRN: [id number] DOB: [birth year] [name] is a 70 y.o. year old male who is a primary care patient of [name], MD.  Comprehensive medication review performed; Spoke to patient regarding cholesterol  Lipid Panel    Component Value Date/Time   CHOL 198 07/23/2019 0859   TRIG 191.0 (H) 07/23/2019 0859   HDL 42.60 07/23/2019 0859   LDLCALC 117 (H) 07/23/2019 0859   LDLDIRECT 79.0 12/05/2019 1117    10-year ASCVD risk score: The 10-year ASCVD risk score Mikey Bussing DC Brooke Bonito., et al., 2013) is: 53.1%   Values used to calculate the score:     Age: 20 years     Sex: Male     Is Non-Hispanic African American: No     Diabetic: Yes     Tobacco smoker: No     Systolic Blood Pressure: 123456 mmHg     Is BP treated: Yes     HDL Cholesterol: 42.6 mg/dL     Total Cholesterol: 198 mg/dL  Current antihyperlipidemic regimen:  Atorvastatin 40 mg  Previous antihyperlipidemic medications tried: None ID ASCVD risk enhancing conditions: DM and HTN What recent interventions/DTPs have been made by any provider to improve Cholesterol control since last CPP Visit: None ID Any recent hospitalizations or ED visits since last visit with CPP? No What diet changes have been made to improve Cholesterol?  Patient daughter reports she cooks at home without adding salt, but the patient adds salt at the table for taste.  What exercise is being  done to improve Cholesterol?  Patient daughter reports patient does not exercise.    Adherence Review: Does the patient have >5 day gap between last estimated fill dates? Yes   Maryjean Ka  Follow-Up:  Pharmacist Review   Anderson Malta Clinical Pharmacist Assistant [phone]   14 minutes spent in review, coordination, and documentation. Doristine Section Clinical Pharmacist [facility]  [phone]

## 2020-08-04 ENCOUNTER — Other Ambulatory Visit: Payer: Self-pay | Admitting: Family Medicine

## 2020-08-04 DIAGNOSIS — I2510 Atherosclerotic heart disease of native coronary artery without angina pectoris: Secondary | ICD-10-CM

## 2020-08-04 DIAGNOSIS — I10 Essential (primary) hypertension: Secondary | ICD-10-CM

## 2020-08-05 ENCOUNTER — Other Ambulatory Visit: Payer: Self-pay | Admitting: Family

## 2020-08-05 ENCOUNTER — Other Ambulatory Visit: Payer: Self-pay

## 2020-08-05 ENCOUNTER — Encounter: Payer: Self-pay | Admitting: Family Medicine

## 2020-08-05 ENCOUNTER — Ambulatory Visit: Payer: Medicare Other | Admitting: Nurse Practitioner

## 2020-08-05 MED ORDER — METOPROLOL SUCCINATE ER 50 MG PO TB24
50.0000 mg | ORAL_TABLET | Freq: Every day | ORAL | 0 refills | Status: DC
Start: 2020-08-05 — End: 2020-08-05

## 2020-08-05 MED ORDER — METOPROLOL SUCCINATE ER 50 MG PO TB24
50.0000 mg | ORAL_TABLET | Freq: Every day | ORAL | 0 refills | Status: DC
Start: 2020-08-05 — End: 2020-08-06

## 2020-08-05 NOTE — Telephone Encounter (Signed)
Refill request for above medication called patient to see if he was still taking this due to medication being discontinued by Dr. Ethelene Hal. Per patients daughter/caregiver patient was taken off the medication while dealing with prostate issues and surgery but was put back on by Dr. Ethelene Hal and have been taking for few months now. Patient and daughter not sure if he should still be on this or not BP still elevated sometimes. Please advise or will patient need to come in for OV?

## 2020-08-05 NOTE — Telephone Encounter (Signed)
Per pharmacist and patients daughter patient taking 50mg  this was the last Rx picked up in October 2021

## 2020-08-06 ENCOUNTER — Other Ambulatory Visit: Payer: Self-pay | Admitting: Family

## 2020-08-06 MED ORDER — METOPROLOL SUCCINATE ER 50 MG PO TB24
50.0000 mg | ORAL_TABLET | Freq: Every day | ORAL | 0 refills | Status: DC
Start: 2020-08-06 — End: 2020-12-01

## 2020-08-29 ENCOUNTER — Telehealth: Payer: Self-pay

## 2020-08-29 NOTE — Progress Notes (Signed)
Chronic Care Management Pharmacy Assistant   Name: Scott Donaldson  MRN: [id number] DOB: 31-Dec-1950  Reason for Encounter:Diabetes Disease State  Call. Patient Questions:  1.  Have you seen any other providers since your last visit? No  2.  Any changes in your medicines or health? No   PCP : [name], MD  Allergies:  No Known Allergies  Medications: Outpatient Encounter Medications as of 08/29/2020  Medication Sig  . atorvastatin (LIPITOR) 40 MG tablet Take 1 tablet (40 mg total) by mouth daily. (Patient taking differently: Take 40 mg by mouth at bedtime.)  . calcium carbonate (TUMS - DOSED IN MG ELEMENTAL CALCIUM) 500 MG chewable tablet Chew 1 tablet by mouth as needed for indigestion or heartburn.  . Cyanocobalamin (B-12) 500 MCG TABS Take one daily.  . diclofenac Sodium (VOLTAREN) 1 % GEL Apply a small grape sized dollop to tender space of right knee up to 4 times daily. (Patient taking differently: Apply 2 g topically 4 (four) times daily as needed (right knee pain). Apply a small grape sized dollop to tender space of right knee up to 4 times daily.)  . gabapentin (NEURONTIN) 300 MG capsule Take one at night for one week and then increase to one twice daily. (Patient taking differently: Take 300 mg by mouth at bedtime.)  . glucose blood test strip USE TO CHECK BLOOD SUGAR TWICE DAILY  . insulin NPH-regular Human (70-30) 100 UNIT/ML injection Inject 36 Units into the skin 2 (two) times daily with a meal.  . Iron, Ferrous Sulfate, 325 (65 Fe) MG TABS Take one daily  . ketoconazole (NIZORAL) 2 % cream Apply to glans once daily (Patient taking differently: 2 (two) times daily as needed.)  . metFORMIN (GLUCOPHAGE-XR) 500 MG 24 hr tablet Take 2 tablets (1,000 mg total) by mouth 2 (two) times daily.  . metoprolol succinate (TOPROL-XL) 50 MG 24 hr tablet Take 1 tablet (50 mg total) by mouth at bedtime. Take with or immediately following a meal.  . olmesartan (BENICAR) 20 MG  tablet Take 1 tablet (20 mg total) by mouth at bedtime. TAKE 1 TABLET(20 MG) BY MOUTH DAILY  . oxybutynin (DITROPAN) 5 MG tablet Take 1 tablet (5 mg total) by mouth every 8 (eight) hours as needed for bladder spasms.  . tamsulosin (FLOMAX) 0.4 MG CAPS capsule Take 0.4 mg by mouth in the morning and at bedtime.    No facility-administered encounter medications on file as of 08/29/2020.    Current Diagnosis: Patient Active Problem List   Diagnosis Date Noted  . 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  . Iron deficiency 04/18/2020  . B12 deficiency 04/18/2020  . Anemia 04/17/2020  . Urinary retention 04/17/2020  . Balanitis 04/04/2020  . Hospital discharge follow-up 04/04/2020  . Prostatitis 03/28/2020  . Hypotension due to hypovolemia 03/27/2020  . Hyperkalemia 03/27/2020  . Acute kidney injury ([place]) 03/27/2020  . Right knee pain 03/07/2020  . Acute idiopathic gout of right knee 02/25/2020  . Bilateral carotid bruits 12/05/2019  . Type 2 diabetes mellitus with diabetic polyneuropathy, with long-term current use of insulin ([place]) 09/05/2019  . Diabetes mellitus ([place]) 09/05/2019  . Cervical radiculopathy 02/12/2019  . Constipation 02/12/2019  . DOE (dyspnea on exertion) 02/12/2019  . Chest pain 02/12/2019  . No-show for appointment 11/21/2018  . Coronary artery disease involving native heart 07/20/2018  . Bronchitis 07/20/2018  . Abnormal stress test   . Benign prostatic hyperplasia with urinary hesitancy 06/14/2018  .  Elevated LDL cholesterol level 06/14/2018  . Swelling of left foot 08/05/2017  . Nonintractable episodic headache 12/01/2016  . Cough 08/04/2016  . Lower abdominal pain 08/04/2016  . Abnormal PSA 05/19/2015  . OA (osteoarthritis) of knee 05/16/2015  . Foot ulcer, right ([place]) 04/15/2014  . Loss of vision 04/15/2014  . Screening for prostate cancer 11/03/2012  . Routine general medical examination at a health care facility 11/03/2012  .  Arthralgia 11/12/2010  . SHOULDER PAIN, BILATERAL 01/12/2010  . 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 HYPERCHOLESTEROLEMIA 07/29/2008  . CELLULITIS, FOOT, RIGHT 07/02/2008  . EDEMA 07/02/2008  . Type 2 diabetes mellitus with hyperglycemia, with long-term current use of insulin ([place]) 03/04/2007  . Essential hypertension 03/04/2007    Goals Addressed   None    Recent Relevant Labs: Lab Results  Component Value Date/Time   HGBA1C 8.0 (A) 07/29/2020 08:25 AM   HGBA1C 8.4 (H) 03/27/2020 11:15 PM   HGBA1C 9.3 (H) 12/05/2019 11:17 AM   MICROALBUR 40.5 (H) 12/05/2019 11:17 AM   MICROALBUR 37.8 (H) 07/23/2019 08:59 AM    Kidney Function Lab Results  Component Value Date/Time   CREATININE 0.82 06/05/2020 05:30 AM   CREATININE 0.91 06/02/2020 01:20 PM   GFR 95.59 04/17/2020 10:01 AM   GFRNONAA >60 06/05/2020 05:30 AM   GFRAA >60 04/06/2020 10:02 PM    . Current antihyperglycemic regimen:  ? Insulin NPH- regular (70/30)- 36 units twice daily  ? Metformin 500MG  XR 2 tablets twice daily  o  . What recent interventions/DTPs have been made to improve glycemic control:  o None ID  . Have there been any recent hospitalizations or ED visits since last visit with CPP? No . Patient daughter denies hypoglycemic symptoms, including Pale, Sweaty, Shaky, Hungry, Nervous/irritable and Vision changes . Patient daughter  denies hyperglycemic symptoms, including blurry vision, excessive thirst, fatigue, polyuria and weakness . How often are you checking your blood sugar? once daily . What are your blood sugars ranging?  o Fasting: N/A o Before meals:  - On 08/28/2020 it was 145. - On 08/27/2020 it was 150. - On 08/24/2020 it was 229. - On 08/23/2020 it was 193. - On 08/22/2020 it was 59. - On 08/22/2020 it was 60. - On 08/16/2020 it was 121 , and 263. o After meals:  - On 08/28/2020 it was 227. - On 08/26/2020 it was 218. o Bedtime: N/A  . During the week, how often does your blood  glucose drop below 70? Twice a week  o Patient daughter reports patient blood sugar was 59 and 60 on 08/22/2020 around 4:00 am . . Are you checking your feet daily/regularly?   Patient daughter states patient denies pain, tingling or numbness in his feet.  Adherence Review: Is the patient currently on a STATIN medication? Yes Is the patient currently on ACE/ARB medication? Yes Does the patient have >5 day gap between last estimated fill dates? Yes   Maryjean Ka  Follow-Up:  Pharmacist Review   Anderson [place] Clinical Pharmacist Assistant [phone]

## 2020-09-02 ENCOUNTER — Encounter: Payer: Self-pay | Admitting: Family Medicine

## 2020-09-03 ENCOUNTER — Other Ambulatory Visit: Payer: Self-pay | Admitting: Family Medicine

## 2020-09-24 ENCOUNTER — Telehealth: Payer: Self-pay

## 2020-09-24 NOTE — Progress Notes (Signed)
Chronic Care Management Pharmacy Assistant   Name: Scott Donaldson  MRN: [id number] DOB: 09-Dec-1950  Reason for Encounter:Hypertension Disease State Call.  Patient Questions:  1.  Have you seen any other providers since your last visit? No  2.  Any changes in your medicines or health? No     PCP : [name], MD  Allergies:  No Known Allergies  Medications: Outpatient Encounter Medications as of 09/24/2020  Medication Sig  . atorvastatin (LIPITOR) 40 MG tablet Take 1 tablet (40 mg total) by mouth daily. (Patient taking differently: Take 40 mg by mouth at bedtime.)  . calcium carbonate (TUMS - DOSED IN MG ELEMENTAL CALCIUM) 500 MG chewable tablet Chew 1 tablet by mouth as needed for indigestion or heartburn.  . Cyanocobalamin (B-12) 500 MCG TABS Take one daily.  . diclofenac Sodium (VOLTAREN) 1 % GEL Apply a small grape sized dollop to tender space of right knee up to 4 times daily. (Patient taking differently: Apply 2 g topically 4 (four) times daily as needed (right knee pain). Apply a small grape sized dollop to tender space of right knee up to 4 times daily.)  . gabapentin (NEURONTIN) 300 MG capsule Take one at night for one week and then increase to one twice daily. (Patient taking differently: Take 300 mg by mouth at bedtime.)  . glucose blood test strip USE TO CHECK BLOOD SUGAR TWICE DAILY  . insulin NPH-regular Human (70-30) 100 UNIT/ML injection Inject 36 Units into the skin 2 (two) times daily with a meal.  . Iron, Ferrous Sulfate, 325 (65 Fe) MG TABS Take one daily  . ketoconazole (NIZORAL) 2 % cream Apply to glans once daily (Patient taking differently: 2 (two) times daily as needed.)  . metFORMIN (GLUCOPHAGE-XR) 500 MG 24 hr tablet Take 2 tablets (1,000 mg total) by mouth 2 (two) times daily.  . metoprolol succinate (TOPROL-XL) 50 MG 24 hr tablet Take 1 tablet (50 mg total) by mouth at bedtime. Take with or immediately following a meal.  . olmesartan  (BENICAR) 20 MG tablet Take 1 tablet (20 mg total) by mouth at bedtime. TAKE 1 TABLET(20 MG) BY MOUTH DAILY  . oxybutynin (DITROPAN) 5 MG tablet Take 1 tablet (5 mg total) by mouth every 8 (eight) hours as needed for bladder spasms.  . tamsulosin (FLOMAX) 0.4 MG CAPS capsule Take 0.4 mg by mouth in the morning and at bedtime.    No facility-administered encounter medications on file as of 09/24/2020.    Current Diagnosis: Patient Active Problem List   Diagnosis Date Noted  . 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  . Iron deficiency 04/18/2020  . B12 deficiency 04/18/2020  . Anemia 04/17/2020  . Urinary retention 04/17/2020  . Balanitis 04/04/2020  . Hospital discharge follow-up 04/04/2020  . Prostatitis 03/28/2020  . Hypotension due to hypovolemia 03/27/2020  . Hyperkalemia 03/27/2020  . Acute kidney injury ([place]) 03/27/2020  . Right knee pain 03/07/2020  . Acute idiopathic gout of right knee 02/25/2020  . Bilateral carotid bruits 12/05/2019  . Type 2 diabetes mellitus with diabetic polyneuropathy, with long-term current use of insulin ([place]) 09/05/2019  . Diabetes mellitus ([place]) 09/05/2019  . Cervical radiculopathy 02/12/2019  . Constipation 02/12/2019  . DOE (dyspnea on exertion) 02/12/2019  . Chest pain 02/12/2019  . No-show for appointment 11/21/2018  . Coronary artery disease involving native heart 07/20/2018  . Bronchitis 07/20/2018  . Abnormal stress test   . Benign prostatic hyperplasia with urinary hesitancy  06/14/2018  . Elevated LDL cholesterol level 06/14/2018  . Swelling of left foot 08/05/2017  . Nonintractable episodic headache 12/01/2016  . Cough 08/04/2016  . Lower abdominal pain 08/04/2016  . Abnormal PSA 05/19/2015  . OA (osteoarthritis) of knee 05/16/2015  . Foot ulcer, right ([place]) 04/15/2014  . Loss of vision 04/15/2014  . Screening for prostate cancer 11/03/2012  . Routine general medical examination at a health care facility  11/03/2012  . Arthralgia 11/12/2010  . SHOULDER PAIN, BILATERAL 01/12/2010  . 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 HYPERCHOLESTEROLEMIA 07/29/2008  . CELLULITIS, FOOT, RIGHT 07/02/2008  . EDEMA 07/02/2008  . Type 2 diabetes mellitus with hyperglycemia, with long-term current use of insulin ([place]) 03/04/2007  . Essential hypertension 03/04/2007    Goals Addressed   None    Reviewed chart prior to disease state call. Spoke with patient regarding BP  Recent Office Vitals: BP Readings from Last 3 Encounters:  07/29/20 (!) 164/86  06/05/20 (!) (P) 155/55  06/02/20 (!) 151/58   Pulse Readings from Last 3 Encounters:  07/29/20 96  06/05/20 (P) 71  06/02/20 77    Wt Readings from Last 3 Encounters:  07/29/20 200 lb (90.7 kg)  06/04/20 189 lb 1.6 oz (85.8 kg)  06/02/20 204 lb (92.5 kg)     Kidney Function Lab Results  Component Value Date/Time   CREATININE 0.82 06/05/2020 05:30 AM   CREATININE 0.91 06/02/2020 01:20 PM   GFR 95.59 04/17/2020 10:01 AM   GFRNONAA >60 06/05/2020 05:30 AM   GFRAA >60 04/06/2020 10:02 PM    BMP Latest Ref Rng & Units 06/05/2020 06/02/2020 04/17/2020  Glucose 70 - 99 mg/dL 197(H) 315(H) 153(H)  BUN 8 - 23 mg/dL 20 18 14   Creatinine 0.61 - 1.24 mg/dL 0.82 0.91 0.80  BUN/Creat Ratio 10 - 24 - - -  Sodium 135 - 145 mmol/L 135 137 140  Potassium 3.5 - 5.1 mmol/L 4.4 4.3 4.2  Chloride 98 - 111 mmol/L 105 105 104  CO2 22 - 32 mmol/L 22 22 27   Calcium 8.9 - 10.3 mg/dL 8.4(L) 9.1 9.1    . Current antihypertensive regimen:  ? Olmesartan 20 mg  ? Metoprolol 50 mg   . How often are you checking your Blood Pressure? 3-5x per week . Current home BP readings:  o Patient daughter states patient blood pressure been ranging around 130/85 and with the highest around 145/80's. ? Patient daughter states patient denies headaches, lightheadedness and dizziness. Patient daughter reports patient cuff is on his left upper arm ,he is  sitting down with feet  flat on the floor without talking while checking his blood pressure.  o Patient daughter states patient blood pressure monitor batteries has died and she is going  to replace them and continuing to check his blood pressure.  . What recent interventions/DTPs have been made by any provider to improve Blood Pressure control since last CPP Visit: None ID . Any recent hospitalizations or ED visits since last visit with CPP? No . What diet changes have been made to improve Blood Pressure Control?  ? Patient daughter reports she cooks at home without adding salt, but the patient adds salt at the table for taste.  o  . What exercise is being done to improve your Blood Pressure Control?  ? Patient daughter reports patient does not exercise.   Patient daughter reports patient has a follow up with Dr Ethelene Hal this month on the 30 for his blood pressure.  Adherence Review: Is  the patient currently on ACE/ARB medication? Yes Does the patient have >5 day gap between last estimated fill dates? Yes   Maryjean Ka  Follow-Up:  Pharmacist Review   Anderson Malta Clinical Pharmacist Assistant [phone]

## 2020-10-03 ENCOUNTER — Ambulatory Visit: Payer: Medicare Other | Admitting: Endocrinology

## 2020-10-22 ENCOUNTER — Ambulatory Visit (INDEPENDENT_AMBULATORY_CARE_PROVIDER_SITE_OTHER): Payer: Medicare Other | Admitting: Family Medicine

## 2020-10-22 ENCOUNTER — Other Ambulatory Visit: Payer: Self-pay

## 2020-10-22 ENCOUNTER — Encounter: Payer: Self-pay | Admitting: Family Medicine

## 2020-10-22 VITALS — BP 170/76 | HR 52 | Temp 98.6°F | Ht 72.0 in | Wt 198.6 lb

## 2020-10-22 DIAGNOSIS — I10 Essential (primary) hypertension: Secondary | ICD-10-CM

## 2020-10-22 DIAGNOSIS — M25561 Pain in right knee: Secondary | ICD-10-CM | POA: Diagnosis not present

## 2020-10-22 DIAGNOSIS — E538 Deficiency of other specified B group vitamins: Secondary | ICD-10-CM

## 2020-10-22 DIAGNOSIS — E790 Hyperuricemia without signs of inflammatory arthritis and tophaceous disease: Secondary | ICD-10-CM | POA: Insufficient documentation

## 2020-10-22 DIAGNOSIS — D649 Anemia, unspecified: Secondary | ICD-10-CM

## 2020-10-22 DIAGNOSIS — E7800 Pure hypercholesterolemia, unspecified: Secondary | ICD-10-CM | POA: Diagnosis not present

## 2020-10-22 DIAGNOSIS — E611 Iron deficiency: Secondary | ICD-10-CM | POA: Diagnosis not present

## 2020-10-22 LAB — BASIC METABOLIC PANEL
BUN: 12 mg/dL (ref 6–23)
CO2: 27 mEq/L (ref 19–32)
Calcium: 9 mg/dL (ref 8.4–10.5)
Chloride: 105 mEq/L (ref 96–112)
Creatinine, Ser: 0.74 mg/dL (ref 0.40–1.50)
GFR: 91.85 mL/min (ref >60.00)
Glucose, Bld: 188 mg/dL — ABNORMAL HIGH (ref 70–99)
Potassium: 3.9 mEq/L (ref 3.5–5.1)
Sodium: 141 mEq/L (ref 135–145)

## 2020-10-22 LAB — URINALYSIS, ROUTINE W REFLEX MICROSCOPIC
Bilirubin Urine: NEGATIVE
Ketones, ur: NEGATIVE
Leukocytes,Ua: NEGATIVE
Nitrite: NEGATIVE
Specific Gravity, Urine: 1.02 (ref 1.000–1.030)
Total Protein, Urine: 100 — AB
Urine Glucose: >=1000 — AB
Urobilinogen, UA: 0.2 (ref 0.0–1.0)
pH: 5.5 (ref 5.0–8.0)

## 2020-10-22 LAB — LIPID PANEL
Cholesterol: 160 mg/dL (ref 0–200)
HDL: 40.8 mg/dL (ref >39.00)
LDL Cholesterol: 100 mg/dL — ABNORMAL HIGH (ref 0–99)
NonHDL: 119.66
Total CHOL/HDL Ratio: 4
Triglycerides: 100 mg/dL (ref 0.0–149.0)
VLDL: 20 mg/dL (ref 0.0–40.0)

## 2020-10-22 LAB — B12 AND FOLATE PANEL
Folate: 7.4 ng/mL (ref >5.9)
Vitamin B-12: 364 pg/mL (ref 211–911)

## 2020-10-22 LAB — CBC
HCT: 36.2 % — ABNORMAL LOW (ref 39.0–52.0)
Hemoglobin: 12.4 g/dL — ABNORMAL LOW (ref 13.0–17.0)
MCHC: 34.3 g/dL (ref 30.0–36.0)
MCV: 84 fl (ref 78.0–100.0)
Platelets: 176 10*3/uL (ref 150.0–400.0)
RBC: 4.31 Mil/uL (ref 4.22–5.81)
RDW: 14.7 % (ref 11.5–15.5)
WBC: 5.1 10*3/uL (ref 4.0–10.5)

## 2020-10-22 LAB — URIC ACID: Uric Acid, Serum: 5.3 mg/dL (ref 4.0–7.8)

## 2020-10-22 MED ORDER — OLMESARTAN MEDOXOMIL 40 MG PO TABS: 40.0000 mg | ORAL_TABLET | Freq: Every day | ORAL | 3 refills | Status: DC

## 2020-10-22 NOTE — Progress Notes (Signed)
Established Patient Office Visit  Subjective:  Patient ID: Scott Donaldson, male    DOB: 1950-11-27  Age: 70 y.o. MRN: [id number]  CC:  Chief Complaint  Patient presents with  . Follow-up    Follow up on BP and diabetes, patient states that blood sugar levels have been a little low. Would like a new referral to orthopedics for second opinion on leg pains.     HPI Scott Donaldson presents for follow-up of hypertension, elevated cholesterol, B12 and folate deficiency and right knee pain.  Right knee pain persists.  Had been referred some months ago for sports medicine management.  Has not improved pain persists in the middle part of the knee.  It is difficult for him to ambulate or climb stairs or ladder.  Requests orthopedic referral.  Continues B12 and folate and iron supplementation.   Supplementation.  Blood pressure has been elevated at home pends well.  He is taking all of his medicines including Benicar and metoprolol regularly.  Denies salt load.  Denies headache or blurred vision.  Past Medical History:  Diagnosis Date  . Abdominal pain 08/04/2016  . Abnormal PSA 05/19/2015  . Anemia   . Arthralgia 11/12/2010  . Benign prostatic hyperplasia with urinary hesitancy 06/14/2018  . Cataract   . Coronary artery disease    nonobstructive  . Diabetes ([place]) 03/04/2007   Qualifier: Diagnosis of  By: Loanne Drilling MD, Jacelyn Pi   . DIABETES MELLITUS, TYPE II 03/04/2007  . Elevated LDL cholesterol level 06/14/2018  . Foot ulcer, right ([place]) 04/15/2014  . GERD (gastroesophageal reflux disease)   . HOH (hard of hearing)    both ears no hearing aides  . HYPERCHOLESTEROLEMIA 07/29/2008  . HYPERTENSION 03/04/2007  . Knee pain, left 05/16/2015  . Nonintractable episodic headache 12/01/2016   migraine  . SHOULDER PAIN, BILATERAL 01/12/2010   Qualifier: Diagnosis of  By: Loanne Drilling MD, Sean A   . 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 COLONOSCOPY  07/2017  . LEFT HEART CATH AND CORONARY ANGIOGRAPHY N/A 07/12/2018   Procedure: LEFT HEART CATH AND CORONARY ANGIOGRAPHY;  Surgeon: Wellington Hampshire, MD;  Location: [facility];  Service: Cardiovascular;  Laterality: N/A;  . stress cardiolite  05/30/2003  . TRANSURETHRAL RESECTION OF PROSTATE N/A 06/04/2020   Procedure: TRANSURETHRAL RESECTION OF THE PROSTATE (TURP), BIPOLAR;  Surgeon: Ceasar Mons, MD;  Location: [facility];  Service: Urology;  Laterality: N/A;    Family History  Problem Relation Age of Onset  . Cancer Mother        Breast Cancer  . Cancer Father        uncertain type  . Diabetes Father   . Cancer Brother 53       Colon Cancer  . Colon cancer Neg Hx   . Rectal cancer Neg Hx   . Stomach cancer Neg Hx     Social History   Socioeconomic History  . Marital status: Single    Spouse name: Not on file  . Number of children: 2  . Years of education: 10  . Highest education level: Not on file  Occupational History  . Occupation: roofer  Tobacco Use  . Smoking status: Former Smoker    Packs/day: 1.50    Years: 25.00    Pack years: 37.50    Types: Cigarettes  Quit date: 07/26/1988    Years since quitting: 32.2  . Smokeless tobacco: Never Used  Vaping Use  . Vaping Use: Never used  Substance and Sexual Activity  . Alcohol use: No  . Drug use: No  . Sexual activity: Not on file  Other Topics Concern  . Not on file  Social History Narrative   Lives with daughter in a one story home.  Has 2 children.  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  Education: 10th grade.    Social Determinants of Health   Financial Resource Strain: Not on file  Food Insecurity: Not on file  Transportation Needs: Not on file  Physical Activity: Not on file  Stress: Not on file  Social Connections: Not on file  Intimate Partner Violence: Not on file    Outpatient Medications Prior to Visit   Medication Sig Dispense Refill  . atorvastatin (LIPITOR) 40 MG tablet Take 1 tablet (40 mg total) by mouth daily. (Patient taking differently: Take 40 mg by mouth at bedtime.) 90 tablet 3  . calcium carbonate (TUMS - DOSED IN MG ELEMENTAL CALCIUM) 500 MG chewable tablet Chew 1 tablet by mouth as needed for indigestion or heartburn.    . diclofenac Sodium (VOLTAREN) 1 % GEL Apply a small grape sized dollop to tender space of right knee up to 4 times daily. (Patient taking differently: Apply 2 g topically 4 (four) times daily as needed (right knee pain). Apply a small grape sized dollop to tender space of right knee up to 4 times daily.) 150 g 1  . glucose blood test strip USE TO CHECK BLOOD SUGAR TWICE DAILY 100 each 12  . insulin NPH-regular Human (70-30) 100 UNIT/ML injection Inject 36 Units into the skin 2 (two) times daily with a meal. 10 mL 11  . metFORMIN (GLUCOPHAGE-XR) 500 MG 24 hr tablet Take 2 tablets (1,000 mg total) by mouth 2 (two) times daily. 360 tablet 3  . metoprolol succinate (TOPROL-XL) 50 MG 24 hr tablet Take 1 tablet (50 mg total) by mouth at bedtime. Take with or immediately following a meal. 90 tablet 0  . oxybutynin (DITROPAN) 5 MG tablet Take 1 tablet (5 mg total) by mouth every 8 (eight) hours as needed for bladder spasms. 30 tablet 1  . tamsulosin (FLOMAX) 0.4 MG CAPS capsule Take 0.4 mg by mouth in the morning and at bedtime.     [name] olmesartan (BENICAR) 20 MG tablet Take 1 tablet (20 mg total) by mouth at bedtime. TAKE 1 TABLET(20 MG) BY MOUTH DAILY 90 tablet 0  . gabapentin (NEURONTIN) 300 MG capsule Take one at night for one week and then increase to one twice daily. (Patient not taking: Reported on 10/22/2020) 60 capsule 3  . Iron, Ferrous Sulfate, 325 (65 Fe) MG TABS Take one daily (Patient not taking: Reported on 10/22/2020) 90 tablet 4  . ketoconazole (NIZORAL) 2 % cream Apply to glans once daily (Patient not taking: Reported on 10/22/2020) 15 g 0  . Cyanocobalamin (B-12)  500 MCG TABS Take one daily. (Patient not taking: Reported on 10/22/2020) 90 tablet 4   No facility-administered medications prior to visit.    No Known Allergies  ROS Review of Systems  Constitutional: Negative.   HENT: Negative.   Eyes: Negative for photophobia and visual disturbance.  Respiratory: Negative.   Cardiovascular: Negative for chest pain, palpitations and leg swelling.  Gastrointestinal: Negative.   Endocrine: Negative for polyphagia and polyuria.  Genitourinary: Negative.   Musculoskeletal: Positive for  arthralgias, gait problem and joint swelling.  Skin: Negative for pallor and rash.  Neurological: Negative for weakness and light-headedness.  Psychiatric/Behavioral: Negative.       Objective:    Physical Exam Vitals and nursing note reviewed.  Constitutional:      General: He is not in acute distress.    Appearance: Normal appearance. He is not ill-appearing, toxic-appearing or diaphoretic.  HENT:     Head: Normocephalic and atraumatic.     Right Ear: Tympanic membrane, ear canal and external ear normal.     Left Ear: Tympanic membrane, ear canal and external ear normal.     Mouth/Throat:     Mouth: Mucous membranes are moist.     Pharynx: Oropharynx is clear. No oropharyngeal exudate or posterior oropharyngeal erythema.  Eyes:     General: No scleral icterus.    Extraocular Movements: Extraocular movements intact.     Conjunctiva/sclera: Conjunctivae normal.     Pupils: Pupils are equal, round, and reactive to light.  Cardiovascular:     Rate and Rhythm: Normal rate and regular rhythm.  Pulmonary:     Effort: Pulmonary effort is normal.     Breath sounds: Normal breath sounds.  Abdominal:     General: Bowel sounds are normal.  Musculoskeletal:     Cervical back: No rigidity or tenderness.     Right lower leg: No edema.     Left lower leg: No edema.  Lymphadenopathy:     Cervical: No cervical adenopathy.  Skin:    General: Skin is warm and dry.      Coloration: Skin is not jaundiced.  Neurological:     Mental Status: He is alert and oriented to person, place, and time.  Psychiatric:        Mood and Affect: Mood normal.        Behavior: Behavior normal.     BP (!) 170/76   Pulse (!) 52   Temp 98.6 F (37 C) (Temporal)   Ht 6' (1.829 m)   Wt 198 lb 9.6 oz (90.1 kg)   SpO2 97%   BMI 26.94 kg/m  Wt Readings from Last 3 Encounters:  10/22/20 198 lb 9.6 oz (90.1 kg)  07/29/20 200 lb (90.7 kg)  06/04/20 189 lb 1.6 oz (85.8 kg)     There are no preventive care reminders to display for this patient.  There are no preventive care reminders to display for this patient.  Lab Results  Component Value Date   TSH 1.18 08/04/2016   Lab Results  Component Value Date   WBC 8.5 06/05/2020   HGB 10.1 (L) 06/05/2020   HCT 30.4 (L) 06/05/2020   MCV 87.6 06/05/2020   PLT 199 06/05/2020   Lab Results  Component Value Date   NA 135 06/05/2020   K 4.4 06/05/2020   CO2 22 06/05/2020   GLUCOSE 197 (H) 06/05/2020   BUN 20 06/05/2020   CREATININE 0.82 06/05/2020   BILITOT 0.7 03/28/2020   ALKPHOS 54 03/28/2020   AST 18 03/28/2020   ALT 17 03/28/2020   PROT 7.0 03/28/2020   ALBUMIN 3.9 03/28/2020   CALCIUM 8.4 (L) 06/05/2020   ANIONGAP 8 06/05/2020   GFR 95.59 04/17/2020   Lab Results  Component Value Date   CHOL 198 07/23/2019   Lab Results  Component Value Date   HDL 42.60 07/23/2019   Lab Results  Component Value Date   LDLCALC 117 (H) 07/23/2019   Lab Results  Component Value Date  TRIG 191.0 (H) 07/23/2019   Lab Results  Component Value Date   CHOLHDL 5 07/23/2019   Lab Results  Component Value Date   HGBA1C 8.0 (A) 07/29/2020      Assessment & Plan:   Problem List Items Addressed This Visit      Cardiovascular and Mediastinum   Essential hypertension   Relevant Medications   olmesartan (BENICAR) 40 MG tablet   Other Relevant Orders   Basic metabolic panel   Urinalysis, Routine w reflex  microscopic     Endocrine   Type 2 diabetes mellitus with diabetic polyneuropathy, with long-term current use of insulin (HCC) - Primary   Relevant Medications   olmesartan (BENICAR) 40 MG tablet     Other   HYPERCHOLESTEROLEMIA   Relevant Medications   olmesartan (BENICAR) 40 MG tablet   Other Relevant Orders   Lipid panel   Elevated LDL cholesterol level   Right knee pain   Relevant Orders   Ambulatory referral to Orthopedic Surgery   Anemia   Relevant Orders   CBC   Iron deficiency   Relevant Orders   Iron, TIBC and Ferritin Panel   B12 deficiency   Relevant Orders   B12 and Folate Panel   Elevated uric acid in blood   Relevant Orders   Uric acid      Meds ordered this encounter  Medications  . olmesartan (BENICAR) 40 MG tablet    Sig: Take 1 tablet (40 mg total) by mouth daily.    Dispense:  90 tablet    Refill:  3    Follow-up: Return in about 3 months (around 01/22/2021), or if symptoms worsen or fail to improve.  Have increased olmesartan to 40 mg daily.  Daughter will check and record his blood pressures.  Follow-up in 3 months or sooner if the pressures do not average in the less than 140/90 range.  Given information on managing hypertension.  Continue all medicines and follow-up with endocrinology and neurology status post prostatectomy  [name], MD

## 2020-10-22 NOTE — Patient Instructions (Addendum)
Managing Your Hypertension Hypertension, also called high blood pressure, is when the force of the blood pressing against the walls of the arteries is too strong. Arteries are blood vessels that carry blood from your heart throughout your body. Hypertension forces the heart to work harder to pump blood and may cause the arteries to become narrow or stiff. Understanding blood pressure readings Your personal target blood pressure may vary depending on your medical conditions, your age, and other factors. A blood pressure reading includes a higher number over a lower number. Ideally, your blood pressure should be below 120/80. You should know that:  The first, or top, number is called the systolic pressure. It is a measure of the pressure in your arteries as your heart beats.  The second, or bottom number, is called the diastolic pressure. It is a measure of the pressure in your arteries as the heart relaxes. Blood pressure is classified into four stages. Based on your blood pressure reading, your health care provider may use the following stages to determine what type of treatment you need, if any. Systolic pressure and diastolic pressure are measured in a unit called mmHg. Normal  Systolic pressure: below 120.  Diastolic pressure: below 80. Elevated  Systolic pressure: 120-129.  Diastolic pressure: below 80. Hypertension stage 1  Systolic pressure: 130-139.  Diastolic pressure: 80-89. Hypertension stage 2  Systolic pressure: 140 or above.  Diastolic pressure: 90 or above. How can this condition affect me? Managing your hypertension is an important responsibility. Over time, hypertension can damage the arteries and decrease blood flow to important parts of the body, including the brain, heart, and kidneys. Having untreated or uncontrolled hypertension can lead to:  A heart attack.  A stroke.  A weakened blood vessel (aneurysm).  Heart failure.  Kidney damage.  Eye  damage.  Metabolic syndrome.  Memory and concentration problems.  Vascular dementia. What actions can I take to manage this condition? Hypertension can be managed by making lifestyle changes and possibly by taking medicines. Your health care provider will help you make a plan to bring your blood pressure within a normal range. Nutrition  Eat a diet that is high in fiber and potassium, and low in salt (sodium), added sugar, and fat. An example eating plan is called the Dietary Approaches to Stop Hypertension (DASH) diet. To eat this way: ? Eat plenty of fresh fruits and vegetables. Try to fill one-half of your plate at each meal with fruits and vegetables. ? Eat whole grains, such as whole-wheat pasta, brown rice, or whole-grain bread. Fill about one-fourth of your plate with whole grains. ? Eat low-fat dairy products. ? Avoid fatty cuts of meat, processed or cured meats, and poultry with skin. Fill about one-fourth of your plate with lean proteins such as fish, chicken without skin, beans, eggs, and tofu. ? Avoid pre-made and processed foods. These tend to be higher in sodium, added sugar, and fat.  Reduce your daily sodium intake. Most people with hypertension should eat less than 1,500 mg of sodium a day.   Lifestyle  Work with your health care provider to maintain a healthy body weight or to lose weight. Ask what an ideal weight is for you.  Get at least 30 minutes of exercise that causes your heart to beat faster (aerobic exercise) most days of the week. Activities may include walking, swimming, or biking.  Include exercise to strengthen your muscles (resistance exercise), such as weight lifting, as part of your weekly exercise routine. Try   to do these types of exercises for 30 minutes at least 3 days a week.  Do not use any products that contain nicotine or tobacco, such as cigarettes, e-cigarettes, and chewing tobacco. If you need help quitting, ask your health care  provider.  Control any long-term (chronic) conditions you have, such as high cholesterol or diabetes.  Identify your sources of stress and find ways to manage stress. This may include meditation, deep breathing, or making time for fun activities.   Alcohol use  Do not drink alcohol if: ? Your health care provider tells you not to drink. ? You are pregnant, may be pregnant, or are planning to become pregnant.  If you drink alcohol: ? Limit how much you use to:  0-1 drink a day for women.  0-2 drinks a day for men. ? Be aware of how much alcohol is in your drink. In the [place] one drink equals one 12 oz bottle of beer (355 mL), one 5 oz glass of wine (148 mL), or one 1 oz glass of hard liquor (44 mL). Medicines Your health care provider may prescribe medicine if lifestyle changes are not enough to get your blood pressure under control and if:  Your systolic blood pressure is 130 or higher.  Your diastolic blood pressure is 80 or higher. Take medicines only as told by your health care provider. Follow the directions carefully. Blood pressure medicines must be taken as told by your health care provider. The medicine does not work as well when you skip doses. Skipping doses also puts you at risk for problems. Monitoring Before you monitor your blood pressure:  Do not smoke, drink caffeinated beverages, or exercise within 30 minutes before taking a measurement.  Use the bathroom and empty your bladder (urinate).  Sit quietly for at least 5 minutes before taking measurements. Monitor your blood pressure at home as told by your health care provider. To do this:  Sit with your back straight and supported.  Place your feet flat on the floor. Do not cross your legs.  Support your arm on a flat surface, such as a table. Make sure your upper arm is at heart level.  Each time you measure, take two or three readings one minute apart and record the results. You may also need to have your  blood pressure checked regularly by your health care provider.   General information  Talk with your health care provider about your diet, exercise habits, and other lifestyle factors that may be contributing to hypertension.  Review all the medicines you take with your health care provider because there may be side effects or interactions.  Keep all visits as told by your health care provider. Your health care provider can help you create and adjust your plan for managing your high blood pressure. Where to find more information  National Heart, Lung, and Blood Institute: www.nhlbi.nih.gov  American Heart Association: www.heart.org Contact a health care provider if:  You think you are having a reaction to medicines you have taken.  You have repeated (recurrent) headaches.  You feel dizzy.  You have swelling in your ankles.  You have trouble with your vision. Get help right away if:  You develop a severe headache or confusion.  You have unusual weakness or numbness, or you feel faint.  You have severe pain in your chest or abdomen.  You vomit repeatedly.  You have trouble breathing. These symptoms may represent a serious problem that is an emergency. Do not wait   to see if the symptoms will go away. Get medical help right away. Call your local emergency services (911 in the [place] Do not drive yourself to the hospital. Summary  Hypertension is when the force of blood pumping through your arteries is too strong. If this condition is not controlled, it may put you at risk for serious complications.  Your personal target blood pressure may vary depending on your medical conditions, your age, and other factors. For most people, a normal blood pressure is less than 120/80.  Hypertension is managed by lifestyle changes, medicines, or both.  Lifestyle changes to help manage hypertension include losing weight, eating a healthy, low-sodium diet, exercising more, stopping smoking, and  limiting alcohol. This information is not intended to replace advice given to you by your health care provider. Make sure you discuss any questions you have with your health care provider. Document Revised: 08/17/2019 Document Reviewed: 06/12/2019 Elsevier Patient Education  2021 Elsevier Inc.  

## 2020-10-23 LAB — IRON,TIBC AND FERRITIN PANEL
%SAT: 23 % (calc) (ref 20–48)
Ferritin: 72 ng/mL (ref 24–380)
Iron: 78 ug/dL (ref 50–180)
TIBC: 344 mcg/dL (calc) (ref 250–425)

## 2020-10-30 DIAGNOSIS — M1711 Unilateral primary osteoarthritis, right knee: Secondary | ICD-10-CM | POA: Diagnosis not present

## 2020-10-30 DIAGNOSIS — M170 Bilateral primary osteoarthritis of knee: Secondary | ICD-10-CM | POA: Diagnosis not present

## 2020-11-13 ENCOUNTER — Other Ambulatory Visit: Payer: Self-pay | Admitting: Family Medicine

## 2020-11-13 DIAGNOSIS — I2510 Atherosclerotic heart disease of native coronary artery without angina pectoris: Secondary | ICD-10-CM

## 2020-11-13 DIAGNOSIS — E7800 Pure hypercholesterolemia, unspecified: Secondary | ICD-10-CM

## 2020-11-16 ENCOUNTER — Other Ambulatory Visit: Payer: Self-pay | Admitting: Family

## 2020-11-16 DIAGNOSIS — Z794 Long term (current) use of insulin: Secondary | ICD-10-CM

## 2020-11-16 DIAGNOSIS — E1142 Type 2 diabetes mellitus with diabetic polyneuropathy: Secondary | ICD-10-CM

## 2020-11-16 DIAGNOSIS — I10 Essential (primary) hypertension: Secondary | ICD-10-CM

## 2020-11-20 DIAGNOSIS — M25561 Pain in right knee: Secondary | ICD-10-CM | POA: Diagnosis not present

## 2020-11-20 DIAGNOSIS — M23203 Derangement of unspecified medial meniscus due to old tear or injury, right knee: Secondary | ICD-10-CM | POA: Diagnosis not present

## 2020-11-20 DIAGNOSIS — M170 Bilateral primary osteoarthritis of knee: Secondary | ICD-10-CM | POA: Diagnosis not present

## 2020-12-01 ENCOUNTER — Encounter: Payer: Self-pay | Admitting: Family Medicine

## 2020-12-01 ENCOUNTER — Other Ambulatory Visit: Payer: Self-pay | Admitting: Family Medicine

## 2020-12-01 ENCOUNTER — Other Ambulatory Visit: Payer: Self-pay

## 2020-12-01 MED ORDER — METOPROLOL SUCCINATE ER 50 MG PO TB24: 50.0000 mg | ORAL_TABLET | Freq: Every day | ORAL | 0 refills | Status: DC

## 2020-12-02 NOTE — Telephone Encounter (Signed)
He has an upcoming appointment with Dr. Ethelene Hal on 01/22/21.

## 2020-12-09 ENCOUNTER — Telehealth: Payer: Self-pay

## 2020-12-09 NOTE — Progress Notes (Signed)
Chronic Care Management Pharmacy Assistant   Name: Scott Donaldson  MRN: [id number] DOB: 1950-08-15  Reason for Encounter:Hypertension  Disease State Call.   Recent office visits:  10/22/2020 Dr.Kremer MD (PCP) - increased olmesartan to 40 mg daily  Recent consult visits:  No recent [facility] visits:  None in previous 6 months  Medications: Outpatient Encounter Medications as of 12/09/2020  Medication Sig  . atorvastatin (LIPITOR) 40 MG tablet TAKE 1 TABLET(40 MG) BY MOUTH DAILY  . calcium carbonate (TUMS - DOSED IN MG ELEMENTAL CALCIUM) 500 MG chewable tablet Chew 1 tablet by mouth as needed for indigestion or heartburn.  . diclofenac Sodium (VOLTAREN) 1 % GEL Apply a small grape sized dollop to tender space of right knee up to 4 times daily. (Patient taking differently: Apply 2 g topically 4 (four) times daily as needed (right knee pain). Apply a small grape sized dollop to tender space of right knee up to 4 times daily.)  . gabapentin (NEURONTIN) 300 MG capsule Take one at night for one week and then increase to one twice daily. (Patient not taking: Reported on 10/22/2020)  . glucose blood test strip USE TO CHECK BLOOD SUGAR TWICE DAILY  . insulin NPH-regular Human (70-30) 100 UNIT/ML injection Inject 36 Units into the skin 2 (two) times daily with a meal.  . Iron, Ferrous Sulfate, 325 (65 Fe) MG TABS Take one daily (Patient not taking: Reported on 10/22/2020)  . ketoconazole (NIZORAL) 2 % cream Apply to glans once daily (Patient not taking: Reported on 10/22/2020)  . metFORMIN (GLUCOPHAGE-XR) 500 MG 24 hr tablet Take 2 tablets (1,000 mg total) by mouth 2 (two) times daily.  . metoprolol succinate (TOPROL-XL) 50 MG 24 hr tablet Take 1 tablet (50 mg total) by mouth at bedtime. Take with or immediately following a meal.  . olmesartan (BENICAR) 40 MG tablet Take 1 tablet (40 mg total) by mouth daily.  [name] oxybutynin (DITROPAN) 5 MG tablet Take 1 tablet (5 mg total) by  mouth every 8 (eight) hours as needed for bladder spasms.  . tamsulosin (FLOMAX) 0.4 MG CAPS capsule TAKE 1 CAPSULE BY MOUTH EVERY DAY   No facility-administered encounter medications on file as of 12/09/2020.   Star Rating Drugs: Atorvastatin 40 mg last filled on 11/14/2020 for 90 day supply at [facility]. Metformin 500 mg last filled on 06/30/2020 for 90 day supply at [facility]. Olmesartan 40 mg last filled on 10/22/2020 for 90 day supply at [facility].  Reviewed chart prior to disease state call. Spoke with patient regarding BP  Recent Office Vitals: BP Readings from Last 3 Encounters:  10/22/20 (!) 170/76  07/29/20 (!) 164/86  06/05/20 (!) (P) 155/55   Pulse Readings from Last 3 Encounters:  10/22/20 (!) 52  07/29/20 96  06/05/20 (P) 71    Wt Readings from Last 3 Encounters:  10/22/20 198 lb 9.6 oz (90.1 kg)  07/29/20 200 lb (90.7 kg)  06/04/20 189 lb 1.6 oz (85.8 kg)     Kidney Function Lab Results  Component Value Date/Time   CREATININE 0.74 10/22/2020 09:51 AM   CREATININE 0.82 06/05/2020 05:30 AM   GFR 91.85 10/22/2020 09:51 AM   GFRNONAA >60 06/05/2020 05:30 AM   GFRAA >60 04/06/2020 10:02 PM    BMP Latest Ref Rng & Units 10/22/2020 06/05/2020 06/02/2020  Glucose 70 - 99 mg/dL 188(H) 197(H) 315(H)  BUN 6 - 23 mg/dL 12 20 18   Creatinine 0.40 - 1.50 mg/dL  0.74 0.82 0.91  BUN/Creat Ratio 10 - 24 - - -  Sodium 135 - 145 mEq/L 141 135 137  Potassium 3.5 - 5.1 mEq/L 3.9 4.4 4.3  Chloride 96 - 112 mEq/L 105 105 105  CO2 19 - 32 mEq/L 27 22 22   Calcium 8.4 - 10.5 mg/dL 9.0 8.4(L) 9.1    . Current antihypertensive regimen:  ? Olmesartan 40 mg 1 tablet daily ? Metoprolol 50 mg 1 tablet daily at bedtime . How often are you checking your Blood Pressure? infrequently . Current home BP readings:  o Patient daughter states patient blood pressure ranges around 140's/90's. o Patient daughter states patient denies headaches,dizziness and  lightheadedness.  . What recent interventions/DTPs have been made by any provider to improve Blood Pressure control since last CPP Visit:  o 10/22/2020 Dr.Kremer MD (PCP) - increased olmesartan to 40 mg daily . Any recent hospitalizations or ED visits since last visit with CPP? No . What diet changes have been made to improve Blood Pressure Control?  o None ID . What exercise is being done to improve your Blood Pressure Control?  o Patient daughter states patient does not exercise.  Adherence Review: Is the patient currently on ACE/ARB medication? Yes Does the patient have >5 day gap between last estimated fill dates? Yes   schedule appt with CPP  Metformin 500 mg last filled on 06/30/2020 for 90 day supply at [facility]. Patient daughter states patient denies any side effects or issue, but he does forget take his metformin from time to time.  Schedule a telephone follow up with Clinical Pharmacist on 02/17/2021 at 10:00.Sent Message to scheduler.  [place] Pharmacist Assistant [phone]

## 2020-12-15 DIAGNOSIS — M25561 Pain in right knee: Secondary | ICD-10-CM | POA: Diagnosis not present

## 2020-12-29 DIAGNOSIS — S83281A Other tear of lateral meniscus, current injury, right knee, initial encounter: Secondary | ICD-10-CM | POA: Diagnosis not present

## 2020-12-29 DIAGNOSIS — M1711 Unilateral primary osteoarthritis, right knee: Secondary | ICD-10-CM | POA: Diagnosis not present

## 2020-12-31 ENCOUNTER — Telehealth: Payer: Self-pay | Admitting: Family Medicine

## 2020-12-31 ENCOUNTER — Telehealth: Payer: Self-pay | Admitting: Cardiovascular Disease

## 2020-12-31 NOTE — Telephone Encounter (Signed)
RN Spoke with patients daughter who reports her dad is needing to have knee surgery and she has the preoperative clearance form. She was wanting to make sure it was okay to drop the form off at the office to be completed and determine if an appointment is needed. RN assured it was okay to drop the form off for the preop department to complete.

## 2020-12-31 NOTE — Telephone Encounter (Signed)
I put in Dr Bebe Shaggy folder up front

## 2020-12-31 NOTE — Telephone Encounter (Signed)
Pt wife dropped off form for Dr Ethelene Hal to sign, its for pts knee surgery, Pt can schedule once for is done. They request a call back when its done as well

## 2020-12-31 NOTE — Telephone Encounter (Signed)
PTS WIFE WOULD LIKE TO KNOW IF PT WILL NEED TO BE SEEN BEFORE GIVEN CLEARANCE FOR AN UPCOMING PROCEDURE, PT HASNT BEEN SEEN SINCE 2019. PLEASE ADVISE

## 2020-12-31 NOTE — Telephone Encounter (Signed)
Pt's daughter dropped off the clearance form that Dr. Theda Sers gave her. The clearance form is blank and I will need to call the surgeon's office to get the information. I have left a message for Caryl Pina, surgery scheduler for Dr. Theda Sers to please fax over a clearance request. Once clearance request has been received I will be sure to get the information to our pre op provider. Left message for Caryl Pina to please fax clearance request to [phone] attn: pre op team/Josefina Rynders.

## 2021-01-01 NOTE — Telephone Encounter (Signed)
ADDENDUM: closed note in error. First available appt is 01/29/21 at both the [facility] and [facility] office. Pt's daughter is agreeable to appt 01/29/21 with [name], NP 7/7 @ 2:15. I did also place the pt on the waitlist as well for possible sooner appt. Pt's daughter thanked me for the call and the help. I will send notes to NP for upcoming appt 7/7. Will send FYI to requesting office pt has appt 7/7.

## 2021-01-01 NOTE — Telephone Encounter (Signed)
Paperwork received waiting to be signed by Provider.

## 2021-01-01 NOTE — Telephone Encounter (Signed)
DPR ok to s/w pt's daughter Malachi Bonds who has been advised the pt is going to need an appt pre op clearance. Pt is agreeable pt needs appt. My first available is 01/29/21 b

## 2021-01-01 NOTE — Telephone Encounter (Signed)
Primary Cardiologist:None  Chart reviewed as part of pre-operative protocol coverage. Because of Scott Donaldson's past medical history and time since last visit, he/she will require a follow-up visit in order to better assess preoperative cardiovascular risk.  Pre-op covering staff: - Please schedule appointment and call patient to inform them. - Please contact requesting surgeon's office via preferred method (i.e, phone, fax) to inform them of need for appointment prior to surgery.  If applicable, this message will also be routed to pharmacy pool and/or primary cardiologist for input on holding anticoagulant/antiplatelet agent as requested below so that this information is available at time of patient's appointment.   Deberah Pelton, NP  01/01/2021, 10:37 AM

## 2021-01-01 NOTE — Telephone Encounter (Signed)
   [facility] Pre-operative Risk Assessment    Patient Name: Scott Donaldson  DOB: 11-07-1950 MRN: [id number]  HEARTCARE STAFF: - Please ensure there is not already an duplicate clearance open for this procedure. - Under Visit Info/Reason for Call, type in Other and utilize the format Clearance MM/DD/YY or Clearance TBD. Do not use dashes or single digits. - If request is for dental extraction, please clarify the # of teeth to be extracted. - If the patient is currently at the dentist's office, call Pre-Op APP to address. If the patient is not currently in the dentist office, please route to the Pre-Op pool  Request for surgical clearance:  What type of surgery is being performed? RIGHT TOTAL KNEE ARTHROPLASTY   When is this surgery scheduled? TBD   What type of clearance is required (medical clearance vs. Pharmacy clearance to hold med vs. Both)? MEDICAL  Are there any medications that need to be held prior to surgery and how long? NONE LISTED   Practice name and name of physician performing surgery? [facility]; DR. Hart Robinsons   What is the office phone number? [phone]; ATTN: ASHLEY HILTON   7.   What is the office fax number? [phone] ATTN: ASHLEY HILTON  8.   Anesthesia type (None, local, MAC, general) ? SPINAL   Julaine Hua 01/01/2021, 10:07 AM  _________________________________________________________________   (provider comments below)

## 2021-01-07 DIAGNOSIS — Z0279 Encounter for issue of other medical certificate: Secondary | ICD-10-CM

## 2021-01-08 DIAGNOSIS — R338 Other retention of urine: Secondary | ICD-10-CM | POA: Diagnosis not present

## 2021-01-08 NOTE — Telephone Encounter (Signed)
Done

## 2021-01-12 ENCOUNTER — Other Ambulatory Visit: Payer: Self-pay | Admitting: Internal Medicine

## 2021-01-20 ENCOUNTER — Encounter: Payer: Self-pay | Admitting: Endocrinology

## 2021-01-22 ENCOUNTER — Ambulatory Visit (INDEPENDENT_AMBULATORY_CARE_PROVIDER_SITE_OTHER): Payer: Medicare Other | Admitting: Family Medicine

## 2021-01-22 ENCOUNTER — Encounter: Payer: Self-pay | Admitting: Family Medicine

## 2021-01-22 ENCOUNTER — Other Ambulatory Visit: Payer: Self-pay

## 2021-01-22 VITALS — BP 158/62 | HR 60 | Temp 98.7°F | Ht 72.0 in | Wt 195.8 lb

## 2021-01-22 DIAGNOSIS — E7800 Pure hypercholesterolemia, unspecified: Secondary | ICD-10-CM

## 2021-01-22 DIAGNOSIS — D649 Anemia, unspecified: Secondary | ICD-10-CM | POA: Diagnosis not present

## 2021-01-22 DIAGNOSIS — I10 Essential (primary) hypertension: Secondary | ICD-10-CM

## 2021-01-22 DIAGNOSIS — E538 Deficiency of other specified B group vitamins: Secondary | ICD-10-CM

## 2021-01-22 LAB — LDL CHOLESTEROL, DIRECT: Direct LDL: 72 mg/dL

## 2021-01-22 LAB — LIPID PANEL
Cholesterol: 175 mg/dL (ref 0–200)
HDL: 33.7 mg/dL — ABNORMAL LOW (ref >39.00)
NonHDL: 141.66
Total CHOL/HDL Ratio: 5
Triglycerides: 358 mg/dL — ABNORMAL HIGH (ref 0.0–149.0)
VLDL: 71.6 mg/dL — ABNORMAL HIGH (ref 0.0–40.0)

## 2021-01-22 LAB — CBC
HCT: 36.8 % — ABNORMAL LOW (ref 39.0–52.0)
Hemoglobin: 13.1 g/dL (ref 13.0–17.0)
MCHC: 35.5 g/dL (ref 30.0–36.0)
MCV: 84 fl (ref 78.0–100.0)
Platelets: 150 K/uL (ref 150.0–400.0)
RBC: 4.38 Mil/uL (ref 4.22–5.81)
RDW: 13.5 % (ref 11.5–15.5)
WBC: 5.4 K/uL (ref 4.0–10.5)

## 2021-01-22 MED ORDER — VITAMIN B-12 1000 MCG PO TABS: 1000.0000 ug | ORAL_TABLET | Freq: Every day | ORAL | 1 refills | Status: DC

## 2021-01-22 MED ORDER — AMLODIPINE BESYLATE 5 MG PO TABS: 5.0000 mg | ORAL_TABLET | Freq: Every day | ORAL | 0 refills | Status: DC

## 2021-01-22 NOTE — Progress Notes (Signed)
Established Patient Office Visit  Subjective:  Patient ID: Scott Donaldson, male    DOB: [birth year]  Age: 70 y.o. MRN: [id number]  CC:  Chief Complaint  Patient presents with   3 month follow up    HTN & DM     HPI Scott Donaldson presents for follow-up of hypertension, elevated cholesterol, normocytic anemia.  Continues to take iron.  Has not been supplementing with B12.  Has been taking atorvastatin 40 mg daily.  Continues with higher dose of Benicar at 40 mg and metoprolol succinate at 50 mg daily.  Blood pressure is improved but not at goal.  Pulse rate is 60 today.  He has been checking it at home.  Not sure exactly what the numbers are.  He is seeing orthopedics and is scheduled to have a total knee replacement.  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  Past Medical History:  Diagnosis Date   Abdominal pain 08/04/2016   Abnormal PSA 05/19/2015   Anemia    Arthralgia 11/12/2010   Benign prostatic hyperplasia with urinary hesitancy 06/14/2018   Cataract    Coronary artery disease    nonobstructive   Diabetes ([place]) 03/04/2007   Qualifier: Diagnosis of  By: Loanne Drilling MD, Sean A    DIABETES MELLITUS, TYPE II 03/04/2007   Elevated LDL cholesterol level 06/14/2018   Foot ulcer, right ([place]) 04/15/2014   GERD (gastroesophageal reflux disease)    HOH (hard of hearing)    both ears no hearing aides   HYPERCHOLESTEROLEMIA 07/29/2008   HYPERTENSION 03/04/2007   Knee pain, left 05/16/2015   Nonintractable episodic headache 12/01/2016   migraine   SHOULDER PAIN, BILATERAL 01/12/2010   Qualifier: Diagnosis of  By: Loanne Drilling MD, Sean A    TUBULOVILLOUS ADENOMA, 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   Qualifier: Diagnosis of  By: Loanne Drilling MD, Jacelyn Pi     Past Surgical History:  Procedure Laterality Date   COLONOSCOPY  07/2017   LEFT HEART CATH AND CORONARY ANGIOGRAPHY N/A 07/12/2018   Procedure: LEFT HEART CATH AND CORONARY ANGIOGRAPHY;  Surgeon: Wellington Hampshire, MD;  Location: [facility];   Service: Cardiovascular;  Laterality: N/A;   stress cardiolite  05/30/2003   TRANSURETHRAL RESECTION OF PROSTATE N/A 06/04/2020   Procedure: TRANSURETHRAL RESECTION OF THE PROSTATE (TURP), BIPOLAR;  Surgeon: Ceasar Mons, MD;  Location[facility];  Service: Urology;  Laterality: N/A;    Family History  Problem Relation Age of Onset   Cancer Mother        Breast Cancer   Cancer Father        uncertain type   Diabetes Father    Cancer Brother 4       Colon Cancer   Colon cancer Neg Hx    Rectal cancer Neg Hx    Stomach cancer Neg Hx     Social History   Socioeconomic History   Marital status: Single    Spouse name: Not on file   Number of children: 2   Years of education: 10   Highest education level: Not on file  Occupational History   Occupation: roofer  Tobacco Use   Smoking status: Former    Packs/day: 1.50    Years: 25.00    Pack years: 37.50    Types: Cigarettes    Quit date: 07/26/1988    Years since quitting: 32.5   Smokeless tobacco: Never  Vaping Use   Vaping Use: Never used  Substance and Sexual Activity   Alcohol use: No   Drug use: No   Sexual activity: Not on file  Other Topics Concern   Not on file  Social History Narrative   Lives with daughter in a one story home.  Has 2 children.  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  Education: 10th grade.    Social Determinants of Health   Financial Resource Strain: Not on file  Food Insecurity: Not on file  Transportation Needs: Not on file  Physical Activity: Not on file  Stress: Not on file  Social Connections: Not on file  Intimate Partner Violence: Not on file    Outpatient Medications Prior to Visit  Medication Sig Dispense Refill   atorvastatin (LIPITOR) 40 MG tablet TAKE 1 TABLET(40 MG) BY MOUTH DAILY 90 tablet 3   calcium carbonate (TUMS - DOSED IN MG ELEMENTAL CALCIUM) 500 MG chewable tablet Chew 1 tablet by mouth as needed for indigestion or heartburn.      diclofenac Sodium (VOLTAREN) 1 % GEL Apply a small grape sized dollop to tender space of right knee up to 4 times daily. (Patient taking differently: Apply 2 g topically 4 (four) times daily as needed (right knee pain). Apply a small grape sized dollop to tender space of right knee up to 4 times daily.) 150 g 1   gabapentin (NEURONTIN) 300 MG capsule Take one at night for one week and then increase to one twice daily. 60 capsule 3   glucose blood test strip USE TO CHECK BLOOD SUGAR TWICE DAILY 100 each 12   insulin NPH-regular Human (70-30) 100 UNIT/ML injection Inject 36 Units into the skin 2 (two) times daily with a meal. 10 mL 11   Iron, Ferrous Sulfate, 325 (65 Fe) MG TABS Take one daily 90 tablet 4   meloxicam (MOBIC) 15 MG tablet Take 15 mg by mouth daily.     metFORMIN (GLUCOPHAGE-XR) 500 MG 24 hr tablet TAKE 2 TABLETS(1000 MG) BY MOUTH TWICE DAILY 60 tablet 0   metoprolol succinate (TOPROL-XL) 50 MG 24 hr tablet Take 1 tablet (50 mg total) by mouth at bedtime. Take with or immediately following a meal. 90 tablet 0   olmesartan (BENICAR) 40 MG tablet Take 1 tablet (40 mg total) by mouth daily. 90 tablet 3   oxybutynin (DITROPAN) 5 MG tablet Take 1 tablet (5 mg total) by mouth every 8 (eight) hours as needed for bladder spasms. 30 tablet 1   tamsulosin (FLOMAX) 0.4 MG CAPS capsule TAKE 1 CAPSULE BY MOUTH EVERY DAY 90 capsule 0   colchicine 0.6 MG tablet colchicine 0.6 mg tablet (Patient not taking: Reported on 01/22/2021)     ketoconazole (NIZORAL) 2 % cream Apply to glans once daily (Patient not taking: Reported on 10/22/2020) 15 g 0   meloxicam (MOBIC) 15 MG tablet meloxicam 15 mg tablet  TAKE 1 TABLET BY MOUTH EVERY DAY     No facility-administered medications prior to visit.    No Known Allergies  ROS Review of Systems  Constitutional: Negative.   HENT: Negative.    Eyes:  Negative for photophobia and visual disturbance.  Respiratory: Negative.    Cardiovascular: Negative.    Gastrointestinal: Negative.   Genitourinary: Negative.   Neurological:  Negative for dizziness, weakness and headaches.  Psychiatric/Behavioral: Negative.       Objective:    Physical Exam Vitals and nursing note reviewed.  Constitutional:      Appearance: Normal appearance.  HENT:  Head: Normocephalic and atraumatic.     Right Ear: External ear normal.     Left Ear: External ear normal.  Eyes:     General: No scleral icterus.       Right eye: No discharge.        Left eye: No discharge.     Extraocular Movements: Extraocular movements intact.     Conjunctiva/sclera: Conjunctivae normal.  Cardiovascular:     Rate and Rhythm: Normal rate and regular rhythm.  Pulmonary:     Effort: Pulmonary effort is normal.     Breath sounds: Normal breath sounds.  Skin:    General: Skin is warm and dry.  Neurological:     Mental Status: He is alert and oriented to person, place, and time.  Psychiatric:        Mood and Affect: Mood normal.        Behavior: Behavior normal.    BP (!) 158/62   Pulse 60   Temp 98.7 F (37.1 C)   Ht 6' (1.829 m)   Wt 195 lb 12.8 oz (88.8 kg)   SpO2 97%   BMI 26.56 kg/m  Wt Readings from Last 3 Encounters:  01/22/21 195 lb 12.8 oz (88.8 kg)  10/22/20 198 lb 9.6 oz (90.1 kg)  07/29/20 200 lb (90.7 kg)     Health Maintenance Due  Topic Date Due   Zoster Vaccines- Shingrix (1 of 2) Never done   OPHTHALMOLOGY EXAM  12/16/2020    There are no preventive care reminders to display for this patient.  Lab Results  Component Value Date   TSH 1.18 08/04/2016   Lab Results  Component Value Date   WBC 5.1 10/22/2020   HGB 12.4 (L) 10/22/2020   HCT 36.2 (L) 10/22/2020   MCV 84.0 10/22/2020   PLT 176.0 10/22/2020   Lab Results  Component Value Date   NA 141 10/22/2020   K 3.9 10/22/2020   CO2 27 10/22/2020   GLUCOSE 188 (H) 10/22/2020   BUN 12 10/22/2020   CREATININE 0.74 10/22/2020   BILITOT 0.7 03/28/2020   ALKPHOS 54 03/28/2020    AST 18 03/28/2020   ALT 17 03/28/2020   PROT 7.0 03/28/2020   ALBUMIN 3.9 03/28/2020   CALCIUM 9.0 10/22/2020   ANIONGAP 8 06/05/2020   GFR 91.85 10/22/2020   Lab Results  Component Value Date   CHOL 160 10/22/2020   Lab Results  Component Value Date   HDL 40.80 10/22/2020   Lab Results  Component Value Date   LDLCALC 100 (H) 10/22/2020   Lab Results  Component Value Date   TRIG 100.0 10/22/2020   Lab Results  Component Value Date   CHOLHDL 4 10/22/2020   Lab Results  Component Value Date   HGBA1C 8.0 (A) 07/29/2020      Assessment & Plan:   Problem List Items Addressed This Visit       Cardiovascular and Mediastinum   Essential hypertension - Primary   Relevant Medications   amLODipine (NORVASC) 5 MG tablet     Other   HYPERCHOLESTEROLEMIA   Relevant Medications   amLODipine (NORVASC) 5 MG tablet   Other Relevant Orders   Lipid panel   Anemia   Relevant Medications   vitamin B-12 (CYANOCOBALAMIN) 1000 MCG tablet   Other Relevant Orders   Vitamin B12   CBC   Iron, TIBC and Ferritin Panel   B12 deficiency   Relevant Medications   vitamin B-12 (CYANOCOBALAMIN) 1000 MCG tablet  Other Relevant Orders   Vitamin B12   CBC    Meds ordered this encounter  Medications   amLODipine (NORVASC) 5 MG tablet    Sig: Take 1 tablet (5 mg total) by mouth daily.    Dispense:  90 tablet    Refill:  0   vitamin B-12 (CYANOCOBALAMIN) 1000 MCG tablet    Sig: Take 1 tablet (1,000 mcg total) by mouth daily.    Dispense:  90 tablet    Refill:  1    Follow-up: Return in about 3 months (around 04/24/2021).  Blood pressure is improved but not at goal.  Have added Norvasc 5 mg to Benicar 40 mg and Toprol 50 mg.  Advised that the blood pressure will need to be controlled for his total knee replacement.  Rechecking CBC and B12 levels.  Iron levels were okay last visit.  B12 levels were low normal.  He has not been supplementing.  Have added B12 supplement today.  May  need to increase Lipitor levels.  [name], MD

## 2021-01-23 LAB — IRON,TIBC AND FERRITIN PANEL
%SAT: 23 % (calc) (ref 20–48)
Ferritin: 101 ng/mL (ref 24–380)
Iron: 77 ug/dL (ref 50–180)
TIBC: 333 mcg/dL (calc) (ref 250–425)

## 2021-01-23 LAB — VITAMIN B12: Vitamin B-12: 353 pg/mL (ref 211–911)

## 2021-01-27 ENCOUNTER — Ambulatory Visit: Payer: Medicare Other

## 2021-01-27 ENCOUNTER — Telehealth: Payer: Self-pay | Admitting: Family Medicine

## 2021-01-27 NOTE — Telephone Encounter (Signed)
Left message for patient to call back and reschedule Medicare Annual Wellness Visit (AWV) in office. Patient had appointment on 01/27/21, but NHA was off.  If not able to come in office, please offer to do virtually or by telephone.   Last AWV:08/04/2016  Please schedule at anytime with Nurse Health Advisor.

## 2021-01-29 ENCOUNTER — Other Ambulatory Visit: Payer: Self-pay

## 2021-01-29 ENCOUNTER — Ambulatory Visit: Payer: Medicare Other | Admitting: Cardiology

## 2021-01-29 ENCOUNTER — Encounter: Payer: Self-pay | Admitting: Cardiology

## 2021-01-29 VITALS — BP 124/72 | HR 56 | Ht 72.0 in | Wt 196.6 lb

## 2021-01-29 DIAGNOSIS — I10 Essential (primary) hypertension: Secondary | ICD-10-CM | POA: Diagnosis not present

## 2021-01-29 DIAGNOSIS — I447 Left bundle-branch block, unspecified: Secondary | ICD-10-CM

## 2021-01-29 DIAGNOSIS — I2510 Atherosclerotic heart disease of native coronary artery without angina pectoris: Secondary | ICD-10-CM | POA: Diagnosis not present

## 2021-01-29 DIAGNOSIS — E1142 Type 2 diabetes mellitus with diabetic polyneuropathy: Secondary | ICD-10-CM

## 2021-01-29 DIAGNOSIS — Z01818 Encounter for other preprocedural examination: Secondary | ICD-10-CM

## 2021-01-29 DIAGNOSIS — Z794 Long term (current) use of insulin: Secondary | ICD-10-CM

## 2021-01-29 DIAGNOSIS — R011 Cardiac murmur, unspecified: Secondary | ICD-10-CM

## 2021-01-29 MED ORDER — EZETIMIBE 10 MG PO TABS: 10.0000 mg | ORAL_TABLET | Freq: Every day | ORAL | 3 refills | Status: DC

## 2021-01-29 NOTE — Progress Notes (Addendum)
Cardiology Office Note   Date:  01/29/2021   ID:  Scott Donaldson, DOB Dec 17, 1950, MRN [id number]  PCP:  [name], Donaldson  Cardiologist:  Dr. [name]    Chief Complaint  Patient presents with   Pre-op Exam   Pre op   History of Present Illness: Scott Donaldson is Donaldson 70 y.o. male who presents for pre-op eval for Rt total knee arthroplasty Dr. Theda Sers Last visit 02/14/19 history of CAD, DM, HTN and HLD. Has had abnormal EKG - now with LBBB.   Has had cardiac cath back in December 2019- found to have mild to moderate CAD - 20-40% stenosis only managed medically  carotid dopplers with 1-39% stenosis last checked 2021.    Today he is doing well, no chest pain and no SOB.  No lightheadedness. He is able to walk up 2 flights of steps without chest pain.  His knee would bother him though. His diabetes has been with elvated glucose.  Donaldson review of labs from PCP LDL was 100 and HDL 33.  With known CAD will add zetia to meds.     Past Medical History:  Diagnosis Date   Abdominal pain 08/04/2016   Abnormal PSA 05/19/2015   Anemia    Arthralgia 11/12/2010   Benign prostatic hyperplasia with urinary hesitancy 06/14/2018   Cataract    Coronary artery disease    nonobstructive   Diabetes ([place]) 03/04/2007   Qualifier: Diagnosis of  By: Scott Donaldson, Scott Donaldson    DIABETES MELLITUS, TYPE II 03/04/2007   Elevated LDL cholesterol level 06/14/2018   Foot ulcer, right ([place]) 04/15/2014   GERD (gastroesophageal reflux disease)    HOH (hard of hearing)    both ears no hearing aides   HYPERCHOLESTEROLEMIA 07/29/2008   HYPERTENSION 03/04/2007   Knee pain, left 05/16/2015   Nonintractable episodic headache 12/01/2016   migraine   SHOULDER PAIN, BILATERAL 01/12/2010   Qualifier: Diagnosis of  By: Scott Donaldson, Scott Donaldson    TUBULOVILLOUS ADENOMA, 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   Qualifier: Diagnosis of  By: Scott Donaldson, Jacelyn Pi     Past Surgical History:  Procedure Laterality Date   COLONOSCOPY  07/2017   LEFT HEART CATH  AND CORONARY ANGIOGRAPHY N/Donaldson 07/12/2018   Procedure: LEFT HEART CATH AND CORONARY ANGIOGRAPHY;  Surgeon: Scott Donaldson;  Location: [facility];  Service: Cardiovascular;  Laterality: N/Donaldson;   stress cardiolite  05/30/2003   TRANSURETHRAL RESECTION OF PROSTATE N/Donaldson 06/04/2020   Procedure: TRANSURETHRAL RESECTION OF THE PROSTATE (TURP), BIPOLAR;  Surgeon: Scott Donaldson;  Location: [facility];  Service: Urology;  Laterality: N/Donaldson;     Current Outpatient Medications  Medication Sig Dispense Refill   amLODipine (NORVASC) 5 MG tablet Take 1 tablet (5 mg total) by mouth daily. 90 tablet 0   atorvastatin (LIPITOR) 40 MG tablet TAKE 1 TABLET(40 MG) BY MOUTH DAILY 90 tablet 3   calcium carbonate (TUMS - DOSED IN MG ELEMENTAL CALCIUM) 500 MG chewable tablet Chew 1 tablet by mouth as needed for indigestion or heartburn.     diclofenac Sodium (VOLTAREN) 1 % GEL Apply Donaldson small grape sized dollop to tender space of right knee up to 4 times daily. 150 g 1   ezetimibe (ZETIA) 10 MG tablet Take 1 tablet (10 mg total) by mouth daily. 90 tablet 3   glucose blood test strip USE TO CHECK BLOOD SUGAR TWICE DAILY 100 each 12   insulin NPH-regular Human (70-30) 100  UNIT/ML injection Inject 36 Units into the skin 2 (two) times daily with Donaldson meal. 10 mL 11   Iron, Ferrous Sulfate, 325 (65 Fe) MG TABS Take one daily 90 tablet 4   metFORMIN (GLUCOPHAGE-XR) 500 MG 24 hr tablet TAKE 2 TABLETS(1000 MG) BY MOUTH TWICE DAILY 60 tablet 0   metoprolol succinate (TOPROL-XL) 50 MG 24 hr tablet Take 1 tablet (50 mg total) by mouth at bedtime. Take with or immediately following Donaldson meal. 90 tablet 0   olmesartan (BENICAR) 40 MG tablet Take 1 tablet (40 mg total) by mouth daily. 90 tablet 3   vitamin B-12 (CYANOCOBALAMIN) 1000 MCG tablet Take 1 tablet (1,000 mcg total) by mouth daily. 90 tablet 1   colchicine 0.6 MG tablet colchicine 0.6 mg tablet (Patient not taking: Reported on 01/29/2021)      gabapentin (NEURONTIN) 300 MG capsule Take one at night for one week and then increase to one twice daily. (Patient not taking: Reported on 01/29/2021) 60 capsule 3   meloxicam (MOBIC) 15 MG tablet Take 15 mg by mouth daily. (Patient not taking: Reported on 01/29/2021)     oxybutynin (DITROPAN) 5 MG tablet Take 1 tablet (5 mg total) by mouth every 8 (eight) hours as needed for bladder spasms. (Patient not taking: Reported on 01/29/2021) 30 tablet 1   tamsulosin (FLOMAX) 0.4 MG CAPS capsule TAKE 1 CAPSULE BY MOUTH EVERY DAY (Patient not taking: Reported on 01/29/2021) 90 capsule 0   No current facility-administered medications for this visit.    Allergies:   Patient has no known allergies.    Social History:  The patient  reports that he quit smoking about 32 years ago. His smoking use included cigarettes. He has Donaldson 37.50 pack-year smoking history. He has never used smokeless tobacco. He reports that he does not drink alcohol and does not use drugs.   Family History:  The patient's family history includes Cancer in his father and mother; Cancer (age of onset: 44) in his brother; Diabetes in his father.    ROS:  General:no colds or fevers, no weight changes Skin:no rashes or ulcers HEENT:no blurred vision, no congestion CV:see HPI PUL:see HPI GI:no diarrhea constipation or melena, no indigestion GU:no hematuria, no dysuria MS:no joint pain, no claudication Neuro:no syncope, no lightheadedness Endo:+ diabetes, no thyroid disease  Wt Readings from Last 3 Encounters:  01/29/21 196 lb 9.6 oz (89.2 kg)  01/22/21 195 lb 12.8 oz (88.8 kg)  10/22/20 198 lb 9.6 oz (90.1 kg)     PHYSICAL EXAM: VS:  BP 124/72   Pulse (!) 56   Ht 6' (1.829 m)   Wt 196 lb 9.6 oz (89.2 kg)   SpO2 97%   BMI 26.66 kg/m  , BMI Body mass index is 26.66 kg/m. General:Pleasant affect, NAD Skin:Warm and dry, brisk capillary refill HEENT:normocephalic, sclera clear, mucus membranes moist Neck:supple, no JVD, no bruits   Heart:S1S2 RRR with soft systolic 1/6 murmur, no gallup, rub or click Lungs:clear without rales, rhonchi, or wheezes CHE:NIDP, non tender, + BS, do not palpate liver spleen or masses Ext:no lower ext edema, 2+ pedal pulses, 2+ radial pulses Neuro:alert and oriented X 3, MAE, follows commands, + facial symmetry    EKG:  EKG is ordered today. The ekg ordered today demonstrates    Recent Labs: 03/28/2020: ALT 17 10/22/2020: BUN 12; Creatinine, Ser 0.74; Potassium 3.9; Sodium 141 01/22/2021: Hemoglobin 13.1; Platelets 150.0    Lipid Panel    Component Value Date/Time   CHOL  175 01/22/2021 0906   TRIG 358.0 (H) 01/22/2021 0906   HDL 33.70 (L) 01/22/2021 0906   CHOLHDL 5 01/22/2021 0906   VLDL 71.6 (H) 01/22/2021 0906   LDLCALC 100 (H) 10/22/2020 0951   LDLDIRECT 72.0 01/22/2021 0906       Other studies Reviewed: Additional studies/ records that were reviewed today include: SB at 37 and LBBB no acute changes. . Other Studies Reviewed Today:   Cardiac cath 07/12/18 The left ventricular systolic function is normal. LV end diastolic pressure is mildly elevated. The left ventricular ejection fraction is 55-65% by visual estimate. Prox RCA lesion is 20% stenosed. Dist RCA lesion is 40% stenosed. Dist LM lesion is 20% stenosed. Mid LAD lesion is 40% stenosed.   1.  Mild to moderate nonobstructive coronary artery disease.  Mildly calcified vessels overall. 2.  Normal LV systolic function mildly elevated left ventricular end-diastolic pressure.   Recommendations: Continue aggressive medical therapy and treatment of risk factors.     NUC 07/04/18 Study Highlights     The left ventricular ejection fraction is normal (55-65%). Nuclear stress EF: 56%. There was no ST segment deviation noted during stress. Findings consistent with prior myocardial infarction. This is Donaldson low risk study. Blood pressure demonstrated Donaldson normal response to exercise.   Possible small inferior wall  infarct from apex to base no ischemia Defect persists in upright and supine stress images No RWMA;s EF normal 56% Low risk study      ASSESSMENT AND PLAN:  1.  Pre-op eval for Rt total knee.  Pt with known CAD but non obstructive and meets 4 METS of exertion.  Class II risk with 0.9% risk of major cardiac event. Acceptable for surgery. He has chronic LBBB  2. HLD with goal LDL <70 will add Zetia to meds and recheck hepatic and lipids in 6-8 weeks. Reviewed labs from PCP.  3.  CAD stable  4.  Soft systolic murmur will check echo but may proceed with surgery prior to echo.     5.  DM-2 on insulin and A1C of 8, discussed diet.   6. HTN stable   Faxed progress note to Dr. Theda Sers with clearance in note.   Current medicines are reviewed with the patient today.  The patient Has no concerns regarding medicines.  The following changes have been made:  See above Labs/ tests ordered today include:see above  Disposition:   FU:  see above  Signed, [name], NP  01/29/2021 2:53 PM    [facility] [place], [place], [place], [place] Phone: [phone]; Fax: [phone]

## 2021-01-29 NOTE — Patient Instructions (Addendum)
Medication Instructions:  Your physician has recommended you make the following change in your medication:   START Zetia 10 mg taking 1 daily   *If you need a refill on your cardiac medications before your next appointment, please call your pharmacy*   Lab Work: 4-6 WEEKS FASTING:  LIPID & LFT   If you have labs (blood work) drawn today and your tests are completely normal, you will receive your results only by: [place] (if you have MyChart) OR A paper copy in the mail If you have any lab test that is abnormal or we need to change your treatment, we will call you to review the results.   Testing/Procedures: Your physician has requested that you have an echocardiogram. Echocardiography is a painless test that uses sound waves to create images of your heart. It provides your doctor with information about the size and shape of your heart and how well your heart's chambers and valves are working. This procedure takes approximately one hour. There are no restrictions for this procedure.    Follow-Up: At [facility], you and your health needs are our priority.  As part of our continuing mission to provide you with exceptional heart care, we have created designated Provider Care Teams.  These Care Teams include your primary Cardiologist (physician) and Advanced Practice Providers (APPs -  Physician Assistants and Nurse Practitioners) who all work together to provide you with the care you need, when you need it.  We recommend signing up for the patient portal called "MyChart".  Sign up information is provided on this After Visit Summary.  MyChart is used to connect with patients for Virtual Visits (Telemedicine).  Patients are able to view lab/test results, encounter notes, upcoming appointments, etc.  Non-urgent messages can be sent to your provider as well.   To learn more about what you can do with MyChart, go to [email or web address].    Your next appointment:   12  month(s)  The format for your next appointment:   In Person  Provider:   You may see Jenkins Rouge, MD or one of the following Advanced Practice Providers on your designated Care Team:   Kathyrn Drown, NP   Other Instructions

## 2021-02-02 NOTE — Telephone Encounter (Signed)
Pt's call back # is [phone]  Thank you!

## 2021-02-05 NOTE — Telephone Encounter (Signed)
Pt AWV was cancelled on 01/27/21 I believe that was the day all of them had to be cancelled.  He is in pain and cannot have surgery until this is done. Is there anyway we can schedule him before our next available 03/17/21.  Thank you

## 2021-02-05 NOTE — Telephone Encounter (Signed)
Scott Donaldson or Scott Donaldson - can you work pt in before 8/23 - that is the first available the office has and pt is dissatisfied with that.

## 2021-02-09 ENCOUNTER — Other Ambulatory Visit: Payer: Self-pay

## 2021-02-09 ENCOUNTER — Encounter: Payer: Self-pay | Admitting: Family Medicine

## 2021-02-09 ENCOUNTER — Ambulatory Visit (INDEPENDENT_AMBULATORY_CARE_PROVIDER_SITE_OTHER): Payer: Medicare Other | Admitting: Family Medicine

## 2021-02-09 ENCOUNTER — Ambulatory Visit (INDEPENDENT_AMBULATORY_CARE_PROVIDER_SITE_OTHER): Payer: Medicare Other

## 2021-02-09 ENCOUNTER — Telehealth: Payer: Self-pay | Admitting: Endocrinology

## 2021-02-09 VITALS — BP 102/68 | HR 67 | Temp 97.9°F | Wt 190.6 lb

## 2021-02-09 DIAGNOSIS — Z01818 Encounter for other preprocedural examination: Secondary | ICD-10-CM

## 2021-02-09 NOTE — Telephone Encounter (Signed)
I spoke to patient's daughter and apologized. I was off last week and unable to call last week.  Patient needed surgical clearance with Dr. Ethelene Hal for his knee surgery and is scheduled for today for clearance with Dr.Kremer. I let daughter know this appointment was for his annual wellness visit with the nurse. I asked her to call back and reschedule awv when he's feeling better after the surgery.

## 2021-02-09 NOTE — Telephone Encounter (Signed)
please contact patient: Please add on at 2:45 PM, 02/13/21

## 2021-02-09 NOTE — Progress Notes (Signed)
Established Patient Office Visit  Subjective:  Patient ID: Scott Donaldson, male    DOB: [birth year]  Age: 70 y.o. MRN: [id number]  CC:  Chief Complaint  Patient presents with   Medical Clearance    Patient here for medical clearance for surgery on right knee.     HPI Scott Donaldson presents for presurgical clearance for much-needed right total knee replacement.  Blood pressure and LDL cholesterol have been well controlled.  Triglycerides were elevated with last lipid profile and patient was asked to return fasting for repeat lipid profile.  His daughter tells me that he has been cleared by cardiology but he has not seen the cardiologist for 3 years now.  He was scheduled to see the nurse practitioner for preop follow-up on July 7.  last saw endocrinology back in January with Donaldson hemoglobin A1c of 8.  This is the best hemoglobin A1c seen for him.  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  He does not smoke.  Past Medical History:  Diagnosis Date   Abdominal pain 08/04/2016   Abnormal PSA 05/19/2015   Anemia    Arthralgia 11/12/2010   Benign prostatic hyperplasia with urinary hesitancy 06/14/2018   Cataract    Coronary artery disease    nonobstructive   Diabetes ([place]) 03/04/2007   Qualifier: Diagnosis of  By: Scott Donaldson    DIABETES MELLITUS, TYPE II 03/04/2007   Elevated LDL cholesterol level 06/14/2018   Foot ulcer, right ([place]) 04/15/2014   GERD (gastroesophageal reflux disease)    HOH (hard of hearing)    both ears no hearing aides   HYPERCHOLESTEROLEMIA 07/29/2008   HYPERTENSION 03/04/2007   Knee pain, left 05/16/2015   Nonintractable episodic headache 12/01/2016   migraine   SHOULDER PAIN, BILATERAL 01/12/2010   Qualifier: Diagnosis of  By: Scott Donaldson    TUBULOVILLOUS ADENOMA, 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   Qualifier: Diagnosis of  By: Scott Drilling Donaldson, Scott Donaldson     Past Surgical History:  Procedure Laterality Date   COLONOSCOPY  07/2017   LEFT HEART CATH AND CORONARY ANGIOGRAPHY N/Donaldson  07/12/2018   Procedure: LEFT HEART CATH AND CORONARY ANGIOGRAPHY;  Surgeon: Scott Hampshire, Donaldson;  Location: [facility];  Service: Cardiovascular;  Laterality: N/Donaldson;   stress cardiolite  05/30/2003   TRANSURETHRAL RESECTION OF PROSTATE N/Donaldson 06/04/2020   Procedure: TRANSURETHRAL RESECTION OF THE PROSTATE (TURP), BIPOLAR;  Surgeon: Scott Donaldson;  Location: [facility];  Service: Urology;  Laterality: N/Donaldson;    Family History  Problem Relation Age of Onset   Cancer Mother        Breast Cancer   Cancer Father        uncertain type   Diabetes Father    Cancer Brother 26       Colon Cancer   Colon cancer Neg Hx    Rectal cancer Neg Hx    Stomach cancer Neg Hx     Social History   Socioeconomic History   Marital status: Single    Spouse name: Not on file   Number of children: 2   Years of education: 10   Highest education level: Not on file  Occupational History   Occupation: roofer  Tobacco Use   Smoking status: Former    Packs/day: 1.50    Years: 25.00    Pack years: 37.50    Types: Cigarettes    Quit date: 07/26/1988    Years since quitting: 26.5  Smokeless tobacco: Never  Vaping Use   Vaping Use: Never used  Substance and Sexual Activity   Alcohol use: No   Drug use: No   Sexual activity: Not on file  Other Topics Concern   Not on file  Social History Narrative   Lives with daughter in [employer] one story home.  Has 2 children.  Semi-retired.  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  Education: 10th grade.    Social Determinants of Health   Financial Resource Strain: Not on file  Food Insecurity: Not on file  Transportation Needs: Not on file  Physical Activity: Not on file  Stress: Not on file  Social Connections: Not on file  Intimate Partner Violence: Not on file    Outpatient Medications Prior to Visit  Medication Sig Dispense Refill   amLODipine (NORVASC) 5 MG tablet Take 1 tablet (5 mg total) by mouth daily. 90 tablet 0   atorvastatin  (LIPITOR) 40 MG tablet TAKE 1 TABLET(40 MG) BY MOUTH DAILY 90 tablet 3   calcium carbonate (TUMS - DOSED IN MG ELEMENTAL CALCIUM) 500 MG chewable tablet Chew 1 tablet by mouth as needed for indigestion or heartburn.     diclofenac Sodium (VOLTAREN) 1 % GEL Apply Donaldson small grape sized dollop to tender space of right knee up to 4 times daily. 150 g 1   ezetimibe (ZETIA) 10 MG tablet Take 1 tablet (10 mg total) by mouth daily. 90 tablet 3   glucose blood test strip USE TO CHECK BLOOD SUGAR TWICE DAILY 100 each 12   insulin NPH-regular Human (70-30) 100 UNIT/ML injection Inject 36 Units into the skin 2 (two) times daily with Donaldson meal. 10 mL 11   Iron, Ferrous Sulfate, 325 (65 Fe) MG TABS Take one daily 90 tablet 4   metFORMIN (GLUCOPHAGE-XR) 500 MG 24 hr tablet TAKE 2 TABLETS(1000 MG) BY MOUTH TWICE DAILY 60 tablet 0   metoprolol succinate (TOPROL-XL) 50 MG 24 hr tablet Take 1 tablet (50 mg total) by mouth at bedtime. Take with or immediately following Donaldson meal. 90 tablet 0   olmesartan (BENICAR) 40 MG tablet Take 1 tablet (40 mg total) by mouth daily. 90 tablet 3   vitamin B-12 (CYANOCOBALAMIN) 1000 MCG tablet Take 1 tablet (1,000 mcg total) by mouth daily. 90 tablet 1   colchicine 0.6 MG tablet colchicine 0.6 mg tablet (Patient not taking: Reported on 01/29/2021)     gabapentin (NEURONTIN) 300 MG capsule Take one at night for one week and then increase to one twice daily. (Patient not taking: No sig reported) 60 capsule 3   meloxicam (MOBIC) 15 MG tablet Take 15 mg by mouth daily. (Patient not taking: No sig reported)     oxybutynin (DITROPAN) 5 MG tablet Take 1 tablet (5 mg total) by mouth every 8 (eight) hours as needed for bladder spasms. (Patient not taking: Reported on 01/29/2021) 30 tablet 1   tamsulosin (FLOMAX) 0.4 MG CAPS capsule TAKE 1 CAPSULE BY MOUTH EVERY DAY (Patient not taking: Reported on 01/29/2021) 90 capsule 0   No facility-administered medications prior to visit.    No Known  Allergies  ROS Review of Systems  Constitutional: Negative.   Respiratory: Negative.    Cardiovascular: Negative.   Gastrointestinal: Negative.   Musculoskeletal:  Positive for arthralgias and gait problem.  Neurological:  Negative for speech difficulty and weakness.  Psychiatric/Behavioral: Negative.       Objective:    Physical Exam Vitals and nursing note reviewed.  Constitutional:  General: He is not in acute distress.    Appearance: Normal appearance. He is not ill-appearing, toxic-appearing or diaphoretic.  HENT:     Head: Normocephalic and atraumatic.  Eyes:     General: No scleral icterus.       Right eye: No discharge.        Left eye: No discharge.     Extraocular Movements: Extraocular movements intact.     Conjunctiva/sclera: Conjunctivae normal.     Pupils: Pupils are equal, round, and reactive to light.  Cardiovascular:     Rate and Rhythm: Normal rate and regular rhythm.     Pulses:          Dorsalis pedis pulses are 1+ on the right side and 1+ on the left side.       Posterior tibial pulses are 1+ on the right side and 1+ on the left side.     Comments: Capillary refill in great toes is brisk. Pulmonary:     Effort: Pulmonary effort is normal.     Breath sounds: Normal breath sounds.  Musculoskeletal:     Cervical back: No rigidity or tenderness.     Right lower leg: No edema.     Left lower leg: No edema.  Lymphadenopathy:     Cervical: No cervical adenopathy.  Skin:    General: Skin is warm and dry.  Neurological:     Mental Status: He is alert and oriented to person, place, and time.  Psychiatric:        Mood and Affect: Mood normal.        Behavior: Behavior normal.   Diabetic Foot Exam - Simple   Simple Foot Form Visual Inspection No deformities, no ulcerations, no other skin breakdown bilaterally: Yes Sensation Testing Pulse Check Posterior Tibialis and Dorsalis pulse intact bilaterally: Yes Comments Feet are cavus.     BP  102/68   Pulse 67   Temp 97.9 F (36.6 C) (Temporal)   Wt 190 lb 9.6 oz (86.5 kg)   SpO2 97%   BMI 25.85 kg/m  Wt Readings from Last 3 Encounters:  02/09/21 190 lb 9.6 oz (86.5 kg)  01/29/21 196 lb 9.6 oz (89.2 kg)  01/22/21 195 lb 12.8 oz (88.8 kg)     Health Maintenance Due  Topic Date Due   Zoster Vaccines- Shingrix (1 of 2) Never done   OPHTHALMOLOGY EXAM  12/16/2020   HEMOGLOBIN A1C  01/26/2021    There are no preventive care reminders to display for this patient.  Lab Results  Component Value Date   TSH 1.18 08/04/2016   Lab Results  Component Value Date   WBC 5.4 01/22/2021   HGB 13.1 01/22/2021   HCT 36.8 (L) 01/22/2021   MCV 84.0 01/22/2021   PLT 150.0 01/22/2021   Lab Results  Component Value Date   NA 141 10/22/2020   K 3.9 10/22/2020   CO2 27 10/22/2020   GLUCOSE 188 (H) 10/22/2020   BUN 12 10/22/2020   CREATININE 0.74 10/22/2020   BILITOT 0.7 03/28/2020   ALKPHOS 54 03/28/2020   AST 18 03/28/2020   ALT 17 03/28/2020   PROT 7.0 03/28/2020   ALBUMIN 3.9 03/28/2020   CALCIUM 9.0 10/22/2020   ANIONGAP 8 06/05/2020   GFR 91.85 10/22/2020   Lab Results  Component Value Date   CHOL 175 01/22/2021   Lab Results  Component Value Date   HDL 33.70 (L) 01/22/2021   Lab Results  Component Value Date  [place] 100 (H) 10/22/2020   Lab Results  Component Value Date   TRIG 358.0 (H) 01/22/2021   Lab Results  Component Value Date   CHOLHDL 5 01/22/2021   Lab Results  Component Value Date   HGBA1C 8.0 (Donaldson) 07/29/2020      Assessment & Plan:   Problem List Items Addressed This Visit       Other   Pre-op evaluation - Primary   Relevant Orders   DG Chest 2 View   Ambulatory referral to Cardiology    No orders of the defined types were placed in this encounter.   Follow-up: Return follow up for previously scheduled appointment..  Return fasting as previously requested for fasting lipid profile.  Has not seen cardiologist in 3  years.  Have asked for cardiology evaluation.  Has established mild to moderate vascular disease in the heart but blood pressure and LDL cholesterol are well controlled.  An echo cardiogram is scheduled.  [name], Donaldson

## 2021-02-11 ENCOUNTER — Other Ambulatory Visit: Payer: Self-pay | Admitting: Family Medicine

## 2021-02-11 NOTE — Telephone Encounter (Signed)
LM for pt to schedule appt °

## 2021-02-13 ENCOUNTER — Other Ambulatory Visit: Payer: Self-pay

## 2021-02-13 ENCOUNTER — Ambulatory Visit (INDEPENDENT_AMBULATORY_CARE_PROVIDER_SITE_OTHER): Payer: Medicare Other | Admitting: Endocrinology

## 2021-02-13 ENCOUNTER — Encounter: Payer: Self-pay | Admitting: Endocrinology

## 2021-02-13 VITALS — BP 130/80 | HR 58 | Ht 72.0 in | Wt 190.0 lb

## 2021-02-13 DIAGNOSIS — E1142 Type 2 diabetes mellitus with diabetic polyneuropathy: Secondary | ICD-10-CM

## 2021-02-13 DIAGNOSIS — Z794 Long term (current) use of insulin: Secondary | ICD-10-CM | POA: Diagnosis not present

## 2021-02-13 DIAGNOSIS — E1159 Type 2 diabetes mellitus with other circulatory complications: Secondary | ICD-10-CM

## 2021-02-13 LAB — POCT GLYCOSYLATED HEMOGLOBIN (HGB A1C): Hemoglobin A1C: 11.4 % — AB (ref 4.0–5.6)

## 2021-02-13 MED ORDER — METFORMIN HCL ER 500 MG PO TB24: 2000.0000 mg | ORAL_TABLET | Freq: Every day | ORAL | 3 refills | Status: DC

## 2021-02-13 MED ORDER — INSULIN GLARGINE-YFGN 100 UNIT/ML ~~LOC~~ SOPN: 80.0000 [IU] | PEN_INJECTOR | Freq: Every morning | SUBCUTANEOUS | 3 refills | Status: DC

## 2021-02-13 NOTE — Patient Instructions (Addendum)
I have sent a prescription to your pharmacy, to change the insulin to Semglee, 80 units each morning, and: Please continue the same metformin.   Here is a new meter.  We need to increase your insulin, but in order to to do so safely, we need your blood sugar information: check your blood sugar twice a day.  vary the time of day when you check, between before the 3 meals, and at bedtime.  also check if you have symptoms of your blood sugar being too high or too low.  please keep a record of the readings and bring it to your next appointment here (or you can bring the meter itself).  You can write it on any piece of paper.  please call us sooner if your blood sugar goes below 70, or if you have a lot of readings over 200.   Please come back for a follow-up appointment in 1 month.

## 2021-02-13 NOTE — Progress Notes (Signed)
Subjective:    Patient ID: Scott Donaldson, male    DOB: Nov 24, 1950, 70 y.o.   MRN: [id number]  HPI Pt returns for f/u of diabetes mellitus:  DM type: insulin-requiring type 2.   99991111 Complications: polyneuropathy and foot ulcer.  Therapy: insulin since 2010, and metformin.   DKA: never.  Severe hypoglycemia: never.  Pancreatitis: never.   Other: due to ongoing noncompliance, he is not a candidate for multiple daily injections; ins declined tresiba; he takes human insulin, due to cost; he was ref DM educ, to review inject technique, but he did not go.   Interval history: pt says cbg's have increased to the 200's since tamsulosin was stopped.  but he takes meds as rx'ed.  pt states he feels well in general.   Past Medical History:  Diagnosis Date   Abdominal pain 08/04/2016   Abnormal PSA 05/19/2015   Anemia    Arthralgia 11/12/2010   Benign prostatic hyperplasia with urinary hesitancy 06/14/2018   Cataract    Coronary artery disease    nonobstructive   Diabetes ([place]) 03/04/2007   Qualifier: Diagnosis of  By: Loanne Drilling MD, Sumedha Munnerlyn A    DIABETES MELLITUS, TYPE II 03/04/2007   Elevated LDL cholesterol level 06/14/2018   Foot ulcer, right ([place]) 04/15/2014   GERD (gastroesophageal reflux disease)    HOH (hard of hearing)    both ears no hearing aides   HYPERCHOLESTEROLEMIA 07/29/2008   HYPERTENSION 03/04/2007   Knee pain, left 05/16/2015   Nonintractable episodic headache 12/01/2016   migraine   SHOULDER PAIN, BILATERAL 01/12/2010   Qualifier: Diagnosis of  By: Loanne Drilling MD, Shadee Montoya A    TUBULOVILLOUS ADENOMA, 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   Qualifier: Diagnosis of  By: Loanne Drilling MD, Jacelyn Pi     Past Surgical History:  Procedure Laterality Date   COLONOSCOPY  07/2017   LEFT HEART CATH AND CORONARY ANGIOGRAPHY N/A 07/12/2018   Procedure: LEFT HEART CATH AND CORONARY ANGIOGRAPHY;  Surgeon: Wellington Hampshire, MD;  Location: [place] [facility];  Service: Cardiovascular;  Laterality: N/A;   stress cardiolite   05/30/2003   TRANSURETHRAL RESECTION OF PROSTATE N/A 06/04/2020   Procedure: TRANSURETHRAL RESECTION OF THE PROSTATE (TURP), BIPOLAR;  Surgeon: Ceasar Mons, MD;  Location: [facility];  Service: Urology;  Laterality: N/A;    Social History   Socioeconomic History   Marital status: Single    Spouse name: Not on file   Number of children: 2   Years of education: 10   Highest education level: Not on file  Occupational History   Occupation: roofer  Tobacco Use   Smoking status: Former    Packs/day: 1.50    Years: 25.00    Pack years: 37.50    Types: Cigarettes    Quit date: 07/26/1988    Years since quitting: 32.5   Smokeless tobacco: Never  Vaping Use   Vaping Use: Never used  Substance and Sexual Activity   Alcohol use: No   Drug use: No   Sexual activity: Not on file  Other Topics Concern   Not on file  Social History Narrative   Lives with daughter in a one story home.  Has 2 children.  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  Education: 10th grade.    Social Determinants of Health   Financial Resource Strain: Not on file  Food Insecurity: Not on file  Transportation Needs: Not on file  Physical Activity: Not on file  Stress: Not on file  Social  Connections: Not on file  Intimate Partner Violence: Not on file    Current Outpatient Medications on File Prior to Visit  Medication Sig Dispense Refill   amLODipine (NORVASC) 5 MG tablet Take 1 tablet (5 mg total) by mouth daily. 90 tablet 0   atorvastatin (LIPITOR) 40 MG tablet TAKE 1 TABLET(40 MG) BY MOUTH DAILY 90 tablet 3   calcium carbonate (TUMS - DOSED IN MG ELEMENTAL CALCIUM) 500 MG chewable tablet Chew 1 tablet by mouth as needed for indigestion or heartburn.     diclofenac Sodium (VOLTAREN) 1 % GEL Apply a small grape sized dollop to tender space of right knee up to 4 times daily. 150 g 1   ezetimibe (ZETIA) 10 MG tablet Take 1 tablet (10 mg total) by mouth daily. 90 tablet 3   glucose  blood test strip USE TO CHECK BLOOD SUGAR TWICE DAILY 100 each 12   Iron, Ferrous Sulfate, 325 (65 Fe) MG TABS Take one daily 90 tablet 4   metoprolol succinate (TOPROL-XL) 50 MG 24 hr tablet Take 1 tablet (50 mg total) by mouth at bedtime. Take with or immediately following a meal. 90 tablet 0   olmesartan (BENICAR) 40 MG tablet Take 1 tablet (40 mg total) by mouth daily. 90 tablet 3   vitamin B-12 (CYANOCOBALAMIN) 1000 MCG tablet Take 1 tablet (1,000 mcg total) by mouth daily. 90 tablet 1   No current facility-administered medications on file prior to visit.    No Known Allergies  Family History  Problem Relation Age of Onset   Cancer Mother        Breast Cancer   Cancer Father        uncertain type   Diabetes Father    Cancer Brother 71       Colon Cancer   Colon cancer Neg Hx    Rectal cancer Neg Hx    Stomach cancer Neg Hx     BP 130/80 (BP Location: Left Arm, Patient Position: Sitting, Cuff Size: Normal)   Pulse (!) 58   Ht 6' (1.829 m)   Wt 190 lb (86.2 kg)   SpO2 99%   BMI 25.77 kg/m    Review of Systems He denies hypoglycemia.      Objective:   Physical Exam Pulses: dorsalis pedis intact bilat.   MSK: no deformity of the feet CV: no leg edema Skin:  no ulcer on the feet.  normal color and temp on the feet. Neuro: sensation is intact to touch on the feet, but decreased from normal.  Ext: There is bilateral onychomycosis of the toenails.   Lab Results  Component Value Date   CREATININE 0.74 10/22/2020   BUN 12 10/22/2020   NA 141 10/22/2020   K 3.9 10/22/2020   CL 105 10/22/2020   CO2 27 10/22/2020    A1c=11.4%    Assessment & Plan:  Insulin-requiring type 2 DM: uncontrolled.    Patient Instructions  I have sent a prescription to your pharmacy, to change the insulin to Semglee, 80 units each morning, and: Please continue the same metformin.   Here is a new meter.  We need to increase your insulin, but in order to to do so safely, we need your  blood sugar information: check your blood sugar twice a day.  vary the time of day when you check, between before the 3 meals, and at bedtime.  also check if you have symptoms of your blood sugar being too high or too low.  please keep a record of the readings and bring it to your next appointment here (or you can bring the meter itself).  You can write it on any piece of paper.  please call us sooner if your blood sugar goes below 70, or if you have a lot of readings over 200.   Please come back for a follow-up appointment in 1 month.

## 2021-02-16 ENCOUNTER — Telehealth: Payer: Self-pay | Admitting: Pharmacy Technician

## 2021-02-16 NOTE — Telephone Encounter (Addendum)
Patient Advocate Encounter   Received notification from [place] that prior authorization for [facility] is required.   PA submitted on 02/16/2021 Key [id number] Status is DENIED  Preferred medications are: Levemir Flextouch Tyler Aas Flextouch Lantus  Provider informed.    [facility] will continue to follow.   Venida Jarvis. Nadara Mustard, CPhT Patient Advocate [facility] Phone: [phone] Fax:  [phone]

## 2021-02-17 ENCOUNTER — Other Ambulatory Visit: Payer: Self-pay | Admitting: Endocrinology

## 2021-02-17 MED ORDER — LANTUS SOLOSTAR 100 UNIT/ML ~~LOC~~ SOPN: 80.0000 [IU] | PEN_INJECTOR | SUBCUTANEOUS | 3 refills | Status: DC

## 2021-02-17 NOTE — Telephone Encounter (Signed)
-----   Message from Orpah Melter, CPhT sent at 02/17/2021 10:57 AM EDT ----- Regarding: RE: Change med? I called the plan and they state that Lantus will be covered  ----- Message ----- From: Renato Shin, MD Sent: 02/17/2021   9:54 AM EDT To: Orpah Melter, CPhT Subject: RE: Change med?                                I would prefer to change to another brand of glargine (Lantus or Basaglar).  Are these OK? ----- Message ----- From: Orpah Melter, CPhT Sent: 02/17/2021   7:36 AM EDT To: Renato Shin, MD Subject: Change med?                                    Hello Dr. Loanne Drilling,  This patient's Semglee prior Josem Kaufmann was denied.  Preferred medications are: Levemir Flextouch and Tyler Aas Flextouch  If a change is okay for the patient, I will file away.  If not, an appeal will need to be started.  Thank you,  Venida Jarvis. Nadara Mustard, CPhT Patient Advocate [facility] Phone: [phone] Fax:  [phone]

## 2021-02-17 NOTE — Telephone Encounter (Signed)
OK, I have sent a prescription to your pharmacy.  Thanks.

## 2021-02-18 ENCOUNTER — Ambulatory Visit (HOSPITAL_COMMUNITY): Payer: Medicare Other | Attending: Cardiology

## 2021-02-18 ENCOUNTER — Other Ambulatory Visit: Payer: Self-pay

## 2021-02-18 DIAGNOSIS — I447 Left bundle-branch block, unspecified: Secondary | ICD-10-CM | POA: Diagnosis not present

## 2021-02-18 DIAGNOSIS — I10 Essential (primary) hypertension: Secondary | ICD-10-CM | POA: Diagnosis not present

## 2021-02-18 DIAGNOSIS — R011 Cardiac murmur, unspecified: Secondary | ICD-10-CM | POA: Diagnosis not present

## 2021-02-18 LAB — ECHOCARDIOGRAM COMPLETE
AR max vel: 2.28 cm2
AV Area VTI: 2.41 cm2
AV Area mean vel: 2.07 cm2
AV Mean grad: 9 mmHg
AV Peak grad: 16.9 mmHg
Ao pk vel: 2.06 m/s
Area-P 1/2: 2.78 cm2
S' Lateral: 3.2 cm

## 2021-02-27 ENCOUNTER — Other Ambulatory Visit: Payer: Medicare Other | Admitting: *Deleted

## 2021-02-27 ENCOUNTER — Other Ambulatory Visit: Payer: Self-pay

## 2021-02-27 DIAGNOSIS — I10 Essential (primary) hypertension: Secondary | ICD-10-CM

## 2021-02-27 DIAGNOSIS — I447 Left bundle-branch block, unspecified: Secondary | ICD-10-CM

## 2021-02-27 DIAGNOSIS — R011 Cardiac murmur, unspecified: Secondary | ICD-10-CM | POA: Diagnosis not present

## 2021-02-27 LAB — HEPATIC FUNCTION PANEL
ALT: 12 IU/L (ref 0–44)
AST: 14 IU/L (ref 0–40)
Albumin: 4.6 g/dL (ref 3.8–4.8)
Alkaline Phosphatase: 75 IU/L (ref 44–121)
Bilirubin Total: 0.3 mg/dL (ref 0.0–1.2)
Bilirubin, Direct: <0.1 mg/dL (ref 0.00–0.40)
Total Protein: 7 g/dL (ref 6.0–8.5)

## 2021-02-27 LAB — LIPID PANEL
Chol/HDL Ratio: 5 ratio (ref 0.0–5.0)
Cholesterol, Total: 166 mg/dL (ref 100–199)
HDL: 33 mg/dL — ABNORMAL LOW (ref >39)
LDL Chol Calc (NIH): 93 mg/dL (ref 0–99)
Triglycerides: 233 mg/dL — ABNORMAL HIGH (ref 0–149)
VLDL Cholesterol Cal: 40 mg/dL (ref 5–40)

## 2021-03-05 ENCOUNTER — Telehealth: Payer: Self-pay

## 2021-03-05 NOTE — Progress Notes (Signed)
Chronic Care Management Pharmacy Assistant   Name: Scott Donaldson  MRN: [id number] DOB: [birth year]  Reason for Encounter:Hypertension Disease State Call.  Recent office visits:  02/09/2021 Dr.Kremer MD (PCP)No Medication Changes noted, Ambulatory referral to Cardiology 01/22/2021 Dr.Kremer MD (PCP)  Start Norvasc 5 mg ,start B12 supplement   Recent consult visits:  02/17/2021 Dr.Ellison MD (Endocrinology) Change Semglee to Lantus 80 units daily in the morning due to insurance coverage  02/13/2021 Dr.Ellison MD (endocrinology) Change Insulin Semglee 80 units each morning, Follow up with Endocrinology in one month 01/29/2021 [name] NP (Cardiology) Start Zetia 10 mg daily  Hospital visits:  None in previous 6 months  Medications: Outpatient Encounter Medications as of 03/05/2021  Medication Sig   insulin glargine (LANTUS SOLOSTAR) 100 UNIT/ML Solostar Pen Inject 80 Units into the skin every morning. And pen needles 1/day   amLODipine (NORVASC) 5 MG tablet Take 1 tablet (5 mg total) by mouth daily.   atorvastatin (LIPITOR) 40 MG tablet TAKE 1 TABLET(40 MG) BY MOUTH DAILY   calcium carbonate (TUMS - DOSED IN MG ELEMENTAL CALCIUM) 500 MG chewable tablet Chew 1 tablet by mouth as needed for indigestion or heartburn.   diclofenac Sodium (VOLTAREN) 1 % GEL Apply a small grape sized dollop to tender space of right knee up to 4 times daily.   ezetimibe (ZETIA) 10 MG tablet Take 1 tablet (10 mg total) by mouth daily.   glucose blood test strip USE TO CHECK BLOOD SUGAR TWICE DAILY   Iron, Ferrous Sulfate, 325 (65 Fe) MG TABS Take one daily   metFORMIN (GLUCOPHAGE-XR) 500 MG 24 hr tablet Take 4 tablets (2,000 mg total) by mouth daily.   metoprolol succinate (TOPROL-XL) 50 MG 24 hr tablet Take 1 tablet (50 mg total) by mouth at bedtime. Take with or immediately following a meal.   olmesartan (BENICAR) 40 MG tablet Take 1 tablet (40 mg total) by mouth daily.   vitamin B-12  (CYANOCOBALAMIN) 1000 MCG tablet Take 1 tablet (1,000 mcg total) by mouth daily.   No facility-administered encounter medications on file as of 03/05/2021.    Care Gaps: Shingrix Vaccine Ophthalmology Exam Star Rating Drugs: Atorvastatin 40 mg last filled on 02/11/2021 for 90 day supply at [facility]. Metformin 500 mg last filled on 02/13/2021 for 90 day supply at [facility]. Olmesartan 40 mg last filled on 01/12/2021 for 90 day supply at [facility]. Medication Fill Gaps: None ID  Reviewed chart prior to disease state call. Spoke with patient regarding BP  Recent Office Vitals: BP Readings from Last 3 Encounters:  02/13/21 130/80  02/09/21 102/68  01/29/21 124/72   Pulse Readings from Last 3 Encounters:  02/13/21 (!) 58  02/09/21 67  01/29/21 (!) 56    Wt Readings from Last 3 Encounters:  02/13/21 190 lb (86.2 kg)  02/09/21 190 lb 9.6 oz (86.5 kg)  01/29/21 196 lb 9.6 oz (89.2 kg)     Kidney Function Lab Results  Component Value Date/Time   CREATININE 0.74 10/22/2020 09:51 AM   CREATININE 0.82 06/05/2020 05:30 AM   GFR 91.85 10/22/2020 09:51 AM   GFRNONAA >60 06/05/2020 05:30 AM   GFRAA >60 04/06/2020 10:02 PM    BMP Latest Ref Rng & Units 10/22/2020 06/05/2020 06/02/2020  Glucose 70 - 99 mg/dL 188(H) 197(H) 315(H)  BUN 6 - 23 mg/dL '12 20 18  '$ Creatinine 0.40 - 1.50 mg/dL 0.74 0.82 0.91  BUN/Creat Ratio 10 - 24 - - -  Sodium 135 -  145 mEq/L 141 135 137  Potassium 3.5 - 5.1 mEq/L 3.9 4.4 4.3  Chloride 96 - 112 mEq/L 105 105 105  CO2 19 - 32 mEq/L '27 22 22  '$ Calcium 8.4 - 10.5 mg/dL 9.0 8.4(L) 9.1    Current antihypertensive regimen:  Olmesartan 40 mg 1 tablet daily Metoprolol 50 mg 1 tablet daily at bedtime Amlodipine 5 mg 1 tablet daily  Patient daughter states patient denies headaches,dizziness,lightheadedness or any issue and side effects from Olmesartan,Metoprolol or Amlodipine.   How often are you checking your Blood Pressure?  daily Current home BP readings:  Patient daughter report patient blood pressure has been ranging from 120-135/70's. What recent interventions/DTPs have been made by any provider to improve Blood Pressure control since last CPP Visit:  01/22/2021 Dr.Kremer MD (PCP)  Start Amlodipine 5 mg  Any recent hospitalizations or ED visits since last visit with CPP? No What diet changes have been made to improve Blood Pressure Control?  Patient daughter reports she is trying to work on him limiting the amount of salt and sodium intake. What exercise is being done to improve your Blood Pressure Control?  Patient daughter states patient is not exercising.   Adherence Review: Is the patient currently on ACE/ARB medication? Yes Does the patient have >5 day gap between last estimated fill dates? No   Anderson Malta Clinical Production designer, theatre/television/film [phone]

## 2021-03-12 ENCOUNTER — Other Ambulatory Visit: Payer: Self-pay

## 2021-03-12 ENCOUNTER — Ambulatory Visit: Payer: Medicare Other | Admitting: Endocrinology

## 2021-03-12 ENCOUNTER — Encounter: Payer: Self-pay | Admitting: Family Medicine

## 2021-03-12 DIAGNOSIS — I10 Essential (primary) hypertension: Secondary | ICD-10-CM

## 2021-03-12 MED ORDER — METOPROLOL SUCCINATE ER 50 MG PO TB24: 50.0000 mg | ORAL_TABLET | Freq: Every day | ORAL | 3 refills | Status: DC

## 2021-04-13 ENCOUNTER — Telehealth: Payer: Self-pay

## 2021-04-13 NOTE — Progress Notes (Signed)
Chronic Care Management Pharmacy Assistant   Name: Scott Donaldson  MRN: [id number] DOB: Dec 15, 1950  Reason for Encounter:Hypertension Disease State Call.   Recent office visits:  No recent Office Visit  Recent consult visits:  No recent [facility] visits:  None in previous 6 months  Medications: Outpatient Encounter Medications as of 04/13/2021  Medication Sig   insulin glargine (LANTUS SOLOSTAR) 100 UNIT/ML Solostar Pen Inject 80 Units into the skin every morning. And pen needles 1/day   amLODipine (NORVASC) 5 MG tablet Take 1 tablet (5 mg total) by mouth daily.   atorvastatin (LIPITOR) 40 MG tablet TAKE 1 TABLET(40 MG) BY MOUTH DAILY   calcium carbonate (TUMS - DOSED IN MG ELEMENTAL CALCIUM) 500 MG chewable tablet Chew 1 tablet by mouth as needed for indigestion or heartburn.   diclofenac Sodium (VOLTAREN) 1 % GEL Apply a small grape sized dollop to tender space of right knee up to 4 times daily.   ezetimibe (ZETIA) 10 MG tablet Take 1 tablet (10 mg total) by mouth daily.   glucose blood test strip USE TO CHECK BLOOD SUGAR TWICE DAILY   Iron, Ferrous Sulfate, 325 (65 Fe) MG TABS Take one daily   metFORMIN (GLUCOPHAGE-XR) 500 MG 24 hr tablet Take 4 tablets (2,000 mg total) by mouth daily.   metoprolol succinate (TOPROL-XL) 50 MG 24 hr tablet Take 1 tablet (50 mg total) by mouth at bedtime. Take with or immediately following a meal.   olmesartan (BENICAR) 40 MG tablet Take 1 tablet (40 mg total) by mouth daily.   vitamin B-12 (CYANOCOBALAMIN) 1000 MCG tablet Take 1 tablet (1,000 mcg total) by mouth daily.   No facility-administered encounter medications on file as of 04/13/2021.    Care Gaps: Shingrix Vaccine Ophthalmology Exam Star Rating Drugs: Atorvastatin 40 mg last filled on 02/11/2021 for 90 day supply at [facility]. Metformin 500 mg last filled on 02/13/2021 for 90 day supply at [facility]. Olmesartan 40 mg last filled on 01/12/2021  for 90 day supply at [facility]. Medication Fill Gaps: None ID  Reviewed chart prior to disease state call. Spoke with patient regarding BP  Recent Office Vitals: BP Readings from Last 3 Encounters:  02/13/21 130/80  02/09/21 102/68  01/29/21 124/72   Pulse Readings from Last 3 Encounters:  02/13/21 (!) 58  02/09/21 67  01/29/21 (!) 56    Wt Readings from Last 3 Encounters:  02/13/21 190 lb (86.2 kg)  02/09/21 190 lb 9.6 oz (86.5 kg)  01/29/21 196 lb 9.6 oz (89.2 kg)     Kidney Function Lab Results  Component Value Date/Time   CREATININE 0.74 10/22/2020 09:51 AM   CREATININE 0.82 06/05/2020 05:30 AM   GFR 91.85 10/22/2020 09:51 AM   GFRNONAA >60 06/05/2020 05:30 AM   GFRAA >60 04/06/2020 10:02 PM    BMP Latest Ref Rng & Units 10/22/2020 06/05/2020 06/02/2020  Glucose 70 - 99 mg/dL 188(H) 197(H) 315(H)  BUN 6 - 23 mg/dL '12 20 18  '$ Creatinine 0.40 - 1.50 mg/dL 0.74 0.82 0.91  BUN/Creat Ratio 10 - 24 - - -  Sodium 135 - 145 mEq/L 141 135 137  Potassium 3.5 - 5.1 mEq/L 3.9 4.4 4.3  Chloride 96 - 112 mEq/L 105 105 105  CO2 19 - 32 mEq/L '27 22 22  '$ Calcium 8.4 - 10.5 mg/dL 9.0 8.4(L) 9.1    Current antihypertensive regimen:  Olmesartan 40 mg 1 tablet daily Metoprolol 50 mg 1 tablet daily at  bedtime Amlodipine 5 mg 1 tablet daily   Patient daughter reports patient does not always inform her when he is not feeling well, but patient denies headaches,dizziness,lightheadedness or any issue and side effects from Olmesartan,Metoprolol or Amlodipine.   How often are you checking your Blood Pressure? infrequently Current home BP readings:  Patient daughter states she just started working 12 hours shift, and hasn't check his blood pressure lately.Patient daughter reports she has the next three days off and will begin checking his blood pressure on the days she is off.Patient daughter inform me, she will have his blood pressure log ready when I reach back out to them in a  month.  What recent interventions/DTPs have been made by any provider to improve Blood Pressure control since last CPP Visit: None ID Any recent hospitalizations or ED visits since last visit with CPP? No What diet changes have been made to improve Blood Pressure Control?  Patient daughter reports she is trying work with him on limiting the amount of salt and sodium intake. What exercise is being done to improve your Blood Pressure Control?  Patient daughter states patient is not exercising.   Adherence Review: Is the patient currently on ACE/ARB medication? Yes Does the patient have >5 day gap between last estimated fill dates? No   Anderson Malta Clinical Production designer, theatre/television/film [phone]

## 2021-04-24 ENCOUNTER — Other Ambulatory Visit: Payer: Self-pay | Admitting: Family Medicine

## 2021-04-24 DIAGNOSIS — I10 Essential (primary) hypertension: Secondary | ICD-10-CM

## 2021-06-12 ENCOUNTER — Telehealth: Payer: Self-pay

## 2021-06-12 NOTE — Progress Notes (Signed)
Chronic Care Management Pharmacy Assistant   Name: Scott Donaldson  MRN: [id number] DOB: Oct 26, 1950  Reason for Encounter:Hypertension Disease State Call.   Recent office visits:  No recent office visit  Recent consult visits:  No recent consult visit  Hospital visits:  None in previous 6 months  Medications: Outpatient Encounter Medications as of 06/12/2021  Medication Sig   insulin glargine (LANTUS SOLOSTAR) 100 UNIT/ML Solostar Pen Inject 80 Units into the skin every morning. And pen needles 1/day   amLODipine (NORVASC) 5 MG tablet TAKE 1 TABLET(5 MG) BY MOUTH DAILY   atorvastatin (LIPITOR) 40 MG tablet TAKE 1 TABLET(40 MG) BY MOUTH DAILY   calcium carbonate (TUMS - DOSED IN MG ELEMENTAL CALCIUM) 500 MG chewable tablet Chew 1 tablet by mouth as needed for indigestion or heartburn.   diclofenac Sodium (VOLTAREN) 1 % GEL Apply a small grape sized dollop to tender space of right knee up to 4 times daily.   ezetimibe (ZETIA) 10 MG tablet Take 1 tablet (10 mg total) by mouth daily.   glucose blood test strip USE TO CHECK BLOOD SUGAR TWICE DAILY   Iron, Ferrous Sulfate, 325 (65 Fe) MG TABS Take one daily   metFORMIN (GLUCOPHAGE-XR) 500 MG 24 hr tablet Take 4 tablets (2,000 mg total) by mouth daily.   metoprolol succinate (TOPROL-XL) 50 MG 24 hr tablet Take 1 tablet (50 mg total) by mouth at bedtime. Take with or immediately following a meal.   olmesartan (BENICAR) 40 MG tablet Take 1 tablet (40 mg total) by mouth daily.   vitamin B-12 (CYANOCOBALAMIN) 1000 MCG tablet Take 1 tablet (1,000 mcg total) by mouth daily.   No facility-administered encounter medications on file as of 06/12/2021.   Care Gaps: Shingrix Vaccine Ophthalmology Exam Star Rating Drugs: Atorvastatin 40 mg last filled on 05/12/2021 for 90 day supply at [facility]. Metformin 500 mg last filled on 05/12/2021 for 90 day supply at [facility]. Olmesartan 40 mg last filled on 04/14/2021 for 90  day supply at [facility]. Medication Fill Gaps: None ID  Reviewed chart prior to disease state call. Spoke with patient regarding BP  Recent Office Vitals: BP Readings from Last 3 Encounters:  02/13/21 130/80  02/09/21 102/68  01/29/21 124/72   Pulse Readings from Last 3 Encounters:  02/13/21 (!) 58  02/09/21 67  01/29/21 (!) 56    Wt Readings from Last 3 Encounters:  02/13/21 190 lb (86.2 kg)  02/09/21 190 lb 9.6 oz (86.5 kg)  01/29/21 196 lb 9.6 oz (89.2 kg)     Kidney Function Lab Results  Component Value Date/Time   CREATININE 0.74 10/22/2020 09:51 AM   CREATININE 0.82 06/05/2020 05:30 AM   GFR 91.85 10/22/2020 09:51 AM   GFRNONAA >60 06/05/2020 05:30 AM   GFRAA >60 04/06/2020 10:02 PM    BMP Latest Ref Rng & Units 10/22/2020 06/05/2020 06/02/2020  Glucose 70 - 99 mg/dL 188(H) 197(H) 315(H)  BUN 6 - 23 mg/dL 12 20 18   Creatinine 0.40 - 1.50 mg/dL 0.74 0.82 0.91  BUN/Creat Ratio 10 - 24 - - -  Sodium 135 - 145 mEq/L 141 135 137  Potassium 3.5 - 5.1 mEq/L 3.9 4.4 4.3  Chloride 96 - 112 mEq/L 105 105 105  CO2 19 - 32 mEq/L 27 22 22   Calcium 8.4 - 10.5 mg/dL 9.0 8.4(L) 9.1    Current antihypertensive regimen:  Olmesartan 40 mg 1 tablet daily Metoprolol 50 mg 1 tablet daily at bedtime Amlodipine 5  mg 1 tablet daily  Patient daughter states patient denies headaches,dizziness,lightheadedness or any issue and side effects from Olmesartan,Metoprolol or Amlodipine.   How often are you checking your Blood Pressure? infrequently Current home BP readings:  Patient daughter states patient blood pressure has been ranging around 140's/50's.  What recent interventions/DTPs have been made by any provider to improve Blood Pressure control since last CPP Visit: None ID  Any recent hospitalizations or ED visits since last visit with CPP? No What diet changes have been made to improve Blood Pressure Control?  Patient daughter states she is working with him to have a  healthier diet.  What exercise is being done to improve your Blood Pressure Control?  Patient daughter states patient is not exercising.   Adherence Review: Is the patient currently on ACE/ARB medication? Yes Does the patient have >5 day gap between last estimated fill dates? No   Anderson Malta Clinical Production designer, theatre/television/film [phone]

## 2021-06-21 ENCOUNTER — Other Ambulatory Visit: Payer: Self-pay | Admitting: Family

## 2021-06-21 DIAGNOSIS — I10 Essential (primary) hypertension: Secondary | ICD-10-CM

## 2021-07-10 ENCOUNTER — Telehealth: Payer: Self-pay

## 2021-07-10 NOTE — Progress Notes (Signed)
Chronic Care Management Pharmacy Assistant   Name: Scott Donaldson  MRN: [id number] DOB: 03-Feb-1951    Reason for Encounter:Hypertension Disease State Call.   Recent office visits:  No recent office visit  Recent consult visits:  No recent consult visits  Hospital visits:  None in previous 6 months  Medications: Outpatient Encounter Medications as of 07/10/2021  Medication Sig   insulin glargine (LANTUS SOLOSTAR) 100 UNIT/ML Solostar Pen Inject 80 Units into the skin every morning. And pen needles 1/day   amLODipine (NORVASC) 5 MG tablet TAKE 1 TABLET(5 MG) BY MOUTH DAILY   atorvastatin (LIPITOR) 40 MG tablet TAKE 1 TABLET(40 MG) BY MOUTH DAILY   calcium carbonate (TUMS - DOSED IN MG ELEMENTAL CALCIUM) 500 MG chewable tablet Chew 1 tablet by mouth as needed for indigestion or heartburn.   diclofenac Sodium (VOLTAREN) 1 % GEL Apply a small grape sized dollop to tender space of right knee up to 4 times daily.   ezetimibe (ZETIA) 10 MG tablet Take 1 tablet (10 mg total) by mouth daily.   glucose blood test strip USE TO CHECK BLOOD SUGAR TWICE DAILY   Iron, Ferrous Sulfate, 325 (65 Fe) MG TABS Take one daily   metFORMIN (GLUCOPHAGE-XR) 500 MG 24 hr tablet Take 4 tablets (2,000 mg total) by mouth daily.   metoprolol succinate (TOPROL-XL) 50 MG 24 hr tablet Take 1 tablet (50 mg total) by mouth at bedtime. Take with or immediately following a meal.   olmesartan (BENICAR) 40 MG tablet Take 1 tablet (40 mg total) by mouth daily.   vitamin B-12 (CYANOCOBALAMIN) 1000 MCG tablet Take 1 tablet (1,000 mcg total) by mouth daily.   No facility-administered encounter medications on file as of 07/10/2021.    Care Gaps: Shingrix Vaccine Ophthalmology Exam Star Rating Drugs: Atorvastatin 40 mg last filled on 05/12/2021 for 90 day supply at [facility]. Metformin 500 mg last filled on 05/12/2021 for 90 day supply at [facility]. Olmesartan 40 mg last filled on 04/14/2021  for 90 day supply at [facility]. Medication Fill Gaps: None ID  Reviewed chart prior to disease state call. Spoke with patient regarding BP  Recent Office Vitals: BP Readings from Last 3 Encounters:  02/13/21 130/80  02/09/21 102/68  01/29/21 124/72   Pulse Readings from Last 3 Encounters:  02/13/21 (!) 58  02/09/21 67  01/29/21 (!) 56    Wt Readings from Last 3 Encounters:  02/13/21 190 lb (86.2 kg)  02/09/21 190 lb 9.6 oz (86.5 kg)  01/29/21 196 lb 9.6 oz (89.2 kg)     Kidney Function Lab Results  Component Value Date/Time   CREATININE 0.74 10/22/2020 09:51 AM   CREATININE 0.82 06/05/2020 05:30 AM   GFR 91.85 10/22/2020 09:51 AM   GFRNONAA >60 06/05/2020 05:30 AM   GFRAA >60 04/06/2020 10:02 PM    BMP Latest Ref Rng & Units 10/22/2020 06/05/2020 06/02/2020  Glucose 70 - 99 mg/dL 188(H) 197(H) 315(H)  BUN 6 - 23 mg/dL 12 20 18   Creatinine 0.40 - 1.50 mg/dL 0.74 0.82 0.91  BUN/Creat Ratio 10 - 24 - - -  Sodium 135 - 145 mEq/L 141 135 137  Potassium 3.5 - 5.1 mEq/L 3.9 4.4 4.3  Chloride 96 - 112 mEq/L 105 105 105  CO2 19 - 32 mEq/L 27 22 22   Calcium 8.4 - 10.5 mg/dL 9.0 8.4(L) 9.1    Current antihypertensive regimen:  Olmesartan 40 mg 1 tablet daily Metoprolol 50 mg 1 tablet daily at  bedtime Amlodipine 5 mg 1 tablet daily  Patient daughter states patient denies headaches,dizziness,lightheadedness or any issue and side effects from Olmesartan,Metoprolol or Amlodipine  How often are you checking your Blood Pressure? infrequently Current home BP readings:  None ID What recent interventions/DTPs have been made by any provider to improve Blood Pressure control since last CPP Visit: None ID Any recent hospitalizations or ED visits since last visit with CPP? No What diet changes have been made to improve Blood Pressure Control?  Patient daughter denies ann changes to his diet.  What exercise is being done to improve your Blood Pressure Control?  Patient  daughter states patient is not exercising.   Adherence Review: Is the patient currently on ACE/ARB medication? Yes Does the patient have >5 day gap between last estimated fill dates? No   Anderson Malta Clinical Production designer, theatre/television/film [phone]

## 2021-07-29 ENCOUNTER — Ambulatory Visit (INDEPENDENT_AMBULATORY_CARE_PROVIDER_SITE_OTHER): Payer: Medicare Other | Admitting: Family Medicine

## 2021-07-29 ENCOUNTER — Encounter: Payer: Self-pay | Admitting: Family Medicine

## 2021-07-29 ENCOUNTER — Other Ambulatory Visit: Payer: Self-pay

## 2021-07-29 ENCOUNTER — Ambulatory Visit (INDEPENDENT_AMBULATORY_CARE_PROVIDER_SITE_OTHER): Payer: Medicare Other

## 2021-07-29 VITALS — BP 134/68 | HR 50 | Temp 98.1°F | Ht 72.0 in | Wt 194.2 lb

## 2021-07-29 DIAGNOSIS — R0789 Other chest pain: Secondary | ICD-10-CM

## 2021-07-29 DIAGNOSIS — R1031 Right lower quadrant pain: Secondary | ICD-10-CM

## 2021-07-29 DIAGNOSIS — K5901 Slow transit constipation: Secondary | ICD-10-CM | POA: Diagnosis not present

## 2021-07-29 DIAGNOSIS — R1011 Right upper quadrant pain: Secondary | ICD-10-CM

## 2021-07-29 LAB — BASIC METABOLIC PANEL
BUN: 17 mg/dL (ref 6–23)
CO2: 26 mEq/L (ref 19–32)
Calcium: 9.1 mg/dL (ref 8.4–10.5)
Chloride: 102 mEq/L (ref 96–112)
Creatinine, Ser: 0.95 mg/dL (ref 0.40–1.50)
GFR: 80.7 mL/min (ref >60.00)
Glucose, Bld: 221 mg/dL — ABNORMAL HIGH (ref 70–99)
Potassium: 4.6 mEq/L (ref 3.5–5.1)
Sodium: 135 mEq/L (ref 135–145)

## 2021-07-29 LAB — URINALYSIS, ROUTINE W REFLEX MICROSCOPIC
Bilirubin Urine: NEGATIVE
Hgb urine dipstick: NEGATIVE
Ketones, ur: NEGATIVE
Leukocytes,Ua: NEGATIVE
Nitrite: NEGATIVE
Specific Gravity, Urine: >=1.03 — AB (ref 1.000–1.030)
Total Protein, Urine: 100 — AB
Urine Glucose: >=1000 — AB
Urobilinogen, UA: 0.2 (ref 0.0–1.0)
pH: 5.5 (ref 5.0–8.0)

## 2021-07-29 LAB — HEPATIC FUNCTION PANEL
ALT: 13 U/L (ref 0–53)
AST: 15 U/L (ref 0–37)
Albumin: 4.3 g/dL (ref 3.5–5.2)
Alkaline Phosphatase: 62 U/L (ref 39–117)
Bilirubin, Direct: 0.1 mg/dL (ref 0.0–0.3)
Total Bilirubin: 0.7 mg/dL (ref 0.2–1.2)
Total Protein: 7.2 g/dL (ref 6.0–8.3)

## 2021-07-29 LAB — CBC
HCT: 39.7 % (ref 39.0–52.0)
Hemoglobin: 13.3 g/dL (ref 13.0–17.0)
MCHC: 33.5 g/dL (ref 30.0–36.0)
MCV: 85.7 fl (ref 78.0–100.0)
Platelets: 201 10*3/uL (ref 150.0–400.0)
RBC: 4.64 Mil/uL (ref 4.22–5.81)
RDW: 13.2 % (ref 11.5–15.5)
WBC: 5.4 10*3/uL (ref 4.0–10.5)

## 2021-07-29 LAB — AMYLASE: Amylase: 30 U/L (ref 27–131)

## 2021-07-29 MED ORDER — POLYETHYLENE GLYCOL 3350 17 GM/SCOOP PO POWD: 17.0000 g | Freq: Two times a day (BID) | ORAL | 1 refills | Status: DC | PRN

## 2021-07-29 MED ORDER — DOCUSATE SODIUM 100 MG PO CAPS: 100.0000 mg | ORAL_CAPSULE | Freq: Two times a day (BID) | ORAL | 0 refills | Status: DC

## 2021-07-29 NOTE — Progress Notes (Signed)
Established Patient Office Visit  Subjective:  Patient ID: Scott Donaldson, male    DOB: January 08, 1951  Age: 70 y.o. MRN: [id number]  CC:  Chief Complaint  Patient presents with   Pain    Lower right side/back pain come and go x 1-2 months sometimes tender to touch.     HPI Scott Donaldson presents for evaluation and treatment of 1 to 12-month history of right sided abdominal chest pain.  Pain is located between the right lower chest wall and the right lower abdominal area.  Pain in the chest wall area is worsened by movement through his chest and spine.  It will gravity catch when he rolls over in bed or moves a certain way while walking.  He stools every 2 to 3 days.  He has no difficulty with urination.  Denies nausea or vomiting shortness of breath diaphoresis or DOE.  Was unable to go for his knee replacement secondary to multiple medical issues.  Follow-up with urology for BPH is coming up.  Past Medical History:  Diagnosis Date   Abdominal pain 08/04/2016   Abnormal PSA 05/19/2015   Anemia    Arthralgia 11/12/2010   Benign prostatic hyperplasia with urinary hesitancy 06/14/2018   Cataract    Coronary artery disease    nonobstructive   Diabetes ([place]) 03/04/2007   Qualifier: Diagnosis of  By: Loanne Drilling MD, Sean A    DIABETES MELLITUS, TYPE II 03/04/2007   Elevated LDL cholesterol level 06/14/2018   Foot ulcer, right ([place]) 04/15/2014   GERD (gastroesophageal reflux disease)    HOH (hard of hearing)    both ears no hearing aides   HYPERCHOLESTEROLEMIA 07/29/2008   HYPERTENSION 03/04/2007   Knee pain, left 05/16/2015   Nonintractable episodic headache 12/01/2016   migraine   SHOULDER PAIN, BILATERAL 01/12/2010   Qualifier: Diagnosis of  By: Loanne Drilling MD, Sean A    TUBULOVILLOUS ADENOMA, 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   Qualifier: Diagnosis of  By: Loanne Drilling MD, Jacelyn Pi     Past Surgical History:  Procedure Laterality Date   COLONOSCOPY  07/2017   LEFT HEART CATH AND CORONARY ANGIOGRAPHY N/A 07/12/2018    Procedure: LEFT HEART CATH AND CORONARY ANGIOGRAPHY;  Surgeon: Wellington Hampshire, MD;  Location: [facility];  Service: Cardiovascular;  Laterality: N/A;   stress cardiolite  05/30/2003   TRANSURETHRAL RESECTION OF PROSTATE N/A 06/04/2020   Procedure: TRANSURETHRAL RESECTION OF THE PROSTATE (TURP), BIPOLAR;  Surgeon: Ceasar Mons, MD;  Location: [facility];  Service: Urology;  Laterality: N/A;    Family History  Problem Relation Age of Onset   Cancer Mother        Breast Cancer   Cancer Father        uncertain type   Diabetes Father    Cancer Brother 39       Colon Cancer   Colon cancer Neg Hx    Rectal cancer Neg Hx    Stomach cancer Neg Hx     Social History   Socioeconomic History   Marital status: Single    Spouse name: Not on file   Number of children: 2   Years of education: 10   Highest education level: Not on file  Occupational History   Occupation: roofer  Tobacco Use   Smoking status: Former    Packs/day: 1.50    Years: 25.00    Pack years: 37.50    Types: Cigarettes    Quit date: 07/26/1988  Years since quitting: 33.0   Smokeless tobacco: Never  Vaping Use   Vaping Use: Never used  Substance and Sexual Activity   Alcohol use: No   Drug use: No   Sexual activity: Not on file  Other Topics Concern   Not on file  Social History Narrative   Lives with daughter in a one story home.  Has 2 children.  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  Education: 10th grade.    Social Determinants of Health   Financial Resource Strain: Not on file  Food Insecurity: Not on file  Transportation Needs: Not on file  Physical Activity: Not on file  Stress: Not on file  Social Connections: Not on file  Intimate Partner Violence: Not on file    Outpatient Medications Prior to Visit  Medication Sig Dispense Refill   amLODipine (NORVASC) 5 MG tablet TAKE 1 TABLET(5 MG) BY MOUTH DAILY 90 tablet 0   atorvastatin (LIPITOR) 40 MG tablet  TAKE 1 TABLET(40 MG) BY MOUTH DAILY 90 tablet 3   calcium carbonate (TUMS - DOSED IN MG ELEMENTAL CALCIUM) 500 MG chewable tablet Chew 1 tablet by mouth as needed for indigestion or heartburn.     diclofenac Sodium (VOLTAREN) 1 % GEL Apply a small grape sized dollop to tender space of right knee up to 4 times daily. 150 g 1   glucose blood test strip USE TO CHECK BLOOD SUGAR TWICE DAILY 100 each 12   insulin glargine (LANTUS SOLOSTAR) 100 UNIT/ML Solostar Pen Inject 80 Units into the skin every morning. And pen needles 1/day 90 mL 3   Iron, Ferrous Sulfate, 325 (65 Fe) MG TABS Take one daily 90 tablet 4   metFORMIN (GLUCOPHAGE-XR) 500 MG 24 hr tablet Take 4 tablets (2,000 mg total) by mouth daily. 360 tablet 3   metoprolol succinate (TOPROL-XL) 50 MG 24 hr tablet Take 1 tablet (50 mg total) by mouth at bedtime. Take with or immediately following a meal. 90 tablet 3   olmesartan (BENICAR) 40 MG tablet Take 1 tablet (40 mg total) by mouth daily. 90 tablet 3   vitamin B-12 (CYANOCOBALAMIN) 1000 MCG tablet Take 1 tablet (1,000 mcg total) by mouth daily. 90 tablet 1   ezetimibe (ZETIA) 10 MG tablet Take 1 tablet (10 mg total) by mouth daily. (Patient not taking: Reported on 07/29/2021) 90 tablet 3   meloxicam (MOBIC) 15 MG tablet Take 15 mg by mouth daily.     No facility-administered medications prior to visit.    No Known Allergies  ROS Review of Systems  Constitutional:  Negative for diaphoresis, fatigue, fever and unexpected weight change.  HENT: Negative.    Eyes:  Negative for photophobia and visual disturbance.  Respiratory:  Negative for chest tightness, shortness of breath and wheezing.   Cardiovascular:  Positive for chest pain. Negative for palpitations and leg swelling.  Gastrointestinal:  Positive for abdominal pain and constipation. Negative for anal bleeding, blood in stool, diarrhea, nausea and vomiting.  Genitourinary:  Negative for difficulty urinating, frequency and urgency.   Musculoskeletal:  Negative for gait problem and joint swelling.  Neurological:  Negative for speech difficulty and weakness.  Psychiatric/Behavioral: Negative.       Objective:    Physical Exam Vitals and nursing note reviewed.  Constitutional:      General: He is not in acute distress.    Appearance: Normal appearance. He is not ill-appearing, toxic-appearing or diaphoretic.  HENT:     Head: Normocephalic and atraumatic.  Right Ear: Tympanic membrane, ear canal and external ear normal.     Left Ear: Tympanic membrane, ear canal and external ear normal.     Mouth/Throat:     Mouth: Mucous membranes are moist.     Pharynx: Oropharynx is clear. No oropharyngeal exudate or posterior oropharyngeal erythema.  Eyes:     General: No scleral icterus.       Right eye: No discharge.        Left eye: No discharge.     Conjunctiva/sclera: Conjunctivae normal.     Pupils: Pupils are equal, round, and reactive to light.  Neck:     Vascular: No carotid bruit.  Cardiovascular:     Rate and Rhythm: Normal rate and regular rhythm.  Pulmonary:     Effort: Pulmonary effort is normal.     Breath sounds: Normal breath sounds.  Abdominal:     General: Bowel sounds are normal.     Tenderness: There is no abdominal tenderness. There is no right CVA tenderness or left CVA tenderness.  Musculoskeletal:     Cervical back: No rigidity or tenderness.  Lymphadenopathy:     Cervical: No cervical adenopathy.  Skin:    General: Skin is warm and dry.  Neurological:     Mental Status: He is alert and oriented to person, place, and time.  Psychiatric:        Mood and Affect: Mood normal.        Behavior: Behavior normal.    BP 134/68 (BP Location: Right Arm, Patient Position: Sitting, Cuff Size: Large)    Pulse (!) 50    Temp 98.1 F (36.7 C) (Temporal)    Ht 6' (1.829 m)    Wt 194 lb 3.2 oz (88.1 kg)    SpO2 97%    BMI 26.34 kg/m  Wt Readings from Last 3 Encounters:  07/29/21 194 lb 3.2 oz (88.1  kg)  02/13/21 190 lb (86.2 kg)  02/09/21 190 lb 9.6 oz (86.5 kg)     Health Maintenance Due  Topic Date Due   Zoster Vaccines- Shingrix (1 of 2) Never done   OPHTHALMOLOGY EXAM  12/16/2020    There are no preventive care reminders to display for this patient.  Lab Results  Component Value Date   TSH 1.18 08/04/2016   Lab Results  Component Value Date   WBC 5.4 01/22/2021   HGB 13.1 01/22/2021   HCT 36.8 (L) 01/22/2021   MCV 84.0 01/22/2021   PLT 150.0 01/22/2021   Lab Results  Component Value Date   NA 141 10/22/2020   K 3.9 10/22/2020   CO2 27 10/22/2020   GLUCOSE 188 (H) 10/22/2020   BUN 12 10/22/2020   CREATININE 0.74 10/22/2020   BILITOT 0.3 02/27/2021   ALKPHOS 75 02/27/2021   AST 14 02/27/2021   ALT 12 02/27/2021   PROT 7.0 02/27/2021   ALBUMIN 4.6 02/27/2021   CALCIUM 9.0 10/22/2020   ANIONGAP 8 06/05/2020   GFR 91.85 10/22/2020   Lab Results  Component Value Date   CHOL 166 02/27/2021   Lab Results  Component Value Date   HDL 33 (L) 02/27/2021   Lab Results  Component Value Date   LDLCALC 93 02/27/2021   Lab Results  Component Value Date   TRIG 233 (H) 02/27/2021   Lab Results  Component Value Date   CHOLHDL 5.0 02/27/2021   Lab Results  Component Value Date   HGBA1C 11.4 (A) 02/13/2021      Assessment &  Plan:   Problem List Items Addressed This Visit       Other   Right lower quadrant abdominal pain   Relevant Orders   CBC   Basic metabolic panel   Hepatic function panel   Amylase   Urinalysis, Routine w reflex microscopic   Constipation   Relevant Medications   docusate sodium (COLACE) 100 MG capsule   polyethylene glycol powder (GLYCOLAX/MIRALAX) 17 GM/SCOOP powder   Chest wall pain   Relevant Orders   DG Ribs Unilateral Right   DG Thoracic Spine W/Swimmers   Right upper quadrant abdominal pain - Primary   Relevant Orders   CBC   Basic metabolic panel   Hepatic function panel   Amylase   Urinalysis, Routine w  reflex microscopic   DG Thoracic Spine W/Swimmers    Meds ordered this encounter  Medications   docusate sodium (COLACE) 100 MG capsule    Sig: Take 1 capsule (100 mg total) by mouth 2 (two) times daily.    Dispense:  60 capsule    Refill:  0   polyethylene glycol powder (GLYCOLAX/MIRALAX) 17 GM/SCOOP powder    Sig: Take 17 g by mouth 2 (two) times daily as needed.    Dispense:  3350 g    Refill:  1    Follow-up: Return in about 3 weeks (around 08/19/2021).  Recent CT of abdomen and pelvis and September 2021 was negative for acute findings.  Question constipation.  Question thoracic neuropathy.  We will treat for constipation and follow-up in 2 to 3 weeks.  [name], MD

## 2021-08-07 ENCOUNTER — Telehealth: Payer: Self-pay | Admitting: Family Medicine

## 2021-08-07 NOTE — Telephone Encounter (Signed)
Left message for patient to call back and schedule Medicare Annual Wellness Visit (AWV) in office.   If not able to come in office, please offer to do virtually or by telephone.  Left office number and my jabber [phone].  Last AWV:08/04/2016  Please schedule at anytime with Nurse Health Advisor.

## 2021-08-18 ENCOUNTER — Other Ambulatory Visit: Payer: Self-pay | Admitting: Family Medicine

## 2021-08-18 DIAGNOSIS — I10 Essential (primary) hypertension: Secondary | ICD-10-CM

## 2021-08-21 ENCOUNTER — Other Ambulatory Visit: Payer: Self-pay

## 2021-08-21 ENCOUNTER — Ambulatory Visit: Payer: Medicare Other | Admitting: Endocrinology

## 2021-08-21 VITALS — BP 142/76 | HR 55 | Ht 72.0 in | Wt 193.0 lb

## 2021-08-21 DIAGNOSIS — E1142 Type 2 diabetes mellitus with diabetic polyneuropathy: Secondary | ICD-10-CM | POA: Diagnosis not present

## 2021-08-21 DIAGNOSIS — Z794 Long term (current) use of insulin: Secondary | ICD-10-CM | POA: Diagnosis not present

## 2021-08-21 LAB — POCT GLYCOSYLATED HEMOGLOBIN (HGB A1C): Hemoglobin A1C: 10.7 % — AB (ref 4.0–5.6)

## 2021-08-21 MED ORDER — GLUCOSE BLOOD VI STRP: 1.0000 | ORAL_STRIP | Freq: Two times a day (BID) | 3 refills | Status: AC

## 2021-08-21 MED ORDER — LANTUS SOLOSTAR 100 UNIT/ML ~~LOC~~ SOPN: 80.0000 [IU] | PEN_INJECTOR | SUBCUTANEOUS | 3 refills | Status: DC

## 2021-08-21 NOTE — Progress Notes (Signed)
Subjective:    Patient ID: Scott Donaldson, male    DOB: Dec 16, 1950, 71 y.o.   MRN: [id number]  HPI Pt returns for f/u of diabetes mellitus:  DM type: insulin-requiring type 2.   [id number] Complications: PN and foot ulcer.  Therapy: insulin since 2010, and metformin.   DKA: never.  Severe hypoglycemia: never.  Pancreatitis: never.   Other: due to ongoing noncompliance, he is not a candidate for multiple daily injections; ins declined tresiba; he takes human insulin, due to cost; he was ref DM educ, to review inject technique, but he did not go.   Interval history: pt says he takes meds as rx'ed.   Pt says he never misses the insulin.  no cbg record, but states cbg's vary from 90-500.  It is in general higher as the day goes on.  He takes Lantus, 35-BID.   Past Medical History:  Diagnosis Date   Abdominal pain 08/04/2016   Abnormal PSA 05/19/2015   Anemia    Arthralgia 11/12/2010   Benign prostatic hyperplasia with urinary hesitancy 06/14/2018   Cataract    Coronary artery disease    nonobstructive   Diabetes ([place]) 03/04/2007   Qualifier: Diagnosis of  By: Loanne Drilling MD, Graceanne Guin A    DIABETES MELLITUS, TYPE II 03/04/2007   Elevated LDL cholesterol level 06/14/2018   Foot ulcer, right ([place]) 04/15/2014   GERD (gastroesophageal reflux disease)    HOH (hard of hearing)    both ears no hearing aides   HYPERCHOLESTEROLEMIA 07/29/2008   HYPERTENSION 03/04/2007   Knee pain, left 05/16/2015   Nonintractable episodic headache 12/01/2016   migraine   SHOULDER PAIN, BILATERAL 01/12/2010   Qualifier: Diagnosis of  By: Loanne Drilling MD, Mollie Rossano A    TUBULOVILLOUS ADENOMA, 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;  Service: Urology;  Laterality: N/A;    Social History   Socioeconomic History   Marital status: Single    Spouse name: Not on file   Number of children: 2   Years of education: 10   Highest education level: Not on file  Occupational History   Occupation: roofer  Tobacco Use   Smoking status: Former    Packs/day: 1.50    Years: 25.00    Pack years: 37.50    Types: Cigarettes    Quit date: 07/26/1988    Years since quitting: 33.0   Smokeless tobacco: Never  Vaping Use   Vaping Use: Never used  Substance and Sexual Activity   Alcohol use: No   Drug use: No   Sexual activity: Not on file  Other Topics Concern   Not on file  Social History Narrative   Lives with daughter in a one story home.  Has 2 children.  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  Education: 10th grade.    Social Determinants of Health   Financial Resource Strain: Not on file  Food Insecurity: Not on file  Transportation Needs: Not  on file  Physical Activity: Not on file  Stress: Not on file  Social Connections: Not on file  Intimate Partner Violence: Not on file    Current Outpatient Medications on File Prior to Visit  Medication Sig Dispense Refill   amLODipine (NORVASC) 5 MG tablet TAKE 1 TABLET(5 MG) BY MOUTH DAILY 90 tablet 0   atorvastatin (LIPITOR) 40 MG tablet TAKE 1 TABLET(40 MG) BY MOUTH DAILY 90 tablet 3   calcium carbonate (TUMS - DOSED IN MG ELEMENTAL CALCIUM) 500 MG chewable tablet Chew 1 tablet by mouth as needed for indigestion or heartburn.     diclofenac Sodium (VOLTAREN) 1 % GEL Apply a small grape sized dollop to tender space of right knee up to 4 times daily. 150 g 1   docusate sodium (COLACE) 100 MG capsule Take 1 capsule  (100 mg total) by mouth 2 (two) times daily. 60 capsule 0   Iron, Ferrous Sulfate, 325 (65 Fe) MG TABS Take one daily 90 tablet 4   meloxicam (MOBIC) 15 MG tablet Take 15 mg by mouth daily.     metFORMIN (GLUCOPHAGE-XR) 500 MG 24 hr tablet Take 4 tablets (2,000 mg total) by mouth daily. 360 tablet 3   metoprolol succinate (TOPROL-XL) 50 MG 24 hr tablet Take 1 tablet (50 mg total) by mouth at bedtime. Take with or immediately following a meal. 90 tablet 3   olmesartan (BENICAR) 40 MG tablet Take 1 tablet (40 mg total) by mouth daily. 90 tablet 3   polyethylene glycol powder (GLYCOLAX/MIRALAX) 17 GM/SCOOP powder Take 17 g by mouth 2 (two) times daily as needed. 3350 g 1   vitamin B-12 (CYANOCOBALAMIN) 1000 MCG tablet Take 1 tablet (1,000 mcg total) by mouth daily. 90 tablet 1   ezetimibe (ZETIA) 10 MG tablet Take 1 tablet (10 mg total) by mouth daily. (Patient not taking: Reported on 07/29/2021) 90 tablet 3   No current facility-administered medications on file prior to visit.    No Known Allergies  Family History  Problem Relation Age of Onset   Cancer Mother        Breast Cancer   Cancer Father        uncertain type   Diabetes Father    Cancer Brother 28       Colon Cancer   Colon cancer Neg Hx    Rectal cancer Neg Hx    Stomach cancer Neg Hx     BP (!) 142/76    Pulse (!) 55    Ht 6' (1.829 m)    Wt 193 lb (87.5 kg)    SpO2 96%    BMI 26.18 kg/m    Review of Systems     Objective:   Physical Exam Pulses: dorsalis pedis intact bilat.   MSK: no deformity of the feet CV: no leg edema Skin:  no ulcer on the feet, but there are healy calluses  normal color and temp on the feet.  Neuro: sensation is intact to touch on the feet, but severely decreased from normal.   Ext: There is bilateral onychomycosis of the toenails.     Lab Results  Component Value Date   HGBA1C 10.7 (A) 08/21/2021      Assessment & Plan:  Insulin-requiring type 2 DM: uncontrolled  Patient  Instructions  Please increase increase the Lantus to 80 units each morning, and continue the same metformin.   Here is a new meter.  We need to increase your insulin, but in order to to  do so safely, we need your blood sugar information: check your blood sugar twice a day.  vary the time of day when you check, between before the 3 meals, and at bedtime.  also check if you have symptoms of your blood sugar being too high or too low.  please keep a record of the readings and bring it to your next appointment here (or you can bring the meter itself).  You can write it on any piece of paper.  please call us sooner if your blood sugar goes below 70, or if you have a lot of readings over 200.   Please come back for a follow-up appointment in 2 months.

## 2021-08-21 NOTE — Patient Instructions (Addendum)
Please increase increase the Lantus to 80 units each morning, and continue the same metformin.   Here is a new meter.  We need to increase your insulin, but in order to to do so safely, we need your blood sugar information: check your blood sugar twice a day.  vary the time of day when you check, between before the 3 meals, and at bedtime.  also check if you have symptoms of your blood sugar being too high or too low.  please keep a record of the readings and bring it to your next appointment here (or you can bring the meter itself).  You can write it on any piece of paper.  please call us sooner if your blood sugar goes below 70, or if you have a lot of readings over 200.   Please come back for a follow-up appointment in 2 months.

## 2021-08-31 ENCOUNTER — Telehealth: Payer: Self-pay

## 2021-08-31 NOTE — Progress Notes (Signed)
Chronic Care Management Pharmacy Assistant   Name: Scott Donaldson  MRN: [id number] DOB: 08/16/1950  Reason for Encounter: Hypertension Disease State Call.   Recent office visits:  07/29/2021 Dr. Ethelene Hal MD (PCP) start Docusate sodium 100 mg 2 times daily, start polyethylene glycol powder 17 GM/SCOOP powder PRN, Return in about 3 weeks   Recent consult visits:  08/21/2021 Dr. Loanne Drilling MD (Endocrinology)  Increase the Lantus to 80 units each morning, follow-up appointment in 2 months.    Hospital visits:  None in previous 6 months  Medications: Outpatient Encounter Medications as of 08/31/2021  Medication Sig   amLODipine (NORVASC) 5 MG tablet TAKE 1 TABLET(5 MG) BY MOUTH DAILY   atorvastatin (LIPITOR) 40 MG tablet TAKE 1 TABLET(40 MG) BY MOUTH DAILY   calcium carbonate (TUMS - DOSED IN MG ELEMENTAL CALCIUM) 500 MG chewable tablet Chew 1 tablet by mouth as needed for indigestion or heartburn.   diclofenac Sodium (VOLTAREN) 1 % GEL Apply a small grape sized dollop to tender space of right knee up to 4 times daily.   docusate sodium (COLACE) 100 MG capsule Take 1 capsule (100 mg total) by mouth 2 (two) times daily.   ezetimibe (ZETIA) 10 MG tablet Take 1 tablet (10 mg total) by mouth daily. (Patient not taking: Reported on 07/29/2021)   glucose blood test strip 1 each by Other route 2 (two) times daily. And lancets 2/day   insulin glargine (LANTUS SOLOSTAR) 100 UNIT/ML Solostar Pen Inject 80 Units into the skin every morning. And pen needles 1/day   Iron, Ferrous Sulfate, 325 (65 Fe) MG TABS Take one daily   meloxicam (MOBIC) 15 MG tablet Take 15 mg by mouth daily.   metFORMIN (GLUCOPHAGE-XR) 500 MG 24 hr tablet Take 4 tablets (2,000 mg total) by mouth daily.   metoprolol succinate (TOPROL-XL) 50 MG 24 hr tablet Take 1 tablet (50 mg total) by mouth at bedtime. Take with or immediately following a meal.   olmesartan (BENICAR) 40 MG tablet Take 1 tablet (40 mg total) by mouth daily.    polyethylene glycol powder (GLYCOLAX/MIRALAX) 17 GM/SCOOP powder Take 17 g by mouth 2 (two) times daily as needed.   vitamin B-12 (CYANOCOBALAMIN) 1000 MCG tablet Take 1 tablet (1,000 mcg total) by mouth daily.   No facility-administered encounter medications on file as of 08/31/2021.   Care Gaps: Shingrix Vaccine Ophthalmology Exam  Star Rating Drugs: Atorvastatin 40 mg last filled on 08/10/2021 for 90 day supply at [facility]. Metformin 500 mg last filled on 08/20/2021 for 90 day supply at [facility]. Olmesartan 40 mg last filled on 07/11/2021 for 90 day supply at [facility].  Medication Fill Gaps: None ID  Reviewed chart prior to disease state call. Spoke with patient regarding BP  Recent Office Vitals: BP Readings from Last 3 Encounters:  08/21/21 (!) 142/76  07/29/21 134/68  02/13/21 130/80   Pulse Readings from Last 3 Encounters:  08/21/21 (!) 55  07/29/21 (!) 50  02/13/21 (!) 58    Wt Readings from Last 3 Encounters:  08/21/21 193 lb (87.5 kg)  07/29/21 194 lb 3.2 oz (88.1 kg)  02/13/21 190 lb (86.2 kg)     Kidney Function Lab Results  Component Value Date/Time   CREATININE 0.95 07/29/2021 12:04 PM   CREATININE 0.74 10/22/2020 09:51 AM   GFR 80.70 07/29/2021 12:04 PM   GFRNONAA >60 06/05/2020 05:30 AM   GFRAA >60 04/06/2020 10:02 PM    BMP Latest Ref Rng & Units  07/29/2021 10/22/2020 06/05/2020  Glucose 70 - 99 mg/dL 221(H) 188(H) 197(H)  BUN 6 - 23 mg/dL 17 12 20   Creatinine 0.40 - 1.50 mg/dL 0.95 0.74 0.82  BUN/Creat Ratio 10 - 24 - - -  Sodium 135 - 145 mEq/L 135 141 135  Potassium 3.5 - 5.1 mEq/L 4.6 3.9 4.4  Chloride 96 - 112 mEq/L 102 105 105  CO2 19 - 32 mEq/L 26 27 22   Calcium 8.4 - 10.5 mg/dL 9.1 9.0 8.4(L)    Current antihypertensive regimen:  Olmesartan 40 mg 1 tablet daily Metoprolol 50 mg 1 tablet daily at bedtime Amlodipine 5 mg 1 tablet daily  Patient daughter states patient has been having headache here and  there, but denies dizziness or lightheadedness.  How often are you checking your Blood Pressure? infrequently Current home BP readings:  Patient daughter states patient blood pressure ranges around 132/78, but the highest was 155/85. What recent interventions/DTPs have been made by any provider to improve Blood Pressure control since last CPP Visit: None ID Any recent hospitalizations or ED visits since last visit with CPP? No What diet changes have been made to improve Blood Pressure Control?  Patient daughter denies any changes to his diet What exercise is being done to improve your Blood Pressure Control?  Patient daughter states patient is not exercising.  Adherence Review: Is the patient currently on ACE/ARB medication? Yes Does the patient have >5 day gap between last estimated fill dates? No   Telephone follow up appointment with Care management team member was scheduled for : 12/03/2021 at 3:45 pm.   [place] Pharmacist Assistant [phone]

## 2021-09-05 ENCOUNTER — Other Ambulatory Visit: Payer: Self-pay | Admitting: Endocrinology

## 2021-09-28 ENCOUNTER — Telehealth: Payer: Self-pay | Admitting: Family Medicine

## 2021-09-28 NOTE — Telephone Encounter (Signed)
I spoke to patient's daughter, Scott Donaldson, and she'll call back and schedule Medicare Annual Wellness Visit (AWV) in office.  ? ?If not able to come in office, please offer to do virtually or by telephone.  Left office number and my jabber [phone]. ? ?Last AWV:08/04/2016 ? ?Please schedule at anytime with Nurse Health Advisor. ?  ?

## 2021-10-06 ENCOUNTER — Encounter: Payer: Self-pay | Admitting: Family Medicine

## 2021-10-06 ENCOUNTER — Other Ambulatory Visit: Payer: Self-pay | Admitting: Family Medicine

## 2021-10-06 DIAGNOSIS — I10 Essential (primary) hypertension: Secondary | ICD-10-CM

## 2021-10-08 ENCOUNTER — Telehealth: Payer: Self-pay

## 2021-10-08 NOTE — Progress Notes (Signed)
? ? ?Chronic Care Management ?Pharmacy Assistant  ? ?Name: OMARIAN JAQUITH  MRN: [id number] DOB: 02/22/1951 ? ?Reason for [place] Call. ?  ?Recent office visits:  ?No recent office visit ? ?Recent consult visits:  ?No recent consult visit ? ?Hospital visits:  ?None in previous 6 months ? ?Medications: ?Outpatient Encounter Medications as of 10/08/2021  ?Medication Sig  ? amLODipine (NORVASC) 5 MG tablet TAKE 1 TABLET(5 MG) BY MOUTH DAILY  ? atorvastatin (LIPITOR) 40 MG tablet TAKE 1 TABLET(40 MG) BY MOUTH DAILY  ? calcium carbonate (TUMS - DOSED IN MG ELEMENTAL CALCIUM) 500 MG chewable tablet Chew 1 tablet by mouth as needed for indigestion or heartburn.  ? diclofenac Sodium (VOLTAREN) 1 % GEL Apply a small grape sized dollop to tender space of right knee up to 4 times daily.  ? docusate sodium (COLACE) 100 MG capsule Take 1 capsule (100 mg total) by mouth 2 (two) times daily.  ? ezetimibe (ZETIA) 10 MG tablet Take 1 tablet (10 mg total) by mouth daily. (Patient not taking: Reported on 07/29/2021)  ? glucose blood test strip 1 each by Other route 2 (two) times daily. And lancets 2/day  ? HUMULIN 70/30 (70-30) 100 UNIT/ML injection ADMINISTER 36 UNITS UNDER THE SKIN TWICE DAILY WITH A MEAL  ? insulin glargine (LANTUS SOLOSTAR) 100 UNIT/ML Solostar Pen Inject 80 Units into the skin every morning. And pen needles 1/day  ? Iron, Ferrous Sulfate, 325 (65 Fe) MG TABS Take one daily  ? meloxicam (MOBIC) 15 MG tablet Take 15 mg by mouth daily.  ? metFORMIN (GLUCOPHAGE-XR) 500 MG 24 hr tablet Take 4 tablets (2,000 mg total) by mouth daily.  ? metoprolol succinate (TOPROL-XL) 50 MG 24 hr tablet Take 1 tablet (50 mg total) by mouth at bedtime. Take with or immediately following a meal.  ? olmesartan (BENICAR) 40 MG tablet TAKE 1 TABLET(40 MG) BY MOUTH DAILY  ? polyethylene glycol powder (GLYCOLAX/MIRALAX) 17 GM/SCOOP powder Take 17 g by mouth 2 (two) times daily as needed.  ? vitamin B-12  (CYANOCOBALAMIN) 1000 MCG tablet Take 1 tablet (1,000 mcg total) by mouth daily.  ? ?No facility-administered encounter medications on file as of 10/08/2021.  ? ? ?Care Gaps: ?Shingrix Vaccine ?Ophthalmology Exam ?Colonoscopy ?  ?Star Rating Drugs: ?Atorvastatin 40 mg last filled on 08/10/2021 for 90 day supply at [facility]. ?Metformin 500 mg last filled on 08/20/2021 for 90 day supply at [facility]. ?Olmesartan 40 mg last filled on 10/06/2021 for 90 day supply at [facility]. ?  ?Medication Fill Gaps: ?None ID ? ?Reviewed chart prior to disease state call. Spoke with patient regarding BP ? ?Recent Office Vitals: ?BP Readings from Last 3 Encounters:  ?08/21/21 (!) 142/76  ?07/29/21 134/68  ?02/13/21 130/80  ? ?Pulse Readings from Last 3 Encounters:  ?08/21/21 (!) 55  ?07/29/21 (!) 50  ?02/13/21 (!) 58  ?  ?Wt Readings from Last 3 Encounters:  ?08/21/21 193 lb (87.5 kg)  ?07/29/21 194 lb 3.2 oz (88.1 kg)  ?02/13/21 190 lb (86.2 kg)  ?  ? ?Kidney Function ?Lab Results  ?Component Value Date/Time  ? CREATININE 0.95 07/29/2021 12:04 PM  ? CREATININE 0.74 10/22/2020 09:51 AM  ? GFR 80.70 07/29/2021 12:04 PM  ? GFRNONAA >60 06/05/2020 05:30 AM  ? GFRAA >60 04/06/2020 10:02 PM  ? ? ?BMP Latest Ref Rng & Units 07/29/2021 10/22/2020 06/05/2020  ?Glucose 70 - 99 mg/dL 221(H) 188(H) 197(H)  ?BUN 6 - 23 mg/dL 17 12  20  ?Creatinine 0.40 - 1.50 mg/dL 0.95 0.74 0.82  ?BUN/Creat Ratio 10 - 24 - - -  ?Sodium 135 - 145 mEq/L 135 141 135  ?Potassium 3.5 - 5.1 mEq/L 4.6 3.9 4.4  ?Chloride 96 - 112 mEq/L 102 105 105  ?CO2 19 - 32 mEq/L '26 27 22  '$ ?Calcium 8.4 - 10.5 mg/dL 9.1 9.0 8.4(L)  ? ? ?Current antihypertensive regimen:  ?Olmesartan 40 mg 1 tablet daily ?Metoprolol 50 mg 1 tablet daily at bedtime ?Amlodipine 5 mg 1 tablet daily ? ?Patient daughter states patient has been having headache here and there, but denies dizziness or lightheadedness. ? ?How often are you checking your Blood Pressure? 1-2x per  week ? ?Current home BP readings:  ?Patient daughter states patient blood pressure ranges around 135/87-90. ? ?What recent interventions/DTPs have been made by any provider to improve Blood Pressure control since last CPP Visit: None ID ? ?Any recent hospitalizations or ED visits since last visit with CPP? No ? ?What diet changes have been made to improve Blood Pressure Control?  ?Patient daughter denies any changes to his diet ? ?What exercise is being done to improve your Blood Pressure Control?  ?Patient daughter states patient is not exercising. ? ? ?Adherence Review: ?Is the patient currently on ACE/ARB medication? Yes ?Does the patient have >5 day gap between last estimated fill dates? No ? ?Telephone follow up appointment with Care management team member was scheduled for : 12/03/2021 at 3:45 pm.  ?  ?Anderson Malta ?Clinical Pharmacist Assistant ?[phone]  ? ?

## 2021-10-21 ENCOUNTER — Emergency Department (HOSPITAL_BASED_OUTPATIENT_CLINIC_OR_DEPARTMENT_OTHER): Payer: Medicare Other

## 2021-10-21 ENCOUNTER — Encounter (HOSPITAL_BASED_OUTPATIENT_CLINIC_OR_DEPARTMENT_OTHER): Payer: Self-pay | Admitting: Emergency Medicine

## 2021-10-21 ENCOUNTER — Other Ambulatory Visit: Payer: Self-pay

## 2021-10-21 ENCOUNTER — Inpatient Hospital Stay (HOSPITAL_BASED_OUTPATIENT_CLINIC_OR_DEPARTMENT_OTHER)
Admission: EM | Admit: 2021-10-21 | Discharge: 2021-10-23 | DRG: 247 | Disposition: A | Discharge status: Home / Self Care | Payer: Medicare Other | Attending: Internal Medicine | Admitting: Internal Medicine

## 2021-10-21 ENCOUNTER — Telehealth: Payer: Self-pay | Admitting: Family Medicine

## 2021-10-21 ENCOUNTER — Other Ambulatory Visit: Payer: Self-pay | Admitting: Family Medicine

## 2021-10-21 DIAGNOSIS — Z833 Family history of diabetes mellitus: Secondary | ICD-10-CM | POA: Diagnosis not present

## 2021-10-21 DIAGNOSIS — Z7984 Long term (current) use of oral hypoglycemic drugs: Secondary | ICD-10-CM

## 2021-10-21 DIAGNOSIS — Z87891 Personal history of nicotine dependence: Secondary | ICD-10-CM

## 2021-10-21 DIAGNOSIS — Z79899 Other long term (current) drug therapy: Secondary | ICD-10-CM | POA: Diagnosis not present

## 2021-10-21 DIAGNOSIS — E1140 Type 2 diabetes mellitus with diabetic neuropathy, unspecified: Secondary | ICD-10-CM | POA: Diagnosis present

## 2021-10-21 DIAGNOSIS — Z8249 Family history of ischemic heart disease and other diseases of the circulatory system: Secondary | ICD-10-CM

## 2021-10-21 DIAGNOSIS — K219 Gastro-esophageal reflux disease without esophagitis: Secondary | ICD-10-CM | POA: Diagnosis present

## 2021-10-21 DIAGNOSIS — I447 Left bundle-branch block, unspecified: Secondary | ICD-10-CM | POA: Diagnosis present

## 2021-10-21 DIAGNOSIS — E7800 Pure hypercholesterolemia, unspecified: Secondary | ICD-10-CM | POA: Diagnosis present

## 2021-10-21 DIAGNOSIS — R42 Dizziness and giddiness: Secondary | ICD-10-CM | POA: Diagnosis present

## 2021-10-21 DIAGNOSIS — Z794 Long term (current) use of insulin: Secondary | ICD-10-CM | POA: Diagnosis not present

## 2021-10-21 DIAGNOSIS — E1165 Type 2 diabetes mellitus with hyperglycemia: Secondary | ICD-10-CM | POA: Diagnosis present

## 2021-10-21 DIAGNOSIS — I2510 Atherosclerotic heart disease of native coronary artery without angina pectoris: Secondary | ICD-10-CM | POA: Diagnosis not present

## 2021-10-21 DIAGNOSIS — Z791 Long term (current) use of non-steroidal anti-inflammatories (NSAID): Secondary | ICD-10-CM | POA: Diagnosis not present

## 2021-10-21 DIAGNOSIS — H9193 Unspecified hearing loss, bilateral: Secondary | ICD-10-CM | POA: Diagnosis present

## 2021-10-21 DIAGNOSIS — I110 Hypertensive heart disease with heart failure: Secondary | ICD-10-CM | POA: Diagnosis present

## 2021-10-21 DIAGNOSIS — Z974 Presence of external hearing-aid: Secondary | ICD-10-CM

## 2021-10-21 DIAGNOSIS — I951 Orthostatic hypotension: Secondary | ICD-10-CM | POA: Diagnosis not present

## 2021-10-21 DIAGNOSIS — I10 Essential (primary) hypertension: Secondary | ICD-10-CM

## 2021-10-21 DIAGNOSIS — N401 Enlarged prostate with lower urinary tract symptoms: Secondary | ICD-10-CM | POA: Diagnosis present

## 2021-10-21 DIAGNOSIS — I5032 Chronic diastolic (congestive) heart failure: Secondary | ICD-10-CM | POA: Diagnosis not present

## 2021-10-21 DIAGNOSIS — I214 Non-ST elevation (NSTEMI) myocardial infarction: Secondary | ICD-10-CM | POA: Diagnosis not present

## 2021-10-21 DIAGNOSIS — Z20822 Contact with and (suspected) exposure to covid-19: Secondary | ICD-10-CM | POA: Diagnosis not present

## 2021-10-21 DIAGNOSIS — Z9079 Acquired absence of other genital organ(s): Secondary | ICD-10-CM | POA: Diagnosis not present

## 2021-10-21 DIAGNOSIS — R739 Hyperglycemia, unspecified: Secondary | ICD-10-CM

## 2021-10-21 LAB — CBC WITH DIFFERENTIAL/PLATELET
Abs Immature Granulocytes: 0.02 10*3/uL (ref 0.00–0.07)
Basophils Absolute: 0 10*3/uL (ref 0.0–0.1)
Basophils Relative: 1 %
Eosinophils Absolute: 0.1 10*3/uL (ref 0.0–0.5)
Eosinophils Relative: 2 %
HCT: 37.9 % — ABNORMAL LOW (ref 39.0–52.0)
Hemoglobin: 13.3 g/dL (ref 13.0–17.0)
Immature Granulocytes: 0 %
Lymphocytes Relative: 31 %
Lymphs Abs: 2 10*3/uL (ref 0.7–4.0)
MCH: 29.4 pg (ref 26.0–34.0)
MCHC: 35.1 g/dL (ref 30.0–36.0)
MCV: 83.8 fL (ref 80.0–100.0)
Monocytes Absolute: 0.4 10*3/uL (ref 0.1–1.0)
Monocytes Relative: 6 %
Neutro Abs: 4 10*3/uL (ref 1.7–7.7)
Neutrophils Relative %: 60 %
Platelets: 187 10*3/uL (ref 150–400)
RBC: 4.52 MIL/uL (ref 4.22–5.81)
RDW: 12.5 % (ref 11.5–15.5)
WBC: 6.5 10*3/uL (ref 4.0–10.5)
nRBC: 0 % (ref 0.0–0.2)

## 2021-10-21 LAB — COMPREHENSIVE METABOLIC PANEL
ALT: 17 U/L (ref 0–44)
AST: 19 U/L (ref 15–41)
Albumin: 4 g/dL (ref 3.5–5.0)
Alkaline Phosphatase: 76 U/L (ref 38–126)
Anion gap: 9 (ref 5–15)
BUN: 22 mg/dL (ref 8–23)
CO2: 24 mmol/L (ref 22–32)
Calcium: 9.2 mg/dL (ref 8.9–10.3)
Chloride: 99 mmol/L (ref 98–111)
Creatinine, Ser: 0.94 mg/dL (ref 0.61–1.24)
GFR, Estimated: >60 mL/min (ref >60)
Glucose, Bld: 413 mg/dL — ABNORMAL HIGH (ref 70–99)
Potassium: 3.8 mmol/L (ref 3.5–5.1)
Sodium: 132 mmol/L — ABNORMAL LOW (ref 135–145)
Total Bilirubin: 0.6 mg/dL (ref 0.3–1.2)
Total Protein: 7.2 g/dL (ref 6.5–8.1)

## 2021-10-21 LAB — CBG MONITORING, ED: Glucose-Capillary: 189 mg/dL — ABNORMAL HIGH (ref 70–99)

## 2021-10-21 LAB — RESP PANEL BY RT-PCR (FLU A&B, COVID) ARPGX2
Influenza A by PCR: NEGATIVE
Influenza B by PCR: NEGATIVE
SARS Coronavirus 2 by RT PCR: NEGATIVE

## 2021-10-21 LAB — TROPONIN I (HIGH SENSITIVITY)
Troponin I (High Sensitivity): 233 ng/L (ref <18)
Troponin I (High Sensitivity): 242 ng/L (ref <18)

## 2021-10-21 MED ORDER — HEPARIN BOLUS VIA INFUSION
4000.0000 [IU] | Freq: Once | INTRAVENOUS | Status: AC
  Administered 2021-10-21: 4000 [IU] via INTRAVENOUS

## 2021-10-21 MED ORDER — MECLIZINE HCL 25 MG PO TABS
25.0000 mg | ORAL_TABLET | Freq: Once | ORAL | Status: AC
  Administered 2021-10-21: 25 mg via ORAL
  Filled 2021-10-21: qty 1

## 2021-10-21 MED ORDER — INSULIN ASPART 100 UNIT/ML IJ SOLN
0.0000 [IU] | INTRAMUSCULAR | Status: DC
  Administered 2021-10-22: 5 [IU] via SUBCUTANEOUS
  Administered 2021-10-22: 1 [IU] via SUBCUTANEOUS
  Administered 2021-10-22: 2 [IU] via SUBCUTANEOUS
  Administered 2021-10-22: 3 [IU] via SUBCUTANEOUS

## 2021-10-21 MED ORDER — AMLODIPINE BESYLATE 5 MG PO TABS
5.0000 mg | ORAL_TABLET | Freq: Once | ORAL | Status: AC
  Administered 2021-10-21: 5 mg via ORAL
  Filled 2021-10-21: qty 1

## 2021-10-21 MED ORDER — ASPIRIN 325 MG PO TABS
325.0000 mg | ORAL_TABLET | Freq: Once | ORAL | Status: AC
  Administered 2021-10-21: 325 mg via ORAL
  Filled 2021-10-21: qty 1

## 2021-10-21 MED ORDER — IOHEXOL 350 MG/ML SOLN
80.0000 mL | Freq: Once | INTRAVENOUS | Status: AC | PRN
  Administered 2021-10-21: 80 mL via INTRAVENOUS

## 2021-10-21 MED ORDER — HEPARIN (PORCINE) 25000 UT/250ML-% IV SOLN
1150.0000 [IU]/h | INTRAVENOUS | Status: DC
Start: 2021-10-21 — End: 2021-10-22
  Administered 2021-10-21 – 2021-10-22 (×2): 1150 [IU]/h via INTRAVENOUS
  Filled 2021-10-21 (×2): qty 250

## 2021-10-21 MED ORDER — INSULIN ASPART 100 UNIT/ML IJ SOLN
8.0000 [IU] | Freq: Once | INTRAMUSCULAR | Status: AC
  Administered 2021-10-21: 8 [IU] via SUBCUTANEOUS

## 2021-10-21 MED ORDER — DIPHENHYDRAMINE HCL 50 MG/ML IJ SOLN
25.0000 mg | Freq: Once | INTRAMUSCULAR | Status: AC
  Administered 2021-10-21: 25 mg via INTRAVENOUS
  Filled 2021-10-21: qty 1

## 2021-10-21 MED ORDER — METOCLOPRAMIDE HCL 5 MG/ML IJ SOLN
10.0000 mg | Freq: Once | INTRAMUSCULAR | Status: AC
  Administered 2021-10-21: 10 mg via INTRAVENOUS
  Filled 2021-10-21: qty 2

## 2021-10-21 NOTE — ED Notes (Signed)
Lab phone call rec: re Critical Lab result - Troponin of 242 on Delta Troponin, Dr Darl Householder MD informed ?

## 2021-10-21 NOTE — ED Notes (Signed)
Cardiology Consult request made. Spoke with Otila Kluver at [facility] ?

## 2021-10-21 NOTE — ED Notes (Signed)
ED MD informed of Troponin Level ?

## 2021-10-21 NOTE — Progress Notes (Signed)
ANTICOAGULATION CONSULT NOTE - Initial Consult ? ?Pharmacy Consult for heparin ?Indication: chest pain/ACS ? ?No Known Allergies ? ?Patient Measurements: ?Height: 6' (182.9 cm) ?Weight: 86.2 kg (190 lb) ?IBW/kg (Calculated) : 77.6 ?Heparin Dosing Weight: 86.2 kg ? ?Vital Signs: ?Temp: 98.4 ?F (36.9 ?C) (03/29 1523) ?Temp Source: Oral (03/29 1523) ?BP: 165/71 (03/29 1745) ?Pulse Rate: 54 (03/29 1745) ? ?Labs: ?Recent Labs  ?  10/21/21 ?1601  ?HGB 13.3  ?HCT 37.9*  ?PLT 187  ?CREATININE 0.94  ?TROPONINIHS 233*  ? ? ?Estimated Creatinine Clearance: 79.1 mL/min (by C-G formula based on SCr of 0.94 mg/dL). ? ? ?Medical History: ?Past Medical History:  ?Diagnosis Date  ? Abdominal pain 08/04/2016  ? Abnormal PSA 05/19/2015  ? Anemia   ? Arthralgia 11/12/2010  ? Benign prostatic hyperplasia with urinary hesitancy 06/14/2018  ? Cataract   ? Coronary artery disease   ? nonobstructive  ? Diabetes ([place]) 03/04/2007  ? Qualifier: Diagnosis of  By: Loanne Drilling MD, Jacelyn Pi   ? DIABETES MELLITUS, TYPE II 03/04/2007  ? Elevated LDL cholesterol level 06/14/2018  ? Foot ulcer, right ([place]) 04/15/2014  ? GERD (gastroesophageal reflux disease)   ? HOH (hard of hearing)   ? both ears no hearing aides  ? HYPERCHOLESTEROLEMIA 07/29/2008  ? HYPERTENSION 03/04/2007  ? Knee pain, left 05/16/2015  ? Nonintractable episodic headache 12/01/2016  ? migraine  ? SHOULDER PAIN, BILATERAL 01/12/2010  ? Qualifier: Diagnosis of  By: Loanne Drilling MD, Jacelyn Pi   ? 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 Qualifier: Diagnosis of  By: Loanne Drilling MD, Hilliard Clark A   ? ? ?Medications:  ?(Not in a hospital admission)  ?Scheduled:  ? heparin  4,000 Units Intravenous Once  ? ?Infusions:  ? heparin    ? ?No anticoagulation PTA noted. ? ?Assessment: ?Patient with PMH of CAD presents to the ED with worsening chest pain that radiates to the left arm. Pharmacy is consulted to dose heparin. Troponin is 233, Scr is 0.94, and EKG read noted LBB. Pharmacy consulted to dose heparin. ? ?Goal of Therapy:   ?Heparin level 0.3-0.7 units/ml ?Monitor platelets by anticoagulation protocol: Yes ?  ?Plan:  ?Give heparin 4000 unit bolus ?Start heparin at 1150 units/hr ?6 hour heparin level ?Daily heparin level and CBC ? ?Thank you for allowing pharmacy to participate in this patient's care. ? ?Reatha Harps, PharmD ?PGY1 Pharmacy Resident ?10/21/2021 6:07 PM ?Check [email or web address] for unit specific pharmacy number ? ? ? ?

## 2021-10-21 NOTE — Assessment & Plan Note (Addendum)
Continue heparin, daily Asprin and cycle CE ?Serial ECG  ?Lipid panel ordered ? discussed with  Cardiology  ?Echo in AM NPO post midnight  ?For possible cardiac cath ? ?

## 2021-10-21 NOTE — Telephone Encounter (Signed)
FYI: Pts daughter called and said she checked pts BP this morning and it was 138/115 and later on in the afternoon it was 168/158. I forwarded the call to Triage Nurse. Loree Fee  ?

## 2021-10-21 NOTE — Telephone Encounter (Signed)
Patient went to the ER after talking to triage.  Dm/cma ? ?

## 2021-10-21 NOTE — ED Notes (Signed)
[facility] at bedside. Pt stable for transport. 

## 2021-10-21 NOTE — ED Provider Notes (Signed)
?[facility] EMERGENCY DEPARTMENT ?Provider Note ? ? ?CSN: [id number] ?Arrival date & time: 10/21/21  1514 ? ?  ? ?History ? ?Chief Complaint  ?Patient presents with  ? Chest Pain  ? Dizziness  ? ? ?Scott Donaldson is a 71 y.o. male hx of HTN, CAD, DM, here presenting with chest pain and dizziness.  Patient has a history of CAD.  He states that since yesterday he has been lightheaded and dizzy.  He states that he also has some chest pain with exertion.  He states that today he has worsening chest pain goes to the left arm.  He states that he has a history of CAD and had a cath in 2019 which showed moderate obstructive CAD and no stents were placed at that time.  Patient follows up with Dr. [name] from cardiology. ? ?The history is provided by the patient.  ? ?  ? ?Home Medications ?Prior to Admission medications   ?Medication Sig Start Date End Date Taking? Authorizing Provider  ?amLODipine (NORVASC) 5 MG tablet TAKE 1 TABLET(5 MG) BY MOUTH DAILY 10/21/21   [name], MD  ?atorvastatin (LIPITOR) 40 MG tablet TAKE 1 TABLET(40 MG) BY MOUTH DAILY 11/14/20   [name], MD  ?calcium carbonate (TUMS - DOSED IN MG ELEMENTAL CALCIUM) 500 MG chewable tablet Chew 1 tablet by mouth as needed for indigestion or heartburn.    [provider]  ?diclofenac Sodium (VOLTAREN) 1 % GEL Apply a small grape sized dollop to tender space of right knee up to 4 times daily. 03/07/20   [name], MD  ?docusate sodium (COLACE) 100 MG capsule Take 1 capsule (100 mg total) by mouth 2 (two) times daily. 07/29/21   [name], MD  ?ezetimibe (ZETIA) 10 MG tablet Take 1 tablet (10 mg total) by mouth daily. ?Patient not taking: Reported on 07/29/2021 01/29/21 04/29/21  Isaiah Serge, NP  ?glucose blood test strip 1 each by Other route 2 (two) times daily. And lancets 2/day 08/21/21   Renato Shin, MD  ?HUMULIN 70/30 (70-30) 100 UNIT/ML injection ADMINISTER 36 UNITS UNDER THE SKIN TWICE  DAILY WITH A MEAL 09/07/21   Renato Shin, MD  ?insulin glargine (LANTUS SOLOSTAR) 100 UNIT/ML Solostar Pen Inject 80 Units into the skin every morning. And pen needles 1/day 08/21/21   Renato Shin, MD  ?Iron, Ferrous Sulfate, 325 (65 Fe) MG TABS Take one daily 04/18/20   [name], MD  ?meloxicam (MOBIC) 15 MG tablet Take 15 mg by mouth daily. 07/13/21   [provider]  ?metFORMIN (GLUCOPHAGE-XR) 500 MG 24 hr tablet Take 4 tablets (2,000 mg total) by mouth daily. 02/13/21   Renato Shin, MD  ?metoprolol succinate (TOPROL-XL) 50 MG 24 hr tablet Take 1 tablet (50 mg total) by mouth at bedtime. Take with or immediately following a meal. 03/12/21   [name], MD  ?olmesartan (BENICAR) 40 MG tablet TAKE 1 TABLET(40 MG) BY MOUTH DAILY 10/06/21   [name], MD  ?polyethylene glycol powder (GLYCOLAX/MIRALAX) 17 GM/SCOOP powder Take 17 g by mouth 2 (two) times daily as needed. 07/29/21   [name], MD  ?vitamin B-12 (CYANOCOBALAMIN) 1000 MCG tablet Take 1 tablet (1,000 mcg total) by mouth daily. 01/22/21   [name], MD  ?   ? ?Allergies    ?Patient has no known allergies.   ? ?Review of Systems   ?Review of Systems  ?Cardiovascular:  Positive for chest pain.  ?  All other systems reviewed and are negative. ? ?Physical Exam ?Updated Vital Signs ?BP (!) 142/70   Pulse (!) 57   Temp 98.4 ?F (36.9 ?C) (Oral)   Resp 18   Ht 6' (1.829 m)   Wt 86.2 kg   SpO2 99%   BMI 25.77 kg/m?  ?Physical Exam ?Vitals and nursing note reviewed.  ?Constitutional:   ?   Comments: Chronically ill  ?HENT:  ?   Head: Normocephalic.  ?Eyes:  ?   Extraocular Movements: Extraocular movements intact.  ?   Pupils: Pupils are equal, round, and reactive to light.  ?Cardiovascular:  ?   Rate and Rhythm: Normal rate and regular rhythm.  ?   Heart sounds: Normal heart sounds.  ?Pulmonary:  ?   Effort: Pulmonary effort is normal.  ?   Breath sounds: Normal breath sounds.  ?Abdominal:  ?    General: Bowel sounds are normal.  ?   Palpations: Abdomen is soft.  ?Musculoskeletal:     ?   General: Normal range of motion.  ?   Cervical back: Normal range of motion and neck supple.  ?Skin: ?   General: Skin is warm.  ?Neurological:  ?   Comments: No obvious nystagmus.  Patient has normal strength bilateral arms and legs.  Patient is able to ambulate by himself.  ?Psychiatric:     ?   Mood and Affect: Mood normal.     ?   Behavior: Behavior normal.  ? ? ?ED Results / Procedures / Treatments   ?Labs ?(all labs ordered are listed, but only abnormal results are displayed) ?Labs Reviewed  ?COMPREHENSIVE METABOLIC PANEL - Abnormal; Notable for the following components:  ?    Result Value  ? Sodium 132 (*)   ? Glucose, Bld 413 (*)   ? All other components within normal limits  ?CBC WITH DIFFERENTIAL/PLATELET - Abnormal; Notable for the following components:  ? HCT 37.9 (*)   ? All other components within normal limits  ?TROPONIN I (HIGH SENSITIVITY) - Abnormal; Notable for the following components:  ? Troponin I (High Sensitivity) 233 (*)   ? All other components within normal limits  ?RESP PANEL BY RT-PCR (FLU A&B, COVID) ARPGX2  ?HEPARIN LEVEL (UNFRACTIONATED)  ?TROPONIN I (HIGH SENSITIVITY)  ? ? ?EKG ?EKG Interpretation ? ?Date/Time:  Wednesday October 21 2021 15:22:24 EDT ?Ventricular Rate:  63 ?PR Interval:  150 ?QRS Duration: 126 ?QT Interval:  434 ?QTC Calculation: 444 ?R Axis:   -25 ?Text Interpretation: Sinus rhythm with occasional Premature ventricular complexes with ventricular escape complexes Left axis deviation Left bundle branch block Abnormal ECG When compared with ECG of 27-Mar-2020 09:15, PREVIOUS ECG IS PRESENT Confirmed by Wandra Arthurs [phone]) on 10/21/2021 4:42:19 PM ? ?Radiology ?CT ANGIO HEAD NECK W WO CM ? ?Result Date: 10/21/2021 ?CLINICAL DATA:  Dizziness, persistent or recurrent, left chest pain EXAM: CT ANGIOGRAPHY HEAD AND NECK TECHNIQUE: Multidetector CT imaging of the head and neck was  performed using the standard protocol during bolus administration of intravenous contrast. Multiplanar CT image reconstructions and MIPs were obtained to evaluate the vascular anatomy. Carotid stenosis measurements (when applicable) are obtained utilizing NASCET criteria, using the distal internal carotid diameter as the denominator. RADIATION DOSE REDUCTION: This exam was performed according to the departmental dose-optimization program which includes automated exposure control, adjustment of the mA and/or kV according to patient size and/or use of iterative reconstruction technique. CONTRAST:  34m OMNIPAQUE IOHEXOL 350 MG/ML SOLN COMPARISON:  No prior CTA,  correlation is made with CT head 03/27/2020. FINDINGS: CT HEAD FINDINGS Brain: No evidence of acute infarction, hemorrhage, cerebral edema, mass, mass effect, or midline shift. No hydrocephalus or extra-axial fluid collection. Vascular: No hyperdense vessel. Skull: Normal. Negative for fracture or focal lesion. Sinuses/Orbits: No acute finding. Other: The mastoid air cells are well aerated. CTA NECK FINDINGS Aortic arch: Standard branching. Imaged portion shows no evidence of aneurysm or dissection. No significant stenosis of the major arch vessel origins. Aortic atherosclerosis. Right carotid system: No evidence of dissection, stenosis (50% or greater) or occlusion. Plaque in the common carotid, at the bifurcation, and in the proximal right ICA is not hemodynamically significant. Left carotid system: No evidence of dissection, stenosis (50% or greater) or occlusion. Plaque in the common carotid, at the bifurcation, and in the proximal left ICA is not hemodynamically significant. Vertebral arteries: Left dominant. At the origin of the right vertebral artery, there is significant calcification, which extends into the proximal V1 segment, which appears to be moderately stenosed. Otherwise no significant stenosis in the vertebral arteries. No evidence of  dissection or occlusion. Skeleton: No acute osseous abnormality. Other neck: Negative. Upper chest: Emphysema. No focal pulmonary opacity or pleural effusion. Review of the MIP images confirms the above findings CTA H

## 2021-10-21 NOTE — ED Triage Notes (Signed)
Pt c/o LT CP for some time; reports unsteady gait and lightheadedness for several days; c/o HA and HTN ?

## 2021-10-22 ENCOUNTER — Other Ambulatory Visit: Payer: Self-pay

## 2021-10-22 ENCOUNTER — Other Ambulatory Visit (HOSPITAL_COMMUNITY): Payer: Self-pay

## 2021-10-22 ENCOUNTER — Inpatient Hospital Stay (HOSPITAL_COMMUNITY)
Admission: EM | Disposition: A | Discharge status: Home / Self Care | Payer: Medicare Other | Source: Home / Self Care | Attending: Internal Medicine

## 2021-10-22 ENCOUNTER — Observation Stay (HOSPITAL_COMMUNITY): Payer: Medicare Other

## 2021-10-22 DIAGNOSIS — Z8249 Family history of ischemic heart disease and other diseases of the circulatory system: Secondary | ICD-10-CM | POA: Diagnosis not present

## 2021-10-22 DIAGNOSIS — Z794 Long term (current) use of insulin: Secondary | ICD-10-CM

## 2021-10-22 DIAGNOSIS — I214 Non-ST elevation (NSTEMI) myocardial infarction: Secondary | ICD-10-CM

## 2021-10-22 DIAGNOSIS — Z79899 Other long term (current) drug therapy: Secondary | ICD-10-CM | POA: Diagnosis not present

## 2021-10-22 DIAGNOSIS — I5032 Chronic diastolic (congestive) heart failure: Secondary | ICD-10-CM | POA: Diagnosis present

## 2021-10-22 DIAGNOSIS — E7800 Pure hypercholesterolemia, unspecified: Secondary | ICD-10-CM | POA: Diagnosis present

## 2021-10-22 DIAGNOSIS — Z20822 Contact with and (suspected) exposure to covid-19: Secondary | ICD-10-CM | POA: Diagnosis present

## 2021-10-22 DIAGNOSIS — I951 Orthostatic hypotension: Secondary | ICD-10-CM | POA: Diagnosis present

## 2021-10-22 DIAGNOSIS — I2510 Atherosclerotic heart disease of native coronary artery without angina pectoris: Secondary | ICD-10-CM

## 2021-10-22 DIAGNOSIS — Z974 Presence of external hearing-aid: Secondary | ICD-10-CM | POA: Diagnosis not present

## 2021-10-22 DIAGNOSIS — E1165 Type 2 diabetes mellitus with hyperglycemia: Secondary | ICD-10-CM | POA: Diagnosis present

## 2021-10-22 DIAGNOSIS — K219 Gastro-esophageal reflux disease without esophagitis: Secondary | ICD-10-CM | POA: Diagnosis present

## 2021-10-22 DIAGNOSIS — R42 Dizziness and giddiness: Secondary | ICD-10-CM

## 2021-10-22 DIAGNOSIS — Z87891 Personal history of nicotine dependence: Secondary | ICD-10-CM | POA: Diagnosis not present

## 2021-10-22 DIAGNOSIS — I10 Essential (primary) hypertension: Secondary | ICD-10-CM | POA: Diagnosis not present

## 2021-10-22 DIAGNOSIS — R739 Hyperglycemia, unspecified: Secondary | ICD-10-CM

## 2021-10-22 DIAGNOSIS — I447 Left bundle-branch block, unspecified: Secondary | ICD-10-CM | POA: Diagnosis present

## 2021-10-22 DIAGNOSIS — I110 Hypertensive heart disease with heart failure: Secondary | ICD-10-CM | POA: Diagnosis present

## 2021-10-22 DIAGNOSIS — Z7984 Long term (current) use of oral hypoglycemic drugs: Secondary | ICD-10-CM | POA: Diagnosis not present

## 2021-10-22 DIAGNOSIS — Z791 Long term (current) use of non-steroidal anti-inflammatories (NSAID): Secondary | ICD-10-CM | POA: Diagnosis not present

## 2021-10-22 DIAGNOSIS — H9193 Unspecified hearing loss, bilateral: Secondary | ICD-10-CM | POA: Diagnosis present

## 2021-10-22 DIAGNOSIS — Z833 Family history of diabetes mellitus: Secondary | ICD-10-CM | POA: Diagnosis not present

## 2021-10-22 DIAGNOSIS — R3911 Hesitancy of micturition: Secondary | ICD-10-CM

## 2021-10-22 DIAGNOSIS — E1140 Type 2 diabetes mellitus with diabetic neuropathy, unspecified: Secondary | ICD-10-CM | POA: Diagnosis present

## 2021-10-22 DIAGNOSIS — N401 Enlarged prostate with lower urinary tract symptoms: Secondary | ICD-10-CM | POA: Diagnosis not present

## 2021-10-22 DIAGNOSIS — Z9079 Acquired absence of other genital organ(s): Secondary | ICD-10-CM | POA: Diagnosis not present

## 2021-10-22 HISTORY — PX: CORONARY STENT INTERVENTION: CATH118234

## 2021-10-22 HISTORY — PX: LEFT HEART CATH AND CORONARY ANGIOGRAPHY: CATH118249

## 2021-10-22 LAB — CBC WITH DIFFERENTIAL/PLATELET
Abs Immature Granulocytes: 0.02 10*3/uL (ref 0.00–0.07)
Basophils Absolute: 0 10*3/uL (ref 0.0–0.1)
Basophils Relative: 1 %
Eosinophils Absolute: 0.1 10*3/uL (ref 0.0–0.5)
Eosinophils Relative: 2 %
HCT: 35.5 % — ABNORMAL LOW (ref 39.0–52.0)
Hemoglobin: 12.6 g/dL — ABNORMAL LOW (ref 13.0–17.0)
Immature Granulocytes: 0 %
Lymphocytes Relative: 45 %
Lymphs Abs: 2.4 10*3/uL (ref 0.7–4.0)
MCH: 29.6 pg (ref 26.0–34.0)
MCHC: 35.5 g/dL (ref 30.0–36.0)
MCV: 83.3 fL (ref 80.0–100.0)
Monocytes Absolute: 0.4 10*3/uL (ref 0.1–1.0)
Monocytes Relative: 7 %
Neutro Abs: 2.4 10*3/uL (ref 1.7–7.7)
Neutrophils Relative %: 45 %
Platelets: 160 10*3/uL (ref 150–400)
RBC: 4.26 MIL/uL (ref 4.22–5.81)
RDW: 12.5 % (ref 11.5–15.5)
WBC: 5.3 10*3/uL (ref 4.0–10.5)
nRBC: 0 % (ref 0.0–0.2)

## 2021-10-22 LAB — GLUCOSE, CAPILLARY
Glucose-Capillary: 278 mg/dL — ABNORMAL HIGH (ref 70–99)
Glucose-Capillary: 217 mg/dL — ABNORMAL HIGH (ref 70–99)
Glucose-Capillary: 187 mg/dL — ABNORMAL HIGH (ref 70–99)
Glucose-Capillary: 130 mg/dL — ABNORMAL HIGH (ref 70–99)
Glucose-Capillary: 85 mg/dL (ref 70–99)
Glucose-Capillary: 166 mg/dL — ABNORMAL HIGH (ref 70–99)

## 2021-10-22 LAB — COMPREHENSIVE METABOLIC PANEL
ALT: 18 U/L (ref 0–44)
AST: 17 U/L (ref 15–41)
Albumin: 3.6 g/dL (ref 3.5–5.0)
Alkaline Phosphatase: 54 U/L (ref 38–126)
Anion gap: 7 (ref 5–15)
BUN: 15 mg/dL (ref 8–23)
CO2: 27 mmol/L (ref 22–32)
Calcium: 9.3 mg/dL (ref 8.9–10.3)
Chloride: 105 mmol/L (ref 98–111)
Creatinine, Ser: 0.82 mg/dL (ref 0.61–1.24)
GFR, Estimated: >60 mL/min (ref >60)
Glucose, Bld: 270 mg/dL — ABNORMAL HIGH (ref 70–99)
Potassium: 3.7 mmol/L (ref 3.5–5.1)
Sodium: 139 mmol/L (ref 135–145)
Total Bilirubin: 0.6 mg/dL (ref 0.3–1.2)
Total Protein: 6.5 g/dL (ref 6.5–8.1)

## 2021-10-22 LAB — LIPID PANEL
Cholesterol: 125 mg/dL (ref 0–200)
HDL: 44 mg/dL (ref >40)
LDL Cholesterol: 66 mg/dL (ref 0–99)
Total CHOL/HDL Ratio: 2.8 RATIO
Triglycerides: 74 mg/dL (ref <150)
VLDL: 15 mg/dL (ref 0–40)

## 2021-10-22 LAB — ECHOCARDIOGRAM COMPLETE
Area-P 1/2: 2.75 cm2
Calc EF: 43.4 %
Height: 72 in
S' Lateral: 4.1 cm
Single Plane A2C EF: 44.4 %
Single Plane A4C EF: 66 %
Weight: 3040 oz

## 2021-10-22 LAB — TROPONIN I (HIGH SENSITIVITY)
Troponin I (High Sensitivity): 257 ng/L (ref <18)
Troponin I (High Sensitivity): 265 ng/L (ref <18)

## 2021-10-22 LAB — POCT ACTIVATED CLOTTING TIME
Activated Clotting Time: 251 seconds
Activated Clotting Time: 389 seconds
Activated Clotting Time: 281 seconds

## 2021-10-22 LAB — TSH: TSH: 4.528 u[IU]/mL — ABNORMAL HIGH (ref 0.350–4.500)

## 2021-10-22 LAB — PHOSPHORUS: Phosphorus: 4.7 mg/dL — ABNORMAL HIGH (ref 2.5–4.6)

## 2021-10-22 LAB — MAGNESIUM: Magnesium: 1.6 mg/dL — ABNORMAL LOW (ref 1.7–2.4)

## 2021-10-22 LAB — HEMOGLOBIN A1C
Hgb A1c MFr Bld: 10.4 % — ABNORMAL HIGH (ref 4.8–5.6)
Mean Plasma Glucose: 251.78 mg/dL

## 2021-10-22 LAB — BRAIN NATRIURETIC PEPTIDE: B Natriuretic Peptide: 108.2 pg/mL — ABNORMAL HIGH (ref 0.0–100.0)

## 2021-10-22 LAB — HEPARIN LEVEL (UNFRACTIONATED)
Heparin Unfractionated: 0.46 IU/mL (ref 0.30–0.70)
Heparin Unfractionated: 0.47 IU/mL (ref 0.30–0.70)

## 2021-10-22 SURGERY — LEFT HEART CATH AND CORONARY ANGIOGRAPHY
Anesthesia: LOCAL

## 2021-10-22 MED ORDER — HEPARIN SODIUM (PORCINE) 1000 UNIT/ML IJ SOLN
INTRAMUSCULAR | Status: AC
  Filled 2021-10-22: qty 10

## 2021-10-22 MED ORDER — LABETALOL HCL 5 MG/ML IV SOLN: 10.0000 mg | INTRAVENOUS | Status: AC | PRN

## 2021-10-22 MED ORDER — HYDROCODONE-ACETAMINOPHEN 5-325 MG PO TABS: 1.0000 | ORAL_TABLET | ORAL | Status: DC | PRN

## 2021-10-22 MED ORDER — SODIUM CHLORIDE 0.9% FLUSH
3.0000 mL | Freq: Two times a day (BID) | INTRAVENOUS | Status: DC
  Administered 2021-10-21 – 2021-10-22 (×2): 3 mL via INTRAVENOUS

## 2021-10-22 MED ORDER — ASPIRIN 81 MG PO CHEW
81.0000 mg | CHEWABLE_TABLET | Freq: Every day | ORAL | Status: DC
  Administered 2021-10-22: 81 mg via ORAL
  Filled 2021-10-22: qty 1

## 2021-10-22 MED ORDER — LIDOCAINE HCL (PF) 1 % IJ SOLN
INTRAMUSCULAR | Status: AC
  Filled 2021-10-22: qty 30

## 2021-10-22 MED ORDER — VERAPAMIL HCL 2.5 MG/ML IV SOLN
INTRAVENOUS | Status: AC
  Filled 2021-10-22: qty 2

## 2021-10-22 MED ORDER — ACETAMINOPHEN 500 MG PO TABS: 1000.0000 mg | ORAL_TABLET | Freq: Once | ORAL | Status: DC

## 2021-10-22 MED ORDER — MIDAZOLAM HCL 2 MG/2ML IJ SOLN
INTRAMUSCULAR | Status: AC
  Filled 2021-10-22: qty 2

## 2021-10-22 MED ORDER — SODIUM CHLORIDE 0.9 % WEIGHT BASED INFUSION: 1.0000 mL/kg/h | INTRAVENOUS | Status: DC

## 2021-10-22 MED ORDER — FENTANYL CITRATE (PF) 100 MCG/2ML IJ SOLN
INTRAMUSCULAR | Status: AC
Start: 2021-10-22 — End: ?
  Filled 2021-10-22: qty 2

## 2021-10-22 MED ORDER — EZETIMIBE 10 MG PO TABS
10.0000 mg | ORAL_TABLET | Freq: Every day | ORAL | Status: DC
  Administered 2021-10-22 – 2021-10-23 (×2): 10 mg via ORAL
  Filled 2021-10-22 (×2): qty 1

## 2021-10-22 MED ORDER — MIDAZOLAM HCL 2 MG/2ML IJ SOLN
INTRAMUSCULAR | Status: DC | PRN
  Administered 2021-10-22 (×4): 1 mg via INTRAVENOUS

## 2021-10-22 MED ORDER — INSULIN ASPART 100 UNIT/ML IJ SOLN: 0.0000 [IU] | Freq: Every day | INTRAMUSCULAR | Status: DC

## 2021-10-22 MED ORDER — ACETAMINOPHEN 325 MG PO TABS: 650.0000 mg | ORAL_TABLET | Freq: Four times a day (QID) | ORAL | Status: DC | PRN

## 2021-10-22 MED ORDER — SODIUM CHLORIDE 0.9% FLUSH: 3.0000 mL | INTRAVENOUS | Status: DC | PRN

## 2021-10-22 MED ORDER — SODIUM CHLORIDE 0.9 % IV SOLN: 250.0000 mL | INTRAVENOUS | Status: DC | PRN

## 2021-10-22 MED ORDER — INSULIN GLARGINE 100 UNIT/ML ~~LOC~~ SOLN
25.0000 [IU] | Freq: Every day | SUBCUTANEOUS | Status: DC
  Administered 2021-10-22 (×2): 25 [IU] via SUBCUTANEOUS
  Filled 2021-10-22 (×3): qty 0.25

## 2021-10-22 MED ORDER — ASPIRIN 81 MG PO CHEW
81.0000 mg | CHEWABLE_TABLET | Freq: Every day | ORAL | Status: DC
  Administered 2021-10-23: 81 mg via ORAL
  Filled 2021-10-22: qty 1

## 2021-10-22 MED ORDER — MIDAZOLAM HCL 2 MG/2ML IJ SOLN
INTRAMUSCULAR | Status: AC
Start: 2021-10-22 — End: ?
  Filled 2021-10-22: qty 2

## 2021-10-22 MED ORDER — HYDRALAZINE HCL 20 MG/ML IJ SOLN: 10.0000 mg | INTRAMUSCULAR | Status: AC | PRN

## 2021-10-22 MED ORDER — VERAPAMIL HCL 2.5 MG/ML IV SOLN
INTRAVENOUS | Status: DC | PRN
  Administered 2021-10-22: 10 mL via INTRA_ARTERIAL

## 2021-10-22 MED ORDER — ACETAMINOPHEN 500 MG PO TABS: 1000.0000 mg | ORAL_TABLET | Freq: Four times a day (QID) | ORAL | Status: DC | PRN

## 2021-10-22 MED ORDER — ACETAMINOPHEN 325 MG PO TABS: 650.0000 mg | ORAL_TABLET | ORAL | Status: DC | PRN

## 2021-10-22 MED ORDER — ACETAMINOPHEN 650 MG RE SUPP
650.0000 mg | Freq: Four times a day (QID) | RECTAL | Status: DC | PRN
Start: 2021-10-22 — End: 2021-10-23

## 2021-10-22 MED ORDER — TICAGRELOR 90 MG PO TABS
ORAL_TABLET | ORAL | Status: AC
  Filled 2021-10-22: qty 2

## 2021-10-22 MED ORDER — HEPARIN (PORCINE) IN NACL 1000-0.9 UT/500ML-% IV SOLN
INTRAVENOUS | Status: AC
  Filled 2021-10-22: qty 1000

## 2021-10-22 MED ORDER — SODIUM CHLORIDE 0.9 % IV SOLN: INTRAVENOUS | Status: AC

## 2021-10-22 MED ORDER — METOPROLOL SUCCINATE ER 50 MG PO TB24: 50.0000 mg | ORAL_TABLET | Freq: Every day | ORAL | Status: DC

## 2021-10-22 MED ORDER — SODIUM CHLORIDE 0.9% FLUSH
3.0000 mL | Freq: Two times a day (BID) | INTRAVENOUS | Status: DC
  Administered 2021-10-22 – 2021-10-23 (×2): 3 mL via INTRAVENOUS

## 2021-10-22 MED ORDER — ONDANSETRON HCL 4 MG/2ML IJ SOLN: 4.0000 mg | Freq: Four times a day (QID) | INTRAMUSCULAR | Status: DC | PRN

## 2021-10-22 MED ORDER — INSULIN ASPART 100 UNIT/ML IJ SOLN
0.0000 [IU] | Freq: Three times a day (TID) | INTRAMUSCULAR | Status: DC
  Administered 2021-10-23: 7 [IU] via SUBCUTANEOUS
  Administered 2021-10-23: 2 [IU] via SUBCUTANEOUS

## 2021-10-22 MED ORDER — ATORVASTATIN CALCIUM 40 MG PO TABS
40.0000 mg | ORAL_TABLET | Freq: Every day | ORAL | Status: DC
  Administered 2021-10-22 – 2021-10-23 (×2): 40 mg via ORAL
  Filled 2021-10-22 (×2): qty 1

## 2021-10-22 MED ORDER — LIDOCAINE HCL (PF) 1 % IJ SOLN
INTRAMUSCULAR | Status: DC | PRN
  Administered 2021-10-22: 2 mL via INTRADERMAL

## 2021-10-22 MED ORDER — AMLODIPINE BESYLATE 5 MG PO TABS
5.0000 mg | ORAL_TABLET | Freq: Every day | ORAL | Status: DC
Start: 2021-10-22 — End: 2021-10-23
  Administered 2021-10-22 – 2021-10-23 (×2): 5 mg via ORAL
  Filled 2021-10-22 (×2): qty 1

## 2021-10-22 MED ORDER — HEPARIN SODIUM (PORCINE) 1000 UNIT/ML IJ SOLN
INTRAMUSCULAR | Status: DC | PRN
  Administered 2021-10-22: 5000 [IU] via INTRAVENOUS
  Administered 2021-10-22: 4000 [IU] via INTRAVENOUS
  Administered 2021-10-22: 3000 [IU] via INTRAVENOUS

## 2021-10-22 MED ORDER — MAGNESIUM SULFATE 2 GM/50ML IV SOLN
2.0000 g | Freq: Once | INTRAVENOUS | Status: AC
  Administered 2021-10-22: 2 g via INTRAVENOUS
  Filled 2021-10-22: qty 50

## 2021-10-22 MED ORDER — SODIUM CHLORIDE 0.9% FLUSH
3.0000 mL | INTRAVENOUS | Status: DC | PRN
Start: 2021-10-22 — End: 2021-10-23

## 2021-10-22 MED ORDER — FENTANYL CITRATE (PF) 100 MCG/2ML IJ SOLN
INTRAMUSCULAR | Status: DC | PRN
  Administered 2021-10-22 (×4): 25 ug via INTRAVENOUS

## 2021-10-22 MED ORDER — IOHEXOL 350 MG/ML SOLN
INTRAVENOUS | Status: DC | PRN
  Administered 2021-10-22: 50 mL via INTRA_ARTERIAL

## 2021-10-22 MED ORDER — SODIUM CHLORIDE 0.9% FLUSH
3.0000 mL | Freq: Two times a day (BID) | INTRAVENOUS | Status: DC
  Administered 2021-10-22 (×2): 3 mL via INTRAVENOUS

## 2021-10-22 MED ORDER — MORPHINE SULFATE (PF) 2 MG/ML IV SOLN: 2.0000 mg | INTRAVENOUS | Status: DC | PRN

## 2021-10-22 MED ORDER — SODIUM CHLORIDE 0.9 % WEIGHT BASED INFUSION
3.0000 mL/kg/h | INTRAVENOUS | Status: DC
  Administered 2021-10-22: 3 mL/kg/h via INTRAVENOUS

## 2021-10-22 MED ORDER — NITROGLYCERIN IN D5W 200-5 MCG/ML-% IV SOLN: 0.0000 ug/min | INTRAVENOUS | Status: DC

## 2021-10-22 MED ORDER — TICAGRELOR 90 MG PO TABS
90.0000 mg | ORAL_TABLET | Freq: Two times a day (BID) | ORAL | Status: DC
  Administered 2021-10-23 (×2): 90 mg via ORAL
  Filled 2021-10-22 (×2): qty 1

## 2021-10-22 MED ORDER — POTASSIUM CHLORIDE CRYS ER 20 MEQ PO TBCR
30.0000 meq | EXTENDED_RELEASE_TABLET | Freq: Two times a day (BID) | ORAL | Status: DC
  Administered 2021-10-22 – 2021-10-23 (×3): 30 meq via ORAL
  Filled 2021-10-22 (×3): qty 1

## 2021-10-22 MED ORDER — HEPARIN (PORCINE) IN NACL 1000-0.9 UT/500ML-% IV SOLN
INTRAVENOUS | Status: DC | PRN
  Administered 2021-10-22 (×2): 500 mL

## 2021-10-22 MED ORDER — SODIUM CHLORIDE 0.9 % IV SOLN
INTRAVENOUS | Status: AC | PRN
  Administered 2021-10-22: 10 mL/h via INTRAVENOUS

## 2021-10-22 MED ORDER — NITROGLYCERIN 1 MG/10 ML FOR IR/CATH LAB
INTRA_ARTERIAL | Status: AC
  Filled 2021-10-22: qty 10

## 2021-10-22 MED ORDER — TICAGRELOR 90 MG PO TABS
ORAL_TABLET | ORAL | Status: DC | PRN
  Administered 2021-10-22: 180 mg via ORAL

## 2021-10-22 SURGICAL SUPPLY — 27 items
BALLN SAPPHIRE 2.5X12 (BALLOONS) ×2
BALLN SAPPHIRE ~~LOC~~ 2.0X12 (BALLOONS) ×1 IMPLANT
BALLN ~~LOC~~ EUPHORA RX 2.5X8 (BALLOONS) ×2
BALLOON SAPPHIRE 2.5X12 (BALLOONS) IMPLANT
BALLOON ~~LOC~~ EUPHORA RX 2.5X8 (BALLOONS) IMPLANT
BAND CMPR LRG ZPHR (HEMOSTASIS) ×1
BAND ZEPHYR COMPRESS 30 LONG (HEMOSTASIS) ×1 IMPLANT
CATH DIAG 6FR JR4 (CATHETERS) ×1 IMPLANT
CATH DRAGONFLY OPTIS 2.7FR (CATHETERS) ×1 IMPLANT
CATH INFINITI 5FR ANG PIGTAIL (CATHETERS) ×1 IMPLANT
CATH INFINITI 6F FL3.5 (CATHETERS) ×1 IMPLANT
CATH LAUNCHER 6FR EBU3.5 (CATHETERS) ×1 IMPLANT
GLIDESHEATH SLEND SS 6F .021 (SHEATH) ×1 IMPLANT
GUIDEWIRE INQWIRE 1.5J.035X260 (WIRE) IMPLANT
GUIDEWIRE VAS SION BLUE 190 (WIRE) ×1 IMPLANT
INQWIRE 1.5J .035X260CM (WIRE) ×2
KIT ENCORE 26 ADVANTAGE (KITS) ×1 IMPLANT
KIT ESSENTIALS PG (KITS) ×1 IMPLANT
KIT HEART LEFT (KITS) ×3 IMPLANT
PACK CARDIAC CATHETERIZATION (CUSTOM PROCEDURE TRAY) ×3 IMPLANT
STENT ONYX FRONTIER 2.5X12 (Permanent Stent) ×1 IMPLANT
SYR MEDRAD MARK 7 150ML (SYRINGE) ×3 IMPLANT
TRANSDUCER W/STOPCOCK (MISCELLANEOUS) ×3 IMPLANT
TUBING CIL FLEX 10 FLL-RA (TUBING) ×3 IMPLANT
WIRE ASAHI PROWATER 180CM (WIRE) ×1 IMPLANT
WIRE CHOICE GRAPHX PT 300 (WIRE) ×1 IMPLANT
WIRE EMERALD 3MM-J .035X150CM (WIRE) ×1 IMPLANT

## 2021-10-22 NOTE — Progress Notes (Signed)
CRITICAL VALUE ALERT ?Critical value received:  troponin=257 ? ?Date of notification:  10/21/21 ? ? ?Time of notification:  0110 ? ?Critical value read back:Yes.   ? ?Nurse who received alert:  N. Xayvier Vallez  ? ?MD notified (1st page):  Dr Kalman Shan ? ?Time of first page:  0120 ? ?MD notified (2nd page): ? ?Time of second page: ? ?Responding MD:  Dr Kalman Shan ? ?Time MD responded:  0122  ?

## 2021-10-22 NOTE — Progress Notes (Signed)
?PROGRESS NOTE ? ?Scott Donaldson  ?DOB: [birth year]  ?PCP: [name], MD ?HKV:[id number]  ?DOA: 10/21/2021 ? LOS: 0 days  ?Hospital Day: 2 ? ?Brief narrative: ?Scott Donaldson is a 71 y.o. male with PMH significant for DM2, HTN, HLD, nonobstructive CAD, LBBB, anemia, GERD, BPH s/p TURP. ?On 3/29, patient woke up in the morning around 6 AM with chest pain, radiation to his arm.  He also started having worsening of his chronic exertional dyspnea and went to [place] ED. ? ?In the ED, patient had blood pressure elevated 184/80, EKG with known LBBB be in PVCs ?He was feeling unstable and complained of dizziness. 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 ? ?Subjective: ?Patient was seen and examined this morning.  Pleasant elderly Caucasian male.  Lying on bed.  Not in distress.  He was waiting for cardiac cath. ? ?Principal Problem: ?  NSTEMI (non-ST elevated myocardial infarction) ([place]) ?Active Problems: ?  Type 2 diabetes mellitus with hyperglycemia, with long-term current use of insulin ([place]) ?  HYPERCHOLESTEROLEMIA ?  Essential hypertension ?  Light-headedness ?  Chronic diastolic CHF (congestive heart failure) ([place]) ?  ? ?Assessment and Plan:  ?NSTEMI (non-ST elevated myocardial infarction) ?Hyperlipidemia ?-Presented with chest pain, worsening shortness of breath, elevated troponin  ?-Seen by cardiology.  Underwent cardiac cath today.  Per cath note, noted to have a high-grade ostial left circumflex lesion for which she got a drug-eluting stent.  Mild to moderate diffuse disease elsewhere. ?-Troponin trend as below. ?-Currently on aspirin 81 mg daily and heparin drip. ?-Lipitor 40 mg daily, Zetia 10 mg daily ?Recent Labs  ?  10/21/21 ?1746 10/21/21 ?2345  10/22/21 ?0100  ?TROPONINIHS 242* 257* 265*  ? ?Uncontrolled type 2 diabetes mellitus with hyperglycemia ?-A1c 10.4 on 3/30 ?-Home meds include Lantus 25 units daily, Humulin 70/30 36 units twice daily, metformin 500 mg daily ?-Currently ordered for Lantus 25 units daily, sliding scale insulin.  Metformin remains on hold ?-Continue Accu-Cheks. ?Recent Labs  ?Lab 10/21/21 ?2118 10/22/21 ?0135 10/22/21 ?0803 10/22/21 ?1227  ?GLUCAP 189* 278* 187* 130*  ? ?Chronic diastolic CHF  ?essential hypertension ?-Home meds include Toprol 50 mg daily at bedtime, amlodipine 5 mg daily, Benicar 40 mg daily ?-Currently continued amlodipine.  Metoprolol on hold because of bradycardia.  Benicar on hold. ?-Defer to cardiology for med adjustment based on cath finding. ?-Echo today with EF 55 to 60%, GIDD ? ?Goals of care ?  Code Status: Full Code  ? ? ?Mobility: Encourage ambulation ? ?Nutritional status:  ?Body mass index is 25.77 kg/m?.  ?  ?  ? ? ? ? ?Diet:  ?Diet Order   ? ?       ?  Diet Heart Room service appropriate? Yes; Fluid consistency: Thin  Diet effective now       ?  ? ?  ?  ? ?  ? ? ?DVT prophylaxis: Heparin drip ? ?  ?Antimicrobials: None ?Fluid: NS at 75 as part of cardiac cath order set ?Consultants: Cardiology ?Family Communication: None at bedside ? ?Status is: Inpatient ? ?Continue in-hospital care because: Underwent cardiac cath and stenting today ?Level of care: Progressive  ? ?Dispo: The patient is from: Home ?  Anticipated d/c is to: Hopefully home tomorrow ?             Patient currently is not medically stable to d/c. ?  Difficult to place patient No ? ? ? ? ?Infusions:  ? sodium chloride    ? sodium chloride 75 mL/hr at 10/22/21 1658  ? sodium chloride    ? heparin Stopped (10/22/21 1310)  ? nitroGLYCERIN    ? ? ?Scheduled Meds: ? amLODipine  5 mg Oral Daily  ? aspirin  81 mg Oral Daily  ? aspirin  81 mg Oral Daily  ? atorvastatin  40 mg Oral Daily  ? ezetimibe  10 mg Oral Daily  ? insulin aspart   0-5 Units Subcutaneous QHS  ? [START ON 10/23/2021] insulin aspart  0-9 Units Subcutaneous TID WC  ? insulin glargine  25 Units Subcutaneous Q2200  ? potassium chloride  30 mEq Oral BID  ? sodium chloride flush  3 mL Intravenous Q12H  ? sodium chloride flush  3 mL Intravenous Q12H  ? sodium chloride flush  3 mL Intravenous Q12H  ? ticagrelor  90 mg Oral BID  ? ? ?PRN meds: ?sodium chloride, sodium chloride, acetaminophen **OR** acetaminophen, acetaminophen, hydrALAZINE, HYDROcodone-acetaminophen, labetalol, morphine injection, ondansetron (ZOFRAN) IV, sodium chloride flush, sodium chloride flush  ? ?Antimicrobials: ?Anti-infectives (From admission, onward)  ? ? None  ? ?  ? ? ?Objective: ?Vitals:  ? 10/22/21 1615 10/22/21 1657  ?BP: 115/61 (!) 142/75  ?Pulse: (!) 50 (!) 52  ?Resp:  17  ?Temp:  98.3 ?F (36.8 ?C)  ?SpO2: 98% 99%  ? ? ?Intake/Output Summary (Last 24 hours) at 10/22/2021 1701 ?Last data filed at 10/22/2021 [phone] ?Gross per 24 hour  ?Intake --  ?Output 700 ml  ?Net -700 ml  ? ?Filed Weights  ? 10/21/21 1540  ?Weight: 86.2 kg  ? ?Weight change:  ?Body mass index is 25.77 kg/m?.  ? ?Physical Exam: ?General exam: Pleasant, elderly Caucasian male.  Not in physical distress ?Skin: No rashes, lesions or ulcers. ?HEENT: Atraumatic, normocephalic, no obvious bleeding ?Lungs: Clear to auscultation bilaterally ?CVS: Regular rate and rhythm, no murmur ?GI/Abd soft, nontender, nondistended, bowel sound present ?CNS: Alert, awake, oriented x3 ?Psychiatry: Mood appropriate ?Extremities: No pedal edema, no calf tenderness ? ?Data Review: I have personally reviewed the laboratory data and studies available. ? ?F/u labs ordered ?Unresulted Labs (From admission, onward)  ? ?  Start     Ordered  ? 10/23/21 0500  Heparin level (unfractionated)  Daily,   R     ? 10/21/21 2155  ? 10/23/21 0867  Basic metabolic panel  Tomorrow morning,   R       ?Question:  Specimen collection method  Answer:  Lab=Lab collect  ? 10/22/21 1655  ?  10/23/21 0500  CBC  Tomorrow morning,   R       ?Question:  Specimen collection method  Answer:  Lab=Lab collect  ? 10/22/21 1655  ? 10/22/21 0500  CBC  Daily,   R     ? 10/21/21 2155  ? ?  ?  ? ?  ? ? ?Signed, ?Terrilee Croak, MD ?[facility] ?10/22/2021 ? ? ? ? ? ? ? ? ? ? ? ? ?

## 2021-10-22 NOTE — Assessment & Plan Note (Signed)
Currently appears to be euvolemic we will continue to monitor ?

## 2021-10-22 NOTE — TOC Benefit Eligibility Note (Signed)
Patient Advocate Encounter ? ?Insurance verification completed.   ? ?The patient is currently admitted and upon discharge could be taking Farxiga 10 mg. ? ?The current 30 day co-pay is, $47.00.  ? ?The patient is currently admitted and upon discharge could be taking Jardiance 10 mg. ? ?The current 30 day co-pay is, $47.00.  ? ?The patient is insured through [employer] Part D  ? ? ? ?Lyndel Safe, CPhT ?Pharmacy Patient Advocate Specialist ?[facility] Patient Advocate Team ?Direct Number: [phone]  Fax: [phone] ? ? ? ? ? ?  ?

## 2021-10-22 NOTE — Progress Notes (Signed)
?  Echocardiogram ?2D Echocardiogram has been performed. ? ?Scott Donaldson ?10/22/2021, 9:51 AM ?

## 2021-10-22 NOTE — Progress Notes (Signed)
Inpatient Diabetes Program Recommendations ? ?AACE/ADA: New Consensus Statement on Inpatient Glycemic Control (2015) ? ?Target Ranges:  Prepandial:   less than 140 mg/dL ?     Peak postprandial:   less than 180 mg/dL (1-2 hours) ?     Critically ill patients:  140 - 180 mg/dL  ? ?Lab Results  ?Component Value Date  ? GLUCAP 130 (H) 10/22/2021  ? HGBA1C 10.4 (H) 10/22/2021  ? ? ?Review of Glycemic Control ? Latest Reference Range & Units 10/22/21 01:35 10/22/21 08:03 10/22/21 12:27  ?Glucose-Capillary 70 - 99 mg/dL 278 (H) 187 (H) 130 (H)  ? ?Diabetes history: DM 2 ?Outpatient Diabetes medications:  ?Lantus 80 units q AM (per last note from Endocrinology) ?Current orders for Inpatient glycemic control:  ?Lantus 25 units daily ?Novolog 0-9 units q 4 hours ?Inpatient Diabetes Program Recommendations:   ?Blood sugars currently well controlled.  Attempted to speak with patient but he is in the cath lab. Need to clarify home meds. and make sure there is a good plan for home insulin at discharge.  Will follow.  ? ?Thanks,  ?Adah Perl, RN, BC-ADM ?Inpatient Diabetes Coordinator ?Pager [phone]  (8a-5p) ? ?

## 2021-10-22 NOTE — H&P (View-Only) (Signed)
? ?The patient has been seen in conjunction with Reino Bellis, NP. All aspects of care have been considered and discussed. The patient has been personally interviewed, examined, and all clinical data has been reviewed. ? ?Preliminary echo with inferobasal WMA. ?Symptoms and elevated Ti  warrant cor angio and revasc. if indicated. ?The patient was counseled to undergo left heart catheterization, coronary angiography, and possible percutaneous coronary intervention with stent implantation. The procedural risks and benefits were discussed in detail. The risks discussed included death, stroke, myocardial infarction, life-threatening bleeding, limb ischemia, kidney injury, allergy, and possible emergency cardiac surgery. The risk of these significant complications were estimated to occur less than 1% of the time. After discussion, the patient has agreed to proceed. ? ? ? ?Progress Note ? ?Patient Name: Scott Donaldson ?Date of Encounter: 10/22/2021 ? ?[facility] Cardiologist: Jenkins Rouge, MD  ? ?Subjective  ? ?No chest pain. Echo at the bedside ? ?Inpatient Medications  ?  ?Scheduled Meds: ? amLODipine  5 mg Oral Daily  ? aspirin  81 mg Oral Daily  ? atorvastatin  40 mg Oral Daily  ? ezetimibe  10 mg Oral Daily  ? insulin aspart  0-9 Units Subcutaneous Q4H  ? insulin glargine  25 Units Subcutaneous Q2200  ? sodium chloride flush  3 mL Intravenous Q12H  ? ?Continuous Infusions: ? sodium chloride    ? heparin 1,150 Units/hr (10/22/21 0600)  ? nitroGLYCERIN    ? ?PRN Meds: ?sodium chloride, acetaminophen **OR** acetaminophen, HYDROcodone-acetaminophen, morphine injection, sodium chloride flush  ? ?Vital Signs  ?  ?Vitals:  ? 10/21/21 2115 10/21/21 2207 10/22/21 0441 10/22/21 0807  ?BP: 138/83  102/78 119/66  ?Pulse:  61 (!) 51 (!) 55  ?Resp: '17 14 19 17  '$ ?Temp:  98.2 ?F (36.8 ?C) 98.2 ?F (36.8 ?C) 98 ?F (36.7 ?C)  ?TempSrc:  Oral Oral Oral  ?SpO2:  99% 97% 95%  ?Weight:      ?Height:      ? ? ?Intake/Output  Summary (Last 24 hours) at 10/22/2021 5320 ?Last data filed at 10/22/2021 [phone] ?Gross per 24 hour  ?Intake --  ?Output 700 ml  ?Net -700 ml  ? ? ?  10/21/2021  ?  3:40 PM 08/21/2021  ?  8:24 AM 07/29/2021  ? 11:30 AM  ?Last 3 Weights  ?Weight (lbs) 190 lb 193 lb 194 lb 3.2 oz  ?Weight (kg) 86.183 kg 87.544 kg 88.089 kg  ?   ? ?Telemetry  ?  ?SB rates 50s, PVCs - Personally Reviewed ? ?ECG  ?  ?SB, 52 bpm PVCs LBBB - Personally Reviewed ? ?Physical Exam  ? ?GEN: No acute distress.   ?Neck: No JVD ?Cardiac: RRR, 2/6 systolic murmur, no rubs, or gallops.  ?Respiratory: Clear to auscultation bilaterally. ?GI: Soft, nontender, non-distended  ?MS: No edema; No deformity. ?Neuro:  Nonfocal  ?Psych: Normal affect  ? ?Labs  ?  ?High Sensitivity Troponin:   ?Recent Labs  ?Lab 10/21/21 ?1601 10/21/21 ?1746 10/21/21 ?2345 10/22/21 ?0100  ?TROPONINIHS 233* 242* 257* 265*  ?   ?Chemistry ?Recent Labs  ?Lab 10/21/21 ?1601 10/22/21 ?0100  ?NA 132* 139  ?K 3.8 3.7  ?CL 99 105  ?CO2 24 27  ?GLUCOSE 413* 270*  ?BUN 22 15  ?CREATININE 0.94 0.82  ?CALCIUM 9.2 9.3  ?MG  --  1.6*  ?PROT 7.2 6.5  ?ALBUMIN 4.0 3.6  ?AST 19 17  ?ALT 17 18  ?ALKPHOS 76 54  ?BILITOT 0.6 0.6  ?GFRNONAA >60 >  60  ?ANIONGAP 9 7  ?  ?Lipids  ?Recent Labs  ?Lab 10/22/21 ?0100  ?CHOL 125  ?TRIG 74  ?HDL 44  ?[place] 66  ?CHOLHDL 2.8  ?  ?Hematology ?Recent Labs  ?Lab 10/21/21 ?1601 10/22/21 ?0100  ?WBC 6.5 5.3  ?RBC 4.52 4.26  ?HGB 13.3 12.6*  ?HCT 37.9* 35.5*  ?MCV 83.8 83.3  ?MCH 29.4 29.6  ?MCHC 35.1 35.5  ?RDW 12.5 12.5  ?PLT 187 160  ? ?Thyroid  ?Recent Labs  ?Lab 10/22/21 ?6712  ?TSH 4.528*  ?  ?BNP ?Recent Labs  ?Lab 10/22/21 ?0100  ?BNP 108.2*  ?  ?DDimer No results for input(s): DDIMER in the last 168 hours.  ? ?Radiology  ?  ?CT ANGIO HEAD NECK W WO CM ? ?Result Date: 10/21/2021 ?CLINICAL DATA:  Dizziness, persistent or recurrent, left chest pain EXAM: CT ANGIOGRAPHY HEAD AND NECK TECHNIQUE: Multidetector CT imaging of the head and neck was performed using the  standard protocol during bolus administration of intravenous contrast. Multiplanar CT image reconstructions and MIPs were obtained to evaluate the vascular anatomy. Carotid stenosis measurements (when applicable) are obtained utilizing NASCET criteria, using the distal internal carotid diameter as the denominator. RADIATION DOSE REDUCTION: This exam was performed according to the departmental dose-optimization program which includes automated exposure control, adjustment of the mA and/or kV according to patient size and/or use of iterative reconstruction technique. CONTRAST:  76m OMNIPAQUE IOHEXOL 350 MG/ML SOLN COMPARISON:  No prior CTA, correlation is made with CT head 03/27/2020. FINDINGS: CT HEAD FINDINGS Brain: No evidence of acute infarction, hemorrhage, cerebral edema, mass, mass effect, or midline shift. No hydrocephalus or extra-axial fluid collection. Vascular: No hyperdense vessel. Skull: Normal. Negative for fracture or focal lesion. Sinuses/Orbits: No acute finding. Other: The mastoid air cells are well aerated. CTA NECK FINDINGS Aortic arch: Standard branching. Imaged portion shows no evidence of aneurysm or dissection. No significant stenosis of the major arch vessel origins. Aortic atherosclerosis. Right carotid system: No evidence of dissection, stenosis (50% or greater) or occlusion. Plaque in the common carotid, at the bifurcation, and in the proximal right ICA is not hemodynamically significant. Left carotid system: No evidence of dissection, stenosis (50% or greater) or occlusion. Plaque in the common carotid, at the bifurcation, and in the proximal left ICA is not hemodynamically significant. Vertebral arteries: Left dominant. At the origin of the right vertebral artery, there is significant calcification, which extends into the proximal V1 segment, which appears to be moderately stenosed. Otherwise no significant stenosis in the vertebral arteries. No evidence of dissection or occlusion.  Skeleton: No acute osseous abnormality. Other neck: Negative. Upper chest: Emphysema. No focal pulmonary opacity or pleural effusion. Review of the MIP images confirms the above findings CTA HEAD FINDINGS Anterior circulation: Both internal carotid arteries are patent to the termini, without significant stenosis. Mild calcifications in the supraclinoid ICAs are not hemodynamically significant. A1 segments patent with a hypoplastic right A1. Normal anterior communicating artery. Anterior cerebral arteries are patent to their distal aspects. No M1 stenosis or occlusion. Normal MCA bifurcations. Distal MCA branches perfused and symmetric. Posterior circulation: Vertebral arteries patent to the vertebrobasilar junction without stenosis. Posterior inferior cerebral arteries patent bilaterally. Basilar patent to its distal aspect. Superior cerebellar arteries patent bilaterally. Patent P1 segments. PCAs perfused to their distal aspects without stenosis. The bilateral posterior communicating arteries are not visualized. Venous sinuses: As permitted by contrast timing, patent. Anatomic variants: None significant. Review of the MIP images confirms the above findings IMPRESSION:  1. Moderate narrowing of the origin of the right vertebral artery. Otherwise no hemodynamically significant stenosis in the neck. 2.  No intracranial large vessel occlusion or significant stenosis. 3.  No acute intracranial process. 4. Aortic Atherosclerosis (ICD10-I70.0) and Emphysema (ICD10-J43.9). Electronically Signed   By: Merilyn Baba M.D.   On: 10/21/2021 17:46  ? ?DG Chest 2 View ? ?Result Date: 10/21/2021 ?CLINICAL DATA:  71 y./o male. Pt c/o LT CP for some time; reports unsteady gait and lightheadedness for several days; c/o HA and HTNCP EXAM: CHEST - 2 VIEW COMPARISON:  None. FINDINGS: Normal mediastinum and cardiac silhouette. Normal pulmonary vasculature. No evidence of effusion, infiltrate, or pneumothorax. No acute bony abnormality.  Degenerative osteophytosis of the spine. IMPRESSION: No acute cardiopulmonary process. Electronically Signed   By: Suzy Bouchard M.D.   On: 10/21/2021 16:20   ? ?Cardiac Studies  ? ?Echo: pending ? ?Patient Profil

## 2021-10-22 NOTE — Subjective & Objective (Signed)
Presented with left side CP, occasional unsteady gait and lightheaded for the past few days ?Reports HA and HTN  ?CP worse with exertion radiates to left arm ?Last cardiac cath was in 2019 no stents but had moderate CAD ?

## 2021-10-22 NOTE — Assessment & Plan Note (Signed)
Presented with hyperglycemia ?order sliding scale ?Change to Lantus while in house 20 units and can adjust aup as needed ? ?

## 2021-10-22 NOTE — Progress Notes (Signed)
ANTICOAGULATION CONSULT NOTE ? ?Pharmacy Consult for heparin ?Indication: chest pain/ACS ? ?No Known Allergies ? ?Patient Measurements: ?Height: 6' (182.9 cm) ?Weight: 86.2 kg (190 lb) ?IBW/kg (Calculated) : 77.6 ?Heparin Dosing Weight: 86.2 kg ? ?Vital Signs: ?Temp: 98.2 ?F (36.8 ?C) (03/29 2207) ?Temp Source: Oral (03/29 2207) ?BP: 138/83 (03/29 2115) ?Pulse Rate: 61 (03/29 2207) ? ?Labs: ?Recent Labs  ?  10/21/21 ?1601 10/21/21 ?1746 10/21/21 ?2345 10/22/21 ?5784 10/22/21 ?0100  ?HGB 13.3  --   --   --  12.6*  ?HCT 37.9*  --   --   --  35.5*  ?PLT 187  --   --   --  160  ?HEPARINUNFRC  --   --   --  0.46  --   ?CREATININE 0.94  --   --   --   --   ?TROPONINIHS 233* 242* 257*  --   --   ? ? ? ?Estimated Creatinine Clearance: 79.1 mL/min (by C-G formula based on SCr of 0.94 mg/dL). ? ?Assessment: ?Patient with PMH of CAD presents to the ED with worsening chest pain that radiates to the left arm. Pharmacy is consulted to dose heparin. Troponin is 233, Scr is 0.94, and EKG read noted LBB. Pharmacy consulted to dose heparin. ? ?Heparin level therapeutic (0.46) on gtt at 1150 units/hr. No issues with line or bleeding reported per RN. ? ?Goal of Therapy:  ?Heparin level 0.3-0.7 units/ml ?Monitor platelets by anticoagulation protocol: Yes ?  ?Plan:  ?Continue heparin at 1150 units/hr ?F/u 6 hour confirmatory heparin level ? ?Thank you for allowing pharmacy to participate in this patient's care. ? ?Sherlon Handing, PharmD, BCPS ?Please see amion for complete clinical pharmacist phone list ?10/22/2021 2:03 AM ? ? ? ? ?

## 2021-10-22 NOTE — Interval H&P Note (Signed)
History and Physical Interval Note: ? ?10/22/2021 ?1:33 PM ? ?Scott Donaldson  has presented today for surgery, with the diagnosis of chest pain.  The various methods of treatment have been discussed with the patient and family. After consideration of risks, benefits and other options for treatment, the patient has consented to  Procedure(s): ?LEFT HEART CATH AND CORONARY ANGIOGRAPHY (N/A) as a surgical intervention.  The patient's history has been reviewed, patient examined, no change in status, stable for surgery.  I have reviewed the patient's chart and labs.  Questions were answered to the patient's satisfaction.   ? ?Cath Lab Visit (complete for each Cath Lab visit) ? ?Clinical Evaluation Leading to the Procedure:  ? ?ACS: Yes.   ? ?Non-ACS:   ? ?Anginal Classification: CCS IV ? ?Anti-ischemic medical therapy: Minimal Therapy (1 class of medications) ? ?Non-Invasive Test Results: No non-invasive testing performed ? ?Prior CABG: No previous CABG ? ? ? ? ? ? ? ?Scott Donaldson ? ? ?

## 2021-10-22 NOTE — Assessment & Plan Note (Signed)
Continue lipitor  ?

## 2021-10-22 NOTE — Progress Notes (Signed)
ANTICOAGULATION CONSULT NOTE ? ?Pharmacy Consult for heparin ?Indication: chest pain/ACS ? ?No Known Allergies ? ?Patient Measurements: ?Height: 6' (182.9 cm) ?Weight: 86.2 kg (190 lb) ?IBW/kg (Calculated) : 77.6 ?Heparin Dosing Weight: 86.2 kg ? ?Vital Signs: ?Temp: 98 ?F (36.7 ?C) (03/30 4158) ?Temp Source: Oral (03/30 3094) ?BP: 119/66 (03/30 0768) ?Pulse Rate: 55 (03/30 0807) ? ?Labs: ?Recent Labs  ?  10/21/21 ?1601 10/21/21 ?1746 10/21/21 ?2345 10/22/21 ?0881 10/22/21 ?0100 10/22/21 ?1031  ?HGB 13.3  --   --   --  12.6*  --   ?HCT 37.9*  --   --   --  35.5*  --   ?PLT 187  --   --   --  160  --   ?HEPARINUNFRC  --   --   --  0.46  --  0.47  ?CREATININE 0.94  --   --   --  0.82  --   ?TROPONINIHS 233* 242* 257*  --  265*  --   ? ? ? ?Estimated Creatinine Clearance: 90.7 mL/min (by C-G formula based on SCr of 0.82 mg/dL). ? ?Assessment: ?Patient with PMH of CAD presents to the ED with worsening chest pain that radiates to the left arm. Pharmacy consulted to dose heparin. Plan is for possible cath today.  ? ?Heparin level is 0.47 and therapeutic on 1150 units/h. Hgb and platelets are within normal limits. No signs of bleeding noted. IV site running appropriately. ? ?Goal of Therapy:  ?Heparin level 0.3-0.7 units/ml ?Monitor platelets by anticoagulation protocol: Yes ?  ?Plan:  ?Continue heparin at 1150 units/hr ?Daily heparin level, CBC ?Monitor for signs and symptoms of bleeding ?F/u plans post-cath ? ?Thank you for involving pharmacy in this patient's care. ? ?Elita Quick, PharmD ?PGY1 Ambulatory Care Pharmacy Resident ?10/22/2021 10:12 AM ? ?**Pharmacist phone directory can be found on [email or web address] listed under [place]** ? ? ? ?

## 2021-10-22 NOTE — Assessment & Plan Note (Addendum)
restart home meds as BP allows, ?Held BB for now given bradycardia ?

## 2021-10-22 NOTE — Assessment & Plan Note (Signed)
Will check orthostatics ?

## 2021-10-22 NOTE — H&P (Signed)
? ? [name] DOB: 08/14/1950 DOA: 10/21/2021 ? ?  ?PCP: [name], MD   ?Outpatient Specialists:  ?CARDS: Dr. [name] ?  ?Endocrinology Dr. Loanne Drilling ?Patient arrived to ER on 10/21/21 at 1514 ?Referred by Attending Toy Baker, MD ? ? ?Patient coming from:   ? home Lives  With family ?  ? ?Chief Complaint:   ?Chief Complaint  ?Patient presents with  ? Chest Pain  ? Dizziness  ? ? ?HPI: ?Scott Donaldson is a 71 y.o. male with medical history significant of CAD, HTN,  HLD, DM 2 on insulin ? LBBB, BPH s/p TURP. ? ?Presented with  Chest pain and feeling light headed ?Presented with left side CP, occasional unsteady gait and lightheaded for the past few days ?Reports HA and HTN  ?CP worse with exertion radiates to left arm ?Last cardiac cath was in 2019 no stents but had moderate CAD ? Now CP free, not lightheaded now ?Reports headaches are chronic and intermittent ?CP  has been on and off for few days ?Feeling unstable has been chronic ? He did have one short episode today when he had hard time saying his age but that was isolated ?Reports chronic neuropathy ? ? Initial COVID TEST  ?NEGATIVE  ? ?Lab Results  ?Component Value Date  ? [place] NEGATIVE 10/21/2021  ? [place] NEGATIVE 06/02/2020  ? [place] NEGATIVE 03/27/2020  ? ?  ?Regarding pertinent Chronic problems:   ? ? Hyperlipidemia -  on statins Lipitor and Zetia ?Lipid Panel  ?   ?Component Value Date/Time  ? CHOL 166 02/27/2021 0727  ? TRIG 233 (H) 02/27/2021 0727  ? HDL 33 (L) 02/27/2021 0727  ? CHOLHDL 5.0 02/27/2021 0727  ? CHOLHDL 5 01/22/2021 0906  ? VLDL 71.6 (H) 01/22/2021 0906  ? [place] 93 02/27/2021 0727  ? LDLDIRECT 72.0 01/22/2021 0906  ? LABVLDL 40 02/27/2021 0727  ? ? ? HTN on torpol, Benicar, NOrvasc ? ? chronic CHF diastolic  - last echo July 2022 Left ventricular diastolic parameters are  ?consistent with Grade I diastolic dysfunction  ?EF 60 to 65% ?mild calcification of the  ?aortic valve.   ? ?  CAD  - On Aspirin, statin, betablocker,   ?               -  followed by cardiology ?               - last cardiac cath 2019 ? The left ventricular systolic function is normal. ?LV end diastolic pressure is mildly elevated. ?The left ventricular ejection fraction is 55-65% by visual estimate. ?Prox RCA lesion is 20% stenosed. ?Dist RCA lesion is 40% stenosed. ?Dist LM lesion is 20% stenosed. ?Mid LAD lesion is 40% stenosed. ?  ?1.  Mild to moderate nonobstructive coronary artery disease.  Mildly calcified vessels overall. ?2.  Normal LV systolic function mildly elevated left ventricular end-diastolic pressure. ?  ?Recommendations: ?Continue aggressive medical therapy and treatment of risk factors. ?  ? ?  DM 2 -  ?Lab Results  ?Component Value Date  ? HGBA1C 10.7 (A) 08/21/2021  ? on insulin and PO meds   ?   ?  ?    ?While in ER: ?  ? ?Noted elevated troponin in 200s, ECG non acute ?Patient hyperglycemic ? ? ?Ordered ?   ?CXR -  NON acute ?  ?CTA head and neck -   . Moderate narrowing of the origin of the right vertebral artery. ?Otherwise no  hemodynamically significant stenosis in the neck. ?   No intracranial large vessel occlusion or significant stenosis. ?  No acute intracranial process ? ?Following Medications were ordered in ER: ?Medications  ?heparin ADULT infusion 100 units/mL (25000 units/230m) (1,150 Units/hr Intravenous IV Pump [place] 10/21/21 2213)  ?insulin aspart (novoLOG) injection 0-9 Units (has no administration in time range)  ?insulin aspart (novoLOG) injection 8 Units (8 Units Subcutaneous Given 10/21/21 1714)  ?meclizine (ANTIVERT) tablet 25 mg (25 mg Oral Given 10/21/21 1702)  ?metoCLOPramide (REGLAN) injection 10 mg (10 mg Intravenous Given 10/21/21 1702)  ?diphenhydrAMINE (BENADRYL) injection 25 mg (25 mg Intravenous Given 10/21/21 1702)  ?amLODipine (NORVASC) tablet 5 mg (5 mg Oral Given 10/21/21 1702)  ?aspirin tablet 325 mg (325 mg Oral Given 10/21/21 1705)  ?iohexol (OMNIPAQUE) 350  MG/ML injection 80 mL (80 mLs Intravenous Contrast Given 10/21/21 1718)  ?heparin bolus via infusion 4,000 Units (4,000 Units Intravenous Bolus from Bag 10/21/21 1823)  ?  ?_______________________________________________________ ?ER Provider Called:   Cardiology DRidgway?They Recommend admit to medicine started heparin drip and aspirin ?Will see in consult ? ?ER Provider Called:   Neurology Dr. KLeonel Ramsaylikely chronic ?Symptoms more consistent with orthostasis neurologically intact okay to give heparin ?  ?ED Triage Vitals  ?Enc Vitals Group  ?   BP 10/21/21 1523 (!) 184/80  ?   Pulse Rate 10/21/21 1523 68  ?   Resp 10/21/21 1523 18  ?   Temp 10/21/21 1523 98.4 ?F (36.9 ?C)  ?   Temp Source 10/21/21 1523 Oral  ?   SpO2 10/21/21 1523 99 %  ?   Weight 10/21/21 1540 190 lb (86.2 kg)  ?   Height 10/21/21 1540 6' (1.829 m)  ?   Head Circumference --   ?   Peak Flow --   ?   Pain Score 10/21/21 1540 6  ?   Pain Loc --   ?   Pain Edu? --   ?   Excl. in [place] --   ?TLKGM(01)@   ? _________________________________________ ?Significant initial  Findings: ?Abnormal Labs Reviewed  ?COMPREHENSIVE METABOLIC PANEL - Abnormal; Notable for the following components:  ?    Result Value  ? Sodium 132 (*)   ? Glucose, Bld 413 (*)   ? All other components within normal limits  ?CBC WITH DIFFERENTIAL/PLATELET - Abnormal; Notable for the following components:  ? HCT 37.9 (*)   ? All other components within normal limits  ?CBG MONITORING, ED - Abnormal; Notable for the following components:  ? Glucose-Capillary 189 (*)   ? All other components within normal limits  ?TROPONIN I (HIGH SENSITIVITY) - Abnormal; Notable for the following components:  ? Troponin I (High Sensitivity) 233 (*)   ? All other components within normal limits  ?TROPONIN I (HIGH SENSITIVITY) - Abnormal; Notable for the following components:  ? Troponin I (High Sensitivity) 242 (*)   ? All other components within normal limits  ? ?  _____________________ ?Troponin 233,  242 ?ECG: Ordered ?Personally reviewed by me showing: ?HR : 63 ?Rhythm:  Sinus rhythm with occasional Premature ventricular complexes with ventricular escape complexes Left axis deviation Left bundle branch block Abnormal ECG When compared with ECG of 27-Mar-2020 ?QTC 444 ?    ?The recent clinical data is shown below. ?Vitals:  ? 10/21/21 2030 10/21/21 2100 10/21/21 2115 10/21/21 2207  ?BP: (!) 125/59 140/68 138/83   ?Pulse: (!) 55   61  ?Resp: '15 16 17 14  '$ ?Temp:  98.2 ?F (36.8 ?C)  ?TempSrc:    Oral  ?SpO2: 99%   99%  ?Weight:      ?Height:      ?  ?  ?WBC ? ?   ?Component Value Date/Time  ? WBC 6.5 10/21/2021 1601  ? LYMPHSABS 2.0 10/21/2021 1601  ? LYMPHSABS 2.6 07/06/2018 1129  ? MONOABS 0.4 10/21/2021 1601  ? EOSABS 0.1 10/21/2021 1601  ? EOSABS 0.2 07/06/2018 1129  ? BASOSABS 0.0 10/21/2021 1601  ? BASOSABS 0.0 07/06/2018 1129  ?  ? ?Results for orders placed or performed during the hospital encounter of 10/21/21  ?Resp Panel by RT-PCR (Flu A&B, Covid) Nasopharyngeal Swab     Status: None  ? Collection Time: 10/21/21  5:47 PM  ? Specimen: Nasopharyngeal Swab; Nasopharyngeal(NP) swabs in vial transport medium  ?Result Value Ref Range Status  ? SARS Coronavirus 2 by RT PCR NEGATIVE NEGATIVE Final  ?      ? Influenza A by PCR NEGATIVE NEGATIVE Final  ? Influenza B by PCR NEGATIVE NEGATIVE Final  ?      ?  ?_______________________________________________ ?Hospitalist was called for admission for   NSTEMI (non-ST elevated myocardial infarction) ([place]) ?Hyperglycemia, Dizziness ?   ? ?The following Work up has been ordered so far: ? ?Orders Placed This Encounter  ?Procedures  ? Resp Panel by RT-PCR (Flu A&B, Covid) Nasopharyngeal Swab  ? DG Chest 2 View  ? CT ANGIO HEAD NECK W WO CM  ? Comprehensive metabolic panel  ? CBC with Differential  ? Heparin level (unfractionated)  ? Heparin level (unfractionated)  ? CBC  ? Hemoglobin A1c  ? Diet NPO time specified  ? Cardiac monitoring  ? STAT CBG when hypoglycemia is  suspected. If treated, recheck every 15 minutes after each treatment until CBG >/= 70 mg/dl  ? Refer to Hypoglycemia Protocol Sidebar Report for treatment of CBG < 70 mg/dl  ? No basal insulin at this

## 2021-10-22 NOTE — Progress Notes (Addendum)
? ?The patient has been seen in conjunction with Reino Bellis, NP. All aspects of care have been considered and discussed. The patient has been personally interviewed, examined, and all clinical data has been reviewed. ? ?Preliminary echo with inferobasal WMA. ?Symptoms and elevated Ti  warrant cor angio and revasc. if indicated. ?The patient was counseled to undergo left heart catheterization, coronary angiography, and possible percutaneous coronary intervention with stent implantation. The procedural risks and benefits were discussed in detail. The risks discussed included death, stroke, myocardial infarction, life-threatening bleeding, limb ischemia, kidney injury, allergy, and possible emergency cardiac surgery. The risk of these significant complications were estimated to occur less than 1% of the time. After discussion, the patient has agreed to proceed. ? ? ? ?Progress Note ? ?Patient Name: Scott Donaldson ?Date of Encounter: 10/22/2021 ? ?[facility] Cardiologist: Jenkins Rouge, MD  ? ?Subjective  ? ?No chest pain. Echo at the bedside ? ?Inpatient Medications  ?  ?Scheduled Meds: ? amLODipine  5 mg Oral Daily  ? aspirin  81 mg Oral Daily  ? atorvastatin  40 mg Oral Daily  ? ezetimibe  10 mg Oral Daily  ? insulin aspart  0-9 Units Subcutaneous Q4H  ? insulin glargine  25 Units Subcutaneous Q2200  ? sodium chloride flush  3 mL Intravenous Q12H  ? ?Continuous Infusions: ? sodium chloride    ? heparin 1,150 Units/hr (10/22/21 0600)  ? nitroGLYCERIN    ? ?PRN Meds: ?sodium chloride, acetaminophen **OR** acetaminophen, HYDROcodone-acetaminophen, morphine injection, sodium chloride flush  ? ?Vital Signs  ?  ?Vitals:  ? 10/21/21 2115 10/21/21 2207 10/22/21 0441 10/22/21 0807  ?BP: 138/83  102/78 119/66  ?Pulse:  61 (!) 51 (!) 55  ?Resp: '17 14 19 17  '$ ?Temp:  98.2 ?F (36.8 ?C) 98.2 ?F (36.8 ?C) 98 ?F (36.7 ?C)  ?TempSrc:  Oral Oral Oral  ?SpO2:  99% 97% 95%  ?Weight:      ?Height:      ? ? ?Intake/Output  Summary (Last 24 hours) at 10/22/2021 2353 ?Last data filed at 10/22/2021 [phone] ?Gross per 24 hour  ?Intake --  ?Output 700 ml  ?Net -700 ml  ? ? ?  10/21/2021  ?  3:40 PM 08/21/2021  ?  8:24 AM 07/29/2021  ? 11:30 AM  ?Last 3 Weights  ?Weight (lbs) 190 lb 193 lb 194 lb 3.2 oz  ?Weight (kg) 86.183 kg 87.544 kg 88.089 kg  ?   ? ?Telemetry  ?  ?SB rates 50s, PVCs - Personally Reviewed ? ?ECG  ?  ?SB, 52 bpm PVCs LBBB - Personally Reviewed ? ?Physical Exam  ? ?GEN: No acute distress.   ?Neck: No JVD ?Cardiac: RRR, 2/6 systolic murmur, no rubs, or gallops.  ?Respiratory: Clear to auscultation bilaterally. ?GI: Soft, nontender, non-distended  ?MS: No edema; No deformity. ?Neuro:  Nonfocal  ?Psych: Normal affect  ? ?Labs  ?  ?High Sensitivity Troponin:   ?Recent Labs  ?Lab 10/21/21 ?1601 10/21/21 ?1746 10/21/21 ?2345 10/22/21 ?0100  ?TROPONINIHS 233* 242* 257* 265*  ?   ?Chemistry ?Recent Labs  ?Lab 10/21/21 ?1601 10/22/21 ?0100  ?NA 132* 139  ?K 3.8 3.7  ?CL 99 105  ?CO2 24 27  ?GLUCOSE 413* 270*  ?BUN 22 15  ?CREATININE 0.94 0.82  ?CALCIUM 9.2 9.3  ?MG  --  1.6*  ?PROT 7.2 6.5  ?ALBUMIN 4.0 3.6  ?AST 19 17  ?ALT 17 18  ?ALKPHOS 76 54  ?BILITOT 0.6 0.6  ?GFRNONAA >60 >  60  ?ANIONGAP 9 7  ?  ?Lipids  ?Recent Labs  ?Lab 10/22/21 ?0100  ?CHOL 125  ?TRIG 74  ?HDL 44  ?[place] 66  ?CHOLHDL 2.8  ?  ?Hematology ?Recent Labs  ?Lab 10/21/21 ?1601 10/22/21 ?0100  ?WBC 6.5 5.3  ?RBC 4.52 4.26  ?HGB 13.3 12.6*  ?HCT 37.9* 35.5*  ?MCV 83.8 83.3  ?MCH 29.4 29.6  ?MCHC 35.1 35.5  ?RDW 12.5 12.5  ?PLT 187 160  ? ?Thyroid  ?Recent Labs  ?Lab 10/22/21 ?9381  ?TSH 4.528*  ?  ?BNP ?Recent Labs  ?Lab 10/22/21 ?0100  ?BNP 108.2*  ?  ?DDimer No results for input(s): DDIMER in the last 168 hours.  ? ?Radiology  ?  ?CT ANGIO HEAD NECK W WO CM ? ?Result Date: 10/21/2021 ?CLINICAL DATA:  Dizziness, persistent or recurrent, left chest pain EXAM: CT ANGIOGRAPHY HEAD AND NECK TECHNIQUE: Multidetector CT imaging of the head and neck was performed using the  standard protocol during bolus administration of intravenous contrast. Multiplanar CT image reconstructions and MIPs were obtained to evaluate the vascular anatomy. Carotid stenosis measurements (when applicable) are obtained utilizing NASCET criteria, using the distal internal carotid diameter as the denominator. RADIATION DOSE REDUCTION: This exam was performed according to the departmental dose-optimization program which includes automated exposure control, adjustment of the mA and/or kV according to patient size and/or use of iterative reconstruction technique. CONTRAST:  15m OMNIPAQUE IOHEXOL 350 MG/ML SOLN COMPARISON:  No prior CTA, correlation is made with CT head 03/27/2020. FINDINGS: CT HEAD FINDINGS Brain: No evidence of acute infarction, hemorrhage, cerebral edema, mass, mass effect, or midline shift. No hydrocephalus or extra-axial fluid collection. Vascular: No hyperdense vessel. Skull: Normal. Negative for fracture or focal lesion. Sinuses/Orbits: No acute finding. Other: The mastoid air cells are well aerated. CTA NECK FINDINGS Aortic arch: Standard branching. Imaged portion shows no evidence of aneurysm or dissection. No significant stenosis of the major arch vessel origins. Aortic atherosclerosis. Right carotid system: No evidence of dissection, stenosis (50% or greater) or occlusion. Plaque in the common carotid, at the bifurcation, and in the proximal right ICA is not hemodynamically significant. Left carotid system: No evidence of dissection, stenosis (50% or greater) or occlusion. Plaque in the common carotid, at the bifurcation, and in the proximal left ICA is not hemodynamically significant. Vertebral arteries: Left dominant. At the origin of the right vertebral artery, there is significant calcification, which extends into the proximal V1 segment, which appears to be moderately stenosed. Otherwise no significant stenosis in the vertebral arteries. No evidence of dissection or occlusion.  Skeleton: No acute osseous abnormality. Other neck: Negative. Upper chest: Emphysema. No focal pulmonary opacity or pleural effusion. Review of the MIP images confirms the above findings CTA HEAD FINDINGS Anterior circulation: Both internal carotid arteries are patent to the termini, without significant stenosis. Mild calcifications in the supraclinoid ICAs are not hemodynamically significant. A1 segments patent with a hypoplastic right A1. Normal anterior communicating artery. Anterior cerebral arteries are patent to their distal aspects. No M1 stenosis or occlusion. Normal MCA bifurcations. Distal MCA branches perfused and symmetric. Posterior circulation: Vertebral arteries patent to the vertebrobasilar junction without stenosis. Posterior inferior cerebral arteries patent bilaterally. Basilar patent to its distal aspect. Superior cerebellar arteries patent bilaterally. Patent P1 segments. PCAs perfused to their distal aspects without stenosis. The bilateral posterior communicating arteries are not visualized. Venous sinuses: As permitted by contrast timing, patent. Anatomic variants: None significant. Review of the MIP images confirms the above findings IMPRESSION:  1. Moderate narrowing of the origin of the right vertebral artery. Otherwise no hemodynamically significant stenosis in the neck. 2.  No intracranial large vessel occlusion or significant stenosis. 3.  No acute intracranial process. 4. Aortic Atherosclerosis (ICD10-I70.0) and Emphysema (ICD10-J43.9). Electronically Signed   By: Merilyn Baba M.D.   On: 10/21/2021 17:46  ? ?DG Chest 2 View ? ?Result Date: 10/21/2021 ?CLINICAL DATA:  71 y./o male. Pt c/o LT CP for some time; reports unsteady gait and lightheadedness for several days; c/o HA and HTNCP EXAM: CHEST - 2 VIEW COMPARISON:  None. FINDINGS: Normal mediastinum and cardiac silhouette. Normal pulmonary vasculature. No evidence of effusion, infiltrate, or pneumothorax. No acute bony abnormality.  Degenerative osteophytosis of the spine. IMPRESSION: No acute cardiopulmonary process. Electronically Signed   By: Suzy Bouchard M.D.   On: 10/21/2021 16:20   ? ?Cardiac Studies  ? ?Echo: pending ? ?Patient Profil

## 2021-10-22 NOTE — Consult Note (Signed)
? ?Cardiology Consult  ?  ?Patient ID: Scott Donaldson ?MRN: [id number], DOB/AGE: Aug 05, 1950  ? ?Admit date: 10/21/2021 ?Date of Consult: 10/22/2021 ?Requesting Provider: Toy Baker ? ?PCP:  [name], MD ?  ?[facility] Providers ?Cardiologist:  Jenkins Rouge, MD   {   ? ?Patient Profile  ?  ?Scott Donaldson is a 71 y.o. male with a history of type 2 DM, HTN, HLD, nonobstructive CAD, LBBB, and BPH s/p TURP. He is being seen today (10/22/2021) for the evaluation of chest pain.  ? ?History of Present Illness  ?  ?He woke up this morning around 6 am with chest pain with radiation to his arm that he describes more as numbness that pain. He has had chronic exertional dyspnea over the last few years, but today was significantly worse to the point of needing to rest after walking across the room. He isn't really sure if the pain gets worse with ambulation. Pain is left-sided, not pleuritic or positional. He also endorses mild nausea but not vomiting and has this from time to time at baseline. Before today he denies any prior history of chest pain.  ? ?He endorses orthostatic dizziness and headaches, but states these are both chronic for him, maybe worse then usual today. ROS otherwise positive for mild stable orthopnea and mild nonproductive cough intermittently that is unchanged. Denies fevers, rashes, recent illness. Quit smoking around 30 years ago. Father and brother with unclear heart issues, he thinks MI but over age 83.  ? ?ED evaluation notable for initial BP 184/80, unchanged ECG with known LBBB and PVCs, troponin 233->242, and glucose 413. There was some concern for stroke due to the dizziness so he underwent CTA head that showed moderate vertebral artery stenosis that neurology confirmed appears chronic and neuro exam was normal. CXR was normal. He was started on heparin and transferred for further evaluation with presumed ACS. On arrival is stable and states he is still having about  3/10 chest/arm pain, but much improved. ? ?Past Medical History  ? ?Past Medical History:  ?Diagnosis Date  ? Abdominal pain 08/04/2016  ? Abnormal PSA 05/19/2015  ? Anemia   ? Arthralgia 11/12/2010  ? Benign prostatic hyperplasia with urinary hesitancy 06/14/2018  ? Cataract   ? Coronary artery disease   ? nonobstructive  ? Diabetes ([place]) 03/04/2007  ? Qualifier: Diagnosis of  By: Loanne Drilling MD, Jacelyn Pi   ? DIABETES MELLITUS, TYPE II 03/04/2007  ? Elevated LDL cholesterol level 06/14/2018  ? Foot ulcer, right ([place]) 04/15/2014  ? GERD (gastroesophageal reflux disease)   ? HOH (hard of hearing)   ? both ears no hearing aides  ? HYPERCHOLESTEROLEMIA 07/29/2008  ? HYPERTENSION 03/04/2007  ? Knee pain, left 05/16/2015  ? Nonintractable episodic headache 12/01/2016  ? migraine  ? SHOULDER PAIN, BILATERAL 01/12/2010  ? Qualifier: Diagnosis of  By: Loanne Drilling MD, Jacelyn Pi   ? 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 Qualifier: Diagnosis of  By: Loanne Drilling MD, Jacelyn Pi   ?  ?Past Surgical History:  ?Procedure Laterality Date  ? COLONOSCOPY  07/2017  ? LEFT HEART CATH AND CORONARY ANGIOGRAPHY N/A 07/12/2018  ? Procedure: LEFT HEART CATH AND CORONARY ANGIOGRAPHY;  Surgeon: Wellington Hampshire, MD;  Location: [facility];  Service: Cardiovascular;  Laterality: N/A;  ? stress cardiolite  05/30/2003  ? TRANSURETHRAL RESECTION OF PROSTATE N/A 06/04/2020  ? Procedure: TRANSURETHRAL RESECTION OF THE PROSTATE (TURP), BIPOLAR;  Surgeon: Ceasar Mons, MD;  Location: [facility];  Service: Urology;  Laterality: N/A;  ?  ? ?No Known Allergies ?Inpatient Medications  ?  ? insulin aspart  0-9 Units Subcutaneous Q4H  ? ? ?Family History  ?  ?Family History  ?Problem Relation Age of Onset  ? Cancer Mother   ?     Breast Cancer  ? Cancer Father   ?     uncertain type  ? Diabetes Father   ? Cancer Brother 73  ?     Colon Cancer  ? Colon cancer Neg Hx   ? Rectal cancer Neg Hx   ? Stomach cancer Neg Hx   ? ?He indicated that his mother is  deceased. He indicated that his father is deceased. He indicated that the status of his brother is unknown. He indicated that his maternal grandmother is deceased. He indicated that his maternal grandfather is deceased. He indicated that his paternal grandmother is deceased. He indicated that his paternal grandfather is deceased. He indicated that the status of his neg hx is unknown. ? ? ?Social History  ?  ?Social History  ? ?Socioeconomic History  ? Marital status: Single  ?  Spouse name: Not on file  ? Number of children: 2  ? Years of education: 10  ? Highest education level: Not on file  ?Occupational History  ? Occupation: roofer  ?Tobacco Use  ? Smoking status: Former  ?  Packs/day: 1.50  ?  Years: 25.00  ?  Pack years: 37.50  ?  Types: Cigarettes  ?  Quit date: 07/26/1988  ?  Years since quitting: 33.2  ? Smokeless tobacco: Never  ?Vaping Use  ? Vaping Use: Never used  ?Substance and Sexual Activity  ? Alcohol use: No  ? Drug use: No  ? Sexual activity: Not on file  ?Other Topics Concern  ? Not on file  ?Social History Narrative  ? Lives with daughter in a one story home.  Has 2 children.  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  Education: 10th grade.   ? ?Social Determinants of Health  ? ?Financial Resource Strain: Not on file  ?Food Insecurity: Not on file  ?Transportation Needs: Not on file  ?Physical Activity: Not on file  ?Stress: Not on file  ?Social Connections: Not on file  ?Intimate Partner Violence: Not on file  ?  ? ?Review of Systems  ?  ?A comprehensive review of systems was performed with pertinent positives and negatives noted in the HPI. ? ?Physical Exam  ?  ?Blood pressure 138/83, pulse 61, temperature 98.2 ?F (36.8 ?C), temperature source Oral, resp. rate 14, height 6' (1.829 m), weight 86.2 kg, SpO2 99 %.  ?  ?No intake or output data in the 24 hours ending 10/22/21 0015 ?Wt Readings from Last 3 Encounters:  ?10/21/21 86.2 kg  ?08/21/21 87.5 kg  ?07/29/21 88.1 kg  ? ? ?CONSTITUTIONAL: alert and  conversant, well-appearing, nourished, no distress ?HEENT: normal ?NECK: symmetric, no JVD, no masses, carotid pulses appear bounding ?CARDIAC: Regular underlying rhythm with intermittent PVCs on monitor. Mid-peaking 3/6 systolic murmur with vibratory quality, radiates to carotids. No S3/S4. No friction rub.  ?VASCULAR: Radial pulses intact bilaterally. No carotid bruits. ?PULMONARY/CHEST WALL: no deformities, normal breath sounds bilaterally, normal work of breathing ?ABDOMINAL: soft, non-tender, non-distended ?EXTREMITIES: no edema, no muscle atrophy, warm and well-perfused ?SKIN: Dry and intact without apparent rashes or wounds. No peripheral cyanosis. ?NEUROLOGIC: alert, no abnormal movements, cranial nerves grossly intact. ?PSYCH: normal affect, normal speech and  language ? ? ?Labs  ?  ?Recent Labs  ?  10/21/21 ?1601 10/21/21 ?1746  ?TROPONINIHS 233* 242*  ? ?Lab Results  ?Component Value Date  ? WBC 6.5 10/21/2021  ? HGB 13.3 10/21/2021  ? HCT 37.9 (L) 10/21/2021  ? MCV 83.8 10/21/2021  ? PLT 187 10/21/2021  ?  ?Recent Labs  ?Lab 10/21/21 ?1601  ?NA 132*  ?K 3.8  ?CL 99  ?CO2 24  ?BUN 22  ?CREATININE 0.94  ?CALCIUM 9.2  ?PROT 7.2  ?BILITOT 0.6  ?ALKPHOS 76  ?ALT 17  ?AST 19  ?GLUCOSE 413*  ? ?Lab Results  ?Component Value Date  ? CHOL 166 02/27/2021  ? HDL 33 (L) 02/27/2021  ? [place] 93 02/27/2021  ? TRIG 233 (H) 02/27/2021  ? ?  ?Radiology Studies  ?  ?CT ANGIO HEAD NECK W WO CM ?Result Date: 10/21/2021 ?IMPRESSION: 1. Moderate narrowing of the origin of the right vertebral artery. Otherwise no hemodynamically significant stenosis in the neck. 2.  No intracranial large vessel occlusion or significant stenosis. 3.  No acute intracranial process. 4. Aortic Atherosclerosis. 5. Emphysema.  ? ?DG Chest 2 View ?Result Date: 10/21/2021 ?FINDINGS: Normal mediastinum and cardiac silhouette. Normal pulmonary vasculature. No evidence of effusion, infiltrate, or pneumothorax. No acute bony abnormality. Degenerative  osteophytosis of the spine.  ?IMPRESSION: No acute cardiopulmonary process.  ? ?ECG & Cardiac Imaging  ?  ?ECG: Sinus rhythm with frequent PVCs, LBBB with baseline repolarization abnormality, poor R wave progr

## 2021-10-23 ENCOUNTER — Telehealth: Payer: Self-pay | Admitting: Physician Assistant

## 2021-10-23 ENCOUNTER — Other Ambulatory Visit (HOSPITAL_COMMUNITY): Payer: Self-pay

## 2021-10-23 ENCOUNTER — Ambulatory Visit: Payer: Medicare Other | Admitting: Endocrinology

## 2021-10-23 ENCOUNTER — Encounter (HOSPITAL_COMMUNITY): Payer: Self-pay | Admitting: Internal Medicine

## 2021-10-23 DIAGNOSIS — I214 Non-ST elevation (NSTEMI) myocardial infarction: Secondary | ICD-10-CM | POA: Diagnosis not present

## 2021-10-23 LAB — BASIC METABOLIC PANEL
Anion gap: 7 (ref 5–15)
BUN: 18 mg/dL (ref 8–23)
CO2: 23 mmol/L (ref 22–32)
Calcium: 8.8 mg/dL — ABNORMAL LOW (ref 8.9–10.3)
Chloride: 105 mmol/L (ref 98–111)
Creatinine, Ser: 0.92 mg/dL (ref 0.61–1.24)
GFR, Estimated: >60 mL/min (ref >60)
Glucose, Bld: 163 mg/dL — ABNORMAL HIGH (ref 70–99)
Potassium: 3.8 mmol/L (ref 3.5–5.1)
Sodium: 135 mmol/L (ref 135–145)

## 2021-10-23 LAB — GLUCOSE, CAPILLARY
Glucose-Capillary: 161 mg/dL — ABNORMAL HIGH (ref 70–99)
Glucose-Capillary: 305 mg/dL — ABNORMAL HIGH (ref 70–99)

## 2021-10-23 MED ORDER — METFORMIN HCL ER (MOD) 1000 MG PO TB24: 1000.0000 mg | ORAL_TABLET | Freq: Every day | ORAL | 0 refills | Status: DC

## 2021-10-23 MED ORDER — METOPROLOL SUCCINATE ER 25 MG PO TB24: 12.5000 mg | ORAL_TABLET | Freq: Every day | ORAL | 0 refills | Status: DC

## 2021-10-23 MED ORDER — LANTUS SOLOSTAR 100 UNIT/ML ~~LOC~~ SOPN: 25.0000 [IU] | PEN_INJECTOR | Freq: Every day | SUBCUTANEOUS | 0 refills | Status: DC

## 2021-10-23 MED ORDER — ATORVASTATIN CALCIUM 80 MG PO TABS: 80.0000 mg | ORAL_TABLET | Freq: Every day | ORAL | Status: DC

## 2021-10-23 MED ORDER — DAPAGLIFLOZIN PROPANEDIOL 10 MG PO TABS: 10.0000 mg | ORAL_TABLET | Freq: Every day | ORAL | 0 refills | Status: DC

## 2021-10-23 MED ORDER — TICAGRELOR 90 MG PO TABS: 90.0000 mg | ORAL_TABLET | Freq: Two times a day (BID) | ORAL | 0 refills | Status: AC

## 2021-10-23 MED ORDER — METOPROLOL SUCCINATE ER 25 MG PO TB24
12.5000 mg | ORAL_TABLET | Freq: Every day | ORAL | Status: DC
  Administered 2021-10-23: 12.5 mg via ORAL
  Filled 2021-10-23: qty 1

## 2021-10-23 MED ORDER — INSULIN GLARGINE-YFGN 100 UNIT/ML ~~LOC~~ SOLN
25.0000 [IU] | Freq: Every day | SUBCUTANEOUS | Status: DC
  Filled 2021-10-23: qty 0.25

## 2021-10-23 MED ORDER — INSULIN STARTER KIT- PEN NEEDLES (ENGLISH): 1.0000 | Freq: Once | 0 refills | Status: AC

## 2021-10-23 MED ORDER — ATORVASTATIN CALCIUM 80 MG PO TABS: 80.0000 mg | ORAL_TABLET | Freq: Every day | ORAL | 0 refills | Status: DC

## 2021-10-23 MED ORDER — EZETIMIBE 10 MG PO TABS: 10.0000 mg | ORAL_TABLET | Freq: Every day | ORAL | 0 refills | Status: DC

## 2021-10-23 MED ORDER — DAPAGLIFLOZIN PROPANEDIOL 10 MG PO TABS
10.0000 mg | ORAL_TABLET | Freq: Every day | ORAL | Status: DC
Start: 2021-10-23 — End: 2021-10-23
  Administered 2021-10-23: 10 mg via ORAL
  Filled 2021-10-23: qty 1

## 2021-10-23 MED ORDER — INSULIN STARTER KIT- PEN NEEDLES (ENGLISH)
1.0000 | Freq: Once | Status: DC
  Filled 2021-10-23: qty 1

## 2021-10-23 MED ORDER — MAGNESIUM OXIDE 400 MG PO CAPS
400.0000 mg | ORAL_CAPSULE | Freq: Every day | ORAL | 0 refills | Status: AC
Start: 2021-10-23 — End: 2021-10-28

## 2021-10-23 MED FILL — Nitroglycerin IV Soln 100 MCG/ML in D5W: INTRA_ARTERIAL | Qty: 10 | Status: AC

## 2021-10-23 NOTE — Research (Signed)
ID- [id number],  SOS-AMI ? ?Authorized Site Personnel  '[x]'$   Shirley Muscat, RN ?    '[]'$   Jasmine Pang, RN  ?'[]'$   Philemon Kingdom, RN  ?'[]'$   Amy Enid Derry, RCIS  ?'[]'$   Berneda Rose, RN  ? ?SUBJECT NAME: _[name] ?MRN: _[id number]___ ? ? ?Date of study introduction: _31-MAR-2023_   ([id number]) ?   Time of introduction: __09:45_______  (24 hour clock) ? ?During the subject's hospital visit , the authorized site personnel discussed with the subject the possibility to participate in the SOS-AMI study, and provided  ?the subject with the approved Subject Information Leaflet (SIL)-Informed Consent ?form (ICF) in a language that he can understand.  ?The subject took the document home to reflect on his participation ?in this study before signing it.  ? ?'[x]'$   The Subject will return to the Cardiovascular Research office, at a later day, to  ?          complete the consenting process.  ?OR ? ?'[]'$   The Subject will be given ample time to reflect on this study but will be completing  ?          consent process prior to his/ her discharge from this admission. ? ? ?*Form based on [employer]ORSIA [id number]  ?

## 2021-10-23 NOTE — Progress Notes (Addendum)
Inpatient Diabetes Program Recommendations ? ?AACE/ADA: New Consensus Statement on Inpatient Glycemic Control (2015) ? ?Target Ranges:  Prepandial:   less than 140 mg/dL ?     Peak postprandial:   less than 180 mg/dL (1-2 hours) ?     Critically ill patients:  140 - 180 mg/dL  ? ?Lab Results  ?Component Value Date  ? GLUCAP 161 (H) 10/23/2021  ? HGBA1C 10.4 (H) 10/22/2021  ? ? ?Review of Glycemic Control ? ?Diabetes history: type 2 ?Outpatient Diabetes medications: Novolin 70/30 insulin 80 units daily (just increased from 40 units 3 months ago), Metformin XR 2000 mg BID ?Current orders for Inpatient glycemic control: Semglee 25 units at HS, Novolog SENSITIVE correction scale TID & 0-5 units HS scale ? ?Inpatient Diabetes Program Recommendations:   ?Spoke with patient about his diabetes. States that he was diagnosed about 30 years ago. Sees Dr. Loanne Drilling as his endocrinologist. Recently, Dr. Loanne Drilling increased 70/30 insulin from 40 units to 80 units once a day. He usually takes it at dinner time. He usually only eats dinner each day, except for the weekends when he eats breakfast and dinner. States that he has low blood sugars in the night with symptoms. Has been as low as 54 mg/dl.  ? ?States that he is taking Novolin 70/30 insulin due to cost. Has used insulin pens in the past, but has trouble with injecting into abdomen. States that he moves it around in a general area on either side of abdomen. Possibly having adhesions that may cause the insulin to not be absorbed the way it should. Recommend rotating sites from day to day to arms and outer thighs.  ? ?Reviewed insulin administration with insulin pen. Patient was able to demonstrate without issues. Understood the method.  ? ?Patient has been on Semglee 25 units at HS here in the hospital. CBGs have been within normal limits. Recommend patient be discharged on Lantus 25 units daily.  Patient has blood glucose meter, strips, and lancets at home and checks blood  sugars 2-3 times per day.  ? ?Benefit check and insurance verification for [employer] completed by pharmacy tech. See note. Patient is agreeable to cost as quoted.  ? ?Will continue to monitor blood sugars while in the hospital. ? ?Harvel Ricks RN BSN CDE ?Diabetes Coordinator ?Pager: [phone]  8am-5pm  ? ? ? ? ? ? ? ?

## 2021-10-23 NOTE — TOC Benefit Eligibility Note (Signed)
Patient Advocate Encounter  Insurance verification completed.    The patient is currently admitted and upon discharge could be taking Brilinta 90 mg.  The current 30 day co-pay is, $47.00.   The patient is insured through [employer] Medicare Part D    Cyril Railey, CPhT Pharmacy Patient Advocate Specialist [facility] Patient Advocate Team Direct Number: [phone]  Fax: [phone]        

## 2021-10-23 NOTE — Telephone Encounter (Signed)
? ? ?  Attention TOC pool, ? ?This patient will need a TOC phone call after discharge. They are being discharged today. ?Follow-up appointment has already been arranged with: Laurann Montana @ [facility] on Tuesday Nov 03, 2021 10:05 AM. Discussed with patient on hospital phone line that this is at the [facility] location not [facility], he is agreeable. ?They are a patient of Jenkins Rouge, MD so sending to [facility] pool. ? ?Thank you! ?Charlie Pitter, PA-C ? ?

## 2021-10-23 NOTE — Progress Notes (Signed)
SATURATION QUALIFICATIONS: (This note is used to comply with regulatory documentation for home oxygen) ? ?Patient Saturations on Room Air at Rest = 99% ? ?Patient Saturations on Room Air while Ambulating = 95% ? ? ? ?Please briefly explain why patient needs home oxygen: Pt did not require supplemental oxygen to maintain adequate oxygen sats.  ? ?Scott Donaldson, PT, DPT  ?Acute Rehabilitation Services  ?Pager: [phone] ?Office: [phone] ? ?

## 2021-10-23 NOTE — Evaluation (Signed)
Physical Therapy Evaluation ?Patient Details ?Name: Scott Donaldson ?MRN: [id number] ?DOB: 1951/01/09 ?Today's Date: 10/23/2021 ? ?History of Present Illness ? Pt is a 71 y/o male admitted secondary to NSTEMI and is s/p heart cath. PMH includes CAD, DM, HTN, and dCHF.  ?Clinical Impression ? Pt admitted secondary to problem above with deficits below. Pt requiring min guard to supervision for mobility tasks. Mild unsteadiness without use of AD. Balance improved with use of RW. Educated about using at home to increase safety. Anticipate pt will progress well and will not require follow up PT. No further skilled PT needs at this time. Will sign off. If needs change, please re-consult.    ?   ? ?Recommendations for follow up therapy are one component of a multi-disciplinary discharge planning process, led by the attending physician.  Recommendations may be updated based on patient status, additional functional criteria and insurance authorization. ? ?Follow Up Recommendations No PT follow up ? ?  ?Assistance Recommended at Discharge Intermittent Supervision/Assistance  ?Patient can return home with the following ? Assist for transportation;Assistance with cooking/housework ? ?  ?Equipment Recommendations Rolling walker (2 wheels)  ?Recommendations for Other Services ?    ?  ?Functional Status Assessment Patient has had a recent decline in their functional status and demonstrates the ability to make significant improvements in function in a reasonable and predictable amount of time.  ? ?  ?Precautions / Restrictions Precautions ?Precautions: None ?Restrictions ?Weight Bearing Restrictions: No  ? ?  ? ?Mobility ? Bed Mobility ?  ?  ?  ?  ?  ?  ?  ?General bed mobility comments: Sitting in EOB ?  ? ?Transfers ?Overall transfer level: Needs assistance ?Equipment used: None ?Transfers: Sit to/from Stand ?Sit to Stand: Supervision ?  ?  ?  ?  ?  ?General transfer comment: Supervision for safety. ?   ? ?Ambulation/Gait ?Ambulation/Gait assistance: Min guard, Supervision ?Gait Distance (Feet): 175 Feet ?Assistive device: None, Rolling walker (2 wheels) ?Gait Pattern/deviations: Step-through pattern, Decreased stride length ?Gait velocity: Decreased ?  ?  ?General Gait Details: Very guarded steps without use of RW and required min guard for safety. Pt with increased steadiness with use of RW and requiring supervision. ? ?Stairs ?Stairs: Yes ?Stairs assistance: Min guard ?Stair Management: One rail Right, Step to pattern, Forwards ?Number of Stairs: 4 ?General stair comments: Overall steady stair navigation. No LOB noted. Cues for sequencing. ? ?Wheelchair Mobility ?  ? ?Modified Rankin (Stroke Patients Only) ?  ? ?  ? ?Balance Overall balance assessment: Mild deficits observed, not formally tested ?  ?  ?  ?  ?  ?  ?  ?  ?  ?  ?  ?  ?  ?  ?  ?  ?  ?  ?   ? ? ? ?Pertinent Vitals/Pain Pain Assessment ?Pain Assessment: Faces ?Faces Pain Scale: Hurts little more ?Pain Location: R knee>L knee ?Pain Descriptors / Indicators: Grimacing, Guarding ?Pain Intervention(s): Limited activity within patient's tolerance, Monitored during session, Repositioned  ? ? ?Home Living Family/patient expects to be discharged to:: Private residence ?Living Arrangements: Children ?Available Help at Discharge: Family ?Type of Home: House ?Home Access: Stairs to enter ?Entrance Stairs-Rails: Right;Left;Can reach both ?Entrance Stairs-Number of Steps: 4 ?  ?Home Layout: One level ?Home Equipment: Cane - single point;Crutches ?   ?  ?Prior Function Prior Level of Function : Independent/Modified Independent ?  ?  ?  ?  ?  ?  ?  ?  ?  ? ? ?  Hand Dominance  ?   ? ?  ?Extremity/Trunk Assessment  ? Upper Extremity Assessment ?Upper Extremity Assessment: Overall WFL for tasks assessed ?  ? ?Lower Extremity Assessment ?Lower Extremity Assessment: Generalized weakness (RLE>LLE knee pain) ?  ? ?Cervical / Trunk Assessment ?Cervical / Trunk Assessment:  Normal  ?Communication  ? Communication: No difficulties  ?Cognition Arousal/Alertness: Awake/alert ?Behavior During Therapy: [facility] for tasks assessed/performed ?Overall Cognitive Status: Within Functional Limits for tasks assessed ?  ?  ?  ?  ?  ?  ?  ?  ?  ?  ?  ?  ?  ?  ?  ?  ?  ?  ?  ? ?  ?General Comments General comments (skin integrity, edema, etc.): VSS ? ?  ?Exercises    ? ?Assessment/Plan  ?  ?PT Assessment Patient does not need any further PT services  ?PT Problem List   ? ?   ?  ?PT Treatment Interventions     ? ?PT Goals (Current goals can be found in the Care Plan section)  ?Acute Rehab PT Goals ?Patient Stated Goal: to go home ?PT Goal Formulation: With patient ?Time For Goal Achievement: 10/23/21 ?Potential to Achieve Goals: Good ? ?  ?Frequency   ?  ? ? ?Co-evaluation   ?  ?  ?  ?  ? ? ?  ?AM-PAC PT "6 Clicks" Mobility  ?Outcome Measure Help needed turning from your back to your side while in a flat bed without using bedrails?: None ?Help needed moving from lying on your back to sitting on the side of a flat bed without using bedrails?: None ?Help needed moving to and from a bed to a chair (including a wheelchair)?: None ?Help needed standing up from a chair using your arms (e.g., wheelchair or bedside chair)?: None ?Help needed to walk in hospital room?: A Little ?Help needed climbing 3-5 steps with a railing? : A Little ?6 Click Score: 22 ? ?  ?End of Session Equipment Utilized During Treatment: Gait belt ?Activity Tolerance: Patient tolerated treatment well ?Patient left: in chair;with call bell/phone within reach ?Nurse Communication: Mobility status ?PT Visit Diagnosis: Other abnormalities of gait and mobility (R26.89) ?  ? ?Time: 8016-5537 ?PT Time Calculation (min) (ACUTE ONLY): 16 min ? ? ?Charges:   PT Evaluation ?$PT Eval Low Complexity: 1 Low ?  ?  ?   ? ? ?Reuel Derby, PT, DPT  ?Acute Rehabilitation Services  ?Pager: [phone] ?Office: [phone] ? ? ?[place] ?10/23/2021, 10:25 AM ? ?

## 2021-10-23 NOTE — Progress Notes (Addendum)
? ?Progress Note ? ?Patient Name: Scott Donaldson ?Date of Encounter: 10/23/2021 ? ?[facility] Cardiologist: Jenkins Rouge, MD  ? ?Subjective  ? ?Asymptomatic although did have decreased blood pressure after walking with rehab. ? ?Inpatient Medications  ?  ?Scheduled Meds: ? amLODipine  5 mg Oral Daily  ? aspirin  81 mg Oral Daily  ? atorvastatin  40 mg Oral Daily  ? dapagliflozin propanediol  10 mg Oral Daily  ? ezetimibe  10 mg Oral Daily  ? insulin aspart  0-5 Units Subcutaneous QHS  ? insulin aspart  0-9 Units Subcutaneous TID WC  ? insulin glargine-yfgn  25 Units Subcutaneous QHS  ? metoprolol succinate  12.5 mg Oral Daily  ? potassium chloride  30 mEq Oral BID  ? sodium chloride flush  3 mL Intravenous Q12H  ? sodium chloride flush  3 mL Intravenous Q12H  ? sodium chloride flush  3 mL Intravenous Q12H  ? ticagrelor  90 mg Oral BID  ? ?Continuous Infusions: ? sodium chloride    ? sodium chloride    ? nitroGLYCERIN    ? ?PRN Meds: ?sodium chloride, sodium chloride, acetaminophen **OR** acetaminophen, HYDROcodone-acetaminophen, morphine injection, ondansetron (ZOFRAN) IV, sodium chloride flush, sodium chloride flush  ? ?Vital Signs  ?  ?Vitals:  ? 10/22/21 1838 10/22/21 1909 10/22/21 2000 10/23/21 0526  ?BP: (!) 139/124 128/65 (!) 113/98 140/67  ?Pulse: (!) 57 (!) 59  60  ?Resp: '20 20 16 19  '$ ?Temp:   (!) 97.5 ?F (36.4 ?C) 98.1 ?F (36.7 ?C)  ?TempSrc:   Axillary Oral  ?SpO2: 98% 95% 98% 100%  ?Weight:      ?Height:      ? ? ?Intake/Output Summary (Last 24 hours) at 10/23/2021 0810 ?Last data filed at 10/23/2021 0015 ?Gross per 24 hour  ?Intake 1101.54 ml  ?Output 600 ml  ?Net 501.54 ml  ? ? ?  10/21/2021  ?  3:40 PM 08/21/2021  ?  8:24 AM 07/29/2021  ? 11:30 AM  ?Last 3 Weights  ?Weight (lbs) 190 lb 193 lb 194 lb 3.2 oz  ?Weight (kg) 86.183 kg 87.544 kg 88.089 kg  ?   ? ?Telemetry  ?  ?NSR - Personally Reviewed ? ?ECG  ?  ?NSR / SB with LBBB and LAD. - Personally Reviewed ? ?Physical Exam  ?Overweight ?GEN: No  acute distress.   ?Neck: No JVD ?Cardiac: RRR, no murmurs, rubs, or gallops.  ?Respiratory: Clear to auscultation bilaterally. ?GI: Soft, nontender, non-distended  ?MS: No edema; No deformity. ?Neuro:  Nonfocal  ?Psych: Normal affect  ? ?Labs  ?  ?High Sensitivity Troponin:   ?Recent Labs  ?Lab 10/21/21 ?1601 10/21/21 ?1746 10/21/21 ?2345 10/22/21 ?0100  ?TROPONINIHS 233* 242* 257* 265*  ?   ?Chemistry ?Recent Labs  ?Lab 10/21/21 ?1601 10/22/21 ?0100 10/23/21 ?0209  ?NA 132* 139 135  ?K 3.8 3.7 3.8  ?CL 99 105 105  ?CO2 '24 27 23  '$ ?GLUCOSE 413* 270* 163*  ?BUN '22 15 18  '$ ?CREATININE 0.94 0.82 0.92  ?CALCIUM 9.2 9.3 8.8*  ?MG  --  1.6*  --   ?PROT 7.2 6.5  --   ?ALBUMIN 4.0 3.6  --   ?AST 19 17  --   ?ALT 17 18  --   ?ALKPHOS 76 54  --   ?BILITOT 0.6 0.6  --   ?GFRNONAA >60 >60 >60  ?ANIONGAP '9 7 7  '$ ?  ?Lipids  ?Recent Labs  ?Lab 10/22/21 ?0100  ?CHOL 125  ?  TRIG 74  ?HDL 44  ?[place] 66  ?CHOLHDL 2.8  ?  ?Hematology ?Recent Labs  ?Lab 10/21/21 ?1601 10/22/21 ?0100  ?WBC 6.5 5.3  ?RBC 4.52 4.26  ?HGB 13.3 12.6*  ?HCT 37.9* 35.5*  ?MCV 83.8 83.3  ?MCH 29.4 29.6  ?MCHC 35.1 35.5  ?RDW 12.5 12.5  ?PLT 187 160  ? ?Thyroid  ?Recent Labs  ?Lab 10/22/21 ?2130  ?TSH 4.528*  ?  ?BNP ?Recent Labs  ?Lab 10/22/21 ?0100  ?BNP 108.2*  ?  ?DDimer No results for input(s): DDIMER in the last 168 hours.  ? ?Radiology  ?  ?CT ANGIO HEAD NECK W WO CM ? ?Result Date: 10/21/2021 ?CLINICAL DATA:  Dizziness, persistent or recurrent, left chest pain EXAM: CT ANGIOGRAPHY HEAD AND NECK TECHNIQUE: Multidetector CT imaging of the head and neck was performed using the standard protocol during bolus administration of intravenous contrast. Multiplanar CT image reconstructions and MIPs were obtained to evaluate the vascular anatomy. Carotid stenosis measurements (when applicable) are obtained utilizing NASCET criteria, using the distal internal carotid diameter as the denominator. RADIATION DOSE REDUCTION: This exam was performed according to the  departmental dose-optimization program which includes automated exposure control, adjustment of the mA and/or kV according to patient size and/or use of iterative reconstruction technique. CONTRAST:  17m OMNIPAQUE IOHEXOL 350 MG/ML SOLN COMPARISON:  No prior CTA, correlation is made with CT head 03/27/2020. FINDINGS: CT HEAD FINDINGS Brain: No evidence of acute infarction, hemorrhage, cerebral edema, mass, mass effect, or midline shift. No hydrocephalus or extra-axial fluid collection. Vascular: No hyperdense vessel. Skull: Normal. Negative for fracture or focal lesion. Sinuses/Orbits: No acute finding. Other: The mastoid air cells are well aerated. CTA NECK FINDINGS Aortic arch: Standard branching. Imaged portion shows no evidence of aneurysm or dissection. No significant stenosis of the major arch vessel origins. Aortic atherosclerosis. Right carotid system: No evidence of dissection, stenosis (50% or greater) or occlusion. Plaque in the common carotid, at the bifurcation, and in the proximal right ICA is not hemodynamically significant. Left carotid system: No evidence of dissection, stenosis (50% or greater) or occlusion. Plaque in the common carotid, at the bifurcation, and in the proximal left ICA is not hemodynamically significant. Vertebral arteries: Left dominant. At the origin of the right vertebral artery, there is significant calcification, which extends into the proximal V1 segment, which appears to be moderately stenosed. Otherwise no significant stenosis in the vertebral arteries. No evidence of dissection or occlusion. Skeleton: No acute osseous abnormality. Other neck: Negative. Upper chest: Emphysema. No focal pulmonary opacity or pleural effusion. Review of the MIP images confirms the above findings CTA HEAD FINDINGS Anterior circulation: Both internal carotid arteries are patent to the termini, without significant stenosis. Mild calcifications in the supraclinoid ICAs are not hemodynamically  significant. A1 segments patent with a hypoplastic right A1. Normal anterior communicating artery. Anterior cerebral arteries are patent to their distal aspects. No M1 stenosis or occlusion. Normal MCA bifurcations. Distal MCA branches perfused and symmetric. Posterior circulation: Vertebral arteries patent to the vertebrobasilar junction without stenosis. Posterior inferior cerebral arteries patent bilaterally. Basilar patent to its distal aspect. Superior cerebellar arteries patent bilaterally. Patent P1 segments. PCAs perfused to their distal aspects without stenosis. The bilateral posterior communicating arteries are not visualized. Venous sinuses: As permitted by contrast timing, patent. Anatomic variants: None significant. Review of the MIP images confirms the above findings IMPRESSION: 1. Moderate narrowing of the origin of the right vertebral artery. Otherwise no hemodynamically significant stenosis in the neck. 2.  No intracranial large vessel occlusion or significant stenosis. 3.  No acute intracranial process. 4. Aortic Atherosclerosis (ICD10-I70.0) and Emphysema (ICD10-J43.9). Electronically Signed   By: Merilyn Baba M.D.   On: 10/21/2021 17:46  ? ?DG Chest 2 View ? ?Result Date: 10/21/2021 ?CLINICAL DATA:  71 y./o male. Pt c/o LT CP for some time; reports unsteady gait and lightheadedness for several days; c/o HA and HTNCP EXAM: CHEST - 2 VIEW COMPARISON:  None. FINDINGS: Normal mediastinum and cardiac silhouette. Normal pulmonary vasculature. No evidence of effusion, infiltrate, or pneumothorax. No acute bony abnormality. Degenerative osteophytosis of the spine. IMPRESSION: No acute cardiopulmonary process. Electronically Signed   By: Suzy Bouchard M.D.   On: 10/21/2021 16:20  ? ?CARDIAC CATHETERIZATION ? ?Result Date: 10/22/2021 ?  Dist LM lesion is 20% stenosed.   Mid LAD lesion is 40% stenosed.   Prox RCA lesion is 20% stenosed.   Dist RCA lesion is 40% stenosed.   Ost Cx lesion is 90% stenosed.    A stent was successfully placed.   Post intervention, there is a 0% residual stenosis. 1.  High-grade ostial left circumflex lesion treated with 1 drug-eluting stent with mild to moderate diffuse disease elsewher

## 2021-10-23 NOTE — Progress Notes (Signed)
CARDIAC REHAB PHASE I  ? ?PRE:  Rate/Rhythm: 56 SB ? ?BP:  Sitting: 157/81     ? ?SaO2: 99 RA ? ?MODE:  Ambulation: 370 ft  ? ?POST:  Rate/Rhythm: 88 SR ? ?BP:  Sitting: 94/79 --> 157/70 ? ?  SaO2: 98 RA ? ? ?Pt eager to ambulate. Pt transitioned slowly to standing position and ambulated 318f in hallway assist of one with walker. Pt c/o leg stiffness, but denied CP, SOB, or dizziness. Pt returned to recliner. BP noted to be lower, pt continued to deny symptoms. RN and MD aware. Pt educated on importance of ASA and Brilinta. Pt given stent card along with heart healthy and diabetic diets. Reviewed site care, restrictions, and exercise guidelines. Will refer to CRP II [place]. ? ?[phone]?TRufina Falco RN BSN ?10/23/2021 ?9:50 AM ? ?

## 2021-10-23 NOTE — Discharge Summary (Addendum)
?Discharge Summary ? ?Scott Donaldson HUT:[id number] DOB: 1950/11/25 ? ?PCP: [name], MD ? ?Admit date: 10/21/2021 ?Discharge date: 10/23/2021 ? ?Time spent: 35 minutes. ? ?Recommendations for Outpatient Follow-up:  ?Follow-up with cardiology in 1 to 2 weeks. ?Follow-up with your primary care provider within a week regarding your diabetes. ?Take your medications as prescribed. ? ?Discharge Diagnoses:  ?Active Hospital Problems  ? Diagnosis Date Noted  ? NSTEMI (non-ST elevated myocardial infarction) ([place]) 10/21/2021  ?  Priority: High  ? Light-headedness 10/22/2021  ?  Priority: Medium   ? HYPERCHOLESTEROLEMIA 07/29/2008  ?  Priority: Medium   ? Type 2 diabetes mellitus with hyperglycemia, with long-term current use of insulin ([place]) 03/04/2007  ?  Priority: Medium   ? Essential hypertension 03/04/2007  ?  Priority: Medium   ? Chronic diastolic CHF (congestive heart failure) ([place]) 10/22/2021  ?  ?Resolved Hospital Problems  ? Diagnosis Date Noted Date Resolved  ? Benign prostatic hyperplasia with urinary hesitancy 06/14/2018 10/22/2021  ? ? ?Discharge Condition: Stable. ? ?Diet recommendation: Heart healthy carb modified diet. ? ? ? ?Vitals:  ? 10/23/21 0526 10/23/21 0959  ?BP: 140/67 (!) 157/70  ?Pulse: 60 71  ?Resp: 19   ?Temp: 98.1 ?F (36.7 ?C)   ?SpO2: 100%   ? ? ?History of present illness:  ?Scott Donaldson is a 71 y.o. male with PMH significant for DM2, HTN, HLD, nonobstructive CAD, LBBB, anemia, GERD, BPH s/p TURP. ?On 3/29, patient woke up in the morning around 6 AM with chest pain, radiation to his arm.  He also started having worsening of his chronic exertional dyspnea and went to [place] ED. work-up in the ED revealed elevated troponin with concern for NSTEMI.  Patient was started on heparin drip and was transferred to [facility] for further evaluation.  Seen by cardiology, status post left heart cath, PCI with stent placement.  Recommendation for DAPT aspirin,  Brilinta x at least 1 year followed by aspirin and Plavix, then Plavix monotherapy.  Recommendation to increase Lipitor dose from 40 mg daily to 80 mg daily.  Toprol-XL 12.5 mg daily added, Farxiga 10 mg daily also added. ?  ?10/23/2021: Patient was seen and examined at his bedside.  There were no acute events overnight.  He has no new complaints.  Seen by cardiology, okay to discharge from a cardiac standpoint.  Patient is eager to go home. ? ?Hospital Course:  ?Principal Problem: ?  NSTEMI (non-ST elevated myocardial infarction) ([place]) ?Active Problems: ?  Type 2 diabetes mellitus with hyperglycemia, with long-term current use of insulin ([place]) ?  HYPERCHOLESTEROLEMIA ?  Essential hypertension ?  Light-headedness ?  Chronic diastolic CHF (congestive heart failure) ([place]) ? 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 ?Post heart cath, ostial CFX stent with good result and residual moderate LM and ostial LAD stenosis. ?Aspirin and Brilinta for at least 1 year then transition to asa and Plavix, then Plavix monotherapy (eventually). ?Cath site unremarkable. ?Plan discharge if ambulates without angina, labs okay, and pm intensive preventive therapy: Increase Atorva to 80 mg daily; add low dose beta blocker- Metoprolol 12.5 mg daily; Add Sglt-2. ?Cardiology F/U 1-2 weeks [name]) ?Follow-up with cardiology in 1 to 2 weeks. ?PT evaluation on 10/23/2021, no further PT recommended. ? ?Uncontrolled type 2 diabetes mellitus with hyperglycemia ?-A1c 10.4  on 10/22/21 ?-Home meds include Lantus 25 units daily, Humulin 70/30 36 units twice daily, metformin 1000 mg nightly. ?Home Humulin 70/30 discontinued. ?Farxiga 10 mg daily added by cardiology. ?Follow-up with your primary care provider within a  week. ? ?Chronic diastolic CHF ?Essential hypertension ?-Home meds include Toprol 50 mg daily at bedtime, amlodipine 5 mg daily, Benicar 40 mg daily ?Currently on Toprol-XL 12.5 mg daily, amlodipine 5 mg daily ?-TTE done on 10/22/2021 with EF 55 to 60%, GIDD ? ?Hypomagnesemia ?Mag 1.6 ?Mag oxide 400 mg daily x 5 days ?  ?Goals of care ?  Code Status: Full Code  ?  ?  ?Mobility: Encourage ambulation ?  ?Nutritional status:  ?Body mass index is 25.77 kg/m?.  ?  ? ?Procedures: ?Left heart cath on 10/22/2021, Dr. Tamala Julian. ? ?Consultations: ?Cardiology ? ?Discharge Exam: ?BP (!) 157/70   Pulse 71   Temp 98.1 ?F (36.7 ?C) (Oral)   Resp 19   Ht 6' (1.829 m)   Wt 86.2 kg   SpO2 100%   BMI 25.77 kg/m?  ?General: 71 y.o. year-old male well developed well nourished in no acute distress.  Alert and oriented x3. ?Cardiovascular: Regular rate and rhythm with no rubs or gallops.  No thyromegaly or JVD noted.   ?Respiratory: Clear to auscultation with no wheezes or rales. Good inspiratory effort. ?Abdomen: Soft nontender nondistended with normal bowel sounds x4 quadrants. ?Musculoskeletal: No lower extremity edema. 2/4 pulses in all 4 extremities. ?Skin: No ulcerative lesions noted or rashes, ?Psychiatry: Mood is appropriate for condition and setting ? ?Discharge Instructions ?You were cared for by a hospitalist during your hospital stay. If you have any questions about your discharge medications or the care you received while you were in the hospital after you are discharged, you can call the unit and asked to speak with the hospitalist on call if the hospitalist that took care of you is not available. Once you are discharged, your primary care physician will handle any further medical issues. Please note that NO REFILLS for any discharge medications will be authorized once you are discharged, as it is imperative that you return to your primary care physician (or establish a relationship with a primary care physician if you do  not have one) for your aftercare needs so that they can reassess your need for medications and monitor your lab values. ? ?Discharge Instructions   ? ? AMB Referral to Cardiac Rehabilitation - Phase II   Complete by: As directed ?  ? Diagnosis: NSTEMI  ? After initial evaluation and assessments completed: Virtual Based Care may be provided alone or in conjunction with Phase 2 Cardiac Rehab based on patient barriers.: Yes  ? ?  ? ?Allergies as of 10/23/2021   ?No Known Allergies ?  ? ?  ?Medication List  ?  ? ?STOP taking these medications   ? ?HumuLIN 70/30 (70-30) 100 UNIT/ML injection ?Generic drug: insulin NPH-regular Human ?  ?meloxicam 15 MG tablet ?Commonly known as: MOBIC ?  ?olmesartan 40 MG tablet ?Commonly known as: BENICAR ?  ? ?  ? ?TAKE these medications   ? ?amLODipine 5 MG tablet ?Commonly known as: NORVASC ?TAKE 1 TABLET(5 MG) BY MOUTH DAILY ?What changed: See the new instructions. ?  ?aspirin EC 81 MG tablet ?Take 81 mg by mouth daily. Swallow whole. ?  ?atorvastatin 80 MG tablet ?Commonly known as: LIPITOR ?Take 1 tablet (80 mg total) by mouth daily. ?Start taking on: October 24, 2021 ?What changed:  ?medication  strength ?See the new instructions. ?  ?dapagliflozin propanediol 10 MG Tabs tablet ?Commonly known as: FARXIGA ?Take 1 tablet (10 mg total) by mouth daily. ?Start taking on: October 24, 2021 ?  ?ezetimibe 10 MG tablet ?Commonly known as: ZETIA ?Take 1 tablet (10 mg total) by mouth daily. ?  ?glucose blood test strip ?1 each by Other route 2 (two) times daily. And lancets 2/day ?  ?insulin starter kit- pen needles Misc ?1 kit by Other route once for 1 dose. ?  ?Iron (Ferrous Sulfate) 325 (65 Fe) MG Tabs ?Take one daily ?What changed:  ?how much to take ?how to take this ?when to take this ?  ?Lantus SoloStar 100 UNIT/ML Solostar Pen ?Generic drug: insulin glargine ?Inject 25 Units into the skin at bedtime. And pen needles 1/day ?What changed:  ?how much to take ?when to take this ?  ?Magnesium  Oxide 400 MG Caps ?Take 1 capsule (400 mg total) by mouth daily for 5 days. ?  ?metFORMIN 1000 MG (MOD) 24 hr tablet ?Commonly known as: [place] ?Take 1 tablet (1,000 mg total) by mouth daily. ?What changed:  ?medication stren

## 2021-10-23 NOTE — TOC Benefit Eligibility Note (Signed)
Patient Advocate Encounter ? ?Insurance verification completed.   ? ?The patient is currently admitted and upon discharge could be taking Lantus Solostar [place]. ? ?The current 30 day co-pay is, $35.00.  ? ?The patient is insured through [employer] Part D  ? ? ? ?Lyndel Safe, CPhT ?Pharmacy Patient Advocate Specialist ?[facility] Patient Advocate Team ?Direct Number: [phone]  Fax: [phone] ? ? ? ? ? ?  ?

## 2021-10-27 MED ORDER — DAPAGLIFLOZIN PROPANEDIOL 10 MG PO TABS: 10.0000 mg | ORAL_TABLET | Freq: Every day | ORAL | 0 refills | Status: AC

## 2021-10-27 NOTE — Telephone Encounter (Signed)
I do not yet see that TOC call has been placed. ?Note was routed below from [facility] to [facility] pool but ? Whether this pool is functional. Will route back to [facility] team. ?

## 2021-10-27 NOTE — Telephone Encounter (Signed)
Per pharmacy team while admitted the cost for 30 day supply of Farxiga '10mg'$  QD. Hospitalist sent 90 day supply which is likely why cost is higher. Okay to send 30-day supply to patient's preferred pharmacy. We can discuss medication and patient assistance at upcoming follow up. ? ?Thanks! ?Loel Dubonnet, NP  ?

## 2021-10-27 NOTE — Telephone Encounter (Signed)
-  Daughter updated and verbalized understanding. ?-30 day supply sent to pharmacy as recommended.  ?

## 2021-10-27 NOTE — Telephone Encounter (Signed)
Patient contacted regarding discharge from [facility] on 10/23/21. ? ?Patient understands to follow up with provider Laurann Montana, NP on 11/03/21 at 10:05 am  at [place]. ?Patient understands discharge instructions? yes ?Patient understands medications and regiment? yes ?Patient understands to bring all medications to this visit? yes ? ?-Daughter report they are unable to afford farxiga as the cost is over $200. ?-Will forward to NP to make aware.  ? ? ? ? ?

## 2021-10-28 ENCOUNTER — Telehealth (HOSPITAL_COMMUNITY): Payer: Self-pay

## 2021-10-28 NOTE — Telephone Encounter (Signed)
Pt insurance is active and benefits verified through [facility] Medicare Co-pay 0, DED 0/0 met, out of pocket $3,600/$50 met, co-insurance 0%. no pre-authorization required. Passport, 10/28/2021_0 :51am, REF# [id number] ?  ?Will contact patient to see if he is interested in the Cardiac Rehab Program. If interested, patient will need to complete follow up appt. Once completed, patient will be contacted for scheduling upon review by the RN Navigator. ?

## 2021-10-28 NOTE — Telephone Encounter (Signed)
Called and spoke with pt's daughter and she stated that she will talk to her dad to see if he is interested in the cardiac rehab program. I advised pt daughter that we will call back at a later time to possibly get him scheduled. ?

## 2021-11-02 ENCOUNTER — Encounter: Payer: Self-pay | Admitting: *Deleted

## 2021-11-02 ENCOUNTER — Encounter: Payer: Medicare Other | Admitting: *Deleted

## 2021-11-02 DIAGNOSIS — Z006 Encounter for examination for normal comparison and control in clinical research program: Secondary | ICD-10-CM

## 2021-11-02 MED ORDER — STUDY - SOS-AMI - SELATOGREL 16 MG/0.5 ML OR PLACEBO SQ INJECTION (PI-CHRISTOPHER): 16.0000 mg | INJECTION | Freq: Once | SUBCUTANEOUS | 0 refills | Status: DC

## 2021-11-02 MED ORDER — STUDY - SOS-AMI - PLACEBO FOR SELATOGREL 0 MG/0.5 ML SQ INJECTION FOR SCREENING USE ONLY (PI-CHRISTOPHER)
0.5000 mL | INJECTION | Freq: Once | SUBCUTANEOUS | Status: DC
  Filled 2021-11-02: qty 0.5

## 2021-11-02 NOTE — Research (Signed)
[id number], SOS-AMI ? ?Subject ID: [id number] ?Trainer's name: Shirley Muscat, RN ?Trainer's signature:  on file- Delegation of Authority Log ?Date of visit:    02-Nov-2021 ?Enrollment: ?  1.2 Sex Male '[]'$     Male   '[x]'$  ? 1.3    Age at screening  _70_________ years ? ?Was Subject randomized:   '[x]'$  Yes  '[]'$  No ? ?      If No, select reason:   '[]'$  Not eligible as per inclusion/exclusion criteria ?    '[]'$  Withdrawal by subject ?    '[]'$   Withdrawal by parent / guardian ?    '[]'$  Adverse event  ?    '[]'$  Lost to follow-up ?    '[]'$  Death ?    '[]'$  Other: ___________________   ? ?2.  Randomization:  ? 2.1 Randomization Date 02-Nov-2021 ? 2.2 Randomization Number  [id number]____ ? 2.3 Stratification 1  ?   '[]'$   Background oral P2Y12 receptor antagonist: None ?   '[]'$   Background oral P2Y12 receptor antagonist: Clopidogrel ?   '[]'$   Background oral P2Y12 receptor antagonist: Prasugrel ?   '[x]'$   Background oral P2Y12 receptor antagonist: Ticagrelor ? ? ?Form based on IDORSIA [id number],IONGEXBM  Version 4.0  page 56 - 14 October 2019 ?And SOS-AMI_CRFversion 5.0_27Oct2021 pages 1 and 17.    ? ? ?SCREENING: PATIENT TRAINING- DEMONSTRATION DEVICE  ? ?Was the training delivered?   '[]'$  NO  '[x]'$  YES ?       1.1 Date of training:  _10-APR-2023   dd/mmm/yyyy ?    1.2 Start time: 13:10_  24 hour clock ? 1.3 Stop time: _1358   24 hour clock ? ?   ?Difficulties in using the autoinjector for the DEMONSTRATION DEVICE self-injection: ? ?3. Were there any difficulties in performing the placebo self-injection? '[x]'$  NO  '[]'$  YES ? ? IF NO, DO NOT ANSWER THE FOLLOWING QUESTIONS ? ?Difficulties with Step1: Choose an injection site? ? ?3.1.1. Was the injection site as defined in the protocol (abdomen/thigh)? '[]'$  NO  '[]'$   YES ?3.1.2 Was the injection done on bare skin? '[]'$  NO  '[]'$   YES ?3.1.3 Did the subject report any other difficulties in choosing injection site? '[]'$  NO  '[]'$  YES ?                If YES, Please specify: ______________________ ? ?Difficulties with Step 2:  Twist cap off? ? ?3.2.1  Did the subject twist the cap to remove it? '[]'$  NO  '[]'$   YES ?3.2.2    Did the subject twist the cap counterclockwise? '[]'$   NO  '[]'$   YES ?3.2.3   Did the force/ torque applied sufficient to twist the cap off?  '[]'$   NO  '[]'$   YES ?3.2.4 Did the subject report any other difficulties in twisting the cap off? '[]'$   NO  '[]'$  YES ?               If YES, please specify:___________________ ? ?Difficulties with Step 3: Pinch skin and place the autoinjector: ? ?3.3.1  Did the subject pinch skin at injection site? '[]'$   NO  '[]'$   YES ?3.3.2 Did the subject place the autoinjector perpendicular to the skin? '[]'$   NO  '[]'$   YES ?3.3.3 Did the subject report any other difficulties pinching the skin and placing  ?                the autoinejctor?  '[]'$   NO  '[]'$   YES ? ?Difficulties with Step 4: Firmly  puch down and hold for 3 seconds ? ?3.4.1 Did the subject attempt to inject with the autoinjector in the right position,  ?    i.e needle end down?  '[]'$   NO  '[]'$   YES ?3.4.2 Did the subject push down firmly until it clicked? '[]'$  NO  '[]'$   YES ?3.4.3 Did the subject hold the autoinjector for about 3 seconds or until the  ?      Viewing window turned orange?  '[]'$   NO  '[]'$  YES ?3.4.4 Did the subject report any other difficulties pushing firmly down? '[]'$  NO  '[]'$   YES ?    If YES, please specify: _________________ ? ?[employer]   SOS-AMI_CRF_Version 5.0_27OCT2021  pgs 6,7  ? ? ?Patient Training & Self-injection of [place]  ?1. Was the training delivered?  '[]'$   No  '[x]'$   Yes ?     1.1  Date of training   02-Nov-2021 ?     1.2  Start time       _13:38_ (24 hour clock) ?     1.3  Stop time       __13:58 (24 hour clock) ?      ?2.  Did the placebo self-injection occur?     '[]'$   No  '[x]'$   Yes ?     2.1 Autoinjector ID / Label    [id number] ?    ?Difficulties in using the autoinjector for the placebo self-injection    ?3.  Were there any difficulties in performing the placebo self-injection '[x]'$   No  '[]'$   Yes ? ?     IF NO, DO NOT ANSWER THE FOLLOWING  QUESTIONS ? ?Difficulties with Step 1: Choose an injection site ?    3.1.1  Was the injection site as defined in the protocol (abdomen / thigh)?  '[]'$   No  '[]'$   Yes ?    3.1.2. Was the injection done on bare skin?  '[]'$   No  '[]'$   Yes ?    3.1.3  Did the subject report any other difficulties choosing  ?                    injection site ?  '[]'$   No  '[]'$   Yes, please specify _____________________ ? ?Difficulties with Step 2: Twist cap off  ?   3.2.1  Did the subject twist the cap to remove it?  '[]'$   No  '[]'$   Yes ?   3.2.2  Did the subject twist the cap counterclockwise? '[]'$   No  '[]'$   Yes ?   3.2.3  Did the force/ torque applied sufficient to twist the cap off?   '[]'$   No  '[]'$   Yes ?   3.2.4  Did the subject report any other difficulties in twisting  ?              the cap off?  '[]'$   No  '[]'$   Yes, please specify ________________________________ ? ?Difficulties with Step 3: Pinch skin and place the autoinjector ?   3.3.1  Did the subject pinch skin at injection site?  '[]'$   No  '[]'$   Yes ?   3.3.2  Did the subject place the autoinjector perpendicularly to the skin? '[]'$   No  '[]'$   Yes ?   3.3.3  Did the subject report any other difficulties pinching the skin ?               and placing the autoinjector?    '[]'$   No  '[]'$   Yes, please specify ____________________ ? ?Difficulties with Step 4: Firmly push down and hold for 3 seconds ?   3.4.1  Did the subject attempt to inject with the autoinjector in the right ?               position, I.e. needle end down?   '[]'$  No  '[]'$   Yes ?   3.4.2  Did the subject push down firmly until it clicked? '[]'$   No  '[]'$   Yes ?   3.4.3  Did the subject hold the autoinjector for about 3 seconds ?                or until the viewing window turned orange?  '[]'$   No  '[]'$   Yes ?   3.4.4  Did the subject report any other difficulties pushing ?                firmly down ?  '[]'$   No  '[]'$   Yes, Please specify __________________________ ? ? ? ?INITIAL PATIENT TRAINING ? ?Q1, Did someone (e.g. caregiver, family member) attend the training  session  ?           together with the patient?    '[]'$  No  '[x]'$   Yes ? ?      If yes, please note name phone number and address of other person: ?_____Jaclyn Donaldson  (DAUGHTER)  lives with patient________ ? ?Q2. Was the following information provided to the subject? ?            '[x]'$   Heart attack symptoms ?  '[x]'$   How to act (inject and follow-up actions to be taken) ? '[x]'$   Use of the demo device, including label instructions and IFU ? '[x]'$   How to perform the placebo self-injection  ? ?Q3  Did the subject correctly reply to the wrap-up questions?  ? A.  What are common heart attack symptoms?    '[]'$   No  '[x]'$   Yes ?            B.  What has to be done in case any of those symptoms occurs?  '[]'$  No  '[x]'$  Yes ? ?     Make note of any misconceptions and clarifications given: ?______none_______________________________ ? ?Q4  Where will the subject keep/store the study autoinjectors? _____patient aware of storage parameters____________ ?     ?Form based on [employer] SOS-AMI_CRF Version 5.0 - 27OCT2021 PGS 6, 7, 51 ? ? ?FOLLOWING ALL TRAINING, THE PATIENT WAS GIVEN KITS [phone] AND [phone]. ALL QUESTIONS WERE ASKED AND ANSWERED TO THE SATISFACTION OF BOTH THE PATIENT AND HIS DAUGHTER, Scott Donaldson  ? ? ? ?Current Outpatient Medications:  ?  amLODipine (NORVASC) 5 MG tablet, TAKE 1 TABLET(5 MG) BY MOUTH DAILY (Patient taking differently: Take 5 mg by mouth daily.), Disp: 90 tablet, Rfl: 0 ?  aspirin EC 81 MG tablet, Take 81 mg by mouth daily. Swallow whole., Disp: , Rfl:  ?  atorvastatin (LIPITOR) 80 MG tablet, Take 1 tablet (80 mg total) by mouth daily., Disp: 90 tablet, Rfl: 0 ?  dapagliflozin propanediol (FARXIGA) 10 MG TABS tablet, Take 1 tablet (10 mg total) by mouth daily., Disp: 30 tablet, Rfl: 0 ?  ezetimibe (ZETIA) 10 MG tablet, Take 1 tablet (10 mg total) by mouth daily., Disp: 90 tablet, Rfl: 0 ?  glucose blood test strip, 1 each by Other route 2 (two) times daily. And lancets 2/day, Disp: 200 each, Rfl: 3 ?  insulin  glargine (LANTUS SOLOSTAR) 100 UNIT/ML Solostar Pen, Inject 25 Units into the skin at bedtime. And pen needles 1/day, Disp: 30 mL, Rfl: 0 ?  Iron, Ferrous Sulfate, 325 (65 Fe) MG TABS, Take one daily (Patie

## 2021-11-02 NOTE — Research (Signed)
ID- [id number],  SOS-AMI ? ?SUBJECT INFORMED CONSENT PROCESS ? ? ?Subject Consented by:  ? ?'[]'$   Principal Investigator:  Buford Dresser, MD ?'[]'$   Sub-Investigator: Bing Quarry, MD ?'[]'$   Research Nurse: Philemon Kingdom, RN  ?'[x]'$   Research Nurse: Berneda Rose, RN  ? ?Consenting Process:  ? ?'[x]'$   The above named investigator/' Research Nurse explained the study and SIL-ICF,  ?         in their entirety,  to the Subject and Any / All third parties deemed necessary by the subject. ? ?'[x]'$   All medical questions, pertaining to the Study, were asked and fully answered.   ? ?'[x]'$   Subject voiced understanding of the Informed Consent contents and is  ?      agreeable to proceed with the SOS-AMI study.  ? ?Both the Subject and Investigator/ Research Nurse signed the SIL-ICF on:   ? ?Date of Consent:  [id number]   (DD-[id number]) ?Time of Consent: 1:10pm  (24 hour clock) ? ?'[x]'$  No study related assessment or testing has been performed prior to the  ?      Subject's ICF signature.  ? ?'[x]'$  A signed copy of the SIL-ICF was given to the subject/ legal representative,  ?       as applicable.  ? ?Radio producer / Research Nurse Comments:  ? ?Mr Sciortino's physical assessment with no abnormalities. Killip class I.  ? ? ? ? ? ? ?**Form based on IDORSIA ID-[id number]_CRF Version5.0-27OCT2021  page 3  ?

## 2021-11-02 NOTE — Research (Signed)
SOS-AMI  Informed Consent  ? ?Subject Name: Scott Donaldson ? ?Subject met inclusion and exclusion criteria.  The informed consent form, study requirements and expectations were reviewed with the subject and questions and concerns were addressed prior to the signing of the consent form.  The subject verbalized understanding of the trial requirements.  The subject agreed to participate in the SOS-AMI trial and signed the informed consent at 13:10 on 02-Nov-2021.  The informed consent was obtained prior to performance of any protocol-specific procedures for the subject.  A copy of the signed informed consent was given to the subject and a copy was placed in the subject's medical record.  ? ?Leota Jacobsen, BSN, CVRN-BC  ? ?Clinical Cardiovascular Nurse ?[facility]  ?[place] ? [phone]  ?  ? ? ?

## 2021-11-02 NOTE — Research (Addendum)
[id number], SOS-AMI         ? ?Subject ID: [id number] ?Trainer's name: Shirley Muscat, RN ?Trainer's signature:  on file- Delegation of Authority Log ?Date of visit:        02-Nov-2021 ? ?SOS-AMI ELIGIBILITY:  INCLUSION / EXCLUSION CRITERIA ? ?Inclusion Criteria:  ?1. Signed and dated informed consent  '[]'$   No   '[x]'$   Yes ? ?33. >/= 72 years old (or age of majority in local region). '[]'$   No  '[x]'$   Yes ? ?3.  Discharged with a confirmed diagnosis of symptomatic ?        Type 1 AMI within 4 weeks prior to randomization.  '[]'$   No  '[x]'$   Yes ? ?4. Presence of either a second prior AMI within 1 year of screening  '[x]'$  No '[]'$  Yes ? ?       Or at least 2 of the following:  ? ?    A.  Second  prior AMI more than 1 year before screening  '[x]'$   No  '[]'$   Yes ?    B.  Diabetes Mellitus '[]'$   No  '[x]'$   Yes ?    C.  Chronic Kidney Disease    '[x]'$   No  '[]'$   Yes ?    D.  Multivessel Coronary Artery Disease   '[x]'$   No  '[]'$   Yes ?    E.  Peripheral Artery Disease  '[x]'$   No  '[]'$   Yes ?    F.  Age >/= 65 years  '[]'$   No  '[x]'$   yes ?    G. Absence of coronary revascularization of the qualifying AMI.  '[x]'$   No  '[]'$  Yes ?    H. Active daily smoking at screening '[x]'$   No  '[]'$   Yes ? ?5.  Subject having successfully self-administered placebo during screening. '[]'$   No  '[x]'$   Yes ? ?6.  Women of childbearing potential who fulfill the following criteria: N/A ?     - Negative pregnancy test (Urine or Serum) at randomization    '[]'$   No  '[]'$   Yes ?     - Agreement to use an acceptable contraceptive method. '[]'$   No  '[]'$   Yes ? ?Exclusion Criteria:  ?1.  Increased risk of serious bleeding including any of the following:  ? ?    A. History of intracranial bleed.  '[x]'$   No  '[]'$   Yes ?    B. Known uncorrected intracranial vascular abnormality '[x]'$   No  '[]'$   Yes ?    C. Gastrointestinal bleed requiring hospitalization or transfusion ?           Within 1 year prior to screening  '[x]'$   No  '[]'$   Yes ?    D. Subjects on oral triple antithrombotic therapy '[x]'$   No  '[]'$   Yes ?    E.  Known liver impairment significantly affecting hepatic function '[x]'$   No  '[]'$   Yes ?    F. Current dialysis  '[x]'$   No  '[]'$   Yes ?    G. Ischemic stroke or transient ischemic attack within 3 months of screening '[x]'$  No  '[]'$  Yes ? ?2. Chronic anemia with hemoglobin <10 g/dL '[x]'$   No  '[]'$   Yes ? ?3. Chronic thrombocytopenia with platelet count <100,000 /mm3.      '[x]'$   No  '[]'$   Yes ? ?4. Concomitant diseases or conditions that in the opinion of the investigator are  ?  not compatible with study participation.   '[x]'$   No  '[]'$  Yes  ? ?5. Known hypersensitivity to selatogrel,m any of its excipients, or drugs  ?         of the P2Y12 class.  '[x]'$   No  '[]'$   Yes ? ?6. Previous exposure to an investigational drug within 3 months  ?          prior to randomization     '[x]'$   No  '[]'$   Yes  ? ?7. Participation in another clinical trial with an investigational product ?    or device within 3 months prior to randomization    '[x]'$   No   '[]'$   Yes ? ?8.  Pregnant, planning to become pregnant, or lactating women  '[x]'$   No  '[]'$   Yes ? ?9.  Known concomitant life-threatening disease with a               ?        life expectancy <12 months         '[x]'$   No  '[]'$   Yes ? ? ?DID SUBJECT MEET ALL THE ELIGIBILITY CRITERIA?  '[]'$  NO   '[x]'$  YES ? ?If NO, please list Inclusion/Exclusion criteria number(s) not met________________ ? ?INCLUSION/ EXCLUSION CRITERIA REVIEWED BY:  Dr. Bing Quarry ?     Patient is approved for enrollment.   ? ?Form based on [employer] source document  eCRF  Version 5.0 - 21-May-2020 pg 16  ? ?                          ID- [id number],  SOS-AMI ? ?Subject ID: [id number] ?Trainer's name: Shirley Muscat, RN ?Trainer's signature:  on file- Delegation of Authority Log ?Date of visit:   02-Nov-2021   ? ?SOS-AMI Study: Screening ? ?Subject Informed Consent: ?Date of informed consent:  02-Nov-2021 ?Time of informed consent:   13:10_ (24 hour clock) ?Protocol version    _____ ?Local protocol version (if  applicable) _4.[place] ?Demographics: ?Listed in Epic  ? ?Baseline Risk Factors:  ?Second prior AMI within 1 year of screening  '[x]'$   No  '[]'$   Yes ? ?Second prior AMI more than 1 year before screening '[x]'$   No  '[]'$   Yes ? ?Diabetes mellitus defined by ongoing glucose lowering treatment '[]'$   No  '[x]'$  Yes ? ?Chronic kidney disease with estimated glomerular filtration  ?             Rate <60 mL/min/1.73 m2                '[x]'$   No  '[]'$   Yes ? ?5.   Multivessel CAD >/= 50% stenosis in at least 2 coronary territories '[x]'$   No  '[]'$   Yes ? ?6.   PAD defined as any of the following: Ankle/brachial index < 0.85 ?            Amputation, peripheral bypass, or peripheral angioplasty ?           of the extremities secondary to ischemia.   '[x]'$   No   '[]'$   Yes ? ?7.  Absence of coronary revascularization of the qualifying AMI ?         Referred to in inclusion criterion: '[x]'$  No  '[]'$   Yes ? ?8. Active daily smoking at screening:  '[x]'$   No  '[]'$   Yes ?    ? ?P1. Qualifying AMI Characteristics ?    1.1  Diagnosis Date  [id number] ?    1.2  Diagnosis    '[]'$  STEMI  '[x]'$   NSTEMI  '[]'$   Other ?          If other, please specify ___________________________________________ ? ?    1.3  Killip class  '[x]'$  I '[]'$  II '[]'$  III  '[]'$  IV  '[]'$  UNK ? ?    1.4  Were revascularization procedures performed?    '[]'$   No  '[x]'$   Yes ? ?    1.5  Peak cTn value     __265___________ ?    1.6  Type    '[]'$  Troponin T  '[]'$  Troponin I   ?                      '[]'$  High Sensitivity Troponin T    '[x]'$   High Sensitivity Troponin I ?  '[]'$  Unknown ?   1.7  Units  '[]'$  g/L   '[x]'$  ng/L  '[]'$  ng/mL  '[]'$  ug/L '[]'$  Unknown ?                Other, please specify ______________ ?   1.8 Upper Range Limit   __18_____________ ? ?   1.9  Date of discharge:    23-Oct-2021 ? ?2. Medications at discharge ?   2.1   Organic Nitrates  '[]'$   No  '[x]'$   Yes ?   2.2  Beta-blocker   '[]'$   No  '[x]'$   Yes ?   2.3  ACE inhibitor  '[x]'$   No  '[]'$   Yes ?   2.4  Angiotensin II receptor blocker  '[x]'$   No  '[]'$   Yes ?   2.5  Calcium channel blocker   '[]'$   No  '[x]'$   Yes ?   2.6  Aldosterone receptor blocker  '[x]'$   No  '[]'$   Yes ?   2.7  Statin   '[]'$   No  '[x]'$   Yes ?   2.8  PCSK9 inhibitors '[x]'$   No  '[]'$   Yes   ? ?Form based on [employer]IA SOS-AMI_CRF_Version 5.0 - 27OCT2021 pgs 2-5,8     ?                    [id number],  SOS-AMI  ? ?SOS-AMI   VISIT 1  ? ?1. Were body weight and/or height asessed?   '[]'$  No   '[x]'$   Yes ? ?2. Date  _10-APR-2023 ?3. Height    __72__          4.  Units     '[]'$   cm    '[x]'$   inches ? ?5. Height (cm calculated)  inches x 2.54 ___________  cm ? ?6. Weight   ____185          7.  Units     '[]'$   kg     '[x]'$   lbs ? ?8. Weight (kg calculated)    weight lbs x 0.4536   _83.91___ kg ? ?Physical examination ? ?Was the physical examination performed?     '[]'$   No  '[x]'$   Yes ? ?2.  Date     _10-APR-2023 ?Medical History ? ?1 Any clinically significant past and/or concomitant diseases or past procedures?  '[]'$  No  '[x]'$  Yes ?  ?  Main disease or procedure        Start date         End date      Ongoing at  Informed consent ?  ? Coronary angioplasty catheterization      12-Jul-2018 12-Jul-2018     NO  ? Diabetes Mellitus      Unk/unk/ 2008           yes  ?Coronary angioplasty catheterization       22-Oct-2021 22-Oct-2021      No  ?  DES intervention x1       22-Oct-2021   22-Oct-2021      NO  ?     ?Add more lines as needed.  ? ?Most recent LVEF & Procedures Qualifying AMI ? ?Most recent LVEF ?     1.1  Assessment date    22-Oct-2021 ? ?     1.2  Left Ventricular Ejection Fraction (%) '[]'$   < 35   '[]'$   35-50  '[x]'$   >50 ? ?2.  Procedures Qualifying AMI ?     2.1  Coronary- angiography   '[]'$   No  '[x]'$   Yes ? ?    2.2  Percutaneous coronary intervention '[]'$   No  '[x]'$   Yes ? ?    2.3  Coronary Artery Bypass Grafting  '[x]'$   No  '[]'$   Yes ? ?    2.4  Stent insertion    '[]'$   No  '[x]'$   Yes ? ?            2.4.1.   BMS?       '[x]'$  No  '[]'$   yes, Number of BMS  ___________ ? ?            2.4.2.  DES?        '[]'$   No  '[x]'$   yes, Number of DES  ___1________ ? ? 2.4.3 BVS?      '[x]'$   No  '[]'$   yes,  Number of BVS  ___________ ? ?Vital Signs ? ?Were vital signs collected?  '[]'$   No   '[x]'$   Yes ? 1.1  Date      _10-APR-2023 ? 1.2  Time     _13:15_____ 24 hour clock ? 1.3  Heart Rate (bpm)  _59__ ?             ? 1.4  Systolic Blood Pressure (mmHg) _

## 2021-11-02 NOTE — Progress Notes (Signed)
? ? ?Office Visit  ?  ?Patient Name: Scott Donaldson ?Date of Encounter: 11/03/2021 ? ?Primary Care Provider:  [name], Donaldson ?Primary Cardiologist:  Jenkins Rouge, Donaldson ?Primary Electrophysiologist: None ?Chief Complaint  ?  ?Follow-up for coronary PCI/NSTEMI ? ? Patient Profile: ?CAD NSTEMI s/p DES to 90% high-grade OST CX ?DM type II ?LBBB ?HTN ?HLD ?BPH s/p TURP ? ? Recent Studies: ?12/19 Lexiscan: Small apical inferior scar. ?12/19 LHC: Mild to moderate nonobstructive CAD, normal LV systolic function and mild elevated EDP. ?Carotid U/S: 39% stenosis with nonhemodynamically significant plaque ?7/22 TTE: EF 60-65%, normal LV function, no RWMA, mild concentric LVH, grade 1 DD, normal valve function ?3/23 TTE: EF 55-60%, grade 1 DD, elevated LA pressure, normal LV function, no LVH ?3/23 LHC: High-grade ostial left circumflex lesion treated with DES, mild to moderate diffuse disease. ? ?History of Present Illness  ?  ?Scott Donaldson is a 71 y.o. male with PMH of HLD, HTN, BPH s/p TURP, CAD s/p DES to ostial circumflex, DM type II.  Lexiscan and LHC were completed 12/19 with mild nonobstructive CAD following abnormal ECG and atypical chest pain.  Patient presented to [facility] on 3/29 with complaint of 3/10 CP with dizziness. Coronary work-up with Hs troponin [id number] concerning for NSTEMI.  TTE with EF 55-60% and grade 1 DD, LHC completed with ostial left circumflex 90% lesion treated with DES and patient placed on DAPT for 1 year. ? ?Since discharge from hospital the patient reports doing well and denies any current dyspnea or chest pain.  He is compliant with his medications and does report some shortness of breath with Brilinta that he said subsides after his morning coffee. He has been walking and denies any shortness of breath or chest pain with activity. He is accompanied today by his grandchildren and I spoke with his daughter via FaceTime. We discussed his disease process and the importance of DAPT  and preventing recurring heart attack.  The patient and his daughter both expressed a complete understanding and all other questions were answered. Patient is eager to begin cardiac rehab and is looking forward to learning more about preventing heart disease progression. Patient assistance was provided for [place] and Brilinta in office today. He denies chest pain, palpitations, dyspnea, PND, orthopnea, nausea, vomiting, dizziness, syncope, edema, weight gain, or early satiety. ? ? ?Past Medical History  ?  ?Past Medical History:  ?Diagnosis Date  ? Abdominal pain 08/04/2016  ? Abnormal PSA 05/19/2015  ? Anemia   ? Arthralgia 11/12/2010  ? Benign prostatic hyperplasia with urinary hesitancy 06/14/2018  ? Cataract   ? Coronary artery disease   ? nonobstructive  ? Diabetes ([place]) 03/04/2007  ? Qualifier: Diagnosis of  By: Scott Donaldson, Scott Donaldson   ? DIABETES MELLITUS, TYPE II 03/04/2007  ? Elevated LDL cholesterol level 06/14/2018  ? Foot ulcer, right ([place]) 04/15/2014  ? GERD (gastroesophageal reflux disease)   ? HOH (hard of hearing)   ? both ears no hearing aides  ? HYPERCHOLESTEROLEMIA 07/29/2008  ? HYPERTENSION 03/04/2007  ? Knee pain, left 05/16/2015  ? Nonintractable episodic headache 12/01/2016  ? migraine  ? SHOULDER PAIN, BILATERAL 01/12/2010  ? Qualifier: Diagnosis of  By: Scott Donaldson, Scott Donaldson   ? 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 Qualifier: Diagnosis of  By: Scott Donaldson, Scott Donaldson   ? ?Past Surgical History:  ?Procedure Laterality Date  ? COLONOSCOPY  07/2017  ? CORONARY STENT INTERVENTION N/A 10/22/2021  ? Procedure: CORONARY STENT INTERVENTION;  Surgeon: Early Osmond, Donaldson;  Location: [place] [facility];  Service: Cardiovascular;  Laterality: N/A;  ? LEFT HEART CATH AND CORONARY ANGIOGRAPHY N/A 07/12/2018  ? Procedure: LEFT HEART CATH AND CORONARY ANGIOGRAPHY;  Surgeon: Wellington Hampshire, Donaldson;  Location: [place] [facility];  Service: Cardiovascular;  Laterality: N/A;  ? LEFT HEART CATH AND CORONARY ANGIOGRAPHY N/A 10/22/2021   ? Procedure: LEFT HEART CATH AND CORONARY ANGIOGRAPHY;  Surgeon: Early Osmond, Donaldson;  Location: [place] [facility];  Service: Cardiovascular;  Laterality: N/A;  ? stress cardiolite  05/30/2003  ? TRANSURETHRAL RESECTION OF PROSTATE N/A 06/04/2020  ? Procedure: TRANSURETHRAL RESECTION OF THE PROSTATE (TURP), BIPOLAR;  Surgeon: Ceasar Mons, Donaldson;  Location: [facility];  Service: Urology;  Laterality: N/A;  ? ? ?Allergies ? ?No Known Allergies ? ?Home Medications  ?  ?Current Outpatient Medications  ?Medication Sig Dispense Refill  ? amLODipine (NORVASC) 5 MG tablet TAKE 1 TABLET(5 MG) BY MOUTH DAILY (Patient taking differently: Take 5 mg by mouth daily.) 90 tablet 0  ? aspirin EC 81 MG tablet Take 81 mg by mouth daily. Swallow whole.    ? atorvastatin (LIPITOR) 80 MG tablet Take 1 tablet (80 mg total) by mouth daily. 90 tablet 0  ? dapagliflozin propanediol (FARXIGA) 10 MG TABS tablet Take 1 tablet (10 mg total) by mouth daily. 30 tablet 0  ? ezetimibe (ZETIA) 10 MG tablet Take 1 tablet (10 mg total) by mouth daily. 90 tablet 0  ? glucose blood test strip 1 each by Other route 2 (two) times daily. And lancets 2/day 200 each 3  ? insulin glargine (LANTUS SOLOSTAR) 100 UNIT/ML Solostar Pen Inject 25 Units into the skin at bedtime. And pen needles 1/day 30 mL 0  ? Iron, Ferrous Sulfate, 325 (65 Fe) MG TABS Take one daily (Patient taking differently: Take 325 mg by mouth daily. Take one daily) 90 tablet 4  ? magnesium oxide (MAG-OX) 400 (240 Mg) MG tablet Take 1 tablet by mouth daily.    ? metFORMIN (GLUMETZA) 1000 MG (MOD) 24 hr tablet Take 1 tablet (1,000 mg total) by mouth daily. 30 tablet 0  ? metoprolol succinate (TOPROL-XL) 25 MG 24 hr tablet Take 0.5 tablets (12.5 mg total) by mouth daily. 45 tablet 0  ? Study - SOS-AMI - selatogrel 16 mg/0.5 mL or placebo SQ injection (Donaldson-Christopher) Inject 16 mg into the skin once. For Investigational Use Only. Inject 0.5 mL subcutaneously into the  abdomen or thigh if experiencing symptoms of a heart attack. Call 911 immediately and seek emergency medical support. Please contact [place] with any questions or concerns regarding this medication.    ? ticagrelor (BRILINTA) 90 MG TABS tablet Take 1 tablet (90 mg total) by mouth 2 (two) times daily. 720 tablet 0  ? vitamin B-12 (CYANOCOBALAMIN) 1000 MCG tablet Take 1 tablet (1,000 mcg total) by mouth daily. 90 tablet 1  ? Study - SOS-AMI - selatogrel 16 mg/0.5 mL or placebo SQ injection (Donaldson-Christopher) Inject 0.5 mLs (16 mg total) into the skin once for 1 dose. For Investigational Use Only. Inject 0.5 mL subcutaneously into the abdomen or thigh if experiencing symptoms of a heart attack. Call 911 immediately and seek emergency medical support. 1 mL 0  ? ?Current Facility-Administered Medications  ?Medication Dose Route Frequency Provider Last Rate Last Admin  ? Study - SOS-AMI - placebo for selatogrel 0 mg/0.5 mL SQ injection for screening use only (Donaldson-Christopher)  0.5 mL Subcutaneous  Once Buford Dresser, Donaldson      ?  ? ?Review of Systems  ?Please see the history of present illness.    ?(+) SOB and morning ?All other systems reviewed and are otherwise negative except as noted above. ? ?Physical Exam  ?  ?Wt Readings from Last 3 Encounters:  ?11/03/21 190 lb 3.2 oz (86.3 kg)  ?10/21/21 190 lb (86.2 kg)  ?08/21/21 193 lb (87.5 kg)  ? ?VS: ?Vitals:  ? 11/03/21 1008  ?BP: 132/62  ?Pulse: 69  ?SpO2: 98%  ?,Body mass index is 25.8 kg/m?. ? ?Constitutional:   ?   Appearance: Healthy appearance. Not in distress.  ?Neck:  ?   Vascular: JVD normal.  ?Pulmonary:  ?   Effort: Pulmonary effort is normal.  ?   Breath sounds: No wheezing. No rales.  ?Cardiovascular:  ?   Normal rate. Regular rhythm. Normal S1. Normal S2.   ?   Murmurs: There is no murmur.  ?Edema: ?   Peripheral edema absent.  ?Abdominal:  ?   Palpations: Abdomen is soft. There is no hepatomegaly.  ?Skin: ?   General: Skin is warm and  dry.  ?Neurological:  ?   General: No focal deficit present.  ?   Mental Status: Alert and oriented to person, place and time.  ?   Cranial Nerves: Cranial nerves are intact.  ?EKG/LABS/Other Studies

## 2021-11-03 ENCOUNTER — Encounter (HOSPITAL_BASED_OUTPATIENT_CLINIC_OR_DEPARTMENT_OTHER): Payer: Self-pay | Admitting: Nurse Practitioner

## 2021-11-03 ENCOUNTER — Ambulatory Visit (HOSPITAL_BASED_OUTPATIENT_CLINIC_OR_DEPARTMENT_OTHER): Payer: Medicare Other | Admitting: Nurse Practitioner

## 2021-11-03 VITALS — BP 132/62 | HR 69 | Ht 72.0 in | Wt 190.2 lb

## 2021-11-03 DIAGNOSIS — E1165 Type 2 diabetes mellitus with hyperglycemia: Secondary | ICD-10-CM

## 2021-11-03 DIAGNOSIS — I10 Essential (primary) hypertension: Secondary | ICD-10-CM | POA: Diagnosis not present

## 2021-11-03 DIAGNOSIS — E7800 Pure hypercholesterolemia, unspecified: Secondary | ICD-10-CM | POA: Diagnosis not present

## 2021-11-03 DIAGNOSIS — Z794 Long term (current) use of insulin: Secondary | ICD-10-CM

## 2021-11-03 DIAGNOSIS — I2510 Atherosclerotic heart disease of native coronary artery without angina pectoris: Secondary | ICD-10-CM

## 2021-11-03 MED ORDER — NITROGLYCERIN 0.4 MG SL SUBL: 0.4000 mg | SUBLINGUAL_TABLET | SUBLINGUAL | 3 refills | Status: AC | PRN

## 2021-11-03 NOTE — Patient Instructions (Addendum)
Medication Instructions:  ?Your Physician recommend you continue on your current medication as directed.   ? ? ?We had you fill out your patient assistance paperwork today and we will keep you updated.  ?*If you need a refill on your cardiac medications before your next appointment, please call your pharmacy* ? ?Follow-Up: ?At [facility], you and your health needs are our priority.  As part of our continuing mission to provide you with exceptional heart care, we have created designated Provider Care Teams.  These Care Teams include your primary Cardiologist (physician) and Advanced Practice Providers (APPs -  Physician Assistants and Nurse Practitioners) who all work together to provide you with the care you need, when you need it. ? ?We recommend signing up for the patient portal called "MyChart".  Sign up information is provided on this After Visit Summary.  MyChart is used to connect with patients for Virtual Visits (Telemedicine).  Patients are able to view lab/test results, encounter notes, upcoming appointments, etc.  Non-urgent messages can be sent to your provider as well.   ?To learn more about what you can do with MyChart, go to [email or web address].   ? ?Your next appointment:   ?2 month(s) ? ?The format for your next appointment:   ?In Person ? ?Provider:   ?Jenkins Rouge, MD  or Laurann Montana, NP { ? ? ?Other Instructions:  ?We have placed a referral for Cardiac rehab- they will call you to get you started.  ? ? ?Important Information About Sugar ? ? ? ? ? ? ?

## 2021-11-04 ENCOUNTER — Encounter: Payer: Self-pay | Admitting: Gastroenterology

## 2021-11-04 ENCOUNTER — Encounter: Payer: Self-pay | Admitting: Family Medicine

## 2021-11-04 ENCOUNTER — Encounter (HOSPITAL_BASED_OUTPATIENT_CLINIC_OR_DEPARTMENT_OTHER): Payer: Self-pay

## 2021-11-08 ENCOUNTER — Other Ambulatory Visit: Payer: Self-pay | Admitting: Family Medicine

## 2021-11-08 DIAGNOSIS — I2510 Atherosclerotic heart disease of native coronary artery without angina pectoris: Secondary | ICD-10-CM

## 2021-11-08 DIAGNOSIS — E7800 Pure hypercholesterolemia, unspecified: Secondary | ICD-10-CM

## 2021-11-13 ENCOUNTER — Telehealth: Payer: Self-pay

## 2021-11-13 ENCOUNTER — Ambulatory Visit (INDEPENDENT_AMBULATORY_CARE_PROVIDER_SITE_OTHER): Payer: Medicare Other

## 2021-11-13 VITALS — Ht 72.0 in | Wt 190.0 lb

## 2021-11-13 DIAGNOSIS — E1165 Type 2 diabetes mellitus with hyperglycemia: Secondary | ICD-10-CM | POA: Diagnosis not present

## 2021-11-13 DIAGNOSIS — Z794 Long term (current) use of insulin: Secondary | ICD-10-CM

## 2021-11-13 DIAGNOSIS — Z0000 Encounter for general adult medical examination without abnormal findings: Secondary | ICD-10-CM | POA: Diagnosis not present

## 2021-11-13 DIAGNOSIS — Z596 Low income: Secondary | ICD-10-CM

## 2021-11-13 NOTE — Progress Notes (Signed)
? ?Subjective:  ? Scott Donaldson is a 71 y.o. male who presents for Medicare Annual/Subsequent preventive examination. ?Virtual Visit via Telephone Note ? ?I connected with  [name] on 11/13/21 at  9:30 AM EDT by telephone and verified that I am speaking with the correct person using two identifiers. ? ?Location: ?Patient: HOME ?Provider: [facility] ?Persons participating in the virtual visit: patient/Nurse Health Advisor ?  ?I discussed the limitations, risks, security and privacy concerns of performing an evaluation and management service by telephone and the availability of in person appointments. The patient expressed understanding and agreed to proceed. ? ?Interactive audio and video telecommunications were attempted between this nurse and patient, however failed, due to patient having technical difficulties OR patient did not have access to video capability.  We continued and completed visit with audio only. ? ?Some vital signs may be absent or patient reported.  ? ?[name], LPN ? ?Review of Systems    ? ?Cardiac Risk Factors include: advanced age (>75mn, >>61women);diabetes mellitus;dyslipidemia;hypertension;male gender;sedentary lifestyle ? ?   ?Objective:  ?  ?Today's Vitals  ? 11/13/21 0931  ?Weight: 190 lb (86.2 kg)  ?Height: 6' (1.829 m)  ? ?Body mass index is 25.77 kg/m?. ? ? ?  11/13/2021  ?  9:43 AM 10/21/2021  ? 11:16 PM 10/21/2021  ?  3:41 PM 06/04/2020  ?  8:03 AM 04/06/2020  ?  9:53 PM 03/27/2020  ?  9:00 PM 03/27/2020  ?  9:16 AM  ?Advanced Directives  ?Does Patient Have a Medical Advance Directive? No No No No No No No  ?Would patient like information on creating a medical advance directive? No - Patient declined No - Patient declined  No - Patient declined No - Patient declined No - Patient declined No - Patient declined  ? ? ?Current Medications (verified) ?Outpatient Encounter Medications as of 11/13/2021  ?Medication Sig  ? amLODipine (NORVASC) 5 MG tablet TAKE 1  TABLET(5 MG) BY MOUTH DAILY (Patient taking differently: Take 5 mg by mouth daily.)  ? aspirin EC 81 MG tablet Take 81 mg by mouth daily. Swallow whole.  ? atorvastatin (LIPITOR) 80 MG tablet Take 1 tablet (80 mg total) by mouth daily.  ? dapagliflozin propanediol (FARXIGA) 10 MG TABS tablet Take 1 tablet (10 mg total) by mouth daily.  ? ezetimibe (ZETIA) 10 MG tablet Take 1 tablet (10 mg total) by mouth daily.  ? glucose blood test strip 1 each by Other route 2 (two) times daily. And lancets 2/day  ? insulin glargine (LANTUS SOLOSTAR) 100 UNIT/ML Solostar Pen Inject 25 Units into the skin at bedtime. And pen needles 1/day  ? Iron, Ferrous Sulfate, 325 (65 Fe) MG TABS Take one daily (Patient taking differently: Take 325 mg by mouth daily. Take one daily)  ? magnesium oxide (MAG-OX) 400 (240 Mg) MG tablet Take 1 tablet by mouth daily.  ? metFORMIN (GLUMETZA) 1000 MG (MOD) 24 hr tablet Take 1 tablet (1,000 mg total) by mouth daily.  ? metoprolol succinate (TOPROL-XL) 25 MG 24 hr tablet Take 0.5 tablets (12.5 mg total) by mouth daily.  ? nitroGLYCERIN (NITROSTAT) 0.4 MG SL tablet Place 1 tablet (0.4 mg total) under the tongue every 5 (five) minutes as needed for chest pain.  ? Study - SOS-AMI - selatogrel 16 mg/0.5 mL or placebo SQ injection (PI-Christopher) Inject 16 mg into the skin once. For Investigational Use Only. Inject 0.5 mL subcutaneously into the abdomen or thigh if experiencing symptoms of  a heart attack. Call 911 immediately and seek emergency medical support. Please contact [place] with any questions or concerns regarding this medication.  ? ticagrelor (BRILINTA) 90 MG TABS tablet Take 1 tablet (90 mg total) by mouth 2 (two) times daily.  ? vitamin B-12 (CYANOCOBALAMIN) 1000 MCG tablet Take 1 tablet (1,000 mcg total) by mouth daily.  ? atorvastatin (LIPITOR) 40 MG tablet TAKE 1 TABLET(40 MG) BY MOUTH DAILY (Patient not taking: Reported on 11/13/2021)  ? Study - SOS-AMI - selatogrel 16  mg/0.5 mL or placebo SQ injection (PI-Christopher) Inject 0.5 mLs (16 mg total) into the skin once for 1 dose. For Investigational Use Only. Inject 0.5 mL subcutaneously into the abdomen or thigh if experiencing symptoms of a heart attack. Call 911 immediately and seek emergency medical support.  ? ?Facility-Administered Encounter Medications as of 11/13/2021  ?Medication  ? Study - SOS-AMI - placebo for selatogrel 0 mg/0.5 mL SQ injection for screening use only (PI-Christopher)  ? ? ?Allergies (verified) ?Patient has no known allergies.  ? ?History: ?Past Medical History:  ?Diagnosis Date  ? Abdominal pain 08/04/2016  ? Abnormal PSA 05/19/2015  ? Anemia   ? Arthralgia 11/12/2010  ? Benign prostatic hyperplasia with urinary hesitancy 06/14/2018  ? Cataract   ? Coronary artery disease   ? nonobstructive  ? Diabetes ([place]) 03/04/2007  ? Qualifier: Diagnosis of  By: Loanne Drilling MD, Jacelyn Pi   ? DIABETES MELLITUS, TYPE II 03/04/2007  ? Elevated LDL cholesterol level 06/14/2018  ? Foot ulcer, right ([place]) 04/15/2014  ? GERD (gastroesophageal reflux disease)   ? HOH (hard of hearing)   ? both ears no hearing aides  ? HYPERCHOLESTEROLEMIA 07/29/2008  ? HYPERTENSION 03/04/2007  ? Knee pain, left 05/16/2015  ? Nonintractable episodic headache 12/01/2016  ? migraine  ? SHOULDER PAIN, BILATERAL 01/12/2010  ? Qualifier: Diagnosis of  By: Loanne Drilling MD, Jacelyn Pi   ? 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 Qualifier: Diagnosis of  By: Loanne Drilling MD, Jacelyn Pi   ? ?Past Surgical History:  ?Procedure Laterality Date  ? COLONOSCOPY  07/2017  ? CORONARY STENT INTERVENTION N/A 10/22/2021  ? Procedure: CORONARY STENT INTERVENTION;  Surgeon: Early Osmond, MD;  Location: [place] [facility];  Service: Cardiovascular;  Laterality: N/A;  ? LEFT HEART CATH AND CORONARY ANGIOGRAPHY N/A 07/12/2018  ? Procedure: LEFT HEART CATH AND CORONARY ANGIOGRAPHY;  Surgeon: Wellington Hampshire, MD;  Location: [place] [facility];  Service: Cardiovascular;  Laterality: N/A;  ? LEFT HEART  CATH AND CORONARY ANGIOGRAPHY N/A 10/22/2021  ? Procedure: LEFT HEART CATH AND CORONARY ANGIOGRAPHY;  Surgeon: Early Osmond, MD;  Location: [place] [facility];  Service: Cardiovascular;  Laterality: N/A;  ? stress cardiolite  05/30/2003  ? TRANSURETHRAL RESECTION OF PROSTATE N/A 06/04/2020  ? Procedure: TRANSURETHRAL RESECTION OF THE PROSTATE (TURP), BIPOLAR;  Surgeon: Ceasar Mons, MD;  Location: [facility];  Service: Urology;  Laterality: N/A;  ? ?Family History  ?Problem Relation Age of Onset  ? Cancer Mother   ?     Breast Cancer  ? Cancer Father   ?     uncertain type  ? Diabetes Father   ? Cancer Brother 65  ?     Colon Cancer  ? Colon cancer Neg Hx   ? Rectal cancer Neg Hx   ? Stomach cancer Neg Hx   ? ?Social History  ? ?Socioeconomic History  ? Marital status: Single  ?  Spouse name: Not  on file  ? Number of children: 2  ? Years of education: 10  ? Highest education level: Not on file  ?Occupational History  ? Occupation: roofer  ?Tobacco Use  ? Smoking status: Former  ?  Packs/day: 1.50  ?  Years: 25.00  ?  Pack years: 37.50  ?  Types: Cigarettes  ?  Quit date: 07/26/1988  ?  Years since quitting: 33.3  ? Smokeless tobacco: Never  ?Vaping Use  ? Vaping Use: Never used  ?Substance and Sexual Activity  ? Alcohol use: No  ? Drug use: No  ? Sexual activity: Not on file  ?Other Topics Concern  ? Not on file  ?Social History Narrative  ? Lives with daughter in a one story home.  Has 2 children.  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  Education: 10th grade.   ? ?Social Determinants of Health  ? ?Financial Resource Strain: Low Risk   ? Difficulty of Paying Living Expenses: Not hard at all  ?Food Insecurity: No Food Insecurity  ? Worried About Charity fundraiser in the Last Year: Never true  ? Ran Out of Food in the Last Year: Never true  ?Transportation Needs: No Transportation Needs  ? Lack of Transportation (Medical): No  ? Lack of Transportation (Non-Medical): No  ?Physical Activity:  Insufficiently Active  ? Days of Exercise per Week: 2 days  ? Minutes of Exercise per Session: 20 min  ?Stress: No Stress Concern Present  ? Feeling of Stress : Not at all  ?Social Connections: Moderately Int

## 2021-11-13 NOTE — Patient Instructions (Addendum)
Scott Donaldson , ?Thank you for taking time to come for your Medicare Wellness Visit. I appreciate your ongoing commitment to your health goals. Please review the following plan we discussed and let me know if I can assist you in the future.  ? ?Screening recommendations/referrals: ?Colonoscopy: Discussed. ? ?Recommended yearly ophthalmology/optometry visit for glaucoma screening and checkup ?Recommended yearly dental visit for hygiene and checkup ? ?Vaccinations: ?Influenza vaccine: Due Fall 2023. ?Pneumococcal vaccine: Done 05/16/2015, 08/04/2016 and 03/28/2020 ?Tdap vaccine: Done 09/02/2016 Repeat in 10 years ? ?Shingles vaccine: Discussed.   ?Covid-19: Declined. ? ?Advanced directives: Advance directive discussed with you today. Even though you declined this today, please call our office should you change your mind, and we can give you the proper paperwork for you to fill out. ? ? ?Conditions/risks identified: Aim for 30 minutes of exercise, 6-8 glasses of water, and 5 servings of fruits and vegetables each day. ? ? ?Next appointment: Follow up in one year for your annual wellness visit. 11/17/2019 @ 9:30 AM. ? ?Preventive Care 72 Years and Older, Male ? ?Preventive care refers to lifestyle choices and visits with your health care provider that can promote health and wellness. ?What does preventive care include? ?A yearly physical exam. This is also called an annual well check. ?Dental exams once or twice a year. ?Routine eye exams. Ask your health care provider how often you should have your eyes checked. ?Personal lifestyle choices, including: ?Daily care of your teeth and gums. ?Regular physical activity. ?Eating a healthy diet. ?Avoiding tobacco and drug use. ?Limiting alcohol use. ?Practicing safe sex. ?Taking low doses of aspirin every day. ?Taking vitamin and mineral supplements as recommended by your health care provider. ?What happens during an annual well check? ?The services and screenings done by your health  care provider during your annual well check will depend on your age, overall health, lifestyle risk factors, and family history of disease. ?Counseling  ?Your health care provider may ask you questions about your: ?Alcohol use. ?Tobacco use. ?Drug use. ?Emotional well-being. ?Home and relationship well-being. ?Sexual activity. ?Eating habits. ?History of falls. ?Memory and ability to understand (cognition). ?Work and work Statistician. ?Screening  ?You may have the following tests or measurements: ?Height, weight, and BMI. ?Blood pressure. ?Lipid and cholesterol levels. These may be checked every 5 years, or more frequently if you are over 32 years old. ?Skin check. ?Lung cancer screening. You may have this screening every year starting at age 72 if you have a 30-pack-year history of smoking and currently smoke or have quit within the past 15 years. ?Fecal occult blood test (FOBT) of the stool. You may have this test every year starting at age 25. ?Flexible sigmoidoscopy or colonoscopy. You may have a sigmoidoscopy every 5 years or a colonoscopy every 10 years starting at age 72. ?Prostate cancer screening. Recommendations will vary depending on your family history and other risks. ?Hepatitis C blood test. ?Hepatitis B blood test. ?Sexually transmitted disease (STD) testing. ?Diabetes screening. This is done by checking your blood sugar (glucose) after you have not eaten for a while (fasting). You may have this done every 1-3 years. ?Abdominal aortic aneurysm (AAA) screening. You may need this if you are a current or former smoker. ?Osteoporosis. You may be screened starting at age 72 if you are at high risk. ?Talk with your health care provider about your test results, treatment options, and if necessary, the need for more tests. ?Vaccines  ?Your health care provider may recommend certain vaccines,  such as: ?Influenza vaccine. This is recommended every year. ?Tetanus, diphtheria, and acellular pertussis (Tdap, Td)  vaccine. You may need a Td booster every 10 years. ?Zoster vaccine. You may need this after age 72. ?Pneumococcal 13-valent conjugate (PCV13) vaccine. One dose is recommended after age 72. ?Pneumococcal polysaccharide (PPSV23) vaccine. One dose is recommended after age 72. ?Talk to your health care provider about which screenings and vaccines you need and how often you need them. ?This information is not intended to replace advice given to you by your health care provider. Make sure you discuss any questions you have with your health care provider. ?Document Released: 08/08/2015 Document Revised: 03/31/2016 Document Reviewed: 05/13/2015 ?Elsevier Interactive Patient Education ? 2017 [place]. ? ?Fall Prevention in the Home ?Falls can cause injuries. They can happen to people of all ages. There are many things you can do to make your home safe and to help prevent falls. ?What can I do on the outside of my home? ?Regularly fix the edges of walkways and driveways and fix any cracks. ?Remove anything that might make you trip as you walk through a door, such as a raised step or threshold. ?Trim any bushes or trees on the path to your home. ?Use bright outdoor lighting. ?Clear any walking paths of anything that might make someone trip, such as rocks or tools. ?Regularly check to see if handrails are loose or broken. Make sure that both sides of any steps have handrails. ?Any raised decks and porches should have guardrails on the edges. ?Have any leaves, snow, or ice cleared regularly. ?Use sand or salt on walking paths during winter. ?Clean up any spills in your garage right away. This includes oil or grease spills. ?What can I do in the bathroom? ?Use night lights. ?Install grab bars by the toilet and in the tub and shower. Do not use towel bars as grab bars. ?Use non-skid mats or decals in the tub or shower. ?If you need to sit down in the shower, use a plastic, non-slip stool. ?Keep the floor dry. Clean up any  water that spills on the floor as soon as it happens. ?Remove soap buildup in the tub or shower regularly. ?Attach bath mats securely with double-sided non-slip rug tape. ?Do not have throw rugs and other things on the floor that can make you trip. ?What can I do in the bedroom? ?Use night lights. ?Make sure that you have a light by your bed that is easy to reach. ?Do not use any sheets or blankets that are too big for your bed. They should not hang down onto the floor. ?Have a firm chair that has side arms. You can use this for support while you get dressed. ?Do not have throw rugs and other things on the floor that can make you trip. ?What can I do in the kitchen? ?Clean up any spills right away. ?Avoid walking on wet floors. ?Keep items that you use a lot in easy-to-reach places. ?If you need to reach something above you, use a strong step stool that has a grab bar. ?Keep electrical cords out of the way. ?Do not use floor polish or wax that makes floors slippery. If you must use wax, use non-skid floor wax. ?Do not have throw rugs and other things on the floor that can make you trip. ?What can I do with my stairs? ?Do not leave any items on the stairs. ?Make sure that there are handrails on both sides of the stairs and  use them. Fix handrails that are broken or loose. Make sure that handrails are as long as the stairways. ?Check any carpeting to make sure that it is firmly attached to the stairs. Fix any carpet that is loose or worn. ?Avoid having throw rugs at the top or bottom of the stairs. If you do have throw rugs, attach them to the floor with carpet tape. ?Make sure that you have a light switch at the top of the stairs and the bottom of the stairs. If you do not have them, ask someone to add them for you. ?What else can I do to help prevent falls? ?Wear shoes that: ?Do not have high heels. ?Have rubber bottoms. ?Are comfortable and fit you well. ?Are closed at the toe. Do not wear sandals. ?If you use a  stepladder: ?Make sure that it is fully opened. Do not climb a closed stepladder. ?Make sure that both sides of the stepladder are locked into place. ?Ask someone to hold it for you, if possible. ?Clearly m

## 2021-11-13 NOTE — Progress Notes (Signed)
Per Clinical pharmacist, please reschedule patient appointment on 12/03/2021 at 3:45 pm to either a different day or time. ? ?Telephone follow up appointment with Care management team member scheduled for : 12/03/2021 at 1:00 pm. ? ?Anderson Malta ?Clinical Pharmacist Assistant ?[phone]  ? ? ?

## 2021-11-16 ENCOUNTER — Encounter: Payer: Self-pay | Admitting: Family Medicine

## 2021-11-16 ENCOUNTER — Ambulatory Visit (INDEPENDENT_AMBULATORY_CARE_PROVIDER_SITE_OTHER): Payer: Medicare Other | Admitting: Family Medicine

## 2021-11-16 VITALS — BP 152/76 | HR 63 | Temp 98.4°F | Ht 72.0 in | Wt 188.4 lb

## 2021-11-16 DIAGNOSIS — I214 Non-ST elevation (NSTEMI) myocardial infarction: Secondary | ICD-10-CM

## 2021-11-16 DIAGNOSIS — Z09 Encounter for follow-up examination after completed treatment for conditions other than malignant neoplasm: Secondary | ICD-10-CM

## 2021-11-16 DIAGNOSIS — R413 Other amnesia: Secondary | ICD-10-CM | POA: Diagnosis not present

## 2021-11-16 LAB — BASIC METABOLIC PANEL
BUN: 16 mg/dL (ref 6–23)
CO2: 23 mEq/L (ref 19–32)
Calcium: 9 mg/dL (ref 8.4–10.5)
Chloride: 104 mEq/L (ref 96–112)
Creatinine, Ser: 0.86 mg/dL (ref 0.40–1.50)
GFR: 87.11 mL/min (ref >60.00)
Glucose, Bld: 181 mg/dL — ABNORMAL HIGH (ref 70–99)
Potassium: 3.6 mEq/L (ref 3.5–5.1)
Sodium: 140 mEq/L (ref 135–145)

## 2021-11-16 LAB — CBC
HCT: 38.7 % — ABNORMAL LOW (ref 39.0–52.0)
Hemoglobin: 13.2 g/dL (ref 13.0–17.0)
MCHC: 34.2 g/dL (ref 30.0–36.0)
MCV: 85.7 fl (ref 78.0–100.0)
Platelets: 164 10*3/uL (ref 150.0–400.0)
RBC: 4.51 Mil/uL (ref 4.22–5.81)
RDW: 14.1 % (ref 11.5–15.5)
WBC: 6.5 10*3/uL (ref 4.0–10.5)

## 2021-11-16 LAB — TSH: TSH: 2.01 u[IU]/mL (ref 0.35–5.50)

## 2021-11-16 LAB — VITAMIN B12: Vitamin B-12: 279 pg/mL (ref 211–911)

## 2021-11-16 NOTE — Progress Notes (Signed)
? ?Established Patient Office Visit ? ?Subjective   ?Patient ID: Scott Donaldson, male    DOB: [birth year]  Age: 71 y.o. MRN: [id number] ? ?Chief Complaint  ?Patient presents with  ? Hospitalization Follow-up  ?  Hospital follow up from heart attack few weeks ago, patients daughter also have concerns about memory loss. Patient fasting.   ? ? 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  Doing well.  He is in cardiac rehab.  He is walking up and down his street without chest pain or shortness of breath.  Blood pressure has been running in the 130s to 140s over 70s to 80s.  Diabetes has been poorly controlled.  Daughter says that his insulin regimen was changed while he was in the hospital.  Follow-up is planned with endocrinology.  Daughter is concerned about his memory status post recent MI.  She says that he has become confused about taking his medicines.  It was unclear as to whether or not they are using a pillbox.  Patient has not gotten lost driving.  He has not gotten lost walking up and down the street.  TSH was slightly elevated in the hospital. ? ? ? ?Review of Systems  ?Constitutional: Negative.   ?HENT: Negative.    ?Eyes:  Negative for blurred vision, discharge and redness.  ?Respiratory: Negative.  Negative for shortness of breath.   ?Cardiovascular: Negative.  Negative for chest pain.  ?Gastrointestinal:  Negative for abdominal pain.  ?Genitourinary: Negative.   ?Musculoskeletal: Negative.  Negative for myalgias.  ?Skin:  Negative for rash.  ?Neurological:  Negative for tingling, loss of consciousness and weakness.  ?Endo/Heme/Allergies:  Negative for polydipsia.  ? ?Patient SCORE A 4 on the Mini-Mental status exam.  He was able to recall 2 out of 3 words. ?  ?Objective:  ?  ? ?BP (!) 152/76 (BP Location: Right Arm, Patient Position: Sitting, Cuff Size: Normal)   Pulse 63   Temp 98.4 ?F (36.9 ?C) (Temporal)    Ht 6' (1.829 m)   Wt 188 lb 6.4 oz (85.5 kg)   SpO2 99%   BMI 25.55 kg/m?  ? ? ?Physical Exam ?Constitutional:   ?   General: He is not in acute distress. ?   Appearance: Normal appearance. He is not ill-appearing, toxic-appearing or diaphoretic.  ?HENT:  ?   Head: Normocephalic and atraumatic.  ?   Right Ear: External ear normal.  ?   Left Ear: External ear normal.  ?   Mouth/Throat:  ?   Mouth: Mucous membranes are moist.  ?   Pharynx: Oropharynx is clear. No oropharyngeal exudate or posterior oropharyngeal erythema.  ?Eyes:  ?   General: No scleral icterus.    ?   Right eye: No discharge.     ?   Left eye: No discharge.  ?   Extraocular Movements: Extraocular movements intact.  ?   Conjunctiva/sclera: Conjunctivae normal.  ?   Pupils: Pupils are equal, round, and reactive to light.  ?Cardiovascular:  ?   Rate and Rhythm: Normal rate and regular rhythm.  ?Pulmonary:  ?   Effort: Pulmonary effort is normal. No respiratory distress.  ?   Breath sounds: Normal breath sounds.  ?Abdominal:  ?   General: Bowel sounds are normal.  ?Musculoskeletal:  ?   Cervical back: No rigidity or tenderness.  ?Skin: ?   General: Skin is warm and dry.  ?Neurological:  ?  Mental Status: He is alert and oriented to person, place, and time.  ?Psychiatric:     ?   Mood and Affect: Mood normal.     ?   Behavior: Behavior normal.  ? ? ? ?No results found for any visits on 11/16/21. ? ? ? ?The ASCVD Risk score (Arnett DK, et al., 2019) failed to calculate for the following reasons: ?  The patient has a prior MI or stroke diagnosis ? ?  ?Assessment & Plan:  ? ?Problem List Items Addressed This Visit   ? ?  ? Other  ? Hospital discharge follow-up - Primary  ? Relevant Orders  ? CBC  ? Basic metabolic panel  ? ?Other Visit Diagnoses   ? ? Memory change      ? Relevant Orders  ? Vitamin B12  ? TSH  ? ?  ? ? ?Return in about 3 months (around 02/15/2022), or if symptoms worsen or fail to improve.  ?Discussed the importance of blood pressure,  cholesterol and diabetes control regarding his long-term health including brain health.  Advised to use pill dispenser and follow-up with endocrinology.  Continue all medicines as above. ? ?[name], MD ? ?

## 2021-11-19 NOTE — Research (Signed)
While the consenting process was done by the appropriate Research Nurse, Berneda Rose, RN, the [facility], Dr. Bing Quarry was available to answer any questions the patient may have.  ?

## 2021-11-20 ENCOUNTER — Encounter: Payer: Self-pay | Admitting: *Deleted

## 2021-11-20 DIAGNOSIS — Z006 Encounter for examination for normal comparison and control in clinical research program: Secondary | ICD-10-CM

## 2021-11-24 NOTE — Research (Signed)
[id number], [facility]    ? ?FOLLOW-UP PHONE CALLS ? ?SUBJECT ID:  [id number] ?Trainer's name: Shirley Muscat, RN ?Trainer's signature: on Delegation of Authority Log ?Date of the Follow up call:     20-Nov-2021 ? ?Visit Number: 2 ?Start time of the Follow up call:   1400  End time of call: 1415 ? ?Patient states that he is doing well overall and is currently participating in the Cardiac Rehab program. He is walking each day around his neighborhood. He reports no CP, SHOB, AEs, SAEs, Hospitalizations or Urgent Care visits since leaving the hospital. He is having some confusion at times according to his daughter. Both the patient and his daughter answered all the study questions appropriately. They have my contact information should they have any questions or concerns.  ? ?- CONTACT ? ? ?Q1;  Was the follow up phone call done with the subject?  '[x]'$   YES  '[]'$   NO ?          If NO, check the following:  ? ?   Q2:    Was the follow up phone call done with a family member  ?             Or caregiver?    '[x]'$   YES   '[]'$   NO  ?        Make note about reasons ___________________________________   ? ?AUTOINJECTOR LABEL:  Still legible?  '[x]'$   Yes  '[]'$   No ? ?- MEDICAL CONDITION     ? ?Q3:  Did any of the following occur?   '[]'$   Death ?  NONE    '[]'$   Hospitalization (any cause) ?        '[]'$   Use of autoinjector ? ?Make note about the type of event, and when it occurred. In case of hospitalization: the ?Location of the hospital and/or the treating physician's contact details  ____________ ?____________________________________________________________________ ?____________________________________________________________________ ? ?Note: remember to report any SAE/AE which has occurred within 30 days after any  ?injection of the study drug on relevant eCRF forms.  ? ?Q4:  Did the subject develop any condition which is an  ?        exclusion criterion?  '[]'$   YES  '[x]'$   NO  ? ?Take note about the occurrence of any  exclusion criterion after randomization: ?_________________________________________________________________ ?__________________________________________________________________ ? ?Q5: Was there any change in subject's antithrombotic therapy?   '[]'$   YES  '[x]'$   NO    ? ?Take not about any change of antithrombotic treatment: _______________________ ?____________________________________________________________________ ?____________________________________________________________________     ? ?- RECOLLECTION OF THE STUDY-SPECFIC TRAINING ? ?Q6: Did the subject correctly reply to the following questions? ? ?     A.  What are common heart attack symptoms?      '[x]'$   YES   '[]'$   NO ?     B.  What has to be done in case any of those symptoms occurs? '[x]'$   YES  '[]'$   NO ?     C.  What are the main steps to perform a self-injection?  '[x]'$   YES  '[]'$   NO ? ?           If NO, then report which step/s was/were missing:  ?      '[]'$   Choose injection site (abdomen or thigh) ?      '[]'$   Twist cap off ?      '[]'$   Pinch skin and place the study autoinjector ?      '[]'$   Firmly push down and hold for 3 seconds ? ?     D. What has to be done immediately after an injection?  '[x]'$   YES   '[]'$   NO ? ?        If NO, then report which step/s was/were missing: ?      '[]'$   Call  for emergency medical help ?      '[]'$   Show the autoinjector to the emergency medical responder ? ?     E.  Does the subject recall where s/he keeps/ stores the autoinjectors? '[x]'$  YES  '[]'$  NO ?         Note the place of storage and any corrective explanation if needed below ?__________________________________________________________________ ?__________________________________________________________________ ? ?- TRAINING REFRESHER  ? ?Q7:  Is a training refresher needed?      '[]'$  YES  '[x]'$  NO ?       If YES, indicate items that have to be refreshed. More than one may apply:  ? ?            '[]'$   Heart attack symptoms ?            '[]'$   Actions to be taken following heart attack symptoms ?             '[]'$   Steps to perform the self-injection and follow-up actions to be taken ?  '[]'$   Other, Specify  _________________________________________   ? ? ?Form Based on [facility]_CRF_Version 5.0_[id number] [id number], 62,  ?          and eCRF  Version 5.0-  21-May-2020 ? ? ?Current Outpatient Medications:  ?  amLODipine (NORVASC) 5 MG tablet, TAKE 1 TABLET(5 MG) BY MOUTH DAILY (Patient taking differently: Take 5 mg by mouth daily.), Disp: 90 tablet, Rfl: 0 ?  aspirin EC 81 MG tablet, Take 81 mg by mouth daily. Swallow whole., Disp: , Rfl:  ?  atorvastatin (LIPITOR) 80 MG tablet, Take 1 tablet (80 mg total) by mouth daily., Disp: 90 tablet, Rfl: 0 ?  dapagliflozin propanediol (FARXIGA) 10 MG TABS tablet, Take 1 tablet (10 mg total) by mouth daily., Disp: 30 tablet, Rfl: 0 ?  ezetimibe (ZETIA) 10 MG tablet, Take 1 tablet (10 mg total) by mouth daily., Disp: 90 tablet, Rfl: 0 ?  glucose blood test strip, 1 each by Other route 2 (two) times daily. And lancets 2/day, Disp: 200 each, Rfl: 3 ?  insulin glargine (LANTUS SOLOSTAR) 100 UNIT/ML Solostar Pen, Inject 25 Units into the skin at bedtime. And pen needles 1/day, Disp: 30 mL, Rfl: 0 ?  Iron, Ferrous Sulfate, 325 (65 Fe) MG TABS, Take one daily (Patient taking differently: Take 325 mg by mouth daily. Take one daily), Disp: 90 tablet, Rfl: 4 ?  magnesium oxide (MAG-OX) 400 (240 Mg) MG tablet, Take 1 tablet by mouth daily., Disp: , Rfl:  ?  metFORMIN (GLUMETZA) 1000 MG (MOD) 24 hr tablet, Take 1 tablet (1,000 mg total) by mouth daily., Disp: 30 tablet, Rfl: 0 ?  metoprolol succinate (TOPROL-XL) 25 MG 24 hr tablet, Take 0.5 tablets (12.5 mg total) by mouth daily., Disp: 45 tablet, Rfl: 0 ?  nitroGLYCERIN (NITROSTAT) 0.4 MG SL tablet, Place 1 tablet (0.4 mg total) under the tongue every 5 (five) minutes as needed for chest pain., Disp: 25 tablet, Rfl: 3 ?  Study - [facility] - selatogrel 16 mg/0.5 mL or placebo SQ injection (PI-Christ[facility]), Inject 16 mg into the skin once. For  Investigational Use Only. Inject  0.5 mL subcutaneously into the abdomen or thigh if experiencing symptoms of a heart attack. Call 911 immediately and seek emergency medical support. Please contact [place] with any questions or concerns regarding this medication., Disp: , Rfl:  ?  Study - [facility] - selatogrel 16 mg/0.5 mL or placebo SQ injection (PI-Christopher), Inject 0.5 mLs (16 mg total) into the skin once for 1 dose. For Investigational Use Only. Inject 0.5 mL subcutaneously into the abdomen or thigh if experiencing symptoms of a heart attack. Call 911 immediately and seek emergency medical support., Disp: 1 mL, Rfl: 0 ?  ticagrelor (BRILINTA) 90 MG TABS tablet, Take 1 tablet (90 mg total) by mouth 2 (two) times daily., Disp: 720 tablet, Rfl: 0 ?  vitamin B-12 (CYANOCOBALAMIN) 1000 MCG tablet, Take 1 tablet (1,000 mcg total) by mouth daily., Disp: 90 tablet, Rfl: 1 ? ?Current Facility-Administered Medications:  ?  Study - [facility] - placebo for selatogrel 0 mg/0.5 mL SQ injection for screening use only (PI-Christopher), 0.5 mL, Subcutaneous, Once, Buford Dresser, MD   ? ? ? ? ? ? ? ? ? ?                ?

## 2021-12-02 ENCOUNTER — Telehealth: Payer: Self-pay

## 2021-12-02 IMAGING — CT CT HEAD W/O CM
3 series · 15 of 46 positions shown, 18 images · non-contrast
Comparison: Weeks 06/15/2018

CLINICAL DATA: Nonspecific dizziness for 2 weeks

EXAM:
CT HEAD WITHOUT CONTRAST
TECHNIQUE: Contiguous axial images were obtained from the base of the skull
through the vertex without intravenous contrast.

[Series 2: head wo · axial · 0.39mm/px · z∈[-148,-28]mm · 9 of 29 slices shown, 12 images]
[im 3/29  brain]
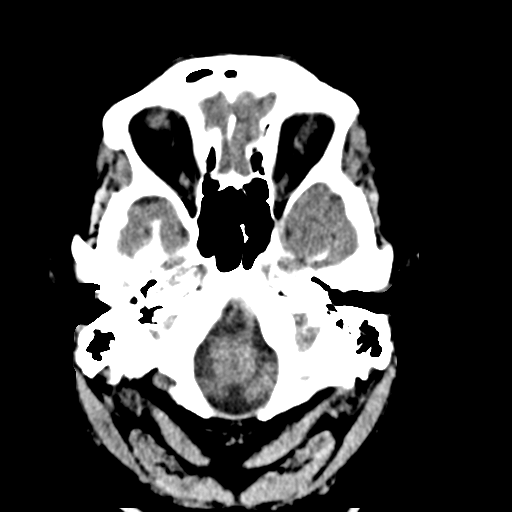
[im 3/29  bone]
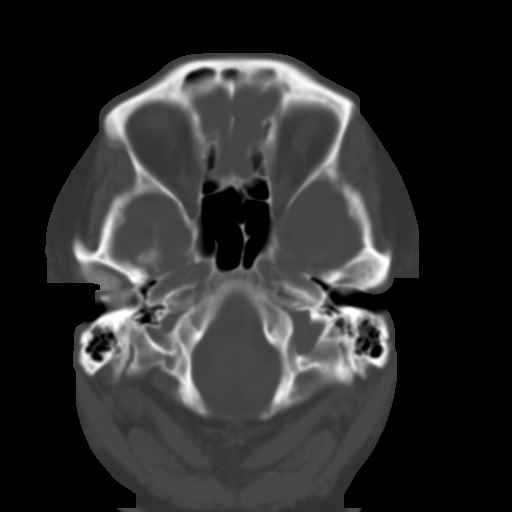
[im 6/29  brain]
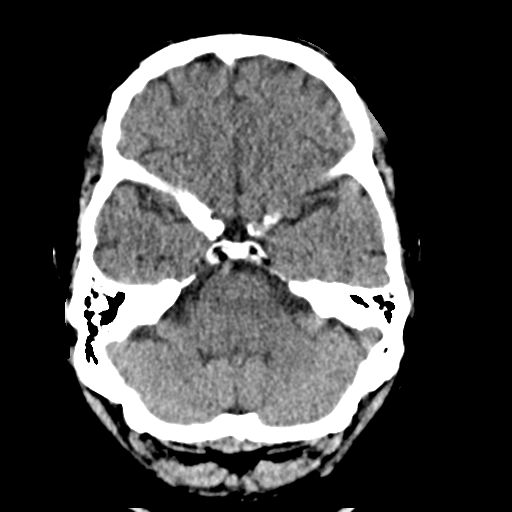
[im 9/29  brain]
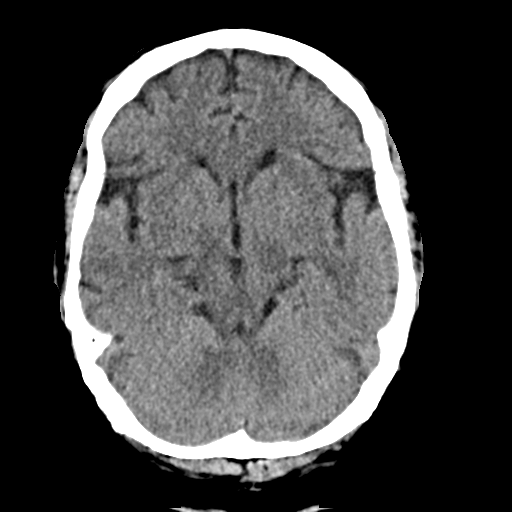
[im 12/29  brain]
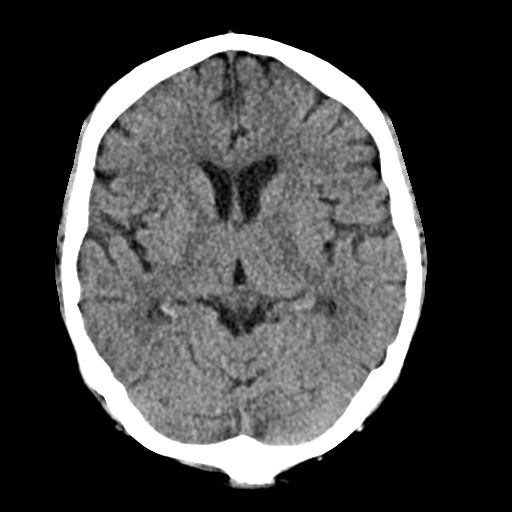
[im 15/29  brain]
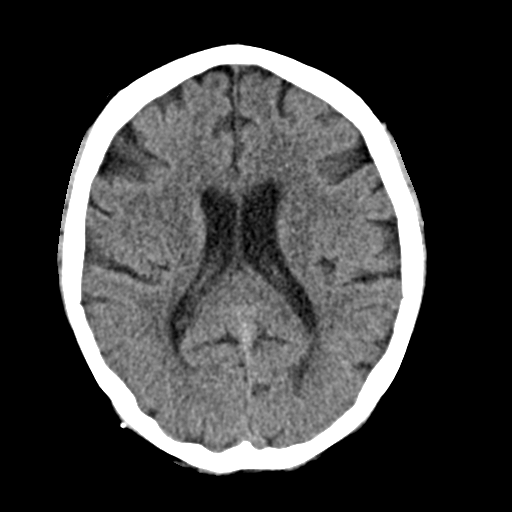
[im 15/29  bone]
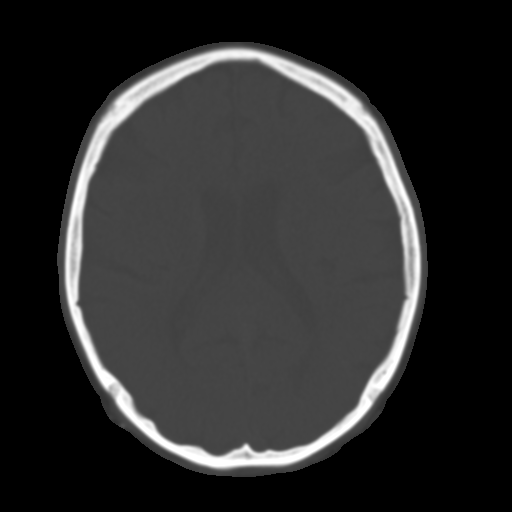
[im 18/29  brain]
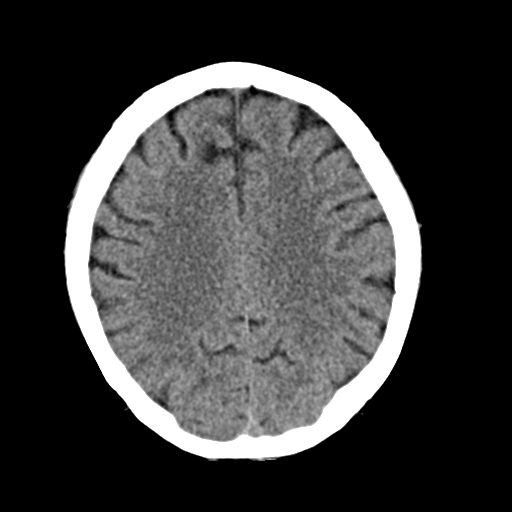
[im 21/29  brain]
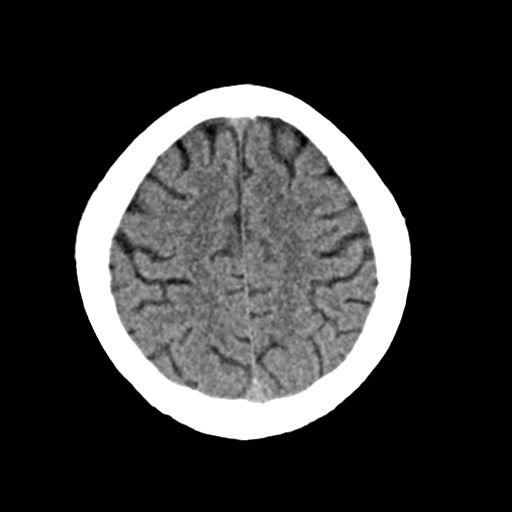
[im 24/29  brain]
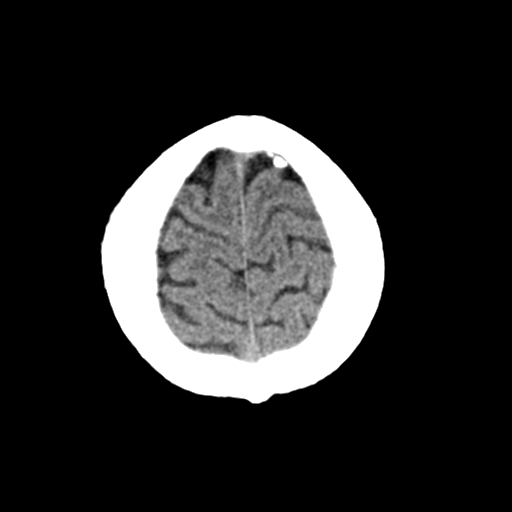
[im 27/29  brain]
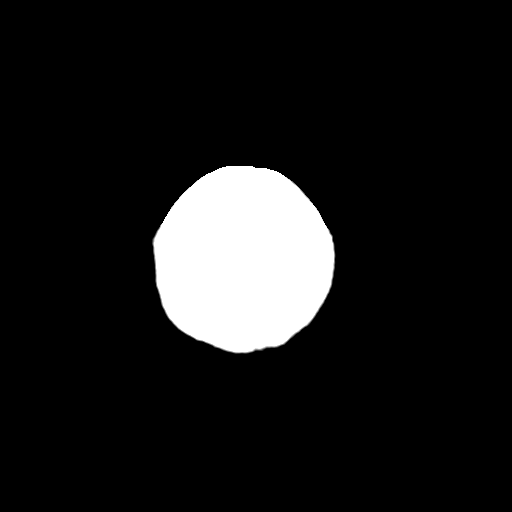
[im 27/29  bone]
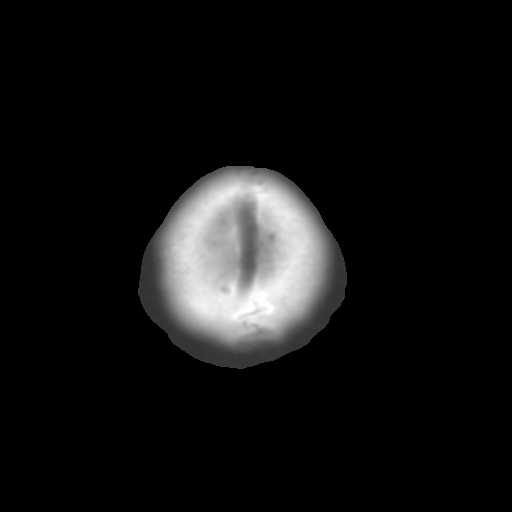

[Series 4: coronal soft · coronal · 0.28mm/px · 3 of 61 slices shown]
[im 21/61  brain]
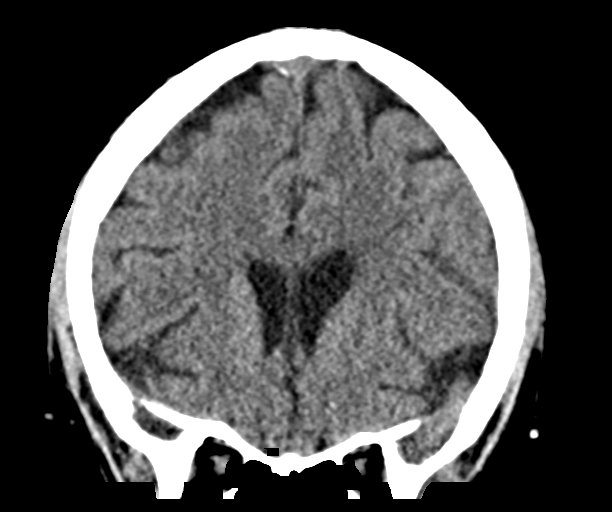
[im 27/61  brain]
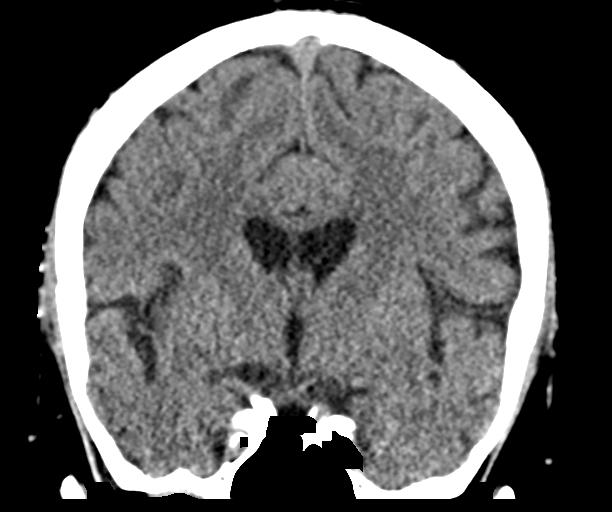
[im 34/61  brain]
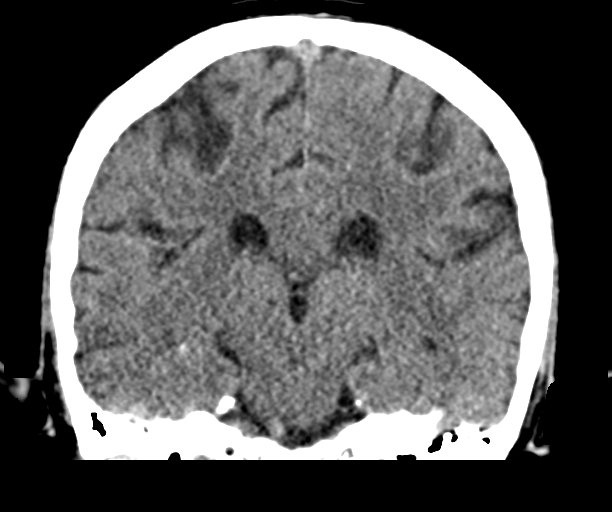

[Series 5: sag soft · sagittal · 0.28mm/px · 3 of 57 slices shown]
[im 19/57  brain]
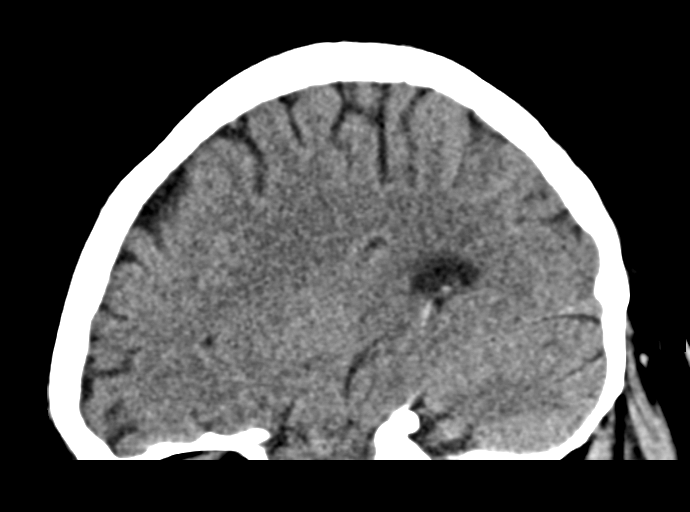
[im 29/57  brain]
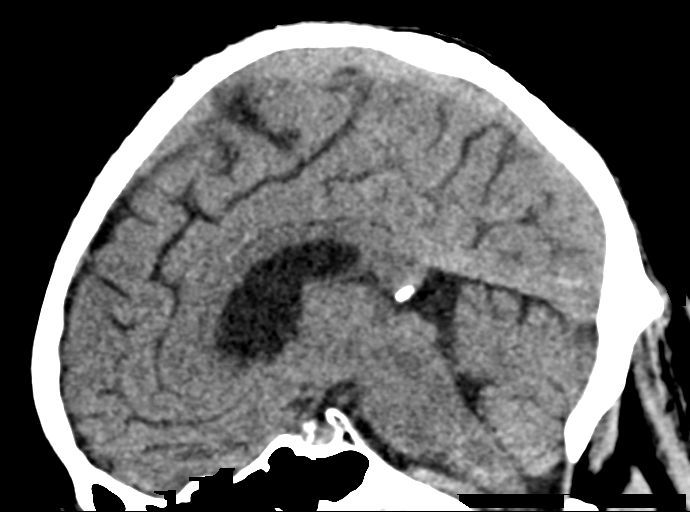
[im 38/57  brain]
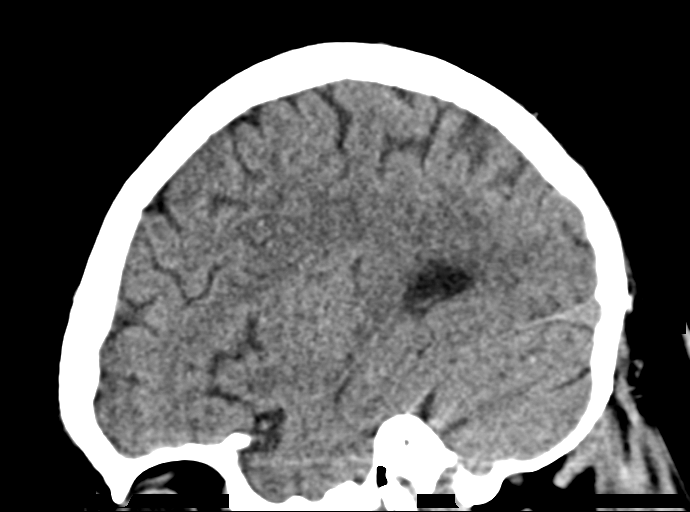

[15 of 46 positions shown; findings below may reference images not displayed]

FINDINGS: Brain: No evidence of acute infarction, hemorrhage, hydrocephalus,
extra-axial collection or mass lesion/mass effect.

Vascular: No hyperdense vessel or unexpected calcification.

Skull: Normal. Negative for fracture or focal lesion.

Sinuses/Orbits: No acute finding.
IMPRESSION: Negative head CT.

## 2021-12-02 NOTE — Progress Notes (Signed)
Chronic Care Management APPOINTMENT REMINDER  Scott Donaldson was reminded to have all medications, supplements and any blood glucose and blood pressure readings available for review with [name], Pharm. D, at his telephone visit on 12/03/2021 at 1:00 pm.  Patient daughter confirm appointment and stated to call [phone].  [place] Pharmacist Assistant [phone]

## 2021-12-03 ENCOUNTER — Ambulatory Visit (INDEPENDENT_AMBULATORY_CARE_PROVIDER_SITE_OTHER): Payer: Medicare Other

## 2021-12-03 ENCOUNTER — Telehealth: Payer: Medicare Other

## 2021-12-03 DIAGNOSIS — E1142 Type 2 diabetes mellitus with diabetic polyneuropathy: Secondary | ICD-10-CM

## 2021-12-03 DIAGNOSIS — E7800 Pure hypercholesterolemia, unspecified: Secondary | ICD-10-CM

## 2021-12-03 DIAGNOSIS — I5032 Chronic diastolic (congestive) heart failure: Secondary | ICD-10-CM

## 2021-12-03 NOTE — Progress Notes (Signed)
Chronic Care Management Pharmacy Note  12/07/2021 Name:  CAIUS SILBERNAGEL MRN:  [id number] DOB:  09-12-1950  Summary: Patient presents for CCM follow-up. Today's visit was 100% conducted with patient's daughter Ellison Hughs.   Recommendations/Changes made from today's visit: -Patient candidate for GLP-1 agonist. Discussed benefits of Ozempic at reducing risk of ASCVD with patient's wife. She will discuss at next endocrinology follow-up.  -Patient candidate for CGM given insulin use. Can be dispensed at any pharmacy at no cost. Patient to discuss at endocrinology follow-up.   -Recommend rechecking FLP ~June 2023  Plan: CPP follow-up 3 months  Recommended Problem List Changes:  Add: Hyperlipidemia associated with type 2 diabetes mellitus   Subjective: RUSHIL KIMBRELL is an 71 y.o. year old male who is a primary patient of Ethelene Hal, Mortimer Fries, MD.  The CCM team was consulted for assistance with disease management and care coordination needs.    Engaged with patient by telephone for follow up visit in response to provider referral for pharmacy case management and/or care coordination services.   Consent to Services:  The patient was given information about Chronic Care Management services, agreed to services, and gave verbal consent prior to initiation of services.  Please see initial visit note for detailed documentation.   Patient Care Team: [name], MD as PCP - General (Family Medicine) Josue Hector, MD as PCP - Cardiology (Cardiology) Calvert Cantor, MD as Consulting Physician (Ophthalmology) Germaine Pomfret, [facility] as Pharmacist (Pharmacist)  Recent office visits: 11/16/21: Patient presented to Dr. Ethelene Hal for follow-up.   Recent consult visits: 11/03/21: Patient presented to Ambrose Pancoast, NP (Cardiology).   08/21/21: Patient presented   Hospital visits: 10/21/21: Patient admitted for NSTEMI.    Objective:  Lab Results  Component Value Date   CREATININE  0.86 11/16/2021   BUN 16 11/16/2021   GFR 87.11 11/16/2021   GFRNONAA >60 10/23/2021   GFRAA >60 04/06/2020   NA 140 11/16/2021   K 3.6 11/16/2021   CALCIUM 9.0 11/16/2021   CO2 23 11/16/2021   GLUCOSE 181 (H) 11/16/2021    Lab Results  Component Value Date/Time   HGBA1C 10.4 (H) 10/22/2021 12:56 AM   HGBA1C 10.7 (A) 08/21/2021 08:33 AM   HGBA1C 11.4 (A) 02/13/2021 10:03 AM   HGBA1C 8.4 (H) 03/27/2020 11:15 PM   GFR 87.11 11/16/2021 12:03 PM   GFR 80.70 07/29/2021 12:04 PM   MICROALBUR 40.5 (H) 12/05/2019 11:17 AM   MICROALBUR 37.8 (H) 07/23/2019 08:59 AM    Last diabetic Eye exam:  Lab Results  Component Value Date/Time   HMDIABEYEEXA No Retinopathy 12/17/2019 09:33 AM    Last diabetic Foot exam: No results found for: HMDIABFOOTEX   Lab Results  Component Value Date   CHOL 125 10/22/2021   HDL 44 10/22/2021   LDLCALC 66 10/22/2021   LDLDIRECT 72.0 01/22/2021   TRIG 74 10/22/2021   CHOLHDL 2.8 10/22/2021       Latest Ref Rng & Units 10/22/2021    1:00 AM 10/21/2021    4:01 PM 07/29/2021   12:04 PM  Hepatic Function  Total Protein 6.5 - 8.1 g/dL 6.5   7.2   7.2    Albumin 3.5 - 5.0 g/dL 3.6   4.0   4.3    AST 15 - 41 U/L _0 ALT 0 - 44 U/L _1 Alk Phosphatase 38 - 126 U/L 54  76   62    Total Bilirubin 0.3 - 1.2 mg/dL 0.6   0.6   0.7    Bilirubin, Direct 0.0 - 0.3 mg/dL   0.1      Lab Results  Component Value Date/Time   TSH 2.01 11/16/2021 12:03 PM   TSH 4.528 (H) 10/22/2021 12:56 AM   TSH 1.18 08/04/2016 08:32 AM       Latest Ref Rng & Units 11/16/2021   12:03 PM 10/22/2021    1:00 AM 10/21/2021    4:01 PM  CBC  WBC 4.0 - 10.5 K/uL 6.5   5.3   6.5    Hemoglobin 13.0 - 17.0 g/dL 13.2   12.6   13.3    Hematocrit 39.0 - 52.0 % 38.7   35.5   37.9    Platelets 150.0 - 400.0 K/uL 164.0   160   187      No results found for: VD25OH  Clinical ASCVD: Yes  The ASCVD Risk score (Arnett DK, et al., 2019) failed to calculate for the  following reasons:   The patient has a prior MI or stroke diagnosis       11/16/2021   11:28 AM 11/13/2021    9:39 AM 07/29/2021   11:29 AM  Depression screen PHQ 2/9  Decreased Interest 0 0 0  Down, Depressed, Hopeless 0 0 0  PHQ - 2 Score 0 0 0     Social History   Tobacco Use  Smoking Status Former   Packs/day: 1.50   Years: 25.00   Pack years: 37.50   Types: Cigarettes   Quit date: 07/26/1988   Years since quitting: 33.3  Smokeless Tobacco Never   BP Readings from Last 3 Encounters:  11/16/21 (!) 152/76  11/03/21 132/62  10/23/21 (!) 157/70   Pulse Readings from Last 3 Encounters:  11/16/21 63  11/03/21 69  10/23/21 71   Wt Readings from Last 3 Encounters:  11/16/21 188 lb 6.4 oz (85.5 kg)  11/13/21 190 lb (86.2 kg)  11/03/21 190 lb 3.2 oz (86.3 kg)   BMI Readings from Last 3 Encounters:  11/16/21 25.55 kg/m  11/13/21 25.77 kg/m  11/03/21 25.80 kg/m    Assessment/Interventions: Review of patient past medical history, allergies, medications, health status, including review of consultants reports, laboratory and other test data, was performed as part of comprehensive evaluation and provision of chronic care management services.   SDOH:  (Social Determinants of Health) assessments and interventions performed: Yes  SDOH Screenings   Alcohol Screen: Low Risk    Last Alcohol Screening Score (AUDIT): 0  Depression (PHQ2-9): Low Risk    PHQ-2 Score: 0  Financial Resource Strain: Low Risk    Difficulty of Paying Living Expenses: Not hard at all  Food Insecurity: No Food Insecurity   Worried About Charity fundraiser in the Last Year: Never true   Ran Out of Food in the Last Year: Never true  Housing: Low Risk    Last Housing Risk Score: 0  Physical Activity: Insufficiently Active   Days of Exercise per Week: 2 days   Minutes of Exercise per Session: 20 min  Social Connections: Moderately Integrated   Frequency of Communication with Friends and Family: More  than three times a week   Frequency of Social Gatherings with Friends and Family: More than three times a week   Attends Religious Services: 1 to 4 times per year   Active Member of [employer] or Organizations: Yes  Attends Archivist Meetings: Never   Marital Status: Never married  Stress: No Stress Concern Present   Feeling of Stress : Not at all  Tobacco Use: Medium Risk   Smoking Tobacco Use: Former   Smokeless Tobacco Use: Never   Passive Exposure: Not on file  Transportation Needs: No Transportation Needs   Lack of Transportation (Medical): No   Lack of Transportation (Non-Medical): No    CCM Care Plan  No Known Allergies  Medications Reviewed Today     Reviewed by Germaine Pomfret, [facility] (Pharmacist) on 12/03/21 at [place] List Status: <None>   Medication Order Taking? Sig Documenting Provider Last Dose Status Informant  amLODipine (NORVASC) 5 MG tablet [id number]  TAKE 1 TABLET(5 MG) BY MOUTH DAILY  Patient taking differently: Take 5 mg by mouth daily.   [name], MD  Active Self  aspirin EC 81 MG tablet [id number]  Take 81 mg by mouth daily. Swallow whole. [provider]  Active Self  atorvastatin (LIPITOR) 80 MG tablet [id number]  Take 1 tablet (80 mg total) by mouth daily. Kayleen Memos, DO  Active   dapagliflozin propanediol (FARXIGA) 10 MG TABS tablet [id number] Yes Take 1 tablet (10 mg total) by mouth daily. Loel Dubonnet, NP Taking Active   ezetimibe (ZETIA) 10 MG tablet [id number]  Take 1 tablet (10 mg total) by mouth daily. Irene Pap N, DO  Active   glucose blood test strip [id number]  1 each by Other route 2 (two) times daily. And lancets 2/day Renato Shin, MD  Active Self  insulin glargine (LANTUS SOLOSTAR) 100 UNIT/ML Solostar Pen [id number]  Inject 25 Units into the skin at bedtime. And pen needles 1/day  Patient taking differently: Inject 30 Units into the skin at bedtime. And pen needles 1/day   Kayleen Memos, DO  Active    Iron, Ferrous Sulfate, 325 (65 Fe) MG TABS [id number]  Take one daily  Patient taking differently: Take 325 mg by mouth daily. Take one daily   [name], MD  Active Self  magnesium oxide (MAG-OX) 400 (240 Mg) MG tablet [id number]  Take 1 tablet by mouth daily. [provider]  Active   metFORMIN (GLUMETZA) 1000 MG (MOD) 24 hr tablet [id number] Yes Take 1 tablet (1,000 mg total) by mouth daily. Kayleen Memos, DO Taking Active   metoprolol succinate (TOPROL-XL) 25 MG 24 hr tablet [id number]  Take 0.5 tablets (12.5 mg total) by mouth daily. Kayleen Memos, DO  Active   nitroGLYCERIN (NITROSTAT) 0.4 MG SL tablet [id number]  Place 1 tablet (0.4 mg total) under the tongue every 5 (five) minutes as needed for chest pain. Loel Dubonnet, NP  Active   Study - SOS-AMI - placebo for selatogrel 0 mg/0.5 mL SQ injection for screening use only ([facility]) [id number]   Buford Dresser, MD  Active   Study - SOS-AMI - selatogrel 16 mg/0.5 mL or placebo SQ injection ([facility]) [id number]  Inject 16 mg into the skin once. For Investigational Use Only. Inject 0.5 mL subcutaneously into the abdomen or thigh if experiencing symptoms of a heart attack. Call 911 immediately and seek emergency medical support. Please contact [place] with any questions or concerns regarding this medication. Buford Dresser, MD  Active Pharmacy Records  Study - SOS-AMI - selatogrel 16 mg/0.5 mL or placebo SQ injection ([facility]) [id number]  Inject 0.5 mLs (16 mg total) into the skin once for 1 dose. For Investigational Use Only. Inject  0.5 mL subcutaneously into the abdomen or thigh if experiencing symptoms of a heart attack. Call 911 immediately and seek emergency medical support. Buford Dresser, MD  Expired 11/02/21 2359   ticagrelor (BRILINTA) 90 MG TABS tablet [id number]  Take 1 tablet (90 mg total) by mouth 2 (two) times daily. Kayleen Memos, DO  Active   vitamin  B-12 (CYANOCOBALAMIN) 1000 MCG tablet [id number]  Take 1 tablet (1,000 mcg total) by mouth daily. [name], MD  Active Self            Patient Active Problem List   Diagnosis Date Noted   Light-headedness 10/22/2021   Chronic diastolic CHF (congestive heart failure) ([place]) 10/22/2021   NSTEMI (non-ST elevated myocardial infarction) ([place]) 10/21/2021   Right upper quadrant abdominal pain 07/29/2021   Pre-op evaluation 02/09/2021   Elevated uric acid in blood 10/22/2020   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   Iron deficiency 04/18/2020   B12 deficiency 04/18/2020   Anemia 04/17/2020   Urinary retention 04/17/2020   Balanitis 04/04/2020   Hospital discharge follow-up 04/04/2020   Prostatitis 03/28/2020   Hypotension due to hypovolemia 03/27/2020   Hyperkalemia 03/27/2020   Acute kidney injury ([place]) 03/27/2020   Right knee pain 03/07/2020   Acute idiopathic gout of right knee 02/25/2020   Bilateral carotid bruits 12/05/2019   Type 2 diabetes mellitus with diabetic polyneuropathy, with long-term current use of insulin ([place]) 09/05/2019   Diabetes mellitus ([place]) 09/05/2019   Cervical radiculopathy 02/12/2019   Constipation 02/12/2019   DOE (dyspnea on exertion) 02/12/2019   Chest wall pain 02/12/2019   No-show for appointment 11/21/2018   Coronary artery disease involving native heart 07/20/2018   Bronchitis 07/20/2018   Abnormal stress test    Elevated LDL cholesterol level 06/14/2018   Swelling of left foot 08/05/2017   Nonintractable episodic headache 12/01/2016   Cough 08/04/2016   Right lower quadrant abdominal pain 08/04/2016   Abnormal PSA 05/19/2015   OA (osteoarthritis) of knee 05/16/2015   Foot ulcer, right ([place]) 04/15/2014   Loss of vision 04/15/2014   Screening for prostate cancer 11/03/2012   Routine general medical examination at a health care facility 11/03/2012   Arthralgia 11/12/2010   SHOULDER PAIN, BILATERAL  01/12/2010   TUBULOVILLOUS ADENOMA, 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   HYPERCHOLESTEROLEMIA 07/29/2008   CELLULITIS, FOOT, RIGHT 07/02/2008   EDEMA 07/02/2008   Type 2 diabetes mellitus with hyperglycemia, with long-term current use of insulin ([place]) 03/04/2007   Essential hypertension 03/04/2007    Immunization History  Administered Date(s) Administered   Fluad Quad(high Dose 65+) 05/31/2019   Influenza Split 05/17/2011   Influenza Whole 05/15/2010   Influenza, High Dose Seasonal PF 06/14/2018   Influenza,inj,Quad PF,6+ Mos 06/12/2013   Pneumococcal Conjugate-13 08/04/2016   Pneumococcal Polysaccharide-23 05/16/2015, 03/28/2020   Td 11/23/1997, 07/02/2008   Tdap 09/02/2016    Conditions to be addressed/monitored:  Hypertension, Hyperlipidemia, Diabetes, and History of MI  Care Plan : General Pharmacy (Adult)  Updates made by Germaine Pomfret, RPH since 12/07/2021 12:00 AM     Problem: Hypertension, Hyperlipidemia, Diabetes, and History of MI   Priority: High     Long-Range Goal: Patient-Specific Goal   Start Date: 12/03/2021  Expected End Date: 12/04/2022  This Visit's Progress: On track  Priority: High  Note:   Current Barriers:  Unable to achieve control of diabetes   Pharmacist Clinical Goal(s):  Patient will achieve control of Diabetes as evidenced by A1c less than 8% through collaboration with  PharmD and provider.   Interventions: 1:1 collaboration with [name], MD regarding development and update of comprehensive plan of care as evidenced by provider attestation and co-signature Inter-disciplinary care team collaboration (see longitudinal plan of care) Comprehensive medication review performed; medication list updated in electronic medical record  Heart Failure (Goal: manage symptoms and prevent exacerbations) -Controlled -Last ejection fraction: 55-60% (Date: Mar 2023) -HF type: Diastolic -NYHA Class: I (no actitivty limitation) -Current  treatment: Amlodipine 5 mg daily Metoprolol XL 25 mg 1/2 tablet daily   -Medications previously tried: NA  -Current home BP/HR readings: 132-145/65-75  -Endorses swelling in legs, going on since Saturday  -Recommended to continue current medication   Hyperlipidemia: (LDL goal < 55) -Uncontrolled -NSTEMI Mar 2023 -Current treatment: Atorvastatin 80 mg daily  Ezetimibe 10 mg daily  -Current antiplatelet treatment: Aspirin 81 mg daily  Ticagrelor 90 mg twice daily  -Medications previously tried: NA  -Would recommend more aggressive LDL goal given recent NSTEMI.  -Recommended to continue current medication -Recommend rechecking FLP ~June 2023  Diabetes (A1c goal <8%) -Uncontrolled -Current medications: Farxiga 10 mg daily  Lantus 25 units daily  Metformin 1000 mg daily  -Medications previously tried: NA  -Current home glucose readings fasting glucose: 85-150s post prandial glucose: 140-150s -Denies hypoglycemic/hyperglycemic symptoms -Patient candidate for GLP-1 agonist. Discussed benefits of Ozempic at reducing risk of ASCVD with patient's wife. She will discuss at next endocrinology follow-up.  -Patient candidate for CGM given insulin use. Can be dispensed at any pharmacy at no cost. Patient to discuss at endocrinology follow-up.  -Recommended to continue current medication.  Patient Goals/Self-Care Activities Patient will:  - check glucose twice daily, before breakfast and before supper, document, and provide at future appointments check blood pressure 2-3 times weekly, document, and provide at future appointments  Follow Up Plan: Telephone follow up appointment with care management team member scheduled for:  03/04/2022 at 3:45 PM      Medication Assistance:  Eric Form obtained through [place].  Enrollment ends NA .  Compliance/Adherence/Medication fill history: Care Gaps: Urine Microalbumin  Star-Rating Drugs: Atorvastatin 80  mg: Last filled 11/09/21 for 90-DS  Patient's preferred pharmacy is:  [facility] [place], [address] [place] [address] Phone: [phone] Fax: [phone]  Uses pill box? Yes Pt endorses 100% compliance  We discussed: Current pharmacy is preferred with insurance plan and patient is satisfied with pharmacy services Patient decided to: Continue current medication management strategy  Care Plan and Follow Up Patient Decision:  Patient agrees to Care Plan and Follow-up.  Plan: Telephone follow up appointment with care management team member scheduled for:  03/04/2022 at 3:45 PM  [name], PharmD, [place], CPP Clinical Pharmacist Practitioner  [facility] at [facility]  [phone]

## 2021-12-07 NOTE — Patient Instructions (Signed)
Visit Information ?It was great speaking with you today!  Please let me know if you have any questions about our visit. ? ? Goals Addressed   ? ?  ?  ?  ?  ? This Visit's Progress  ?  Monitor and Manage My Blood Sugar-Diabetes Type 2   On track  ?  Timeframe:  Long-Range Goal ?Priority:  High ?Start Date: 12/03/21                            ?Expected End Date: 12/04/22                     ? ?Follow Up within 90 days  ?  ?- check blood sugar at prescribed times ?- check blood sugar before and after exercise ?- check blood sugar if I feel it is too high or too low ?- enter blood sugar readings and medication or insulin into daily log  ?  ?Why is this important?   ?Checking your blood sugar at home helps to keep it from getting very high or very low.  ?Writing the results in a diary or log helps the doctor know how to care for you.  ?Your blood sugar log should have the time, date and the results.  ?Also, write down the amount of insulin or other medicine that you take.  ?Other information, like what you ate, exercise done and how you were feeling, will also be helpful.   ?  ?Notes:  ?  ? ?  ? ? ?Patient Care Plan: General Pharmacy (Adult)  ?  ? ?Problem Identified: Hypertension, Hyperlipidemia, Diabetes, and History of MI   ?Priority: High  ?  ? ?Long-Range Goal: Patient-Specific Goal   ?Start Date: 12/03/2021  ?Expected End Date: 12/04/2022  ?This Visit's Progress: On track  ?Priority: High  ?Note:   ?Current Barriers:  ?Unable to achieve control of diabetes  ? ?Pharmacist Clinical Goal(s):  ?Patient will achieve control of Diabetes as evidenced by A1c less than 8% through collaboration with PharmD and provider.  ? ?Interventions: ?1:1 collaboration with [name], MD regarding development and update of comprehensive plan of care as evidenced by provider attestation and co-signature ?Inter-disciplinary care team collaboration (see longitudinal plan of care) ?Comprehensive medication review performed;  medication list updated in electronic medical record ? ?Heart Failure (Goal: manage symptoms and prevent exacerbations) ?-Controlled ?-Last ejection fraction: 55-60% (Date: Mar 2023) ?-HF type: Diastolic ?-NYHA Class: I (no actitivty limitation) ?-Current treatment: ?Amlodipine 5 mg daily ?Metoprolol XL 25 mg 1/2 tablet daily   ?-Medications previously tried: NA  ?-Current home BP/HR readings: 132-145/65-75  ?-Endorses swelling in legs, going on since Saturday  ?-Recommended to continue current medication ? ? ?Hyperlipidemia: (LDL goal < 55) ?-Uncontrolled ?-NSTEMI Mar 2023 ?-Current treatment: ?Atorvastatin 80 mg daily  ?Ezetimibe 10 mg daily  ?-Current antiplatelet treatment: ?Aspirin 81 mg daily  ?Ticagrelor 90 mg twice daily  ?-Medications previously tried: NA  ?-Would recommend more aggressive LDL goal given recent NSTEMI.  ?-Recommended to continue current medication ?-Recommend rechecking FLP ~June 2023 ? ?Diabetes (A1c goal <8%) ?-Uncontrolled ?-Current medications: ?Farxiga 10 mg daily  ?Lantus 25 units daily  ?Metformin 1000 mg daily  ?-Medications previously tried: NA  ?-Current home glucose readings ?fasting glucose: 85-150s ?post prandial glucose: 140-150s ?-Denies hypoglycemic/hyperglycemic symptoms ?-Patient candidate for GLP-1 agonist. Discussed benefits of Ozempic at reducing risk of ASCVD with patient's wife. She will discuss at next endocrinology  follow-up.  ?-Patient candidate for CGM given insulin use. Can be dispensed at any pharmacy at no cost. Patient to discuss at endocrinology follow-up.  ?-Recommended to continue current medication. ? ?Patient Goals/Self-Care Activities ?Patient will:  ?- check glucose twice daily, before breakfast and before supper, document, and provide at future appointments ?check blood pressure 2-3 times weekly, document, and provide at future appointments ? ?Follow Up Plan: Telephone follow up appointment with care management team member scheduled for:  03/04/2022 at  3:45 PM ?  ? ?Patient agreed to services and verbal consent obtained.  ? ?Patient verbalizes understanding of instructions and care plan provided today and agrees to view in [place]. Active MyChart status confirmed with patient.   ? ?[name], PharmD, BCACP, CPP ?Clinical Pharmacist Practitioner  ?[facility] at [facility]  ?[phone]  ?

## 2021-12-09 ENCOUNTER — Encounter: Payer: Self-pay | Admitting: *Deleted

## 2021-12-09 DIAGNOSIS — Z006 Encounter for examination for normal comparison and control in clinical research program: Secondary | ICD-10-CM

## 2021-12-09 NOTE — Research (Signed)
[id number], [id number]     FOLLOW-UP PHONE CALLS  SUBJECT ID:  [id number] Trainer's name: Shirley Muscat, RN Trainer's signature: on Delegation of Authority Log Date of the Follow up call:  09-Dec-2021  Visit Number: 3 Start time of the Follow up call: 1200     End time of call: 1205   - CONTACT   Q1;  Was the follow up phone call done with the subject?  '[x]'$   YES  '[]'$   NO           If NO, check the following:      Q2:    Was the follow up phone call done with a family member               Or caregiver?    '[]'$   YES   '[x]'$   NO          Make note about reasons ___________________________________    AUTOINJECTOR LABEL:  Still legible?  '[x]'$   Yes  '[]'$   No  - MEDICAL CONDITION      Q3:  Did any of the following occur?   '[]'$   Death       '[]'$   Hospitalization (any cause)     NONE    '[]'$   Use of autoinjector  Make note about the type of event, and when it occurred. In case of hospitalization: the Location of the hospital and/or the treating physician's contact details  ____________ ____________________________________________________________________ ____________________________________________________________________  Note: remember to report any SAE/AE which has occurred within 30 days after any  injection of the study drug on relevant eCRF forms.   Q4:  Did the subject develop any condition which is an          exclusion criterion?  '[]'$   YES  '[x]'$   NO   Take note about the occurrence of any exclusion criterion after randomization: _________________________________________________________________ __________________________________________________________________  Q5: Was there any change in subject's antithrombotic therapy?   '[]'$   YES  '[x]'$   NO     Take not about any change of antithrombotic treatment: _______________________ ____________________________________________________________________ ____________________________________________________________________       Lezlie Octave OF THE STUDY-SPECFIC TRAINING  Q6: Did the subject correctly reply to the following questions?       A.  What are common heart attack symptoms?      '[x]'$   YES   '[]'$   NO      B.  What has to be done in case any of those symptoms occurs? '[x]'$   YES  '[]'$   NO      C.  What are the main steps to perform a self-injection?  '[x]'$   YES  '[]'$   NO             If NO, then report which step/s was/were missing:        '[]'$   Choose injection site (abdomen or thigh)       '[]'$   Twist cap off       '[]'$   Pinch skin and place the study autoinjector       '[]'$   Firmly push down and hold for 3 seconds       D. What has to be done immediately after an injection?  '[x]'$   YES   '[]'$   NO          If NO, then report which step/s was/were missing:       '[]'$   Call  for emergency medical help       '[]'$   Show the  autoinjector to the emergency medical responder       E.  Does the subject recall where s/he keeps/ stores the autoinjectors? '[x]'$  YES  '[]'$  NO          Note the place of storage and any corrective explanation if needed below __________________________________________________________________ __________________________________________________________________  - TRAINING REFRESHER   Q7:  Is a training refresher needed?      '[x]'$  YES  '[]'$  NO        If YES, indicate items that have to be refreshed. More than one may apply:               '[]'$   Heart attack symptoms             '[]'$   Actions to be taken following heart attack symptoms             '[]'$   Steps to perform the self-injection and follow-up actions to be taken   '[]'$   Other, Specify  _________________________________________     Form Based on [facility]_CRF_Version 5.0_27Oct2021 [id number], 62,            and eCRF  Version 5.0-  21-May-2020   Current Outpatient Medications:    amLODipine (NORVASC) 5 MG tablet, TAKE 1 TABLET(5 MG) BY MOUTH DAILY (Patient taking differently: Take 5 mg by mouth daily.), Disp: 90 tablet, Rfl: 0   aspirin EC 81 MG tablet,  Take 81 mg by mouth daily. Swallow whole., Disp: , Rfl:    atorvastatin (LIPITOR) 80 MG tablet, Take 1 tablet (80 mg total) by mouth daily., Disp: 90 tablet, Rfl: 0   dapagliflozin propanediol (FARXIGA) 10 MG TABS tablet, Take 1 tablet (10 mg total) by mouth daily., Disp: 30 tablet, Rfl: 0   ezetimibe (ZETIA) 10 MG tablet, Take 1 tablet (10 mg total) by mouth daily., Disp: 90 tablet, Rfl: 0   glucose blood test strip, 1 each by Other route 2 (two) times daily. And lancets 2/day, Disp: 200 each, Rfl: 3   insulin glargine (LANTUS SOLOSTAR) 100 UNIT/ML Solostar Pen, Inject 25 Units into the skin at bedtime. And pen needles 1/day (Patient taking differently: Inject 30 Units into the skin at bedtime. And pen needles 1/day), Disp: 30 mL, Rfl: 0   Iron, Ferrous Sulfate, 325 (65 Fe) MG TABS, Take one daily (Patient taking differently: Take 325 mg by mouth daily. Take one daily), Disp: 90 tablet, Rfl: 4   magnesium oxide (MAG-OX) 400 (240 Mg) MG tablet, Take 1 tablet by mouth daily., Disp: , Rfl:    metFORMIN (GLUMETZA) 1000 MG (MOD) 24 hr tablet, Take 1 tablet (1,000 mg total) by mouth daily., Disp: 30 tablet, Rfl: 0   metoprolol succinate (TOPROL-XL) 25 MG 24 hr tablet, Take 0.5 tablets (12.5 mg total) by mouth daily., Disp: 45 tablet, Rfl: 0   nitroGLYCERIN (NITROSTAT) 0.4 MG SL tablet, Place 1 tablet (0.4 mg total) under the tongue every 5 (five) minutes as needed for chest pain., Disp: 25 tablet, Rfl: 3   Study - [facility] - selatogrel 16 mg/0.5 mL or placebo SQ injection (PI-Christopher), Inject 16 mg into the skin once. For Investigational Use Only. Inject 0.5 mL subcutaneously into the abdomen or thigh if experiencing symptoms of a heart attack. Call 911 immediately and seek emergency medical support. Please contact [facility] with any questions or concerns regarding this medication., Disp: , Rfl:    Study - [facility] - selatogrel 16 mg/0.5 mL or placebo SQ injection (PI-Christopher), Inject  0.5 mLs (16  mg total) into the skin once for 1 dose. For Investigational Use Only. Inject 0.5 mL subcutaneously into the abdomen or thigh if experiencing symptoms of a heart attack. Call 911 immediately and seek emergency medical support., Disp: 1 mL, Rfl: 0   ticagrelor (BRILINTA) 90 MG TABS tablet, Take 1 tablet (90 mg total) by mouth 2 (two) times daily., Disp: 720 tablet, Rfl: 0   vitamin B-12 (CYANOCOBALAMIN) 1000 MCG tablet, Take 1 tablet (1,000 mcg total) by mouth daily., Disp: 90 tablet, Rfl: 1  Current Facility-Administered Medications:    Study - [facility] - placebo for selatogrel 0 mg/0.5 mL SQ injection for screening use only (PI-Christopher), 0.5 mL, Subcutaneous, Once, Buford Dresser, MD

## 2021-12-23 DIAGNOSIS — Z87891 Personal history of nicotine dependence: Secondary | ICD-10-CM

## 2021-12-23 DIAGNOSIS — Z794 Long term (current) use of insulin: Secondary | ICD-10-CM

## 2021-12-23 DIAGNOSIS — I5030 Unspecified diastolic (congestive) heart failure: Secondary | ICD-10-CM

## 2021-12-23 DIAGNOSIS — E785 Hyperlipidemia, unspecified: Secondary | ICD-10-CM

## 2021-12-23 DIAGNOSIS — E1159 Type 2 diabetes mellitus with other circulatory complications: Secondary | ICD-10-CM

## 2021-12-23 DIAGNOSIS — Z7984 Long term (current) use of oral hypoglycemic drugs: Secondary | ICD-10-CM

## 2021-12-25 LAB — HM DIABETES EYE EXAM

## 2021-12-28 ENCOUNTER — Telehealth (HOSPITAL_COMMUNITY): Payer: Self-pay | Admitting: *Deleted

## 2021-12-28 NOTE — Telephone Encounter (Signed)
Completed health history with daughter over the phone. Confirmed appointment for tomorrow.Harrell Gave RN BSN

## 2021-12-29 ENCOUNTER — Encounter: Payer: Self-pay | Admitting: Family Medicine

## 2021-12-29 ENCOUNTER — Encounter (HOSPITAL_COMMUNITY)
Admission: RE | Admit: 2021-12-29 | Discharge: 2021-12-29 | Disposition: A | Discharge status: Home / Self Care | Payer: Medicare Other | Source: Ambulatory Visit | Attending: Cardiovascular Disease | Admitting: Cardiovascular Disease

## 2021-12-29 ENCOUNTER — Encounter (HOSPITAL_COMMUNITY): Payer: Self-pay

## 2021-12-29 VITALS — BP 124/78 | HR 73 | Ht 70.75 in | Wt 185.2 lb

## 2021-12-29 DIAGNOSIS — I214 Non-ST elevation (NSTEMI) myocardial infarction: Secondary | ICD-10-CM | POA: Insufficient documentation

## 2021-12-29 DIAGNOSIS — Z955 Presence of coronary angioplasty implant and graft: Secondary | ICD-10-CM | POA: Diagnosis present

## 2021-12-29 NOTE — Progress Notes (Addendum)
Cardiac Rehab Medication Review by a Nurse  Does the patient  feel that his/her medications are working for him/her?  yes  Has the patient been experiencing any side effects to the medications prescribed?  no  Does the patient measure his/her own blood pressure or blood glucose at home?  yes   Does the patient have any problems obtaining medications due to transportation or finances?   no  Understanding of regimen: poor Understanding of indications: poor Potential of compliance: excellent    Nurse comments: Scott Donaldson is taking his medications as prescribed. Patient's daughter Scott Donaldson helps the patient by preparing and making sure he takes them as prescribed. Scott Donaldson does not understand the indications of his medications per his daughter.Patient's daughter takes her father's CBG and blood pressures daily.    Scott Donaldson [facility] RN 12/29/2021 9:09 AM

## 2021-12-29 NOTE — Progress Notes (Signed)
Intermittent questionable PAC's noted during 6 minute walk test. Patient asymptomatic.Will fax exercise flow sheets to Dr. Kyla Balzarine office for review.Harrell Gave RN BSN

## 2021-12-29 NOTE — Progress Notes (Signed)
Cardiac Individual Treatment Plan  Patient Details  Name: Scott Donaldson MRN: [id number] Date of Birth: Feb 08, 1951 Referring Provider:   Flowsheet Row INTENSIVE CARDIAC REHAB ORIENT from 12/29/2021 in [place]  Referring Provider Josue Hector, MD       Initial Encounter Date:  [place] from 12/29/2021 in [place]  Date 12/29/21       Visit Diagnosis: 10/19/21 NSTEMI  10/22/21 DES CX  Patient's Home Medications on Admission:  Current Outpatient Medications:    amLODipine (NORVASC) 5 MG tablet, TAKE 1 TABLET(5 MG) BY MOUTH DAILY (Patient taking differently: Take 5 mg by mouth daily.), Disp: 90 tablet, Rfl: 0   aspirin EC 81 MG tablet, Take 81 mg by mouth daily. Swallow whole., Disp: , Rfl:    atorvastatin (LIPITOR) 80 MG tablet, Take 1 tablet (80 mg total) by mouth daily., Disp: 90 tablet, Rfl: 0   dapagliflozin propanediol (FARXIGA) 10 MG TABS tablet, Take 1 tablet (10 mg total) by mouth daily., Disp: 30 tablet, Rfl: 0   ezetimibe (ZETIA) 10 MG tablet, Take 1 tablet (10 mg total) by mouth daily., Disp: 90 tablet, Rfl: 0   glucose blood test strip, 1 each by Other route 2 (two) times daily. And lancets 2/day, Disp: 200 each, Rfl: 3   insulin glargine (LANTUS SOLOSTAR) 100 UNIT/ML Solostar Pen, Inject 25 Units into the skin at bedtime. And pen needles 1/day (Patient taking differently: Inject 30 Units into the skin at bedtime. And pen needles 1/day), Disp: 30 mL, Rfl: 0   Iron, Ferrous Sulfate, 325 (65 Fe) MG TABS, Take one daily (Patient taking differently: Take 325 mg by mouth daily. Take one daily), Disp: 90 tablet, Rfl: 4   magnesium oxide (MAG-OX) 400 (240 Mg) MG tablet, Take 1 tablet by mouth daily., Disp: , Rfl:    metFORMIN (GLUMETZA) 1000 MG (MOD) 24 hr tablet, Take 1 tablet (1,000 mg total) by mouth daily., Disp: 30 tablet, Rfl: 0   metoprolol succinate (TOPROL-XL) 25 MG  24 hr tablet, Take 0.5 tablets (12.5 mg total) by mouth daily., Disp: 45 tablet, Rfl: 0   nitroGLYCERIN (NITROSTAT) 0.4 MG SL tablet, Place 1 tablet (0.4 mg total) under the tongue every 5 (five) minutes as needed for chest pain., Disp: 25 tablet, Rfl: 3   Study - SOS-AMI - selatogrel 16 mg/0.5 mL or placebo SQ injection ([facility]), Inject 16 mg into the skin once. For Investigational Use Only. Inject 0.5 mL subcutaneously into the abdomen or thigh if experiencing symptoms of a heart attack. Call 911 immediately and seek emergency medical support. Please contact [facility] with any questions or concerns regarding this medication., Disp: , Rfl:    Study - SOS-AMI - selatogrel 16 mg/0.5 mL or placebo SQ injection (PI-Christopher), Inject 0.5 mLs (16 mg total) into the skin once for 1 dose. For Investigational Use Only. Inject 0.5 mL subcutaneously into the abdomen or thigh if experiencing symptoms of a heart attack. Call 911 immediately and seek emergency medical support., Disp: 1 mL, Rfl: 0   ticagrelor (BRILINTA) 90 MG TABS tablet, Take 1 tablet (90 mg total) by mouth 2 (two) times daily., Disp: 720 tablet, Rfl: 0   vitamin B-12 (CYANOCOBALAMIN) 1000 MCG tablet, Take 1 tablet (1,000 mcg total) by mouth daily., Disp: 90 tablet, Rfl: 1  Current Facility-Administered Medications:    Study - SOS-AMI - placebo for selatogrel 0 mg/0.5 mL SQ injection for screening  use only (PI-Christopher), 0.5 mL, Subcutaneous, Once, Buford Dresser, MD  Past Medical History: Past Medical History:  Diagnosis Date   Abdominal pain 08/04/2016   Abnormal PSA 05/19/2015   Anemia    Arthralgia 11/12/2010   Benign prostatic hyperplasia with urinary hesitancy 06/14/2018   Cataract    Coronary artery disease    nonobstructive   Diabetes ([place]) 03/04/2007   Qualifier: Diagnosis of  By: Loanne Drilling MD, Sean A    DIABETES MELLITUS, TYPE II 03/04/2007   Elevated LDL cholesterol level 06/14/2018   Foot  ulcer, right ([place]) 04/15/2014   GERD (gastroesophageal reflux disease)    HOH (hard of hearing)    both ears no hearing aides   HYPERCHOLESTEROLEMIA 07/29/2008   HYPERTENSION 03/04/2007   Knee pain, left 05/16/2015   Nonintractable episodic headache 12/01/2016   migraine   SHOULDER PAIN, BILATERAL 01/12/2010   Qualifier: Diagnosis of  By: Loanne Drilling MD, Sean A    TUBULOVILLOUS ADENOMA, 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   Qualifier: Diagnosis of  By: Loanne Drilling MD, Sean A     Tobacco Use: Social History   Tobacco Use  Smoking Status Former   Packs/day: 1.50   Years: 25.00   Pack years: 37.50   Types: Cigarettes   Quit date: 07/26/1988   Years since quitting: 33.4  Smokeless Tobacco Never    Labs: Review Flowsheet        Latest Ref Rng & Units 01/22/2021 02/13/2021 02/27/2021 08/21/2021  Labs for ITP Cardiac and Pulmonary Rehab  Cholestrol 0 - 200 mg/dL 175    166     LDL (calc) 0 - 99 mg/dL   93     Direct LDL mg/dL 72.0       HDL-C >40 mg/dL 33.70    33     Trlycerides <150 mg/dL 358.0    233     Hemoglobin A1c 4.8 - 5.6 %  11.4    10.7       10/22/2021  Labs for ITP Cardiac and Pulmonary Rehab  Cholestrol 125    LDL (calc) 66    Direct LDL   HDL-C 44    Trlycerides 74    Hemoglobin A1c 10.4            Capillary Blood Glucose: Lab Results  Component Value Date   GLUCAP 305 (H) 10/23/2021   GLUCAP 161 (H) 10/23/2021   GLUCAP 166 (H) 10/22/2021   GLUCAP 85 10/22/2021   GLUCAP 130 (H) 10/22/2021     Exercise Target Goals: Exercise Program Goal: Individual exercise prescription set using results from initial 6 min walk test and THRR while considering  patient's activity barriers and safety.   Exercise Prescription Goal: Initial exercise prescription builds to 30-45 minutes a day of aerobic activity, 2-3 days per week.  Home exercise guidelines will be given to patient during program as part of exercise prescription that the participant will acknowledge.  Activity Barriers & Risk  Stratification:  Activity Barriers & Cardiac Risk Stratification - 12/29/21 0952       Activity Barriers & Cardiac Risk Stratification   Activity Barriers Back Problems;Balance Concerns;Other (comment)    Comments Per patient, he needs right knee replacement, possible left as well.    Cardiac Risk Stratification High             6 Minute Walk:  6 Minute Walk     Row Name 12/29/21 0918         6 Minute Walk   Phase Initial  Distance 1320 feet     Walk Time 6 minutes     # of Rest Breaks 0     MPH 2.5     METS 3.39     RPE 9     Perceived Dyspnea  0     VO2 Peak 11.86     Symptoms Yes (comment)     Comments Patient c/o numbness in both legs from knees to toes, chronic.     Resting HR 73 bpm     Resting BP 124/78     Resting Oxygen Saturation  99 %     Exercise Oxygen Saturation  during 6 min walk 98 %     Max Ex. HR 110 bpm     Max Ex. BP 162/60     2 Minute Post BP 138/62              Oxygen Initial Assessment:   Oxygen Re-Evaluation:   Oxygen Discharge (Final Oxygen Re-Evaluation):   Initial Exercise Prescription:  Initial Exercise Prescription - 12/29/21 0900       Date of Initial Exercise RX and Referring Provider   Date 12/29/21    Referring Provider Josue Hector, MD    Expected Discharge Date 02/26/22      Recumbant Bike   Level 1.5    Watts 25    Minutes 15    METs 2.3      NuStep   Level 2    SPM 85    Minutes 15    METs 2.3      Prescription Details   Frequency (times per week) 3    Duration Progress to 30 minutes of continuous aerobic without signs/symptoms of physical distress      Intensity   THRR 40-80% of Max Heartrate 60-119    Ratings of Perceived Exertion 11-13    Perceived Dyspnea 0-4      Progression   Progression Continue to progress workloads to maintain intensity without signs/symptoms of physical distress.      Resistance Training   Training Prescription Yes    Weight 2 lbs    Reps 10-15              Perform Capillary Blood Glucose checks as needed.  Exercise Prescription Changes:   Exercise Comments:   Exercise Goals and Review:   Exercise Goals     Row Name 12/29/21 0902             Exercise Goals   Increase Physical Activity Yes       Intervention Provide advice, education, support and counseling about physical activity/exercise needs.;Develop an individualized exercise prescription for aerobic and resistive training based on initial evaluation findings, risk stratification, comorbidities and participant's personal goals.       Expected Outcomes Short Term: Attend rehab on a regular basis to increase amount of physical activity.;Long Term: Exercising regularly at least 3-5 days a week.;Long Term: Add in home exercise to make exercise part of routine and to increase amount of physical activity.       Increase Strength and Stamina Yes       Intervention Develop an individualized exercise prescription for aerobic and resistive training based on initial evaluation findings, risk stratification, comorbidities and participant's personal goals.;Provide advice, education, support and counseling about physical activity/exercise needs.       Expected Outcomes Short Term: Increase workloads from initial exercise prescription for resistance, speed, and METs.;Short Term: Perform resistance training exercises routinely during rehab and add  in resistance training at home;Long Term: Improve cardiorespiratory fitness, muscular endurance and strength as measured by increased METs and functional capacity (6MWT)       Able to understand and use rate of perceived exertion (RPE) scale Yes       Intervention Provide education and explanation on how to use RPE scale       Expected Outcomes Short Term: Able to use RPE daily in rehab to express subjective intensity level;Long Term:  Able to use RPE to guide intensity level when exercising independently       Knowledge and understanding of Target  Heart Rate Range (THRR) Yes       Intervention Provide education and explanation of THRR including how the numbers were predicted and where they are located for reference       Expected Outcomes Short Term: Able to state/look up THRR;Long Term: Able to use THRR to govern intensity when exercising independently;Short Term: Able to use daily as guideline for intensity in rehab       Understanding of Exercise Prescription Yes       Intervention Provide education, explanation, and written materials on patient's individual exercise prescription       Expected Outcomes Short Term: Able to explain program exercise prescription;Long Term: Able to explain home exercise prescription to exercise independently                Exercise Goals Re-Evaluation :   Discharge Exercise Prescription (Final Exercise Prescription Changes):   Nutrition:  Target Goals: Understanding of nutrition guidelines, daily intake of sodium '1500mg'$ , cholesterol '200mg'$ , calories 30% from fat and 7% or less from saturated fats, daily to have 5 or more servings of fruits and vegetables.  Biometrics:  Pre Biometrics - 12/29/21 0835       Pre Biometrics   Waist Circumference 39.75 inches    Hip Circumference 39.5 inches    Waist to Hip Ratio 1.01 %    Triceps Skinfold 14 mm    % Body Fat 26.4 %    Grip Strength 24 kg    Flexibility 14.13 in    Single Leg Stand 3.75 seconds              Nutrition Therapy Plan and Nutrition Goals:   Nutrition Assessments:  MEDIFICTS Score Key: ?70 Need to make dietary changes  40-70 Heart Healthy Diet ? 40 Therapeutic Level Cholesterol Diet    Picture Your Plate Scores: <40 Unhealthy dietary pattern with much room for improvement. 41-50 Dietary pattern unlikely to meet recommendations for good health and room for improvement. 51-60 More healthful dietary pattern, with some room for improvement.  >60 Healthy dietary pattern, although there may be some specific behaviors  that could be improved.    Nutrition Goals Re-Evaluation:   Nutrition Goals Re-Evaluation:   Nutrition Goals Discharge (Final Nutrition Goals Re-Evaluation):   Psychosocial: Target Goals: Acknowledge presence or absence of significant depression and/or stress, maximize coping skills, provide positive support system. Participant is able to verbalize types and ability to use techniques and skills needed for reducing stress and depression.  Initial Review & Psychosocial Screening:  Initial Psych Review & Screening - 12/29/21 1223       Initial Review   Current issues with None Identified      Family Dynamics   Good Support System? Yes   Caden lives with his daughter Ellison Hughs who he has for support.     Barriers   Psychosocial barriers to participate in program There are  no identifiable barriers or psychosocial needs.      Screening Interventions   Interventions Encouraged to exercise             Quality of Life Scores:  Quality of Life - 12/29/21 1012       Quality of Life   Select Quality of Life      Quality of Life Scores   Health/Function Pre 20.54 %    Socioeconomic Pre 20.64 %    Psych/Spiritual Pre 22.07 %    Family Pre 24.2 %    GLOBAL Pre 21.44 %            Scores of 19 and below usually indicate a poorer quality of life in these areas.  A difference of  2-3 points is a clinically meaningful difference.  A difference of 2-3 points in the total score of the Quality of Life Index has been associated with significant improvement in overall quality of life, self-image, physical symptoms, and general health in studies assessing change in quality of life.  PHQ-9: Review Flowsheet        12/29/2021 11/16/2021 11/13/2021 07/29/2021 02/09/2021  Depression screen PHQ 2/9  Decreased Interest 0 0 0 0 0  Down, Depressed, Hopeless 0 0 0 0 0  PHQ - 2 Score 0 0 0 0 0         Interpretation of Total Score  Total Score Depression Severity:  1-4 = Minimal  depression, 5-9 = Mild depression, 10-14 = Moderate depression, 15-19 = Moderately severe depression, 20-27 = Severe depression   Psychosocial Evaluation and Intervention:   Psychosocial Re-Evaluation:   Psychosocial Discharge (Final Psychosocial Re-Evaluation):   Vocational Rehabilitation: Provide vocational rehab assistance to qualifying candidates.   Vocational Rehab Evaluation & Intervention:  Vocational Rehab - 12/29/21 1228       Initial Vocational Rehab Evaluation & Intervention   Assessment shows need for Vocational Rehabilitation No   Rhea is retired and does not need vocational rehab at this time.            Education: Education Goals: Education classes will be provided on a weekly basis, covering required topics. Participant will state understanding/return demonstration of topics presented.  Learning Barriers/Preferences:  Learning Barriers/Preferences - 12/29/21 1226       Learning Barriers/Preferences   Learning Barriers Reading;Inability to learn new things   Patient's daughter says her father has poor short term memory   Learning Preferences Audio;Pictoral;Individual Instruction;Skilled Demonstration;Verbal Instruction             Education Topics: Count Your Pulse:  -Group instruction provided by verbal instruction, demonstration, patient participation and written materials to support subject.  Instructors address importance of being able to find your pulse and how to count your pulse when at home without a heart monitor.  Patients get hands on experience counting their pulse with staff help and individually.   Heart Attack, Angina, and Risk Factor Modification:  -Group instruction provided by verbal instruction, video, and written materials to support subject.  Instructors address signs and symptoms of angina and heart attacks.    Also discuss risk factors for heart disease and how to make changes to improve heart health risk  factors.   Functional Fitness:  -Group instruction provided by verbal instruction, demonstration, patient participation, and written materials to support subject.  Instructors address safety measures for doing things around the house.  Discuss how to get up and down off the floor, how to pick things up properly, how to  safely get out of a chair without assistance, and balance training.   Meditation and Mindfulness:  -Group instruction provided by verbal instruction, patient participation, and written materials to support subject.  Instructor addresses importance of mindfulness and meditation practice to help reduce stress and improve awareness.  Instructor also leads participants through a meditation exercise.    Stretching for Flexibility and Mobility:  -Group instruction provided by verbal instruction, patient participation, and written materials to support subject.  Instructors lead participants through series of stretches that are designed to increase flexibility thus improving mobility.  These stretches are additional exercise for major muscle groups that are typically performed during regular warm up and cool down.   Hands Only CPR:  -Group verbal, video, and participation provides a basic overview of AHA guidelines for community CPR. Role-play of emergencies allow participants the opportunity to practice calling for help and chest compression technique with discussion of AED use.   Hypertension: -Group verbal and written instruction that provides a basic overview of hypertension including the most recent diagnostic guidelines, risk factor reduction with self-care instructions and medication management.    Nutrition I class: Heart Healthy Eating:  -Group instruction provided by PowerPoint slides, verbal discussion, and written materials to support subject matter. The instructor gives an explanation and review of the Therapeutic Lifestyle Changes diet recommendations, which includes a  discussion on lipid goals, dietary fat, sodium, fiber, plant stanol/sterol esters, sugar, and the components of a well-balanced, healthy diet.   Nutrition II class: Lifestyle Skills:  -Group instruction provided by PowerPoint slides, verbal discussion, and written materials to support subject matter. The instructor gives an explanation and review of label reading, grocery shopping for heart health, heart healthy recipe modifications, and ways to make healthier choices when eating out.   Diabetes Question & Answer:  -Group instruction provided by PowerPoint slides, verbal discussion, and written materials to support subject matter. The instructor gives an explanation and review of diabetes co-morbidities, pre- and post-prandial blood glucose goals, pre-exercise blood glucose goals, signs, symptoms, and treatment of hypoglycemia and hyperglycemia, and foot care basics.   Diabetes Blitz:  -Group instruction provided by PowerPoint slides, verbal discussion, and written materials to support subject matter. The instructor gives an explanation and review of the physiology behind type 1 and type 2 diabetes, diabetes medications and rational behind using different medications, pre- and post-prandial blood glucose recommendations and Hemoglobin A1c goals, diabetes diet, and exercise including blood glucose guidelines for exercising safely.    Portion Distortion:  -Group instruction provided by PowerPoint slides, verbal discussion, written materials, and food models to support subject matter. The instructor gives an explanation of serving size versus portion size, changes in portions sizes over the last 20 years, and what consists of a serving from each food group.   Stress Management:  -Group instruction provided by verbal instruction, video, and written materials to support subject matter.  Instructors review role of stress in heart disease and how to cope with stress positively.     Exercising on Your  Own:  -Group instruction provided by verbal instruction, power point, and written materials to support subject.  Instructors discuss benefits of exercise, components of exercise, frequency and intensity of exercise, and end points for exercise.  Also discuss use of nitroglycerin and activating EMS.  Review options of places to exercise outside of rehab.  Review guidelines for sex with heart disease.   Cardiac Drugs I:  -Group instruction provided by verbal instruction and written materials to support subject.  Instructor reviews cardiac drug classes: antiplatelets, anticoagulants, beta blockers, and statins.  Instructor discusses reasons, side effects, and lifestyle considerations for each drug class.   Cardiac Drugs II:  -Group instruction provided by verbal instruction and written materials to support subject.  Instructor reviews cardiac drug classes: angiotensin converting enzyme inhibitors (ACE-I), angiotensin II receptor blockers (ARBs), nitrates, and calcium channel blockers.  Instructor discusses reasons, side effects, and lifestyle considerations for each drug class.   Anatomy and Physiology of the Circulatory System:  Group verbal and written instruction and models provide basic cardiac anatomy and physiology, with the coronary electrical and arterial systems. Review of: AMI, Angina, Valve disease, Heart Failure, Peripheral Artery Disease, Cardiac Arrhythmia, Pacemakers, and the ICD.   Other Education:  -Group or individual verbal, written, or video instructions that support the educational goals of the cardiac rehab program.   Holiday Eating Survival Tips:  -Group instruction provided by PowerPoint slides, verbal discussion, and written materials to support subject matter. The instructor gives patients tips, tricks, and techniques to help them not only survive but enjoy the holidays despite the onslaught of food that accompanies the holidays.   Knowledge Questionnaire Score:   Knowledge Questionnaire Score - 12/29/21 1012       Knowledge Questionnaire Score   Pre Score 19/24             Core Components/Risk Factors/Patient Goals at Admission:  Personal Goals and Risk Factors at Admission - 12/29/21 0955       Core Components/Risk Factors/Patient Goals on Admission   Diabetes Yes    Intervention Provide education about signs/symptoms and action to take for hypo/hyperglycemia.;Provide education about proper nutrition, including hydration, and aerobic/resistive exercise prescription along with prescribed medications to achieve blood glucose in normal ranges: Fasting glucose 65-99 mg/dL    Expected Outcomes Short Term: Participant verbalizes understanding of the signs/symptoms and immediate care of hyper/hypoglycemia, proper foot care and importance of medication, aerobic/resistive exercise and nutrition plan for blood glucose control.;Long Term: Attainment of HbA1C < 7%.    Heart Failure Yes    Intervention Provide a combined exercise and nutrition program that is supplemented with education, support and counseling about heart failure. Directed toward relieving symptoms such as shortness of breath, decreased exercise tolerance, and extremity edema.    Expected Outcomes Short term: Attendance in program 2-3 days a week with increased exercise capacity. Reported lower sodium intake. Reported increased fruit and vegetable intake. Reports medication compliance.;Improve functional capacity of life;Short term: Daily weights obtained and reported for increase. Utilizing diuretic protocols set by physician.;Long term: Adoption of self-care skills and reduction of barriers for early signs and symptoms recognition and intervention leading to self-care maintenance.    Hypertension Yes    Intervention Provide education on lifestyle modifcations including regular physical activity/exercise, weight management, moderate sodium restriction and increased consumption of fresh fruit,  vegetables, and low fat dairy, alcohol moderation, and smoking cessation.;Monitor prescription use compliance.    Expected Outcomes Short Term: Continued assessment and intervention until BP is < 140/34m HG in hypertensive participants. < 130/858mHG in hypertensive participants with diabetes, heart failure or chronic kidney disease.;Long Term: Maintenance of blood pressure at goal levels.    Lipids Yes    Intervention Provide education and support for participant on nutrition & aerobic/resistive exercise along with prescribed medications to achieve LDL '70mg'$ , HDL >'40mg'$ .    Expected Outcomes Short Term: Participant states understanding of desired cholesterol values and is compliant with medications prescribed. Participant is following exercise prescription and  nutrition guidelines.;Long Term: Cholesterol controlled with medications as prescribed, with individualized exercise RX and with personalized nutrition plan. Value goals: LDL < '70mg'$ , HDL > 40 mg.    Personal Goal Other Yes    Personal Goal To feel less tired. Get healthier and be able to exercise. Participate in future family events and grandson's future children.    Intervention Provide educaiton and support for participant on nutrition and aerobic/resistive exercise to help increase cardiorespiratory fitness, energy, and stamina.    Expected Outcomes Participant adheres to prescribed exercise prescription and nutrition plan. Decreased tiredness by patient report.             Core Components/Risk Factors/Patient Goals Review:    Core Components/Risk Factors/Patient Goals at Discharge (Final Review):    ITP Comments:  ITP Comments     Row Name 12/29/21 0835           ITP Comments Medical Director- Dr. Fransico Him, MD. Introduction to [facility] reviewed with the patient and his grandson Georgina Snell during orientation. Daughter to review full packet with the patient at home.                Comments: Patient attended  orientation for the cardiac rehabilitation program on 12/29/2021 to perform intake and exercise walk test. Introduction and orientation to the [facility] plan reviewed with patient. Completed 6-minute walk test, measurements, initial ITP, and exercise prescription. Vital signs stable. Telemetry- normal sinus rhythm, LBBB with questionable intermittent PAC's, asymptomatic. Time TK:1601 Time out: 1003

## 2022-01-04 ENCOUNTER — Telehealth: Payer: Self-pay | Admitting: Family Medicine

## 2022-01-04 ENCOUNTER — Ambulatory Visit (HOSPITAL_COMMUNITY): Payer: Medicare Other

## 2022-01-04 NOTE — Telephone Encounter (Signed)
Pt daughter called and stated that pt. Blood pressure 1'st reading was 214-89 and his second reading was 192-82. Should they bring pt.in the office

## 2022-01-04 NOTE — Telephone Encounter (Signed)
Spoke with patients daughter who states that patients BP was elevated at the dentist today but went down when he got home. Needing to be cleared from PCP before returning to have tooth pulled. Appt scheduled.

## 2022-01-05 ENCOUNTER — Telehealth: Payer: Self-pay | Admitting: Cardiovascular Disease

## 2022-01-05 NOTE — Telephone Encounter (Addendum)
Called spoke with pt and daughter in regards to BP readings sent in through my chart.  214/89 and 192/82 from yesterday at dentist appointment.  Today's BP 184/80 repeat 163/78. Reviewed the correct way to check BP.  Reports chest tightness at times.  Denies blurry vision, jaw pain, weakness to extremities.  Has not missed any doses of cardiac medications.  When asked about diet daughter reports pt has totally changed diet since heart attack.  Unable to get a clear picture of pt current food intake. Advised pt and daughter if BP goes up again to 200's to go to ED to be evaluated.  Has OV with PCP tomorrow  advised to notify our office if follow up is requested.

## 2022-01-05 NOTE — Telephone Encounter (Signed)
Pt c/o BP issue: STAT if pt c/o blurred vision, one-sided weakness or slurred speech  1. What are your last 5 BP readings?  214/80 - Yesterday 192/80 - Yesterday 182/82 - Today  2. Are you having any other symptoms (ex. Dizziness, headache, blurred vision, passed out)? Dizzy  3. What is your BP issue? Pt's daughter states that pt's BP has been running high since yesterday. She stated that pt was suppose to get a tooth pulled and the dentist would not do it due to BP running high. Please advise

## 2022-01-05 NOTE — Telephone Encounter (Signed)
Please see my chart encounter from today for complete details.

## 2022-01-06 ENCOUNTER — Telehealth (HOSPITAL_COMMUNITY): Payer: Self-pay | Admitting: *Deleted

## 2022-01-06 ENCOUNTER — Ambulatory Visit (HOSPITAL_COMMUNITY): Payer: Medicare Other

## 2022-01-06 ENCOUNTER — Ambulatory Visit (INDEPENDENT_AMBULATORY_CARE_PROVIDER_SITE_OTHER): Payer: Medicare Other | Admitting: Family Medicine

## 2022-01-06 ENCOUNTER — Encounter: Payer: Self-pay | Admitting: Family Medicine

## 2022-01-06 VITALS — BP 172/70 | HR 61 | Temp 96.6°F | Ht 70.0 in | Wt 186.4 lb

## 2022-01-06 DIAGNOSIS — I10 Essential (primary) hypertension: Secondary | ICD-10-CM

## 2022-01-06 MED ORDER — LOSARTAN POTASSIUM 50 MG PO TABS: 50.0000 mg | ORAL_TABLET | Freq: Every day | ORAL | 1 refills | Status: DC

## 2022-01-06 NOTE — Telephone Encounter (Signed)
Spoke with patient's daughter Ellison Hughs. Mr Ohm has had elevated blood pressures. Will hold off on cardiac rehab for the rest of the week and cancel appointment's for today and Friday.Harrell Gave RN BSN

## 2022-01-06 NOTE — Progress Notes (Signed)
Established Patient Office Visit  Subjective   Patient ID: Scott Donaldson, male    DOB: 1951/04/25  Age: 71 y.o. MRN: [id number]  Chief Complaint  Patient presents with   Hypertension    Follow up on elevated BP readings patient needing to be cleared before he can return to dentist. Patient also states that he has unexplained symptoms with his chest when he wakes up in the morning it feels like he can not breath for about 20 minutes then it goes away.     Hypertension Pertinent negatives include no blurred vision or headaches.   for follow-up of hypertension.  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  He has been mostly well since then he pends.  Blood pressure has been elevated at home and more recently in his dentist office.  He brings in a list that shows pressures averaging in the over 150 or 90 range.  His dentist recently put him on ibuprofen 800 mg as needed.  He is no longer taking it.  Patient has a history of diabetes and is not taking an ACE or an ARB.    Review of Systems  Constitutional: Negative.   HENT: Negative.    Eyes:  Negative for blurred vision, discharge and redness.  Respiratory: Negative.    Cardiovascular: Negative.   Gastrointestinal:  Negative for abdominal pain.  Genitourinary: Negative.   Musculoskeletal: Negative.  Negative for myalgias.  Skin:  Negative for rash.  Neurological:  Negative for dizziness, tingling, loss of consciousness, weakness and headaches.  Endo/Heme/Allergies:  Negative for polydipsia.      Objective:     BP (!) 172/70 (BP Location: Right Arm, Patient Position: Sitting, Cuff Size: Normal)   Pulse 61   Temp (!) 96.6 F (35.9 C) (Temporal)   Ht '5\' 10"'$  (1.778 m)   Wt 186 lb 6.4 oz (84.6 kg)   SpO2 98%   BMI 26.75 kg/m  Wt Readings from Last 3 Encounters:  01/06/22 186 lb 6.4 oz (84.6 kg)  12/29/21 185 lb 3 oz (84 kg)  11/16/21 188 lb 6.4 oz (85.5 kg)      Physical Exam Constitutional:      General: He  is not in acute distress.    Appearance: Normal appearance. He is not ill-appearing, toxic-appearing or diaphoretic.  HENT:     Head: Normocephalic and atraumatic.     Right Ear: External ear normal.     Left Ear: External ear normal.  Eyes:     General: No scleral icterus.       Right eye: No discharge.        Left eye: No discharge.     Extraocular Movements: Extraocular movements intact.     Conjunctiva/sclera: Conjunctivae normal.  Cardiovascular:     Rate and Rhythm: Normal rate and regular rhythm.  Pulmonary:     Effort: Pulmonary effort is normal. No respiratory distress.     Breath sounds: Normal breath sounds.  Musculoskeletal:     Cervical back: No rigidity or tenderness.  Skin:    General: Skin is warm and dry.  Neurological:     Mental Status: He is alert and oriented to person, place, and time.  Psychiatric:        Mood and Affect: Mood normal.        Behavior: Behavior normal.      No results found for any visits on 01/06/22.    The ASCVD Risk score (Arnett DK, et al., 2019)  failed to calculate for the following reasons:   The patient has a prior MI or stroke diagnosis    Assessment & Plan:   Problem List Items Addressed This Visit       Cardiovascular and Mediastinum   Essential hypertension - Primary   Relevant Medications   losartan (COZAAR) 50 MG tablet    Return in about 5 weeks (around 02/10/2022).  We will start losartan 50 mg added to his current regimen for further control of his hypertension and renal protection.  We will follow-up in 5 weeks for recheck.  He will stop the ibuprofen 800 mg and contact his dentist for alternative medication.  [name], MD

## 2022-01-08 ENCOUNTER — Telehealth (HOSPITAL_COMMUNITY): Payer: Self-pay | Admitting: *Deleted

## 2022-01-08 ENCOUNTER — Ambulatory Visit (HOSPITAL_COMMUNITY): Payer: Medicare Other

## 2022-01-08 NOTE — Telephone Encounter (Signed)
Talked with patient's daughter she thinks he needs another week with his new blood pressure addition. Will cancel appointments for next week. Patient's daughter Scott Donaldson hopes Scott Donaldson will be able to start exercise at cardiac rehab on June 26th.Harrell Gave RN BSN

## 2022-01-11 ENCOUNTER — Ambulatory Visit (HOSPITAL_COMMUNITY): Payer: Medicare Other

## 2022-01-13 ENCOUNTER — Ambulatory Visit (HOSPITAL_COMMUNITY): Payer: Medicare Other

## 2022-01-15 ENCOUNTER — Telehealth (HOSPITAL_COMMUNITY): Payer: Self-pay | Admitting: *Deleted

## 2022-01-15 ENCOUNTER — Ambulatory Visit (HOSPITAL_COMMUNITY): Payer: Medicare Other

## 2022-01-18 ENCOUNTER — Ambulatory Visit (HOSPITAL_COMMUNITY): Payer: Medicare Other

## 2022-01-19 ENCOUNTER — Telehealth (HOSPITAL_COMMUNITY): Payer: Self-pay | Admitting: *Deleted

## 2022-01-19 ENCOUNTER — Encounter: Payer: Self-pay | Admitting: Internal Medicine

## 2022-01-19 NOTE — Telephone Encounter (Signed)
Spoke with Daughter Scott Donaldson they would like to hold off on Scott Donaldson participating in phase 2 cardiac rehab at this time will discharge from the program. Scott Donaldson attended orientation on 12/29/21 and did not begin exercise.Thayer Headings RN BSN

## 2022-01-20 ENCOUNTER — Other Ambulatory Visit: Payer: Self-pay

## 2022-01-20 ENCOUNTER — Other Ambulatory Visit: Payer: Self-pay | Admitting: Family Medicine

## 2022-01-20 ENCOUNTER — Ambulatory Visit (HOSPITAL_COMMUNITY): Payer: Medicare Other

## 2022-01-20 DIAGNOSIS — I10 Essential (primary) hypertension: Secondary | ICD-10-CM

## 2022-01-20 MED ORDER — ACCU-CHEK SOFTCLIX LANCETS MISC: 12 refills | Status: AC

## 2022-01-20 MED ORDER — ACCU-CHEK GUIDE ME W/DEVICE KIT: PACK | 0 refills | Status: AC

## 2022-01-20 MED ORDER — ACCU-CHEK GUIDE VI STRP: ORAL_STRIP | 12 refills | Status: AC

## 2022-01-22 ENCOUNTER — Ambulatory Visit (HOSPITAL_COMMUNITY): Payer: Medicare Other

## 2022-01-25 ENCOUNTER — Ambulatory Visit (HOSPITAL_COMMUNITY): Payer: Medicare Other

## 2022-01-27 ENCOUNTER — Encounter: Payer: Self-pay | Admitting: Family Medicine

## 2022-01-27 ENCOUNTER — Other Ambulatory Visit: Payer: Self-pay

## 2022-01-27 ENCOUNTER — Ambulatory Visit (HOSPITAL_COMMUNITY): Payer: Medicare Other

## 2022-01-27 MED ORDER — ATORVASTATIN CALCIUM 80 MG PO TABS: 80.0000 mg | ORAL_TABLET | Freq: Every day | ORAL | 0 refills | Status: DC

## 2022-01-27 MED ORDER — METOPROLOL SUCCINATE ER 25 MG PO TB24: 12.5000 mg | ORAL_TABLET | Freq: Every day | ORAL | 0 refills | Status: DC

## 2022-01-27 NOTE — Telephone Encounter (Signed)
Chart supports Rx Last OV: 10/2021 Next OV: 02/2022

## 2022-01-29 ENCOUNTER — Ambulatory Visit (HOSPITAL_COMMUNITY): Payer: Medicare Other

## 2022-01-29 ENCOUNTER — Telehealth: Payer: Self-pay

## 2022-01-29 NOTE — Progress Notes (Signed)
  Chronic Care Management Pharmacy Assistant   Name: Scott Donaldson  MRN: [id number] DOB: 04/16/1951  Reason for Encounter:Hypertension  Disease State Call.   Recent office visits:  01/06/2022 Dr. Kremer MD (PCP) Start Losartan 50 mg daily,Return in about 5 weeks   Recent consult visits:  No recent consult visit  Hospital visits:  None in previous 6 months  Medications: Outpatient Encounter Medications as of 01/29/2022  Medication Sig   Accu-Chek Softclix Lancets lancets Check blood sugar 2 times daily   amLODipine (NORVASC) 5 MG tablet TAKE 1 TABLET(5 MG) BY MOUTH DAILY   amoxicillin (AMOXIL) 500 MG capsule Take 500 mg by mouth 3 (three) times daily.   aspirin EC 81 MG tablet Take 81 mg by mouth daily. Swallow whole.   atorvastatin (LIPITOR) 80 MG tablet Take 1 tablet (80 mg total) by mouth daily.   Blood Glucose Monitoring Suppl (ACCU-CHEK GUIDE ME) w/Device KIT Check blood sugars 2 times daily   ezetimibe (ZETIA) 10 MG tablet Take 1 tablet (10 mg total) by mouth daily.   glucose blood (ACCU-CHEK GUIDE) test strip Check blood sugars 2 times daily   glucose blood test strip 1 each by Other route 2 (two) times daily. And lancets 2/day   insulin glargine (LANTUS SOLOSTAR) 100 UNIT/ML Solostar Pen Inject 25 Units into the skin at bedtime. And pen needles 1/day (Patient taking differently: Inject 30 Units into the skin at bedtime. And pen needles 1/day)   Iron, Ferrous Sulfate, 325 (65 Fe) MG TABS Take one daily (Patient taking differently: Take 325 mg by mouth daily. Take one daily)   losartan (COZAAR) 50 MG tablet Take 1 tablet (50 mg total) by mouth daily.   magnesium oxide (MAG-OX) 400 (240 Mg) MG tablet Take 1 tablet by mouth daily.   metFORMIN (GLUMETZA) 1000 MG (MOD) 24 hr tablet Take 1 tablet (1,000 mg total) by mouth daily.   metoprolol succinate (TOPROL-XL) 25 MG 24 hr tablet Take 0.5 tablets (12.5 mg total) by mouth daily.   nitroGLYCERIN (NITROSTAT) 0.4 MG SL tablet  Place 1 tablet (0.4 mg total) under the tongue every 5 (five) minutes as needed for chest pain.   Study - [facility] - selatogrel 16 mg/0.5 mL or placebo SQ injection (PI-Christopher) Inject 16 mg into the skin once. For Investigational Use Only. Inject 0.5 mL subcutaneously into the abdomen or thigh if experiencing symptoms of a heart attack. Call 911 immediately and seek emergency medical support. Please contact [facility] with any questions or concerns regarding this medication.   Study - [facility] - selatogrel 16 mg/0.5 mL or placebo SQ injection (PI-Christopher) Inject 0.5 mLs (16 mg total) into the skin once for 1 dose. For Investigational Use Only. Inject 0.5 mL subcutaneously into the abdomen or thigh if experiencing symptoms of a heart attack. Call 911 immediately and seek emergency medical support.   ticagrelor (BRILINTA) 90 MG TABS tablet Take 1 tablet (90 mg total) by mouth 2 (two) times daily.   vitamin B-12 (CYANOCOBALAMIN) 1000 MCG tablet Take 1 tablet (1,000 mcg total) by mouth daily.   Facility-Administered Encounter Medications as of 01/29/2022  Medication   Study - [facility]I - placebo for selatogrel 0 mg/0.5 mL SQ injection for screening use only (PI-Christopher)    Care Gaps: HTN: 172/70 on 01/06/2022   Star Rating Drugs: Atorvastatin 40 mg last filled on 01/27/2022 for 90 day supply at [facility] pharmacy. Metformin 500 mg last filled on 08/20/2021 for 90 day supply at [facility].   losartan 50 mg last filled on 01/06/2022 for 90 day supply at [facility].   Medication Fill Gaps: None ID  Reviewed chart prior to disease state call. Spoke with patient regarding BP  Recent Office Vitals: BP Readings from Last 3 Encounters:  01/06/22 (!) 172/70  12/29/21 124/78  11/16/21 (!) 152/76   Pulse Readings from Last 3 Encounters:  01/06/22 61  12/29/21 73  11/16/21 63    Wt Readings from Last 3 Encounters:  01/06/22 186 lb 6.4 oz (84.6 kg)  12/29/21  185 lb 3 oz (84 kg)  11/16/21 188 lb 6.4 oz (85.5 kg)     Kidney Function Lab Results  Component Value Date/Time   CREATININE 0.86 11/16/2021 12:03 PM   CREATININE 0.92 10/23/2021 02:09 AM   GFR 87.11 11/16/2021 12:03 PM   GFRNONAA >60 10/23/2021 02:09 AM   GFRAA >60 04/06/2020 10:02 PM       Latest Ref Rng & Units 11/16/2021   12:03 PM 10/23/2021    2:09 AM 10/22/2021    1:00 AM  BMP  Glucose 70 - 99 mg/dL 181  163  270   BUN 6 - 23 mg/dL 16  18  15   Creatinine 0.40 - 1.50 mg/dL 0.86  0.92  0.82   Sodium 135 - 145 mEq/L 140  135  139   Potassium 3.5 - 5.1 mEq/L 3.6  3.8  3.7   Chloride 96 - 112 mEq/L 104  105  105   CO2 19 - 32 mEq/L 23  23  27   Calcium 8.4 - 10.5 mg/dL 9.0  8.8  9.3     Current antihypertensive regimen:  Amlodipine 5 mg daily Metoprolol XL 25 mg 1/2 tablet daily   Losartan 50 mg daily    What recent interventions/DTPs have been made by any provider to improve Blood Pressure control since last CPP Visit:  01/06/2022 Dr. Kremer MD (PCP) Start Losartan 50 mg daily   Any recent hospitalizations or ED visits since last visit with CPP? No  I have attempted without success to contact this patient by phone three times to do his hypertension Disease State call. I left a Voice message for patient to return my call.  Adherence Review: Is the patient currently on ACE/ARB medication? Yes Does the patient have >5 day gap between last estimated fill dates? No   Bessie Kellihan,CPA Clinical Pharmacist Assistant [phone]    

## 2022-02-01 ENCOUNTER — Ambulatory Visit (HOSPITAL_COMMUNITY): Payer: Medicare Other

## 2022-02-03 ENCOUNTER — Ambulatory Visit (HOSPITAL_COMMUNITY): Payer: Medicare Other

## 2022-02-05 ENCOUNTER — Ambulatory Visit (HOSPITAL_COMMUNITY): Payer: Medicare Other

## 2022-02-05 NOTE — Telephone Encounter (Signed)
error 

## 2022-02-08 ENCOUNTER — Ambulatory Visit (HOSPITAL_COMMUNITY): Payer: Medicare Other

## 2022-02-10 ENCOUNTER — Ambulatory Visit (HOSPITAL_COMMUNITY): Payer: Medicare Other

## 2022-02-12 ENCOUNTER — Ambulatory Visit (HOSPITAL_COMMUNITY): Payer: Medicare Other

## 2022-02-15 ENCOUNTER — Ambulatory Visit (HOSPITAL_COMMUNITY): Payer: Medicare Other

## 2022-02-17 ENCOUNTER — Ambulatory Visit (HOSPITAL_COMMUNITY): Payer: Medicare Other

## 2022-02-18 ENCOUNTER — Encounter: Payer: Self-pay | Admitting: *Deleted

## 2022-02-18 DIAGNOSIS — Z006 Encounter for examination for normal comparison and control in clinical research program: Secondary | ICD-10-CM

## 2022-02-18 NOTE — Research (Signed)
[id number], SOS-AMI     FOLLOW-UP PHONE CALLS  SUBJECT ID:  [id number] Trainer's name: Luz Lex Trainer's signature: on Delegation of [employer] Log Date of the Follow up call:  03/Aug2023  Visit Number:  Start time of the Follow up call:  13:00             End time of call: 13:18   - CONTACT   Q1;  Was the follow up phone call done with the subject?  []  YES  [x]  NO           If NO, check the following:      Q2:    Was the follow up phone call done with a family member               Or caregiver?    [x]  YES   []  NO          Make note about reasons Malachi Bonds is his care giver  AUTOINJECTOR LABEL:  Still legible?  [x]  Yes  []  No  - MEDICAL CONDITION      Q3:  Did any of the following occur?   []  Death       []  Hospitalization (any cause)     No events    []  Use of autoinjector  Make note about the type of event, and when it occurred. In case of hospitalization: the Location of the hospital and/or the treating physician's contact details  ____________ ____________________________________________________________________ ____________________________________________________________________  Note: remember to report any SAE/AE which has occurred within 30 days after any  injection of the study drug on relevant eCRF forms.   Q4:  Did the subject develop any condition which is an          exclusion criterion?  []  YES  [x]  NO   Take note about the occurrence of any exclusion criterion after randomization: _________________________________________________________________ __________________________________________________________________  Q5: Was there any change in subject's antithrombotic therapy?   []  YES  [x]  NO     Take not about any change of antithrombotic treatment: _______________________ ____________________________________________________________________ ____________________________________________________________________      Lezlie Octave OF THE STUDY-SPECFIC TRAINING  Q6: Did the subject correctly reply to the following questions?       A.  What are common heart attack symptoms?      [x]  YES   []  NO      B.  What has to be done in case any of those symptoms occurs? [x]  YES  []  NO      C.  What are the main steps to perform a self-injection?  [x]  YES  []  NO             If NO, then report which step/s was/were missing:        []  Choose injection site (abdomen or thigh)       []  Twist cap off       []  Pinch skin and place the study autoinjector       []  Firmly push down and hold for 3 seconds       D. What has to be done immediately after an injection?  [x]  YES   []  NO          If NO, then report which step/s was/were missing:       []  Call  for emergency medical help       []  Show the autoinjector to the emergency medical responder       E.  Does the subject recall where s/he keeps/ stores the autoinjectors? [] YES  [] NO          Note the place of storage and any corrective explanation if needed below __________________________________________________________________ __________________________________________________________________  - TRAINING REFRESHER   Q7:  Is a training refresher needed?      [] YES  [x] NO        If YES, indicate items that have to be refreshed. More than one may apply:               []  Heart attack symptoms             []  Actions to be taken following heart attack symptoms             []  Steps to perform the self-injection and follow-up actions to be taken   []  Other, Specify  _________________________________________      Current Outpatient Medications:    Accu-Chek Softclix Lancets lancets, Check blood sugar 2 times daily, Disp: 100 each, Rfl: 12   amLODipine (NORVASC) 5 MG tablet, TAKE 1 TABLET(5 MG) BY MOUTH DAILY, Disp: 90 tablet, Rfl: 0   amoxicillin (AMOXIL) 500 MG capsule, Take 500 mg by mouth 3 (three) times daily., Disp: , Rfl:    aspirin EC 81  MG tablet, Take 81 mg by mouth daily. Swallow whole., Disp: , Rfl:    atorvastatin (LIPITOR) 80 MG tablet, Take 1 tablet (80 mg total) by mouth daily., Disp: 90 tablet, Rfl: 0   Blood Glucose Monitoring Suppl (ACCU-CHEK GUIDE ME) w/Device KIT, Check blood sugars 2 times daily, Disp: 1 kit, Rfl: 0   ezetimibe (ZETIA) 10 MG tablet, Take 1 tablet (10 mg total) by mouth daily., Disp: 90 tablet, Rfl: 0   glucose blood (ACCU-CHEK GUIDE) test strip, Check blood sugars 2 times daily, Disp: 100 each, Rfl: 12   glucose blood test strip, 1 each by Other route 2 (two) times daily. And lancets 2/day, Disp: 200 each, Rfl: 3   insulin glargine (LANTUS SOLOSTAR) 100 UNIT/ML Solostar Pen, Inject 25 Units into the skin at bedtime. And pen needles 1/day (Patient taking differently: Inject 30 Units into the skin at bedtime. And pen needles 1/day), Disp: 30 mL, Rfl: 0   Iron, Ferrous Sulfate, 325 (65 Fe) MG TABS, Take one daily (Patient taking differently: Take 325 mg by mouth daily. Take one daily), Disp: 90 tablet, Rfl: 4   losartan (COZAAR) 50 MG tablet, Take 1 tablet (50 mg total) by mouth daily., Disp: 90 tablet, Rfl: 1   magnesium oxide (MAG-OX) 400 (240 Mg) MG tablet, Take 1 tablet by mouth daily., Disp: , Rfl:    metFORMIN (GLUMETZA) 1000 MG (MOD) 24 hr tablet, Take 1 tablet (1,000 mg total) by mouth daily., Disp: 30 tablet, Rfl: 0   metoprolol succinate (TOPROL-XL) 25 MG 24 hr tablet, Take 0.5 tablets (12.5 mg total) by mouth daily., Disp: 45 tablet, Rfl: 0   nitroGLYCERIN (NITROSTAT) 0.4 MG SL tablet, Place 1 tablet (0.4 mg total) under the tongue every 5 (five) minutes as needed for chest pain., Disp: 25 tablet, Rfl: 3   Study - SOS-AMI - selatogrel 16 mg/0.5 mL or placebo SQ injection (PI-Christopher), Inject 16 mg into the skin once. For Investigational Use Only. Inject 0.5 mL subcutaneously into the abdomen or thigh if experiencing symptoms of a heart attack. Call 911 immediately  and seek emergency medical  support. Please contact [facility] with any questions or concerns regarding this medication., Disp: , Rfl:    Study - SOS-AMI - selatogrel 16 mg/0.5 mL or placebo SQ injection (PI-Christopher), Inject 0.5 mLs (16 mg total) into the skin once for 1 dose. For Investigational Use Only. Inject 0.5 mL subcutaneously into the abdomen or thigh if experiencing symptoms of a heart attack. Call 911 immediately and seek emergency medical support., Disp: 1 mL, Rfl: 0   ticagrelor (BRILINTA) 90 MG TABS tablet, Take 1 tablet (90 mg total) by mouth 2 (two) times daily., Disp: 720 tablet, Rfl: 0   vitamin B-12 (CYANOCOBALAMIN) 1000 MCG tablet, Take 1 tablet (1,000 mcg total) by mouth daily., Disp: 90 tablet, Rfl: 1  Current Facility-Administered Medications:    Study - SOS-AMI - placebo for selatogrel 0 mg/0.5 mL SQ injection for screening use only (PI-Christopher), 0.5 mL, Subcutaneous, Once, Buford Dresser, MD

## 2022-02-19 ENCOUNTER — Ambulatory Visit (HOSPITAL_COMMUNITY): Payer: Medicare Other

## 2022-02-22 ENCOUNTER — Ambulatory Visit (HOSPITAL_COMMUNITY): Payer: Medicare Other

## 2022-02-24 ENCOUNTER — Ambulatory Visit (HOSPITAL_COMMUNITY): Payer: Medicare Other

## 2022-02-26 ENCOUNTER — Ambulatory Visit (HOSPITAL_COMMUNITY): Payer: Medicare Other

## 2022-03-03 ENCOUNTER — Telehealth: Payer: Self-pay

## 2022-03-03 NOTE — Progress Notes (Signed)
Chronic Care management APPOINTMENT REMINDER   Called Scott Donaldson, No answer, left message of appointment on 03/04/2022 at 3:45 pm via telephone visit with [name] , Pharm D. Notified to have all medications, supplements, blood pressure and/or blood sugar logs available during appointment and to return call if need to reschedule.  [place] Pharmacist Assistant [phone]

## 2022-03-04 ENCOUNTER — Telehealth: Payer: Medicare Other

## 2022-03-25 ENCOUNTER — Encounter: Payer: Self-pay | Admitting: Internal Medicine

## 2022-03-25 ENCOUNTER — Ambulatory Visit: Payer: Medicare Other | Admitting: Internal Medicine

## 2022-03-25 VITALS — BP 120/70 | HR 69 | Ht 70.0 in | Wt 184.2 lb

## 2022-03-25 DIAGNOSIS — E1165 Type 2 diabetes mellitus with hyperglycemia: Secondary | ICD-10-CM | POA: Diagnosis not present

## 2022-03-25 DIAGNOSIS — E785 Hyperlipidemia, unspecified: Secondary | ICD-10-CM | POA: Diagnosis not present

## 2022-03-25 DIAGNOSIS — E1159 Type 2 diabetes mellitus with other circulatory complications: Secondary | ICD-10-CM | POA: Diagnosis not present

## 2022-03-25 LAB — POCT GLYCOSYLATED HEMOGLOBIN (HGB A1C): Hemoglobin A1C: 10.3 % — AB (ref 4.0–5.6)

## 2022-03-25 MED ORDER — INSULIN LISPRO (1 UNIT DIAL) 100 UNIT/ML (KWIKPEN): 8.0000 [IU] | PEN_INJECTOR | Freq: Two times a day (BID) | SUBCUTANEOUS | 3 refills | Status: DC

## 2022-03-25 MED ORDER — LANTUS SOLOSTAR 100 UNIT/ML ~~LOC~~ SOPN: 30.0000 [IU] | PEN_INJECTOR | Freq: Every day | SUBCUTANEOUS | 3 refills | Status: DC

## 2022-03-25 NOTE — Progress Notes (Signed)
Patient ID: Scott Donaldson, male   DOB: 18-Mar-1951, 71 y.o.   MRN: [id number]  HPI: Scott Donaldson is a 71 y.o.-year-old male, returning for follow-up for DM2, dx in 1990, insulin-dependent since 2010, uncontrolled, with complications (CAD - s/p NSTEMI 09/2021, CHF, history of foot ulcer). Pt. previously saw Dr. Loanne Drilling, last visit 7 months ago. He is here with his daughter who offers info about his blood sugars, diabetic regimen, and PMH.  He is in a research study - with cardiology.  Reviewed HbA1c: Lab Results  Component Value Date   HGBA1C 10.4 (H) 10/22/2021   HGBA1C 10.7 (A) 08/21/2021   HGBA1C 11.4 (A) 02/13/2021   HGBA1C 8.0 (A) 07/29/2020   HGBA1C 8.4 (H) 03/27/2020   HGBA1C 9.3 (H) 12/05/2019   HGBA1C 10.4 (H) 07/23/2019   HGBA1C 9.2 (A) 12/01/2018   HGBA1C 9.1 (A) 09/07/2018   HGBA1C 10.6 (A) 07/03/2018   Pt is on a regimen of: - Metformin ER 500 mg 2x a day, with meals - Lantus 25 units at bedtime He was on Humalog >> stopped  - changed to Lantus.  Pt checks his sugars 1-2x a day and they are: - am: 129, 189-389 - 2h after b'fast: n/c - before lunch: 160-180 (not recently) - 2h after lunch: n/c - before dinner: 160-180 (not recently) - 2h after dinner: n/c - bedtime: 200-400s - nighttime: n/c Lowest sugar was 55 - not recently; he has hypoglycemia awareness at 110.  Highest sugar was HI.  Glucometer: AccuChek guide  Pt's meals are: - Breakfast: eggs, sausage sometimes, grits - Lunch: skips - Dinner: massed potatoes or fries + meat - Snacks: apple pie; drinks sweet tea - reduced intake of sweet drinks after his heart attack  He does not like veggies - usually; seldom salad.  - no CKD, last BUN/creatinine:  Lab Results  Component Value Date   BUN 16 11/16/2021   BUN 18 10/23/2021   CREATININE 0.86 11/16/2021   CREATININE 0.92 10/23/2021  He is on Losartan 50 mg daily.  -+ HL; last set of lipids: Lab Results  Component Value Date   CHOL 125  10/22/2021   HDL 44 10/22/2021   LDLCALC 66 10/22/2021   LDLDIRECT 72.0 01/22/2021   TRIG 74 10/22/2021   CHOLHDL 2.8 10/22/2021  He is on Lipitor 80 mg daily, Zetia 10 mg daily.  - last eye exam was in 12/2021. No DR.   He has IDA-on iron, B12 deficiency, HTN, GERD, BPH.  ROS: + see HPI No increased urination, blurry vision, nausea, chest pain.  Past Medical History:  Diagnosis Date   Abdominal pain 08/04/2016   Abnormal PSA 05/19/2015   Anemia    Arthralgia 11/12/2010   Benign prostatic hyperplasia with urinary hesitancy 06/14/2018   Cataract    Coronary artery disease    nonobstructive   Diabetes ([place]) 03/04/2007   Qualifier: Diagnosis of  By: Loanne Drilling MD, Sean A    DIABETES MELLITUS, TYPE II 03/04/2007   Elevated LDL cholesterol level 06/14/2018   Foot ulcer, right ([place]) 04/15/2014   GERD (gastroesophageal reflux disease)    HOH (hard of hearing)    both ears no hearing aides   HYPERCHOLESTEROLEMIA 07/29/2008   HYPERTENSION 03/04/2007   Knee pain, left 05/16/2015   Nonintractable episodic headache 12/01/2016   migraine   SHOULDER PAIN, BILATERAL 01/12/2010   Qualifier: Diagnosis of  By: Loanne Drilling MD, Sean A    TUBULOVILLOUS ADENOMA, 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   Qualifier: Diagnosis of  By:  Loanne Drilling MD, Jacelyn Pi     Past Surgical History:  Procedure Laterality Date   CARDIAC CATHETERIZATION     COLONOSCOPY  07/2017   CORONARY STENT INTERVENTION N/A 10/22/2021   Procedure: CORONARY STENT INTERVENTION;  Surgeon: Early Osmond, MD;  Location: [facility];  Service: Cardiovascular;  Laterality: N/A;   LEFT HEART CATH AND CORONARY ANGIOGRAPHY N/A 07/12/2018   Procedure: LEFT HEART CATH AND CORONARY ANGIOGRAPHY;  Surgeon: Wellington Hampshire, MD;  Location: [place] [facility];  Service: Cardiovascular;  Laterality: N/A;   LEFT HEART CATH AND CORONARY ANGIOGRAPHY N/A 10/22/2021   Procedure: LEFT HEART CATH AND CORONARY ANGIOGRAPHY;  Surgeon: Early Osmond, MD;  Location: [facility];  Service: Cardiovascular;  Laterality: N/A;   stress cardiolite  05/30/2003   TRANSURETHRAL RESECTION OF PROSTATE N/A 06/04/2020   Procedure: TRANSURETHRAL RESECTION OF THE PROSTATE (TURP), BIPOLAR;  Surgeon: Ceasar Mons, MD;  Location: [facility];  Service: Urology;  Laterality: N/A;   Social History   Socioeconomic History   Marital status: Single    Spouse name: Not on file   Number of children: 2   Years of education: 10   Highest education level: 10th grade  Occupational History   Occupation: roofer   Occupation: Retired  Tobacco Use   Smoking status: Former    Packs/day: 1.50    Years: 25.00    Total pack years: 37.50    Types: Cigarettes    Quit date: 07/26/1988    Years since quitting: 33.6   Smokeless tobacco: Never  Vaping Use   Vaping Use: Never used  Substance and Sexual Activity   Alcohol use: No   Drug use: No   Sexual activity: Not Currently  Other Topics Concern   Not on file  Social History Narrative   Lives with daughter in a one story home.  Has 2 children.  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  Education: 10th grade.    Social Determinants of Health   Financial Resource Strain: Low Risk  (11/13/2021)   Overall Financial Resource Strain (CARDIA)    Difficulty of Paying Living Expenses: Not hard at all  Food Insecurity: No Food Insecurity (11/13/2021)   Hunger Vital Sign    Worried About Running Out of Food in the Last Year: Never true    Ran Out of Food in the Last Year: Never true  Transportation Needs: No Transportation Needs (11/13/2021)   PRAPARE - Hydrologist (Medical): No    Lack of Transportation (Non-Medical): No  Physical Activity: Insufficiently Active (11/13/2021)   Exercise Vital Sign    Days of Exercise per Week: 2 days    Minutes of Exercise per Session: 20 min  Stress: No Stress Concern Present (11/13/2021)   [place]    Feeling of Stress : Not at all  Social Connections: Moderately Integrated (11/13/2021)   Social Connection and Isolation Panel [NHANES]    Frequency of Communication with Friends and Family: More than three times a week    Frequency of Social Gatherings with Friends and Family: More than three times a week    Attends Religious Services: 1 to 4 times per year    Active Member of [employer] or Organizations: Yes    Attends Archivist Meetings: Never    Marital Status: Never married  Intimate Partner Violence: Not At Risk (11/13/2021)   Humiliation,  Afraid, Rape, and Kick questionnaire    Fear of Current or Ex-Partner: No    Emotionally Abused: No    Physically Abused: No    Sexually Abused: No    Current Outpatient Medications on File Prior to Visit  Medication Sig Dispense Refill   Accu-Chek Softclix Lancets lancets Check blood sugar 2 times daily 100 each 12   amLODipine (NORVASC) 5 MG tablet TAKE 1 TABLET(5 MG) BY MOUTH DAILY 90 tablet 0   amoxicillin (AMOXIL) 500 MG capsule Take 500 mg by mouth 3 (three) times daily.     aspirin EC 81 MG tablet Take 81 mg by mouth daily. Swallow whole.     atorvastatin (LIPITOR) 80 MG tablet Take 1 tablet (80 mg total) by mouth daily. 90 tablet 0   Blood Glucose Monitoring Suppl (ACCU-CHEK GUIDE ME) w/Device KIT Check blood sugars 2 times daily 1 kit 0   ezetimibe (ZETIA) 10 MG tablet Take 1 tablet (10 mg total) by mouth daily. 90 tablet 0   glucose blood (ACCU-CHEK GUIDE) test strip Check blood sugars 2 times daily 100 each 12   glucose blood test strip 1 each by Other route 2 (two) times daily. And lancets 2/day 200 each 3   insulin glargine (LANTUS SOLOSTAR) 100 UNIT/ML Solostar Pen Inject 25 Units into the skin at bedtime. And pen needles 1/day (Patient taking differently: Inject 30 Units into the skin at bedtime. And pen needles 1/day) 30 mL 0   Iron, Ferrous Sulfate, 325 (65 Fe) MG TABS Take one daily (Patient taking  differently: Take 325 mg by mouth daily. Take one daily) 90 tablet 4   losartan (COZAAR) 50 MG tablet Take 1 tablet (50 mg total) by mouth daily. 90 tablet 1   magnesium oxide (MAG-OX) 400 (240 Mg) MG tablet Take 1 tablet by mouth daily.     metFORMIN (GLUMETZA) 1000 MG (MOD) 24 hr tablet Take 1 tablet (1,000 mg total) by mouth daily. 30 tablet 0   metoprolol succinate (TOPROL-XL) 25 MG 24 hr tablet Take 0.5 tablets (12.5 mg total) by mouth daily. 45 tablet 0   nitroGLYCERIN (NITROSTAT) 0.4 MG SL tablet Place 1 tablet (0.4 mg total) under the tongue every 5 (five) minutes as needed for chest pain. 25 tablet 3   Study - SOS-AMI - selatogrel 16 mg/0.5 mL or placebo SQ injection (PI-Christopher) Inject 16 mg into the skin once. For Investigational Use Only. Inject 0.5 mL subcutaneously into the abdomen or thigh if experiencing symptoms of a heart attack. Call 911 immediately and seek emergency medical support. Please contact [place] with any questions or concerns regarding this medication.     Study - SOS-AMI - selatogrel 16 mg/0.5 mL or placebo SQ injection (PI-Christopher) Inject 0.5 mLs (16 mg total) into the skin once for 1 dose. For Investigational Use Only. Inject 0.5 mL subcutaneously into the abdomen or thigh if experiencing symptoms of a heart attack. Call 911 immediately and seek emergency medical support. 1 mL 0   ticagrelor (BRILINTA) 90 MG TABS tablet Take 1 tablet (90 mg total) by mouth 2 (two) times daily. 720 tablet 0   vitamin B-12 (CYANOCOBALAMIN) 1000 MCG tablet Take 1 tablet (1,000 mcg total) by mouth daily. 90 tablet 1   Current Facility-Administered Medications on File Prior to Visit  Medication Dose Route Frequency Provider Last Rate Last Admin   Study - SOS-AMI - placebo for selatogrel 0 mg/0.5 mL SQ injection for screening use only (PI-Christopher)  0.5 mL Subcutaneous  Once Buford Dresser, MD        No Known Allergies  Family History  Problem  Relation Age of Onset   Cancer Mother        Breast Cancer   Cancer Father        uncertain type   Diabetes Father    Cancer Brother 21       Colon Cancer   Colon cancer Neg Hx    Rectal cancer Neg Hx    Stomach cancer Neg Hx    PE: BP 120/70 (BP Location: Right Arm, Patient Position: Sitting, Cuff Size: Normal)   Pulse 69   Ht 5' 10"  (1.778 m)   Wt 184 lb 3.2 oz (83.6 kg)   SpO2 99%   BMI 26.43 kg/m  Wt Readings from Last 3 Encounters:  03/25/22 184 lb 3.2 oz (83.6 kg)  01/06/22 186 lb 6.4 oz (84.6 kg)  12/29/21 185 lb 3 oz (84 kg)   Constitutional: overweight, in NAD Eyes: no exophthalmos ENT: moist mucous membranes, no thyromegaly, no cervical lymphadenopathy Cardiovascular: RRR, No MRG Respiratory: CTA B Musculoskeletal: no deformities Skin: moist, warm, no rashes Neurological: no tremor with outstretched hands  ASSESSMENT: 1. DM2, insulin-dependent, uncontrolled, with complications - CAD - s/p NSTEMI 09/2021 - CHF - history of foot ulcer  2. HL  PLAN:  1. Patient with long-standing, uncontrolled diabetes, on oral antidiabetic regimen with metformin and also long-acting insulin, with still poor control.  Latest HbA1c at the time of his NSTEMI was 10.4% (09/2021).  At today's visit, HbA1c is 10.3% (still very high, only slightly lower than before). -At today's visit, patient is mostly checking blood sugars in the morning.  They are usually much above goal - up to 300s.  He only checks later in the day when he is feeling poorly and these are also above goal. -He is taking a low-dose of basal insulin and he is also on metformin.  He tolerates this well. 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  My suspicion is that his sugars are increased significantly after every meal.  He agrees to add back Humalog.  He has 2 main meals a day and I advised him to vary the dose of Humalog based on the size and consistency of his meals, twice a  day.  He and his daughter agree with the plan.  In the meantime, since his sugars are so high, I advised him to also increase the Lantus dose. -We also discussed about the need to improve his diet. He cut down on his sweet drinks significantly after his heart attack 5 months ago but I advised him to stop sweet tea completely. -At next visit, we need to discuss about possibly adding an SGLT2 inhibitor especially with his history of CHF.  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  He will probably need to obtain this from the patient assistance program, which the daughter tells me that he uses for cardiology medications. -I also advised the daughter to call the insurance and find out which supplier they are working with.  Afterwards, she can let us know about this to send a prescription for a CGM.  I think this will greatly benefit the patient. - I suggested to:  Patient Instructions  Please continue: - Metformin ER 1000 mg 2x a day, with meals  Increase: - Lantus 30 units and move it to bedtime  Add: -  Humalog 8-12 units 15 min before meals  Please return in 2 months with your sugar log.     Stop Sweet Tea!!!  Please return in 3 months with your sugar log.   - check sugars at different times of the day - check 2-3x a day, rotating checks - discussed about CBG targets for treatment: 80-130 mg/dL before meals and <180 mg/dL after meals; target HbA1c <7%. - given sugar log and advised how to fill it and to bring it at next appt  - given foot care handout  - given instructions for hypoglycemia management "15-15 rule"  - advised for yearly eye exams  - he needs a foot exam -we will try to do this at next visit - Return to clinic in 2 mo with sugar log   2. HL - Reviewed latest lipid panel from 09/2021: LDL above our goal of less than 55 due to cardiovascular disease, the rest the fractions at goal: Lab Results  Component Value Date   CHOL 125  10/22/2021   HDL 44 10/22/2021   LDLCALC 66 10/22/2021   LDLDIRECT 72.0 01/22/2021   TRIG 74 10/22/2021   CHOLHDL 2.8 10/22/2021  - Continues Lipitor 80 mg daily and Zetia 10 mg daily without side effects.  - Total time spent for the visit: 40 min, in precharting, reviewing Dr. Cordelia Pen last note, obtaining medical information from the chart and from the pt. and his daughter, reviewing his  previous labs, evaluations, and treatments, reviewing his symptoms, counseling him about his diabetes (please see the discussed topics above), and developing a plan to further treat it.  Philemon Kingdom, MD PhD [facility] Endocrinology

## 2022-03-25 NOTE — Patient Instructions (Addendum)
Please continue: - Metformin ER 1000 mg 2x a day, with meals  Increase: - Lantus 30 units and move it to bedtime  Add: - Humalog 8-12 units 15 min before meals  Please return in 2 months with your sugar log.     Stop Sweet Tea!!!  Please return in 3 months with your sugar log.   PATIENT INSTRUCTIONS FOR TYPE 2 DIABETES:  DIET AND EXERCISE Diet and exercise is an important part of diabetic treatment.  We recommended aerobic exercise in the form of brisk walking (working between 40-60% of maximal aerobic capacity, similar to brisk walking) for 150 minutes per week (such as 30 minutes five days per week) along with 3 times per week performing 'resistance' training (using various gauge rubber tubes with handles) 5-10 exercises involving the major muscle groups (upper body, lower body and core) performing 10-15 repetitions (or near fatigue) each exercise. Start at half the above goal but build slowly to reach the above goals. If limited by weight, joint pain, or disability, we recommend daily walking in a swimming pool with water up to waist to reduce pressure from joints while allow for adequate exercise.    BLOOD GLUCOSES Monitoring your blood glucoses is important for continued management of your diabetes. Please check your blood glucoses 2-4 times a day: fasting, before meals and at bedtime (you can rotate these measurements - e.g. one day check before the 3 meals, the next day check before 2 of the meals and before bedtime, etc.).   HYPOGLYCEMIA (low blood sugar) Hypoglycemia is usually a reaction to not eating, exercising, or taking too much insulin/ other diabetes drugs.  Symptoms include tremors, sweating, hunger, confusion, headache, etc. Treat IMMEDIATELY with 15 grams of Carbs: 4 glucose tablets  cup regular juice/soda 2 tablespoons raisins 4 teaspoons sugar 1 tablespoon honey Recheck blood glucose in 15 mins and repeat above if still symptomatic/blood glucose  <100.  RECOMMENDATIONS TO REDUCE YOUR RISK OF DIABETIC COMPLICATIONS: * Take your prescribed MEDICATION(S) * Follow a DIABETIC diet: Complex carbs, fiber rich foods, (monounsaturated and polyunsaturated) fats * AVOID saturated/trans fats, high fat foods, >2,300 mg salt per day. * EXERCISE at least 5 times a week for 30 minutes or preferably daily.  * DO NOT SMOKE OR DRINK more than 1 drink a day. * Check your FEET every day. Do not wear tightfitting shoes. Contact us if you develop an ulcer * See your EYE doctor once a year or more if needed * Get a FLU shot once a year * Get a PNEUMONIA vaccine once before and once after age 58 years  GOALS:  * Your Hemoglobin A1c of <7%  * fasting sugars need to be <130 * after meals sugars need to be <180 (2h after you start eating) * Your Systolic BP should be 161 or lower  * Your Diastolic BP should be 80 or lower  * Your HDL (Good Cholesterol) should be 40 or higher  * Your LDL (Bad Cholesterol) should be 100 or lower. * Your Triglycerides should be 150 or lower  * Your Urine microalbumin (kidney function) should be <30 * Your Body Mass Index should be 25 or lower    Please consider the following ways to cut down carbs and fat and increase fiber and micronutrients in your diet: - substitute whole grain for white bread or pasta - substitute brown rice for white rice - substitute 90-calorie flat bread pieces for slices of bread when possible - substitute sweet potatoes or  yams for white potatoes - substitute humus for margarine - substitute tofu for cheese when possible - substitute almond or rice milk for regular milk (would not drink soy milk daily due to concern for soy estrogen influence on breast cancer risk) - substitute dark chocolate for other sweets when possible - substitute water - can add lemon or orange slices for taste - for diet sodas (artificial sweeteners will trick your body that you can eat sweets without getting calories and  will lead you to overeating and weight gain in the long run) - do not skip breakfast or other meals (this will slow down the metabolism and will result in more weight gain over time)  - can try smoothies made from fruit and almond/rice milk in am instead of regular breakfast - can also try old-fashioned (not instant) oatmeal made with almond/rice milk in am - order the dressing on the side when eating salad at a restaurant (pour less than half of the dressing on the salad) - eat as little meat as possible - can try juicing, but should not forget that juicing will get rid of the fiber, so would alternate with eating raw veg./fruits or drinking smoothies - use as little oil as possible, even when using olive oil - can dress a salad with a mix of balsamic vinegar and lemon juice, for e.g. - use agave nectar, stevia sugar, or regular sugar rather than artificial sweateners - steam or broil/roast veggies  - snack on veggies/fruit/nuts (unsalted, preferably) when possible, rather than processed foods - reduce or eliminate aspartame in diet (it is in diet sodas, chewing gum, etc) Read the labels!  Try to read Dr. Janene Harvey book: "Program for Reversing Diabetes" for other ideas for healthy eating.

## 2022-04-01 ENCOUNTER — Encounter: Payer: Self-pay | Admitting: Internal Medicine

## 2022-04-05 ENCOUNTER — Telehealth: Payer: Self-pay

## 2022-04-05 NOTE — Progress Notes (Signed)
Chronic Care Management Pharmacy Assistant   Name: ELOISE MULA  MRN: [id number] DOB: 1950/12/13  Reason for Encounter:Hypertension Disease State Call.   Recent office visits:  No recent office visit  Recent consult visits:  03/25/2022 Dr. Cruzita Lederer MD (Endocrinology) Increase Lantus 30 units, start Humalog 8-12 units, return in 3 months  Hospital visits:  None in previous 6 months  Medications: Outpatient Encounter Medications as of 04/05/2022  Medication Sig   Accu-Chek Softclix Lancets lancets Check blood sugar 2 times daily   amLODipine (NORVASC) 5 MG tablet TAKE 1 TABLET(5 MG) BY MOUTH DAILY   amoxicillin (AMOXIL) 500 MG capsule Take 500 mg by mouth 3 (three) times daily.   aspirin EC 81 MG tablet Take 81 mg by mouth daily. Swallow whole.   atorvastatin (LIPITOR) 80 MG tablet Take 1 tablet (80 mg total) by mouth daily.   Blood Glucose Monitoring Suppl (ACCU-CHEK GUIDE ME) w/Device KIT Check blood sugars 2 times daily   ezetimibe (ZETIA) 10 MG tablet Take 1 tablet (10 mg total) by mouth daily.   glucose blood (ACCU-CHEK GUIDE) test strip Check blood sugars 2 times daily   glucose blood test strip 1 each by Other route 2 (two) times daily. And lancets 2/day   insulin glargine (LANTUS SOLOSTAR) 100 UNIT/ML Solostar Pen Inject 30 Units into the skin at bedtime. And pen needles 1/day   insulin lispro (HUMALOG KWIKPEN) 100 UNIT/ML KwikPen Inject 8-12 Units into the skin 2 (two) times daily before a meal.   Iron, Ferrous Sulfate, 325 (65 Fe) MG TABS Take one daily (Patient taking differently: Take 325 mg by mouth daily. Take one daily)   losartan (COZAAR) 50 MG tablet Take 1 tablet (50 mg total) by mouth daily.   magnesium oxide (MAG-OX) 400 (240 Mg) MG tablet Take 1 tablet by mouth daily.   metFORMIN (GLUMETZA) 1000 MG (MOD) 24 hr tablet Take 1 tablet (1,000 mg total) by mouth daily.   metoprolol succinate (TOPROL-XL) 25 MG 24 hr tablet Take 0.5 tablets (12.5 mg total) by  mouth daily.   nitroGLYCERIN (NITROSTAT) 0.4 MG SL tablet Place 1 tablet (0.4 mg total) under the tongue every 5 (five) minutes as needed for chest pain.   Study - SOS-AMI - selatogrel 16 mg/0.5 mL or placebo SQ injection (PI-Christopher) Inject 16 mg into the skin once. For Investigational Use Only. Inject 0.5 mL subcutaneously into the abdomen or thigh if experiencing symptoms of a heart attack. Call 911 immediately and seek emergency medical support. Please contact [place] with any questions or concerns regarding this medication.   Study - SOS-AMI - selatogrel 16 mg/0.5 mL or placebo SQ injection (PI-[facility]opher) Inject 0.5 mLs (16 mg total) into the skin once for 1 dose. For Investigational Use Only. Inject 0.5 mL subcutaneously into the abdomen or thigh if experiencing symptoms of a heart attack. Call 911 immediately and seek emergency medical support.   ticagrelor (BRILINTA) 90 MG TABS tablet Take 1 tablet (90 mg total) by mouth 2 (two) times daily.   vitamin B-12 (CYANOCOBALAMIN) 1000 MCG tablet Take 1 tablet (1,000 mcg total) by mouth daily.   Facility-Administered Encounter Medications as of 04/05/2022  Medication   Study - SOS-AMI - placebo for selatogrel 0 mg/0.5 mL SQ injection for screening use only (PI-[facility]opher)    Care Gaps: HTN: 172/70 on 01/06/2022 Foot Exam   Star Rating Drugs: Atorvastatin 40 mg last filled on 01/27/2022 for 90 day supply at [facility]. Metformin 500 mg last  filled on 08/20/2021 for 90 day supply at [facility]. losartan 50 mg last filled on 04/02/2022 for 90 day supply at [facility].   Medication Fill Gaps: None ID  Reviewed chart prior to disease state call. Spoke with patient regarding BP  Recent Office Vitals: BP Readings from Last 3 Encounters:  03/25/22 120/70  01/06/22 (!) 172/70  12/29/21 124/78   Pulse Readings from Last 3 Encounters:  03/25/22 69  01/06/22 61  12/29/21 73    Wt Readings  from Last 3 Encounters:  03/25/22 184 lb 3.2 oz (83.6 kg)  01/06/22 186 lb 6.4 oz (84.6 kg)  12/29/21 185 lb 3 oz (84 kg)     Kidney Function Lab Results  Component Value Date/Time   CREATININE 0.86 11/16/2021 12:03 PM   CREATININE 0.92 10/23/2021 02:09 AM   GFR 87.11 11/16/2021 12:03 PM   GFRNONAA >60 10/23/2021 02:09 AM   GFRAA >60 04/06/2020 10:02 PM       Latest Ref Rng & Units 11/16/2021   12:03 PM 10/23/2021    2:09 AM 10/22/2021    1:00 AM  BMP  Glucose 70 - 99 mg/dL 181  163  270   BUN 6 - 23 mg/dL 16  18  15    Creatinine 0.40 - 1.50 mg/dL 0.86  0.92  0.82   Sodium 135 - 145 mEq/L 140  135  139   Potassium 3.5 - 5.1 mEq/L 3.6  3.8  3.7   Chloride 96 - 112 mEq/L 104  105  105   CO2 19 - 32 mEq/L 23  23  27    Calcium 8.4 - 10.5 mg/dL 9.0  8.8  9.3     Current antihypertensive regimen:  Amlodipine 5 mg daily Metoprolol XL 25 mg 1/2 tablet daily   Losartan 50 mg daily  How often are you checking your Blood Pressure? daily  Current home BP readings:  Patient daughter reports about three weeks ago patient blood pressure was elevated to 160/90, so she reach out to PCP to see what they need to do.Patient daughter states patient blood pressure has been good ranging around 120/75 since then.  What recent interventions/DTPs have been made by any provider to improve Blood Pressure control since last CPP Visit: None ID Any recent hospitalizations or ED visits since last visit with CPP? No  What diet changes have been made to improve Blood Pressure Control?  Patient daughter denies any changes to his diet.   What exercise is being done to improve your Blood Pressure Control?  None ID  Adherence Review: Is the patient currently on ACE/ARB medication? Yes Does the patient have >5 day gap between last estimated fill dates? No  Telephone follow up appointment with Care management team member scheduled for : 05/13/2022 at 11:00.  [place] Pharmacist  Assistant [phone]

## 2022-04-21 ENCOUNTER — Telehealth: Payer: Self-pay

## 2022-04-21 NOTE — Telephone Encounter (Signed)
**Note De-Identified Shavona Gunderman Obfuscation** Providers page of a [place] and Me pt asst application received Aaban Griep fax with a request to complete and to fax back to them. The RX for Brilinta 90 mg #180 with 3 refills is included on the providers page.  I have completed the providers page and have e-mailed it to Dr Kyla Balzarine nurse so she can obtain our DOD, Dr Antionette Char signature (in Dr Kyla Balzarine absence), date it, and to fax to [facility] and Me at the fax number written on the cover letter included.

## 2022-04-26 NOTE — Telephone Encounter (Signed)
Had Dr. [name] sign and faxed to number provided.

## 2022-04-27 ENCOUNTER — Other Ambulatory Visit: Payer: Self-pay | Admitting: Family Medicine

## 2022-04-27 ENCOUNTER — Encounter: Payer: Self-pay | Admitting: Family Medicine

## 2022-04-27 DIAGNOSIS — I10 Essential (primary) hypertension: Secondary | ICD-10-CM

## 2022-04-27 MED ORDER — METOPROLOL SUCCINATE ER 25 MG PO TB24: 12.5000 mg | ORAL_TABLET | Freq: Every day | ORAL | 0 refills | Status: DC

## 2022-05-04 ENCOUNTER — Other Ambulatory Visit: Payer: Self-pay | Admitting: Family Medicine

## 2022-05-13 ENCOUNTER — Telehealth: Payer: Medicare Other

## 2022-05-13 NOTE — Progress Notes (Deleted)
Chronic Care Management Pharmacy Note  05/13/2022 Name:  Scott Donaldson MRN:  [id number] DOB:  [birth year]  Summary: Patient presents for CCM follow-up. Today's visit was 100% conducted with patient's daughter Scott Donaldson.   Recommendations/Changes made from today's visit:  Plan: CPP follow-up 3 months  Recommended Problem List Changes:  Add: Hyperlipidemia associated with type 2 diabetes mellitus   Subjective: Scott Donaldson is an 71 y.o. year old male who is a primary patient of Ethelene Hal, Mortimer Fries, MD.  The CCM team was consulted for assistance with disease management and care coordination needs.    Engaged with patient by telephone for follow up visit in response to provider referral for pharmacy case management and/or care coordination services.   Consent to Services:  The patient was given information about Chronic Care Management services, agreed to services, and gave verbal consent prior to initiation of services.  Please see initial visit note for detailed documentation.   Patient Care Team: [name], MD as PCP - General (Family Medicine) Josue Hector, MD as PCP - Cardiology (Cardiology) Calvert Cantor, MD as Consulting Physician (Ophthalmology) Germaine Pomfret, [facility] as Pharmacist (Pharmacist)  Recent office visits: 01/06/22: Patient presented to Dr. Ethelene Hal for follow-up. Losartan 50 mg daily.  11/16/21: Patient presented to Dr. Ethelene Hal for follow-up.   Recent consult visits: 03/25/22: Patient presented to Dr. Cruzita Lederer (Endocrinology) for follow-up.    Hospital visits: 10/21/21: Patient admitted for NSTEMI.    Objective:  Lab Results  Component Value Date   CREATININE 0.86 11/16/2021   BUN 16 11/16/2021   GFR 87.11 11/16/2021   GFRNONAA >60 10/23/2021   GFRAA >60 04/06/2020   NA 140 11/16/2021   K 3.6 11/16/2021   CALCIUM 9.0 11/16/2021   CO2 23 11/16/2021   GLUCOSE 181 (H) 11/16/2021    Lab Results  Component Value Date/Time    HGBA1C 10.3 (A) 03/25/2022 04:33 PM   HGBA1C 10.4 (H) 10/22/2021 12:56 AM   HGBA1C 10.7 (A) 08/21/2021 08:33 AM   HGBA1C 8.4 (H) 03/27/2020 11:15 PM   GFR 87.11 11/16/2021 12:03 PM   GFR 80.70 07/29/2021 12:04 PM   MICROALBUR 40.5 (H) 12/05/2019 11:17 AM   MICROALBUR 37.8 (H) 07/23/2019 08:59 AM    Last diabetic Eye exam:  Lab Results  Component Value Date/Time   HMDIABEYEEXA No Retinopathy 12/25/2021 12:00 AM    Last diabetic Foot exam: No results found for: "HMDIABFOOTEX"   Lab Results  Component Value Date   CHOL 125 10/22/2021   HDL 44 10/22/2021   LDLCALC 66 10/22/2021   LDLDIRECT 72.0 01/22/2021   TRIG 74 10/22/2021   CHOLHDL 2.8 10/22/2021       Latest Ref Rng & Units 10/22/2021    1:00 AM 10/21/2021    4:01 PM 07/29/2021   12:04 PM  Hepatic Function  Total Protein 6.5 - 8.1 g/dL 6.5  7.2  7.2   Albumin 3.5 - 5.0 g/dL 3.6  4.0  4.3   AST 15 - 41 U/L $Remo'17  19  15   'SMZBq$ ALT 0 - 44 U/L $Remo'18  17  13   'gaTEX$ Alk Phosphatase 38 - 126 U/L 54  76  62   Total Bilirubin 0.3 - 1.2 mg/dL 0.6  0.6  0.7   Bilirubin, Direct 0.0 - 0.3 mg/dL   0.1     Lab Results  Component Value Date/Time   TSH 2.01 11/16/2021 12:03 PM   TSH 4.528 (H) 10/22/2021 12:56 AM   TSH 1.18  08/04/2016 08:32 AM       Latest Ref Rng & Units 11/16/2021   12:03 PM 10/22/2021    1:00 AM 10/21/2021    4:01 PM  CBC  WBC 4.0 - 10.5 K/uL 6.5  5.3  6.5   Hemoglobin 13.0 - 17.0 g/dL 13.2  12.6  13.3   Hematocrit 39.0 - 52.0 % 38.7  35.5  37.9   Platelets 150.0 - 400.0 K/uL 164.0  160  187     No results found for: "VD25OH"  Clinical ASCVD: Yes  The ASCVD Risk score (Arnett DK, et al., 2019) failed to calculate for the following reasons:   The patient has a prior MI or stroke diagnosis       01/06/2022    9:05 AM 12/29/2021   12:29 PM 11/16/2021   11:28 AM  Depression screen PHQ 2/9  Decreased Interest 0 0 0  Down, Depressed, Hopeless 0 0 0  PHQ - 2 Score 0 0 0     Social History   Tobacco Use  Smoking  Status Former   Packs/day: 1.50   Years: 25.00   Total pack years: 37.50   Types: Cigarettes   Quit date: 07/26/1988   Years since quitting: 33.8  Smokeless Tobacco Never   BP Readings from Last 3 Encounters:  03/25/22 120/70  01/06/22 (!) 172/70  12/29/21 124/78   Pulse Readings from Last 3 Encounters:  03/25/22 69  01/06/22 61  12/29/21 73   Wt Readings from Last 3 Encounters:  03/25/22 184 lb 3.2 oz (83.6 kg)  01/06/22 186 lb 6.4 oz (84.6 kg)  12/29/21 185 lb 3 oz (84 kg)   BMI Readings from Last 3 Encounters:  03/25/22 26.43 kg/m  01/06/22 26.75 kg/m  12/29/21 26.01 kg/m    Assessment/Interventions: Review of patient past medical history, allergies, medications, health status, including review of consultants reports, laboratory and other test data, was performed as part of comprehensive evaluation and provision of chronic care management services.   SDOH:  (Social Determinants of Health) assessments and interventions performed: Yes SDOH Interventions    Flowsheet Row Clinical Support from 11/13/2021 in [place] Interventions Intervention Not Indicated  Housing Interventions Intervention Not Indicated  Transportation Interventions Intervention Not Indicated  Financial Strain Interventions Intervention Not Indicated  Physical Activity Interventions Other (Comments)  [Encouraged chair exercises.]  Stress Interventions Intervention Not Indicated  Social Connections Interventions Intervention Not Indicated      SDOH Screenings   Food Insecurity: No Food Insecurity (11/13/2021)  Housing: Low Risk  (11/13/2021)  Transportation Needs: No Transportation Needs (11/13/2021)  Alcohol Screen: Low Risk  (11/13/2021)  Depression (PHQ2-9): Low Risk  (01/06/2022)  Financial Resource Strain: Low Risk  (11/13/2021)  Physical Activity: Insufficiently Active (11/13/2021)  Social Connections: Moderately Integrated  (11/13/2021)  Stress: No Stress Concern Present (11/13/2021)  Tobacco Use: Medium Risk (03/25/2022)    CCM Care Plan  No Known Allergies  Medications Reviewed Today     Reviewed by Philemon Kingdom, MD (Physician) on 03/25/22 at 1618  Med List Status: <None>   Medication Order Taking? Sig Documenting Provider Last Dose Status Informant  Accu-Chek Softclix Lancets lancets [id number]  Check blood sugar 2 times daily Philemon Kingdom, MD  Active   amLODipine (NORVASC) 5 MG tablet [id number]  TAKE 1 TABLET(5 MG) BY MOUTH DAILY [name], MD  Active   amoxicillin (AMOXIL) 500 MG capsule [id number]  Take 500 mg by mouth 3 (  three) times daily. [provider]  Active   aspirin EC 81 MG tablet [id number] No Take 81 mg by mouth daily. Swallow whole. [provider] Taking Active Self  atorvastatin (LIPITOR) 80 MG tablet [id number]  Take 1 tablet (80 mg total) by mouth daily. [name], MD  Active   Blood Glucose Monitoring Suppl (ACCU-CHEK GUIDE ME) w/Device KIT [id number]  Check blood sugars 2 times daily Philemon Kingdom, MD  Active   ezetimibe (ZETIA) 10 MG tablet [id number] No Take 1 tablet (10 mg total) by mouth daily. Irene Pap N, DO Taking Expired 01/21/22 2359   glucose blood (ACCU-CHEK GUIDE) test strip [id number]  Check blood sugars 2 times daily Philemon Kingdom, MD  Active   glucose blood test strip [id number] No 1 each by Other route 2 (two) times daily. And lancets 2/day Renato Shin, MD Taking Active Self  insulin glargine (LANTUS SOLOSTAR) 100 UNIT/ML Solostar Pen [id number] No Inject 25 Units into the skin at bedtime. And pen needles 1/day  Patient taking differently: Inject 30 Units into the skin at bedtime. And pen needles 1/day   Kayleen Memos, DO Taking Active   Iron, Ferrous Sulfate, 325 (65 Fe) MG TABS [id number] No Take one daily  Patient taking differently: Take 325 mg by mouth daily. Take one daily   [name], MD Taking  Active Self  losartan (COZAAR) 50 MG tablet [id number]  Take 1 tablet (50 mg total) by mouth daily. [name], MD  Active   magnesium oxide (MAG-OX) 400 (240 Mg) MG tablet [id number] No Take 1 tablet by mouth daily. [provider] Taking Active   metFORMIN (GLUMETZA) 1000 MG (MOD) 24 hr tablet [id number] No Take 1 tablet (1,000 mg total) by mouth daily. Kayleen Memos, DO Taking Expired 01/06/22 2359   metoprolol succinate (TOPROL-XL) 25 MG 24 hr tablet [id number]  Take 0.5 tablets (12.5 mg total) by mouth daily. [name], MD  Active   nitroGLYCERIN (NITROSTAT) 0.4 MG SL tablet [id number] No Place 1 tablet (0.4 mg total) under the tongue every 5 (five) minutes as needed for chest pain. Loel Dubonnet, NP Taking Expired 02/01/22 2359   Study - SOS-AMI - placebo for selatogrel 0 mg/0.5 mL SQ injection for screening use only (PI-Christopher) [id number]   Buford Dresser, MD  Active   Study - SOS-AMI - selatogrel 16 mg/0.5 mL or placebo SQ injection (PI-[facility]opher) [id number] No Inject 16 mg into the skin once. For Investigational Use Only. Inject 0.5 mL subcutaneously into the abdomen or thigh if experiencing symptoms of a heart attack. Call 911 immediately and seek emergency medical support. Please contact [place] with any questions or concerns regarding this medication. Buford Dresser, MD Taking Active Pharmacy Records  Study - SOS-AMI - selatogrel 16 mg/0.5 mL or placebo SQ injection (PI-[facility]opher) [id number] No Inject 0.5 mLs (16 mg total) into the skin once for 1 dose. For Investigational Use Only. Inject 0.5 mL subcutaneously into the abdomen or thigh if experiencing symptoms of a heart attack. Call 911 immediately and seek emergency medical support. Buford Dresser, MD Taking Expired 01/06/22 2359   ticagrelor (BRILINTA) 90 MG TABS tablet [id number] No Take 1 tablet (90 mg total) by mouth 2 (two) times daily. Kayleen Memos, DO  Taking Active   vitamin B-12 (CYANOCOBALAMIN) 1000 MCG tablet [id number] No Take 1 tablet (1,000 mcg total) by mouth daily. [name], MD Taking Active Self  Patient Active Problem List   Diagnosis Date Noted   Light-headedness 10/22/2021   Chronic diastolic CHF (congestive heart failure) ([place]) 10/22/2021   NSTEMI (non-ST elevated myocardial infarction) ([place]) 10/21/2021   Right upper quadrant abdominal pain 07/29/2021   Pre-op evaluation 02/09/2021   Elevated uric acid in blood 10/22/2020   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   Iron deficiency 04/18/2020   B12 deficiency 04/18/2020   Anemia 04/17/2020   Urinary retention 04/17/2020   Balanitis 04/04/2020   Hospital discharge follow-up 04/04/2020   Prostatitis 03/28/2020   Hypotension due to hypovolemia 03/27/2020   Hyperkalemia 03/27/2020   Acute kidney injury ([place]) 03/27/2020   Right knee pain 03/07/2020   Acute idiopathic gout of right knee 02/25/2020   Bilateral carotid bruits 12/05/2019   Type 2 diabetes mellitus with diabetic polyneuropathy, with long-term current use of insulin ([place]) 09/05/2019   Diabetes mellitus ([place]) 09/05/2019   Cervical radiculopathy 02/12/2019   Constipation 02/12/2019   DOE (dyspnea on exertion) 02/12/2019   Chest wall pain 02/12/2019   No-show for appointment 11/21/2018   Coronary artery disease involving native heart 07/20/2018   Bronchitis 07/20/2018   Abnormal stress test    Elevated LDL cholesterol level 06/14/2018   Swelling of left foot 08/05/2017   Nonintractable episodic headache 12/01/2016   Cough 08/04/2016   Right lower quadrant abdominal pain 08/04/2016   Abnormal PSA 05/19/2015   OA (osteoarthritis) of knee 05/16/2015   Foot ulcer, right (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   HYPERCHOLESTEROLEMIA 07/29/2008   CELLULITIS, FOOT, RIGHT 07/02/2008   EDEMA 07/02/2008   Type 2 diabetes mellitus with hyperglycemia, with long-term current use of insulin ([place]) 03/04/2007   Essential hypertension 03/04/2007    Immunization History  Administered Date(s) Administered   Fluad Quad(high Dose 65+) 05/31/2019   Influenza Split 05/17/2011   Influenza Whole 05/15/2010   Influenza, High Dose Seasonal PF 06/14/2018   Influenza,inj,Quad PF,6+ Mos 06/12/2013   Pneumococcal Conjugate-13 08/04/2016   Pneumococcal Polysaccharide-23 05/16/2015, 03/28/2020   Td 11/23/1997, 07/02/2008   Tdap 09/02/2016    Conditions to be addressed/monitored:  Hypertension, Hyperlipidemia, Diabetes, and History of MI  There are no care plans that you recently modified to display for this patient.     Medication Assistance:  Eric Form obtained through [place] medication [place].  Enrollment ends NA .  Compliance/Adherence/Medication fill history: Care Gaps: Urine Microalbumin  Star-Rating Drugs: Atorvastatin 80 mg: Last filled 11/09/21 for 90-DS  Patient's preferred pharmacy is:  [facility] [address] [place] [address] Phone: [phone] Fax: [phone]  Uses pill box? Yes Pt endorses 100% compliance  We discussed: Current pharmacy is preferred with insurance plan and patient is satisfied with pharmacy services Patient decided to: Continue current medication management strategy  Care Plan and Follow Up Patient Decision:  Patient agrees to Care Plan and Follow-up.  Plan: Telephone follow up appointment with care management team member scheduled for:  03/04/2022 at 3:45 PM  [name], PharmD, [place], CPP Clinical Pharmacist Practitioner  [facility]  [phone]  Current Barriers:  Unable to achieve control of diabetes   Pharmacist  Clinical Goal(s):  Patient will achieve control of Diabetes as evidenced by A1c less than 8% through collaboration with PharmD and provider.   Interventions: 1:1 collaboration with [name], MD regarding development and update of comprehensive plan of care as evidenced by provider attestation and co-signature Inter-disciplinary care team collaboration (see longitudinal plan of care) Comprehensive medication review performed; medication list updated in electronic medical record  Heart Failure (Goal: manage symptoms and prevent exacerbations) -Controlled -Last ejection fraction: 55-60% (Date: Mar 2023) -HF type: Diastolic -NYHA Class: I (no actitivty limitation) -Current treatment: Amlodipine 5 mg daily Metoprolol XL 25 mg 1/2 tablet daily   -Medications previously tried: NA  -Current home BP/HR readings: 132-145/65-75  -Endorses swelling in legs, going on since Saturday  -Recommended to continue current medication   Hyperlipidemia: (LDL goal < 55) -Uncontrolled -NSTEMI Mar 2023 -Current treatment: Atorvastatin 80 mg daily  Ezetimibe 10 mg daily  -Current antiplatelet treatment: Aspirin 81 mg daily  Ticagrelor 90 mg twice daily  -Medications previously tried: NA  -Would recommend more aggressive LDL goal given recent NSTEMI.  -Recommended to continue current medication -Recommend rechecking FLP ~June 2023  Diabetes (A1c goal <8%) -Uncontrolled -Current medications:  Humalog 8-12 units twice daily  Lantus 30 units at bedtime  Metformin 1000 mg daily  -Medications previously tried: [place] (unclear) -Current home glucose readings fasting glucose:  post prandial glucose:  -Denies hypoglycemic/hyperglycemic symptoms -Recommended to continue current medication.  Patient Goals/Self-Care Activities Patient will:  - check glucose twice daily, before breakfast and  before supper, document, and provide at future appointments check blood pressure 2-3 times weekly, document, and provide at future appointments  Follow Up Plan: ***

## 2022-05-18 ENCOUNTER — Telehealth: Payer: Self-pay

## 2022-05-18 NOTE — Progress Notes (Signed)
I reach to patient daughter to schedule appointment with the Clinical pharmacist as requested.  Telephone follow up appointment with Care management team member scheduled for : 06/03/2022 at 8:00 am.  [place] Pharmacist Assistant [phone]

## 2022-06-01 ENCOUNTER — Telehealth: Payer: Self-pay | Admitting: *Deleted

## 2022-06-01 ENCOUNTER — Encounter: Payer: Medicare Other | Admitting: *Deleted

## 2022-06-01 MED ORDER — STUDY - SOS-AMI - SELATOGREL 16 MG/0.5 ML OR PLACEBO SQ INJECTION (PI-CHRISTOPHER): 16.0000 mg | INJECTION | Freq: Once | SUBCUTANEOUS | 0 refills | Status: DC

## 2022-06-01 NOTE — Telephone Encounter (Signed)
LMTCB  Phone cut off as I was leaving a message

## 2022-06-03 ENCOUNTER — Telehealth: Payer: Self-pay | Admitting: *Deleted

## 2022-06-03 ENCOUNTER — Telehealth: Payer: Medicare Other

## 2022-06-03 DIAGNOSIS — Z006 Encounter for examination for normal comparison and control in clinical research program: Secondary | ICD-10-CM

## 2022-06-03 NOTE — Telephone Encounter (Signed)
Patient missed his scheduled appointment for SOS-AMI on Nov. 7 at 1pm.. I called him to re-schedule appointment.. We need to change out his study medication.

## 2022-06-03 NOTE — Progress Notes (Deleted)
Chronic Care Management Pharmacy Note  06/03/2022 Name:  Scott Donaldson MRN:  [id number] DOB:  1950/08/17  Summary: Patient presents for CCM follow-up. Today's visit was 100% conducted with patient's daughter Ellison Hughs.   Recommendations/Changes made from today's visit:  Plan: CPP follow-up 3 months  Recommended Problem List Changes:  Add: Hyperlipidemia associated with type 2 diabetes mellitus   Subjective: Scott Donaldson is an 71 y.o. year old male who is a primary patient of Ethelene Hal, Mortimer Fries, MD.  The CCM team was consulted for assistance with disease management and care coordination needs.    Engaged with patient by telephone for follow up visit in response to provider referral for pharmacy case management and/or care coordination services.   Consent to Services:  The patient was given information about Chronic Care Management services, agreed to services, and gave verbal consent prior to initiation of services.  Please see initial visit note for detailed documentation.   Patient Care Team: [name], MD as PCP - General (Family Medicine) Josue Hector, MD as PCP - Cardiology (Cardiology) Calvert Cantor, MD as Consulting Physician (Ophthalmology) Germaine Pomfret, [facility] as Pharmacist (Pharmacist)  Recent office visits: 01/06/22: Patient presented to Dr. Ethelene Hal for follow-up. Losartan 50 mg daily.  11/16/21: Patient presented to Dr. Ethelene Hal for follow-up.   Recent consult visits: 03/25/22: Patient presented to Dr. Cruzita Lederer (Endocrinology) for follow-up.    Hospital visits: 10/21/21: Patient admitted for NSTEMI.    Objective:  Lab Results  Component Value Date   CREATININE 0.86 11/16/2021   BUN 16 11/16/2021   GFR 87.11 11/16/2021   GFRNONAA >60 10/23/2021   GFRAA >60 04/06/2020   NA 140 11/16/2021   K 3.6 11/16/2021   CALCIUM 9.0 11/16/2021   CO2 23 11/16/2021   GLUCOSE 181 (H) 11/16/2021    Lab Results  Component Value Date/Time    HGBA1C 10.3 (A) 03/25/2022 04:33 PM   HGBA1C 10.4 (H) 10/22/2021 12:56 AM   HGBA1C 10.7 (A) 08/21/2021 08:33 AM   HGBA1C 8.4 (H) 03/27/2020 11:15 PM   GFR 87.11 11/16/2021 12:03 PM   GFR 80.70 07/29/2021 12:04 PM   MICROALBUR 40.5 (H) 12/05/2019 11:17 AM   MICROALBUR 37.8 (H) 07/23/2019 08:59 AM    Last diabetic Eye exam:  Lab Results  Component Value Date/Time   HMDIABEYEEXA No Retinopathy 12/25/2021 12:00 AM    Last diabetic Foot exam: No results found for: "HMDIABFOOTEX"   Lab Results  Component Value Date   CHOL 125 10/22/2021   HDL 44 10/22/2021   LDLCALC 66 10/22/2021   LDLDIRECT 72.0 01/22/2021   TRIG 74 10/22/2021   CHOLHDL 2.8 10/22/2021       Latest Ref Rng & Units 10/22/2021    1:00 AM 10/21/2021    4:01 PM 07/29/2021   12:04 PM  Hepatic Function  Total Protein 6.5 - 8.1 g/dL 6.5  7.2  7.2   Albumin 3.5 - 5.0 g/dL 3.6  4.0  4.3   AST 15 - 41 U/L _0 ALT 0 - 44 U/L _1 Alk Phosphatase 38 - 126 U/L 54  76  62   Total Bilirubin 0.3 - 1.2 mg/dL 0.6  0.6  0.7   Bilirubin, Direct 0.0 - 0.3 mg/dL   0.1     Lab Results  Component Value Date/Time   TSH 2.01 11/16/2021 12:03 PM   TSH 4.528 (H) 10/22/2021 12:56 AM   TSH 1.18  08/04/2016 08:32 AM       Latest Ref Rng & Units 11/16/2021   12:03 PM 10/22/2021    1:00 AM 10/21/2021    4:01 PM  CBC  WBC 4.0 - 10.5 K/uL 6.5  5.3  6.5   Hemoglobin 13.0 - 17.0 g/dL 13.2  12.6  13.3   Hematocrit 39.0 - 52.0 % 38.7  35.5  37.9   Platelets 150.0 - 400.0 K/uL 164.0  160  187     No results found for: "VD25OH"  Clinical ASCVD: Yes  The ASCVD Risk score (Arnett DK, et al., 2019) failed to calculate for the following reasons:   The patient has a prior MI or stroke diagnosis       01/06/2022    9:05 AM 12/29/2021   12:29 PM 11/16/2021   11:28 AM  Depression screen PHQ 2/9  Decreased Interest 0 0 0  Down, Depressed, Hopeless 0 0 0  PHQ - 2 Score 0 0 0     Social History   Tobacco Use  Smoking  Status Former   Packs/day: 1.50   Years: 25.00   Total pack years: 37.50   Types: Cigarettes   Quit date: 07/26/1988   Years since quitting: 33.8  Smokeless Tobacco Never   BP Readings from Last 3 Encounters:  03/25/22 120/70  01/06/22 (!) 172/70  12/29/21 124/78   Pulse Readings from Last 3 Encounters:  03/25/22 69  01/06/22 61  12/29/21 73   Wt Readings from Last 3 Encounters:  03/25/22 184 lb 3.2 oz (83.6 kg)  01/06/22 186 lb 6.4 oz (84.6 kg)  12/29/21 185 lb 3 oz (84 kg)   BMI Readings from Last 3 Encounters:  03/25/22 26.43 kg/m  01/06/22 26.75 kg/m  12/29/21 26.01 kg/m    Assessment/Interventions: Review of patient past medical history, allergies, medications, health status, including review of consultants reports, laboratory and other test data, was performed as part of comprehensive evaluation and provision of chronic care management services.   SDOH:  (Social Determinants of Health) assessments and interventions performed: Yes SDOH Interventions    Flowsheet Row Clinical Support from 11/13/2021 in [place] Interventions Intervention Not Indicated  Housing Interventions Intervention Not Indicated  Transportation Interventions Intervention Not Indicated  Financial Strain Interventions Intervention Not Indicated  Physical Activity Interventions Other (Comments)  [Encouraged chair exercises.]  Stress Interventions Intervention Not Indicated  Social Connections Interventions Intervention Not Indicated      SDOH Screenings   Food Insecurity: No Food Insecurity (11/13/2021)  Housing: Low Risk  (11/13/2021)  Transportation Needs: No Transportation Needs (11/13/2021)  Alcohol Screen: Low Risk  (11/13/2021)  Depression (PHQ2-9): Low Risk  (01/06/2022)  Financial Resource Strain: Low Risk  (11/13/2021)  Physical Activity: Insufficiently Active (11/13/2021)  Social Connections: Moderately Integrated  (11/13/2021)  Stress: No Stress Concern Present (11/13/2021)  Tobacco Use: Medium Risk (03/25/2022)    CCM Care Plan  No Known Allergies  Medications Reviewed Today     Reviewed by Philemon Kingdom, MD (Physician) on 03/25/22 at 1618  Med List Status: <None>   Medication Order Taking? Sig Documenting Provider Last Dose Status Informant  Accu-Chek Softclix Lancets lancets [id number]  Check blood sugar 2 times daily Philemon Kingdom, MD  Active   amLODipine (NORVASC) 5 MG tablet [id number]  TAKE 1 TABLET(5 MG) BY MOUTH DAILY [name], MD  Active   amoxicillin (AMOXIL) 500 MG capsule [id number]  Take 500 mg by mouth 3 (  three) times daily. [provider]  Active   aspirin EC 81 MG tablet [id number] No Take 81 mg by mouth daily. Swallow whole. [provider] Taking Active Self  atorvastatin (LIPITOR) 80 MG tablet [id number]  Take 1 tablet (80 mg total) by mouth daily. [name], MD  Active   Blood Glucose Monitoring Suppl (ACCU-CHEK GUIDE ME) w/Device KIT [id number]  Check blood sugars 2 times daily Philemon Kingdom, MD  Active   ezetimibe (ZETIA) 10 MG tablet [id number] No Take 1 tablet (10 mg total) by mouth daily. Irene Pap N, DO Taking Expired 01/21/22 2359   glucose blood (ACCU-CHEK GUIDE) test strip [id number]  Check blood sugars 2 times daily Philemon Kingdom, MD  Active   glucose blood test strip [id number] No 1 each by Other route 2 (two) times daily. And lancets 2/day Renato Shin, MD Taking Active Self  insulin glargine (LANTUS SOLOSTAR) 100 UNIT/ML Solostar Pen [id number] No Inject 25 Units into the skin at bedtime. And pen needles 1/day  Patient taking differently: Inject 30 Units into the skin at bedtime. And pen needles 1/day   Kayleen Memos, DO Taking Active   Iron, Ferrous Sulfate, 325 (65 Fe) MG TABS [id number] No Take one daily  Patient taking differently: Take 325 mg by mouth daily. Take one daily   [name], MD Taking  Active Self  losartan (COZAAR) 50 MG tablet [id number]  Take 1 tablet (50 mg total) by mouth daily. [name], MD  Active   magnesium oxide (MAG-OX) 400 (240 Mg) MG tablet [id number] No Take 1 tablet by mouth daily. [provider] Taking Active   metFORMIN (GLUMETZA) 1000 MG (MOD) 24 hr tablet [id number] No Take 1 tablet (1,000 mg total) by mouth daily. Kayleen Memos, DO Taking Expired 01/06/22 2359   metoprolol succinate (TOPROL-XL) 25 MG 24 hr tablet [id number]  Take 0.5 tablets (12.5 mg total) by mouth daily. [name], MD  Active   nitroGLYCERIN (NITROSTAT) 0.4 MG SL tablet [id number] No Place 1 tablet (0.4 mg total) under the tongue every 5 (five) minutes as needed for chest pain. Loel Dubonnet, NP Taking Expired 02/01/22 2359   Study - SOS-AMI - placebo for selatogrel 0 mg/0.5 mL SQ injection for screening use only ([place]) [id number]   Buford Dresser, MD  Active   Study - SOS-AMI - selatogrel 16 mg/0.5 mL or placebo SQ injection ([facility]) [id number] No Inject 16 mg into the skin once. For Investigational Use Only. Inject 0.5 mL subcutaneously into the abdomen or thigh if experiencing symptoms of a heart attack. Call 911 immediately and seek emergency medical support. Please contact [place] with any questions or concerns regarding this medication. Buford Dresser, MD Taking Active Pharmacy Records  Study - SOS-AMI - selatogrel 16 mg/0.5 mL or placebo SQ injection ([facility]) [id number] No Inject 0.5 mLs (16 mg total) into the skin once for 1 dose. For Investigational Use Only. Inject 0.5 mL subcutaneously into the abdomen or thigh if experiencing symptoms of a heart attack. Call 911 immediately and seek emergency medical support. Buford Dresser, MD Taking Active   ticagrelor (BRILINTA) 90 MG TABS tablet [id number] No Take 1 tablet (90 mg total) by mouth 2 (two) times daily. Kayleen Memos, DO Taking Active    vitamin B-12 (CYANOCOBALAMIN) 1000 MCG tablet [id number] No Take 1 tablet (1,000 mcg total) by mouth daily. [name], MD Taking Active Self  Patient Active Problem List   Diagnosis Date Noted   Light-headedness 10/22/2021   Chronic diastolic CHF (congestive heart failure) ([place]) 10/22/2021   NSTEMI (non-ST elevated myocardial infarction) ([place]) 10/21/2021   Right upper quadrant abdominal pain 07/29/2021   Pre-op evaluation 02/09/2021   Elevated uric acid in blood 10/22/2020   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   Iron deficiency 04/18/2020   B12 deficiency 04/18/2020   Anemia 04/17/2020   Urinary retention 04/17/2020   Balanitis 04/04/2020   Hospital discharge follow-up 04/04/2020   Prostatitis 03/28/2020   Hypotension due to hypovolemia 03/27/2020   Hyperkalemia 03/27/2020   Acute kidney injury ([place]) 03/27/2020   Right knee pain 03/07/2020   Acute idiopathic gout of right knee 02/25/2020   Bilateral carotid bruits 12/05/2019   Type 2 diabetes mellitus with diabetic polyneuropathy, with long-term current use of insulin ([place]) 09/05/2019   Diabetes mellitus ([place]) 09/05/2019   Cervical radiculopathy 02/12/2019   Constipation 02/12/2019   DOE (dyspnea on exertion) 02/12/2019   Chest wall pain 02/12/2019   No-show for appointment 11/21/2018   Coronary artery disease involving native heart 07/20/2018   Bronchitis 07/20/2018   Abnormal stress test    Elevated LDL cholesterol level 06/14/2018   Swelling of left foot 08/05/2017   Nonintractable episodic headache 12/01/2016   Cough 08/04/2016   Right lower quadrant abdominal pain 08/04/2016   Abnormal PSA 05/19/2015   OA (osteoarthritis) of knee 05/16/2015   Foot ulcer, right ([place]) 04/15/2014   Loss of vision 04/15/2014   Screening for prostate cancer 11/03/2012   Routine general medical examination at a health care facility 11/03/2012   Arthralgia 11/12/2010   SHOULDER PAIN,  BILATERAL 01/12/2010   TUBULOVILLOUS ADENOMA, 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   HYPERCHOLESTEROLEMIA 07/29/2008   CELLULITIS, FOOT, RIGHT 07/02/2008   EDEMA 07/02/2008   Type 2 diabetes mellitus with hyperglycemia, with long-term current use of insulin ([place]) 03/04/2007   Essential hypertension 03/04/2007    Immunization History  Administered Date(s) Administered   Fluad Quad(high Dose 65+) 05/31/2019   Influenza Split 05/17/2011   Influenza Whole 05/15/2010   Influenza, High Dose Seasonal PF 06/14/2018   Influenza,inj,Quad PF,6+ Mos 06/12/2013   Pneumococcal Conjugate-13 08/04/2016   Pneumococcal Polysaccharide-23 05/16/2015, 03/28/2020   Td 11/23/1997, 07/02/2008   Tdap 09/02/2016    Conditions to be addressed/monitored:  Hypertension, Hyperlipidemia, Diabetes, and History of MI  There are no care plans that you recently modified to display for this patient.     Medication Assistance:  Eric Form obtained through [place] medication [place].  Enrollment ends NA .  Compliance/Adherence/Medication fill history: Care Gaps: Urine Microalbumin  Star-Rating Drugs: Atorvastatin 80 mg: Last filled 11/09/21 for 90-DS  Patient's preferred pharmacy is:  [facility] [place], [address] [place] [address] Phone: [phone] Fax: [phone]  Uses pill box? Yes Pt endorses 100% compliance  We discussed: Current pharmacy is preferred with insurance plan and patient is satisfied with pharmacy services Patient decided to: Continue current medication management strategy  Care Plan and Follow Up Patient Decision:  Patient agrees to Care Plan and Follow-up.  Plan: Telephone follow up appointment with care management team member scheduled for:  03/04/2022 at 3:45 PM  [name], PharmD, Para [place], CPP Clinical Pharmacist Practitioner  [facility]   [phone]  Current Barriers:  Unable to achieve control of diabetes   Pharmacist  Clinical Goal(s):  Patient will achieve control of Diabetes as evidenced by A1c less than 8% through collaboration with PharmD and provider.   Interventions: 1:1 collaboration with [name], MD regarding development and update of comprehensive plan of care as evidenced by provider attestation and co-signature Inter-disciplinary care team collaboration (see longitudinal plan of care) Comprehensive medication review performed; medication list updated in electronic medical record  Heart Failure (Goal: manage symptoms and prevent exacerbations) -Controlled -Last ejection fraction: 55-60% (Date: Mar 2023) -HF type: Diastolic -NYHA Class: I (no actitivty limitation) -Current treatment: Amlodipine 5 mg daily Metoprolol XL 25 mg 1/2 tablet daily   -Medications previously tried: NA  -Current home BP/HR readings: 132-145/65-75  -Endorses swelling in legs, going on since Saturday  -Recommended to continue current medication   Hyperlipidemia: (LDL goal < 55) -Uncontrolled -NSTEMI Mar 2023 -Current treatment: Atorvastatin 80 mg daily  Ezetimibe 10 mg daily  -Current antiplatelet treatment: Aspirin 81 mg daily  Ticagrelor 90 mg twice daily  -Medications previously tried: NA  -Would recommend more aggressive LDL goal given recent NSTEMI.  -Recommended to continue current medication -Recommend rechecking FLP ~June 2023  Diabetes (A1c goal <8%) -Uncontrolled -Current medications:  Humalog 8-12 units twice daily  Lantus 30 units at bedtime  Metformin 1000 mg daily  -Medications previously tried: [place] (unclear) -Current home glucose readings fasting glucose:  post prandial glucose:  -Denies hypoglycemic/hyperglycemic symptoms -Recommended to continue current medication.  Patient Goals/Self-Care Activities Patient will:  - check glucose twice daily, before breakfast and before  supper, document, and provide at future appointments check blood pressure 2-3 times weekly, document, and provide at future appointments  Follow Up Plan: ***

## 2022-06-07 ENCOUNTER — Encounter: Payer: Self-pay | Admitting: Family Medicine

## 2022-06-07 ENCOUNTER — Encounter: Payer: Self-pay | Admitting: Internal Medicine

## 2022-06-08 ENCOUNTER — Other Ambulatory Visit: Payer: Self-pay

## 2022-06-08 ENCOUNTER — Telehealth: Payer: Self-pay | Admitting: *Deleted

## 2022-06-08 DIAGNOSIS — Z006 Encounter for examination for normal comparison and control in clinical research program: Secondary | ICD-10-CM

## 2022-06-08 DIAGNOSIS — I10 Essential (primary) hypertension: Secondary | ICD-10-CM

## 2022-06-08 MED ORDER — AMLODIPINE BESYLATE 5 MG PO TABS: ORAL_TABLET | ORAL | 0 refills | Status: DC

## 2022-06-08 NOTE — Telephone Encounter (Signed)
I called patient's daughter to see if we could set up SOS-AMI study visit to switch out her father's study medication. I spoke to son in law and stated he was taking wife to emergency room. She will call me later.

## 2022-06-11 ENCOUNTER — Telehealth: Payer: Self-pay | Admitting: *Deleted

## 2022-06-11 ENCOUNTER — Telehealth: Payer: Self-pay | Admitting: Family Medicine

## 2022-06-11 ENCOUNTER — Ambulatory Visit: Payer: Medicare Other | Admitting: Family Medicine

## 2022-06-11 DIAGNOSIS — Z006 Encounter for examination for normal comparison and control in clinical research program: Secondary | ICD-10-CM

## 2022-06-11 NOTE — Telephone Encounter (Signed)
PT was a no show 11/17 for an OV with Dr. Ethelene Hal. This is his first, letter sent

## 2022-06-11 NOTE — Telephone Encounter (Signed)
I called patient to try to set up appointment prior to his appointment today. I left message for him to call me back. He needs to bring back his old study medication for SOS -AMI Study and giv ehim new medication.

## 2022-06-22 ENCOUNTER — Telehealth: Payer: Self-pay | Admitting: *Deleted

## 2022-06-22 DIAGNOSIS — Z006 Encounter for examination for normal comparison and control in clinical research program: Secondary | ICD-10-CM

## 2022-06-22 NOTE — Telephone Encounter (Signed)
1st no show, fee waived, letter sent 

## 2022-06-22 NOTE — Telephone Encounter (Signed)
I called patient to set up appointment to do visit 5 for SOS-AMI Study.. We need to change out his study medication. I left message for patient to call me.

## 2022-06-28 ENCOUNTER — Ambulatory Visit: Payer: Medicare Other | Admitting: Internal Medicine

## 2022-06-28 NOTE — Progress Notes (Deleted)
Patient ID: Scott Donaldson, male   DOB: Dec 21, 1950, 71 y.o.   MRN: [id number]  HPI: Scott Donaldson is a 71 y.o.-year-old male, returning for follow-up for DM2, dx in 1990, insulin-dependent since 2010, uncontrolled, with complications (CAD - s/p NSTEMI 09/2021, CHF, history of foot ulcer). Pt. previously saw Dr. Loanne Drilling, last visit 3 months ago. He is here with his daughter who offers info about his blood sugars, diabetic regimen, and PMH.  He is in a research study - with cardiology.  Interim history: No increased urination, blurry vision, nausea, chest pain. He continues to have leg numbness.  Reviewed HbA1c: Lab Results  Component Value Date   HGBA1C 10.3 (A) 03/25/2022   HGBA1C 10.4 (H) 10/22/2021   HGBA1C 10.7 (A) 08/21/2021   HGBA1C 11.4 (A) 02/13/2021   HGBA1C 8.0 (A) 07/29/2020   HGBA1C 8.4 (H) 03/27/2020   HGBA1C 9.3 (H) 12/05/2019   HGBA1C 10.4 (H) 07/23/2019   HGBA1C 9.2 (A) 12/01/2018   HGBA1C 9.1 (A) 09/07/2018   At last he was on: - Metformin ER 500 mg 2x a day, with meals - Lantus 25 units at bedtime He was on Humalog >> stopped  - changed to Lantus.  I suggested to change to: - Metformin ER 1000 mg 2x a day, with meals - Lantus 30 units and move it to bedtime - Humalog 8-12 units 15 min before meals  Pt checks his sugars 1-2x a day and they are: - am: 129, 189-389 - 2h after b'fast: n/c - before lunch: 160-180 - 2h after lunch: n/c - before dinner: 160-180  - 2h after dinner: n/c - bedtime: 200-400s - nighttime: n/c Lowest sugar was 55; he has hypoglycemia awareness at 110.  Highest sugar was HI.  Glucometer: AccuChek guide  Pt's meals are: - Breakfast: eggs, sausage sometimes, grits - Lunch: skips - Dinner: massed potatoes or fries + meat - Snacks: apple pie; drinks sweet tea - reduced intake of sweet drinks after his heart attack  He does not like veggies - usually; seldom salad.  - no CKD, last BUN/creatinine:  Lab Results  Component  Value Date   BUN 16 11/16/2021   BUN 18 10/23/2021   CREATININE 0.86 11/16/2021   CREATININE 0.92 10/23/2021  He is on Losartan 50 mg daily.  -+ HL; last set of lipids: Lab Results  Component Value Date   CHOL 125 10/22/2021   HDL 44 10/22/2021   LDLCALC 66 10/22/2021   LDLDIRECT 72.0 01/22/2021   TRIG 74 10/22/2021   CHOLHDL 2.8 10/22/2021  He is on Lipitor 80 mg daily, Zetia 10 mg daily.  - last eye exam was in 12/2021. No DR.   Daughter contacted me approximately a month ago with leg numbness.  He has a history of foot ulcer but not clear history of peripheral neuropathy.  He has IDA-on iron, B12 deficiency, HTN, GERD, BPH.  ROS: + see HPI  Past Medical History:  Diagnosis Date   Abdominal pain 08/04/2016   Abnormal PSA 05/19/2015   Anemia    Arthralgia 11/12/2010   Benign prostatic hyperplasia with urinary hesitancy 06/14/2018   Cataract    Coronary artery disease    nonobstructive   Diabetes ([place]) 03/04/2007   Qualifier: Diagnosis of  By: Loanne Drilling MD, Sean A    DIABETES MELLITUS, TYPE II 03/04/2007   Elevated LDL cholesterol level 06/14/2018   Foot ulcer, right ([place]) 04/15/2014   GERD (gastroesophageal reflux disease)    HOH (hard of hearing)  both ears no hearing aides   HYPERCHOLESTEROLEMIA 07/29/2008   HYPERTENSION 03/04/2007   Knee pain, left 05/16/2015   Nonintractable episodic headache 12/01/2016   migraine   SHOULDER PAIN, BILATERAL 01/12/2010   Qualifier: Diagnosis of  By: Loanne Drilling MD, Sean A    TUBULOVILLOUS ADENOMA, 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   Qualifier: Diagnosis of  By: Loanne Drilling MD, Jacelyn Pi     Past Surgical History:  Procedure Laterality Date   CARDIAC CATHETERIZATION     COLONOSCOPY  07/2017   CORONARY STENT INTERVENTION N/A 10/22/2021   Procedure: CORONARY STENT INTERVENTION;  Surgeon: Early Osmond, MD;  Location: [facility];  Service: Cardiovascular;  Laterality: N/A;   LEFT HEART CATH AND CORONARY ANGIOGRAPHY N/A 07/12/2018   Procedure: LEFT HEART  CATH AND CORONARY ANGIOGRAPHY;  Surgeon: Wellington Hampshire, MD;  Location: [facility];  Service: Cardiovascular;  Laterality: N/A;   LEFT HEART CATH AND CORONARY ANGIOGRAPHY N/A 10/22/2021   Procedure: LEFT HEART CATH AND CORONARY ANGIOGRAPHY;  Surgeon: Early Osmond, MD;  Location: [facility];  Service: Cardiovascular;  Laterality: N/A;   stress cardiolite  05/30/2003   TRANSURETHRAL RESECTION OF PROSTATE N/A 06/04/2020   Procedure: TRANSURETHRAL RESECTION OF THE PROSTATE (TURP), BIPOLAR;  Surgeon: Ceasar Mons, MD;  Location: [facility];  Service: Urology;  Laterality: N/A;   Social History   Socioeconomic History   Marital status: Single    Spouse name: Not on file   Number of children: 2   Years of education: 10   Highest education level: 10th grade  Occupational History   Occupation: roofer   Occupation: Retired  Tobacco Use   Smoking status: Former    Packs/day: 1.50    Years: 25.00    Total pack years: 37.50    Types: Cigarettes    Quit date: 07/26/1988    Years since quitting: 33.9   Smokeless tobacco: Never  Vaping Use   Vaping Use: Never used  Substance and Sexual Activity   Alcohol use: No   Drug use: No   Sexual activity: Not Currently  Other Topics Concern   Not on file  Social History Narrative   Lives with daughter in a one story home.  Has 2 children.  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  Education: 10th grade.    Social Determinants of Health   Financial Resource Strain: Low Risk  (11/13/2021)   Overall Financial Resource Strain (CARDIA)    Difficulty of Paying Living Expenses: Not hard at all  Food Insecurity: No Food Insecurity (11/13/2021)   Hunger Vital Sign    Worried About Running Out of Food in the Last Year: Never true    Ran Out of Food in the Last Year: Never true  Transportation Needs: No Transportation Needs (11/13/2021)   PRAPARE - Hydrologist (Medical): No    Lack of  Transportation (Non-Medical): No  Physical Activity: Insufficiently Active (11/13/2021)   Exercise Vital Sign    Days of Exercise per Week: 2 days    Minutes of Exercise per Session: 20 min  Stress: No Stress Concern Present (11/13/2021)   [place]    Feeling of Stress : Not at all  Social Connections: Moderately Integrated (11/13/2021)   Social Connection and Isolation Panel [NHANES]    Frequency of Communication with Friends and Family: More than three times a week    Frequency of Social Gatherings  with Friends and Family: More than three times a week    Attends Religious Services: 1 to 4 times per year    Active Member of Clubs or Organizations: Yes    Attends Archivist Meetings: Never    Marital Status: Never married  Intimate Partner Violence: Not At Risk (11/13/2021)   Humiliation, Afraid, Rape, and Kick questionnaire    Fear of Current or Ex-Partner: No    Emotionally Abused: No    Physically Abused: No    Sexually Abused: No    Current Outpatient Medications on File Prior to Visit  Medication Sig Dispense Refill   Accu-Chek Softclix Lancets lancets Check blood sugar 2 times daily 100 each 12   amLODipine (NORVASC) 5 MG tablet TAKE 1 TABLET(5 MG) BY MOUTH DAILY 90 tablet 0   amoxicillin (AMOXIL) 500 MG capsule Take 500 mg by mouth 3 (three) times daily.     aspirin EC 81 MG tablet Take 81 mg by mouth daily. Swallow whole.     atorvastatin (LIPITOR) 80 MG tablet TAKE 1 TABLET(80 MG) BY MOUTH DAILY. 90 tablet 0   Blood Glucose Monitoring Suppl (ACCU-CHEK GUIDE ME) w/Device KIT Check blood sugars 2 times daily 1 kit 0   ezetimibe (ZETIA) 10 MG tablet Take 1 tablet (10 mg total) by mouth daily. 90 tablet 0   glucose blood (ACCU-CHEK GUIDE) test strip Check blood sugars 2 times daily 100 each 12   glucose blood test strip 1 each by Other route 2 (two) times daily. And lancets 2/day 200 each 3   insulin  glargine (LANTUS SOLOSTAR) 100 UNIT/ML Solostar Pen Inject 30 Units into the skin at bedtime. And pen needles 1/day 30 mL 3   insulin lispro (HUMALOG KWIKPEN) 100 UNIT/ML KwikPen Inject 8-12 Units into the skin 2 (two) times daily before a meal. 30 mL 3   Iron, Ferrous Sulfate, 325 (65 Fe) MG TABS Take one daily (Patient taking differently: Take 325 mg by mouth daily. Take one daily) 90 tablet 4   losartan (COZAAR) 50 MG tablet Take 1 tablet (50 mg total) by mouth daily. 90 tablet 1   magnesium oxide (MAG-OX) 400 (240 Mg) MG tablet Take 1 tablet by mouth daily.     metFORMIN (GLUMETZA) 1000 MG (MOD) 24 hr tablet Take 1 tablet (1,000 mg total) by mouth daily. 30 tablet 0   metoprolol succinate (TOPROL-XL) 25 MG 24 hr tablet Take 0.5 tablets (12.5 mg total) by mouth daily. 45 tablet 0   nitroGLYCERIN (NITROSTAT) 0.4 MG SL tablet Place 1 tablet (0.4 mg total) under the tongue every 5 (five) minutes as needed for chest pain. 25 tablet 3   Study - SOS-AMI - selatogrel 16 mg/0.5 mL or placebo SQ injection (PI-[facility]) Inject 16 mg into the skin once. For Investigational Use Only. Inject 0.5 mL subcutaneously into the abdomen or thigh if experiencing symptoms of a heart attack. Call 911 immediately and seek emergency medical support. Please contact [place] with any questions or concerns regarding this medication.     Study - SOS-AMI - selatogrel 16 mg/0.5 mL or placebo SQ injection (PI-Christopher) Inject 0.5 mLs (16 mg total) into the skin once for 1 dose. For Investigational Use Only. Inject 0.5 mL subcutaneously into the abdomen or thigh if experiencing symptoms of a heart attack. Call 911 immediately and seek emergency medical support. 1 mL 0   ticagrelor (BRILINTA) 90 MG TABS tablet Take 1 tablet (90 mg total) by mouth 2 (two) times  daily. 720 tablet 0   vitamin B-12 (CYANOCOBALAMIN) 1000 MCG tablet Take 1 tablet (1,000 mcg total) by mouth daily. 90 tablet 1   Current  Facility-Administered Medications on File Prior to Visit  Medication Dose Route Frequency Provider Last Rate Last Admin   Study - SOS-AMI - placebo for selatogrel 0 mg/0.5 mL SQ injection for screening use only (PI-Christopher)  0.5 mL Subcutaneous Once Buford Dresser, MD        No Known Allergies  Family History  Problem Relation Age of Onset   Cancer Mother        Breast Cancer   Cancer Father        uncertain type   Diabetes Father    Cancer Brother 72       Colon Cancer   Colon cancer Neg Hx    Rectal cancer Neg Hx    Stomach cancer Neg Hx    PE: There were no vitals taken for this visit. Wt Readings from Last 3 Encounters:  03/25/22 184 lb 3.2 oz (83.6 kg)  01/06/22 186 lb 6.4 oz (84.6 kg)  12/29/21 185 lb 3 oz (84 kg)   Constitutional: overweight, in NAD Eyes: no exophthalmos ENT:  no thyromegaly, no cervical lymphadenopathy Cardiovascular: RRR, No MRG Respiratory: CTA B Musculoskeletal: no deformities Skin: no rashes Neurological: no tremor with outstretched hands  ASSESSMENT: 1. DM2, insulin-dependent, uncontrolled, with complications - CAD - s/p NSTEMI 09/2021 - CHF - history of foot ulcer  2. HL  PLAN:  1. Patient with longstanding, old diabetes, on oral antidiabetic regimen with metformin and also basal bolus insulin regimen (with the dose of Lantus increased at last visit and with Humalog added at that time).  At last visit, HbA1c was 10.3%, still very high.  He was mostly checking blood sugars in the morning and they were above goal, up to 300s.  He was only checking later in the day when he was feeling poorly and his sugars were above goal.  We discussed about the need to add mealtime insulin.  An SGLT2 inhibitor would have been a good addition due to his history of CHF but I did not feel this was safe to add at last visit due to his glucotoxicity.  He does use the patient assistance program for his heart medication so we may need to go through these  programs.  I did advise the daughter to call the insurance and find out which supplier they were working with so we can start the CGM.  I suggested to stop sweet tea, which he already started to cut down after his heart attack 5 months prior to our last appointment, but did not stop completely.  - I suggested to:  Patient Instructions  Please continue: - Metformin ER 1000 mg 2x a day, with meals - Lantus 30 units and move it to bedtime - Humalog 8-12 units 15 min before meals    Stop Sweet Tea!!!  Please return in 3 months with your sugar log.   - we checked his HbA1c: 7%  - advised to check sugars at different times of the day - 4x a day, rotating check times - advised for yearly eye exams >> he is UTD - return to clinic in 3 months  2. HL -Reviewed latest lipid panel from 09/2021: LDL above our goal of less than 55 due to history of cardiovascular disease, the rest the fractions at goal: Lab Results  Component Value Date   CHOL 125 10/22/2021  HDL 44 10/22/2021   LDLCALC 66 10/22/2021   LDLDIRECT 72.0 01/22/2021   TRIG 74 10/22/2021   CHOLHDL 2.8 10/22/2021  -He is on Lipitor 80 mg daily and Zetia 10 mg daily without side effects-we will continue these  Philemon Kingdom, MD PhD [facility] Endocr[facility]

## 2022-06-29 ENCOUNTER — Telehealth: Payer: Self-pay | Admitting: *Deleted

## 2022-06-29 DIAGNOSIS — Z006 Encounter for examination for normal comparison and control in clinical research program: Secondary | ICD-10-CM

## 2022-06-29 NOTE — Telephone Encounter (Signed)
I called patient's daughter Fotios Amos to try to reach out to the patient. I have been unable to reach patient to change out his study medications. Patient has the option to drop out of study also but we need to get old study medications back.

## 2022-07-21 ENCOUNTER — Telehealth: Payer: Self-pay | Admitting: *Deleted

## 2022-07-21 DIAGNOSIS — Z006 Encounter for examination for normal comparison and control in clinical research program: Secondary | ICD-10-CM

## 2022-07-21 NOTE — Telephone Encounter (Signed)
I called patient to let him know SOS-AMI study medication is expiring Dec. 31,2023 I was unable to reach daughter or patient.

## 2022-07-22 ENCOUNTER — Telehealth: Payer: Medicare Other

## 2022-07-22 ENCOUNTER — Telehealth: Payer: Self-pay

## 2022-07-22 NOTE — Telephone Encounter (Signed)
  Care Management   Follow Up Note   07/22/2022 Name: Scott Donaldson MRN: [id number] DOB: Oct 29, 1950   Referred by: [name], MD Reason for referral : No chief complaint on file.   A second unsuccessful telephone outreach was attempted today. The patient was referred to the case management team for assistance with care management and care coordination.   Follow Up Plan:  Unable to reach patient and unable to leave voicemail due to busy/invalid phone number. Will retry.   [name], PharmD, [place], CPP Clinical Pharmacist Practitioner  [facility]  [phone]

## 2022-07-22 NOTE — Progress Notes (Deleted)
Chronic Care Management Pharmacy Note  07/22/2022 Name:  MEREDITH MELLS MRN:  [id number] DOB:  September 03, 1950  Summary: Patient presents for CCM follow-up. Today's visit was 100% conducted with patient's daughter Ellison Hughs.   Recommendations/Changes made from today's visit:   Plan: CPP follow-up 3 months  Recommended Problem List Changes:  Add: Hyperlipidemia associated with type 2 diabetes mellitus   Subjective: CLEO VILLAMIZAR is an 71 y.o. year old male who is a primary patient of Ethelene Hal, Mortimer Fries, MD.  The CCM team was consulted for assistance with disease management and care coordination needs.    Engaged with patient by telephone for follow up visit in response to provider referral for pharmacy case management and/or care coordination services.   Consent to Services:  The patient was given information about Chronic Care Management services, agreed to services, and gave verbal consent prior to initiation of services.  Please see initial visit note for detailed documentation.   Patient Care Team: [name], MD as PCP - General (Family Medicine) Josue Hector, MD as PCP - Cardiology (Cardiology) Calvert Cantor, MD as Consulting Physician (Ophthalmology) Germaine Pomfret, [facility] as Pharmacist (Pharmacist)  Recent office visits: 01/06/22: Patient presented to Dr. Ethelene Hal for follow-up.   Recent consult visits: 03/25/22: Patient presented to Dr. Cruzita Lederer (Endocrinology)   Hospital visits: 10/21/21: Patient admitted for NSTEMI.    Objective:  Lab Results  Component Value Date   CREATININE 0.86 11/16/2021   BUN 16 11/16/2021   GFR 87.11 11/16/2021   GFRNONAA >60 10/23/2021   GFRAA >60 04/06/2020   NA 140 11/16/2021   K 3.6 11/16/2021   CALCIUM 9.0 11/16/2021   CO2 23 11/16/2021   GLUCOSE 181 (H) 11/16/2021    Lab Results  Component Value Date/Time   HGBA1C 10.3 (A) 03/25/2022 04:33 PM   HGBA1C 10.4 (H) 10/22/2021 12:56 AM   HGBA1C 10.7 (A)  08/21/2021 08:33 AM   HGBA1C 8.4 (H) 03/27/2020 11:15 PM   GFR 87.11 11/16/2021 12:03 PM   GFR 80.70 07/29/2021 12:04 PM   MICROALBUR 40.5 (H) 12/05/2019 11:17 AM   MICROALBUR 37.8 (H) 07/23/2019 08:59 AM    Last diabetic Eye exam:  Lab Results  Component Value Date/Time   HMDIABEYEEXA No Retinopathy 12/25/2021 12:00 AM    Last diabetic Foot exam: No results found for: "HMDIABFOOTEX"   Lab Results  Component Value Date   CHOL 125 10/22/2021   HDL 44 10/22/2021   LDLCALC 66 10/22/2021   LDLDIRECT 72.0 01/22/2021   TRIG 74 10/22/2021   CHOLHDL 2.8 10/22/2021       Latest Ref Rng & Units 10/22/2021    1:00 AM 10/21/2021    4:01 PM 07/29/2021   12:04 PM  Hepatic Function  Total Protein 6.5 - 8.1 g/dL 6.5  7.2  7.2   Albumin 3.5 - 5.0 g/dL 3.6  4.0  4.3   AST 15 - 41 U/L 17  19  15    ALT 0 - 44 U/L 18  17  13    Alk Phosphatase 38 - 126 U/L 54  76  62   Total Bilirubin 0.3 - 1.2 mg/dL 0.6  0.6  0.7   Bilirubin, Direct 0.0 - 0.3 mg/dL   0.1     Lab Results  Component Value Date/Time   TSH 2.01 11/16/2021 12:03 PM   TSH 4.528 (H) 10/22/2021 12:56 AM   TSH 1.18 08/04/2016 08:32 AM       Latest Ref Rng & Units 11/16/2021  12:03 PM 10/22/2021    1:00 AM 10/21/2021    4:01 PM  CBC  WBC 4.0 - 10.5 K/uL 6.5  5.3  6.5   Hemoglobin 13.0 - 17.0 g/dL 13.2  12.6  13.3   Hematocrit 39.0 - 52.0 % 38.7  35.5  37.9   Platelets 150.0 - 400.0 K/uL 164.0  160  187     No results found for: "VD25OH"  Clinical ASCVD: Yes  The ASCVD Risk score (Arnett DK, et al., 2019) failed to calculate for the following reasons:   The patient has a prior MI or stroke diagnosis       01/06/2022    9:05 AM 12/29/2021   12:29 PM 11/16/2021   11:28 AM  Depression screen PHQ 2/9  Decreased Interest 0 0 0  Down, Depressed, Hopeless 0 0 0  PHQ - 2 Score 0 0 0     Social History   Tobacco Use  Smoking Status Former   Packs/day: 1.50   Years: 25.00   Total pack years: 37.50   Types: Cigarettes    Quit date: 07/26/1988   Years since quitting: 34.0  Smokeless Tobacco Never   BP Readings from Last 3 Encounters:  03/25/22 120/70  01/06/22 (!) 172/70  12/29/21 124/78   Pulse Readings from Last 3 Encounters:  03/25/22 69  01/06/22 61  12/29/21 73   Wt Readings from Last 3 Encounters:  03/25/22 184 lb 3.2 oz (83.6 kg)  01/06/22 186 lb 6.4 oz (84.6 kg)  12/29/21 185 lb 3 oz (84 kg)   BMI Readings from Last 3 Encounters:  03/25/22 26.43 kg/m  01/06/22 26.75 kg/m  12/29/21 26.01 kg/m    Assessment/Interventions: Review of patient past medical history, allergies, medications, health status, including review of consultants reports, laboratory and other test data, was performed as part of comprehensive evaluation and provision of chronic care management services.   SDOH:  (Social Determinants of Health) assessments and interventions performed: Yes SDOH Interventions    Flowsheet Row Clinical Support from 11/13/2021 in [place] Interventions Intervention Not Indicated  Housing Interventions Intervention Not Indicated  Transportation Interventions Intervention Not Indicated  Financial Strain Interventions Intervention Not Indicated  Physical Activity Interventions Other (Comments)  [Encouraged chair exercises.]  Stress Interventions Intervention Not Indicated  Social Connections Interventions Intervention Not Indicated      SDOH Screenings   Food Insecurity: No Food Insecurity (11/13/2021)  Housing: Low Risk  (11/13/2021)  Transportation Needs: No Transportation Needs (11/13/2021)  Alcohol Screen: Low Risk  (11/13/2021)  Depression (PHQ2-9): Low Risk  (01/06/2022)  Financial Resource Strain: Low Risk  (11/13/2021)  Physical Activity: Insufficiently Active (11/13/2021)  Social Connections: Moderately Integrated (11/13/2021)  Stress: No Stress Concern Present (11/13/2021)  Tobacco Use: Medium Risk (03/25/2022)    CCM  Care Plan  No Known Allergies  Medications Reviewed Today     Reviewed by Philemon Kingdom, MD (Physician) on 03/25/22 at 1618  Med List Status: <None>   Medication Order Taking? Sig Documenting Provider Last Dose Status Informant  Accu-Chek Softclix Lancets lancets [id number]  Check blood sugar 2 times daily Philemon Kingdom, MD  Active   amLODipine (NORVASC) 5 MG tablet [id number]  TAKE 1 TABLET(5 MG) BY MOUTH DAILY [name], MD  Active   amoxicillin (AMOXIL) 500 MG capsule [id number]  Take 500 mg by mouth 3 (three) times daily. [provider]  Active   aspirin EC 81 MG tablet [id number] No  Take 81 mg by mouth daily. Swallow whole. [provider] Taking Active Self  atorvastatin (LIPITOR) 80 MG tablet [id number]  Take 1 tablet (80 mg total) by mouth daily. [name], MD  Active   Blood Glucose Monitoring Suppl (ACCU-CHEK GUIDE ME) w/Device KIT [id number]  Check blood sugars 2 times daily Philemon Kingdom, MD  Active   ezetimibe (ZETIA) 10 MG tablet [id number] No Take 1 tablet (10 mg total) by mouth daily. Irene Pap N, DO Taking Expired 01/21/22 2359   glucose blood (ACCU-CHEK GUIDE) test strip [id number]  Check blood sugars 2 times daily Philemon Kingdom, MD  Active   glucose blood test strip [id number] No 1 each by Other route 2 (two) times daily. And lancets 2/day Renato Shin, MD Taking Active Self  insulin glargine (LANTUS SOLOSTAR) 100 UNIT/ML Solostar Pen [id number] No Inject 25 Units into the skin at bedtime. And pen needles 1/day  Patient taking differently: Inject 30 Units into the skin at bedtime. And pen needles 1/day   Kayleen Memos, DO Taking Active   Iron, Ferrous Sulfate, 325 (65 Fe) MG TABS [id number] No Take one daily  Patient taking differently: Take 325 mg by mouth daily. Take one daily   [name], MD Taking Active Self  losartan (COZAAR) 50 MG tablet [id number]  Take 1 tablet (50 mg total) by mouth daily. [name], MD  Active   magnesium oxide (MAG-OX) 400 (240 Mg) MG tablet [id number] No Take 1 tablet by mouth daily. [provider] Taking Active   metFORMIN (GLUMETZA) 1000 MG (MOD) 24 hr tablet [id number] No Take 1 tablet (1,000 mg total) by mouth daily. Kayleen Memos, DO Taking Expired 01/06/22 2359   metoprolol succinate (TOPROL-XL) 25 MG 24 hr tablet [id number]  Take 0.5 tablets (12.5 mg total) by mouth daily. [name], MD  Active   nitroGLYCERIN (NITROSTAT) 0.4 MG SL tablet [id number] No Place 1 tablet (0.4 mg total) under the tongue every 5 (five) minutes as needed for chest pain. Loel Dubonnet, NP Taking Expired 02/01/22 2359   Study - SOS-AMI - placebo for selatogrel 0 mg/0.5 mL SQ injection for screening use only ([facility]) [id number]   Buford Dresser, MD  Active   Study - SOS-AMI - selatogrel 16 mg/0.5 mL or placebo SQ injection ([facility]) [id number] No Inject 16 mg into the skin once. For Investigational Use Only. Inject 0.5 mL subcutaneously into the abdomen or thigh if experiencing symptoms of a heart attack. Call 911 immediately and seek emergency medical support. Please contact [place] with any questions or concerns regarding this medication. Buford Dresser, MD Taking Active Pharmacy Records  Study - SOS-AMI - selatogrel 16 mg/0.5 mL or placebo SQ injection ([facility]) [id number] No Inject 0.5 mLs (16 mg total) into the skin once for 1 dose. For Investigational Use Only. Inject 0.5 mL subcutaneously into the abdomen or thigh if experiencing symptoms of a heart attack. Call 911 immediately and seek emergency medical support. Buford Dresser, MD Taking Active   ticagrelor (BRILINTA) 90 MG TABS tablet [id number] No Take 1 tablet (90 mg total) by mouth 2 (two) times daily. Kayleen Memos, DO Taking Active   vitamin B-12 (CYANOCOBALAMIN) 1000 MCG tablet [id number] No Take 1 tablet (1,000 mcg total) by mouth daily.  [name], MD Taking Active Self            Patient Active Problem List   Diagnosis Date Noted   Light-headedness 10/22/2021   Chronic  diastolic CHF (congestive heart failure) ([place]) 10/22/2021   NSTEMI (non-ST elevated myocardial infarction) ([place]) 10/21/2021   Right upper quadrant abdominal pain 07/29/2021   Pre-op evaluation 02/09/2021   Elevated uric acid in blood 10/22/2020   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   Iron deficiency 04/18/2020   B12 deficiency 04/18/2020   Anemia 04/17/2020   Urinary retention 04/17/2020   Balanitis 04/04/2020   Hospital discharge follow-up 04/04/2020   Prostatitis 03/28/2020   Hypotension due to hypovolemia 03/27/2020   Hyperkalemia 03/27/2020   Acute kidney injury ([place]) 03/27/2020   Right knee pain 03/07/2020   Acute idiopathic gout of right knee 02/25/2020   Bilateral carotid bruits 12/05/2019   Type 2 diabetes mellitus with diabetic polyneuropathy, with long-term current use of insulin ([place]) 09/05/2019   Diabetes mellitus ([place]) 09/05/2019   Cervical radiculopathy 02/12/2019   Constipation 02/12/2019   DOE (dyspnea on exertion) 02/12/2019   Chest wall pain 02/12/2019   No-show for appointment 11/21/2018   Coronary artery disease involving native heart 07/20/2018   Bronchitis 07/20/2018   Abnormal stress test    Elevated LDL cholesterol level 06/14/2018   Swelling of left foot 08/05/2017   Nonintractable episodic headache 12/01/2016   Cough 08/04/2016   Right lower quadrant abdominal pain 08/04/2016   Abnormal PSA 05/19/2015   OA (osteoarthritis) of knee 05/16/2015   Foot ulcer, right ([place]) 04/15/2014   Loss of vision 04/15/2014   Screening for prostate cancer 11/03/2012   Routine general medical examination at a health care facility 11/03/2012   Arthralgia 11/12/2010   SHOULDER PAIN, BILATERAL 01/12/2010   TUBULOVILLOUS ADENOMA, 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   HYPERCHOLESTEROLEMIA 07/29/2008    CELLULITIS, FOOT, RIGHT 07/02/2008   EDEMA 07/02/2008   Type 2 diabetes mellitus with hyperglycemia, with long-term current use of insulin ([place]) 03/04/2007   Essential hypertension 03/04/2007    Immunization History  Administered Date(s) Administered   Fluad Quad(high Dose 65+) 05/31/2019   Influenza Split 05/17/2011   Influenza Whole 05/15/2010   Influenza, High Dose Seasonal PF 06/14/2018   Influenza,inj,Quad PF,6+ Mos 06/12/2013   Pneumococcal Conjugate-13 08/04/2016   Pneumococcal Polysaccharide-23 05/16/2015, 03/28/2020   Td 11/23/1997, 07/02/2008   Tdap 09/02/2016    Conditions to be addressed/monitored:  Hypertension, Hyperlipidemia, Diabetes, and History of MI  There are no care plans that you recently modified to display for this patient.     Medication Assistance:  Eric Form obtained through [place] medication [place].  Enrollment ends NA .  Compliance/Adherence/Medication fill history: Care Gaps: Urine Microalbumin  Star-Rating Drugs: Atorvastatin 80 mg: Last filled 11/09/21 for 90-DS  Patient's preferred pharmacy is:  [facility] [address], [address] [place] [address] Phone: [phone] Fax: [phone]  Uses pill box? Yes Pt endorses 100% compliance  We discussed: Current pharmacy is preferred with insurance plan and patient is satisfied with pharmacy services Patient decided to: Continue current medication management strategy  Care Plan and Follow Up Patient Decision:  Patient agrees to Care Plan and Follow-up.  Plan: Telephone follow up appointment with care management team member scheduled for:  03/04/2022 at 3:45 PM  [name], PharmD, Para [place], CPP Clinical Pharmacist Practitioner  [facility]  [phone]  Current Barriers:  Unable to achieve control of diabetes   Pharmacist Clinical Goal(s):   Patient will achieve control of Diabetes as evidenced by A1c less than 8%  through collaboration with PharmD and provider.   Interventions: 1:1 collaboration with [name], MD regarding development and update of comprehensive plan of care as evidenced by provider attestation and co-signature Inter-disciplinary care team collaboration (see longitudinal plan of care) Comprehensive medication review performed; medication list updated in electronic medical record  Heart Failure (Goal: manage symptoms and prevent exacerbations) -Controlled -Last ejection fraction: 55-60% (Date: Mar 2023) -HF type: Diastolic -NYHA Class: I (no actitivty limitation) -Current treatment: Amlodipine 5 mg daily Metoprolol XL 25 mg 1/2 tablet daily   -Medications previously tried: NA  -Current home BP/HR readings: 132-145/65-75  -Endorses swelling in legs, going on since Saturday  -Recommended to continue current medication   Hyperlipidemia: (LDL goal < 55) -Uncontrolled -NSTEMI Mar 2023 -Current treatment: Atorvastatin 80 mg daily  Ezetimibe 10 mg daily  -Current antiplatelet treatment: Aspirin 81 mg daily  Ticagrelor 90 mg twice daily  -Medications previously tried: NA  -Would recommend more aggressive LDL goal given recent NSTEMI.  -Recommended to continue current medication -Recommend rechecking FLP ~June 2023  Diabetes (A1c goal <8%) -Uncontrolled -Current medications: Farxiga 10 mg daily  Lantus 25 units daily  Metformin 1000 mg daily  -Medications previously tried: NA  -Current home glucose readings fasting glucose: 85-150s post prandial glucose: 140-150s -Denies hypoglycemic/hyperglycemic symptoms -Patient candidate for GLP-1 agonist. Discussed benefits of Ozempic at reducing risk of ASCVD with patient's wife. She will discuss at next endocrinology follow-up.  -Patient candidate for CGM given insulin use. Can be dispensed at any pharmacy at no cost. Patient to discuss at  endocrinology follow-up.  -Recommended to continue current medication.  Patient Goals/Self-Care Activities Patient will:  - check glucose twice daily, before breakfast and before supper, document, and provide at future appointments check blood pressure 2-3 times weekly, document, and provide at future appointments  Follow Up Plan: ***

## 2022-08-31 ENCOUNTER — Telehealth: Payer: Self-pay | Admitting: Family Medicine

## 2022-08-31 DIAGNOSIS — I10 Essential (primary) hypertension: Secondary | ICD-10-CM

## 2022-08-31 MED ORDER — LOSARTAN POTASSIUM 50 MG PO TABS: 50.0000 mg | ORAL_TABLET | Freq: Every day | ORAL | 1 refills | Status: DC

## 2022-08-31 MED ORDER — METOPROLOL SUCCINATE ER 25 MG PO TB24: 12.5000 mg | ORAL_TABLET | Freq: Every day | ORAL | 0 refills | Status: DC

## 2022-08-31 MED ORDER — AMLODIPINE BESYLATE 5 MG PO TABS: ORAL_TABLET | ORAL | 0 refills | Status: DC

## 2022-08-31 NOTE — Telephone Encounter (Signed)
Caller Name: Scott Donaldson  Call back phone #: [phone]   MEDICATION(S):  Amlodipine Losartan Metoprolol  Days of Med Remaining: urgent   Scott Donaldson also needs a call back, she thinks there is another med coming up for refill but can't remember the name and she can't log in to Mychart

## 2022-09-10 NOTE — Research (Signed)
Open in error

## 2022-09-15 ENCOUNTER — Telehealth: Payer: Self-pay | Admitting: *Deleted

## 2022-09-15 DIAGNOSIS — Z006 Encounter for examination for normal comparison and control in clinical research program: Secondary | ICD-10-CM

## 2022-09-15 NOTE — Telephone Encounter (Signed)
I tried to call patient today with new phone number [phone]. I left message for patient to call me back. Patient study medication has expired and we need to replace study medication.

## 2022-10-08 ENCOUNTER — Ambulatory Visit: Payer: Medicare Other | Admitting: Internal Medicine

## 2022-10-08 ENCOUNTER — Encounter: Payer: Self-pay | Admitting: Internal Medicine

## 2022-10-08 VITALS — BP 108/62 | HR 65 | Ht 70.0 in | Wt 177.0 lb

## 2022-10-08 DIAGNOSIS — E1159 Type 2 diabetes mellitus with other circulatory complications: Secondary | ICD-10-CM | POA: Diagnosis not present

## 2022-10-08 DIAGNOSIS — E1165 Type 2 diabetes mellitus with hyperglycemia: Secondary | ICD-10-CM | POA: Diagnosis not present

## 2022-10-08 DIAGNOSIS — E785 Hyperlipidemia, unspecified: Secondary | ICD-10-CM | POA: Diagnosis not present

## 2022-10-08 LAB — POCT GLYCOSYLATED HEMOGLOBIN (HGB A1C): Hemoglobin A1C: 10.4 % — AB (ref 4.0–5.6)

## 2022-10-08 MED ORDER — LANTUS SOLOSTAR 100 UNIT/ML ~~LOC~~ SOPN: 30.0000 [IU] | PEN_INJECTOR | Freq: Every day | SUBCUTANEOUS | 3 refills | Status: DC

## 2022-10-08 MED ORDER — METFORMIN HCL ER 500 MG PO TB24: 500.0000 mg | ORAL_TABLET | Freq: Every day | ORAL | 3 refills | Status: AC

## 2022-10-08 MED ORDER — LYUMJEV KWIKPEN 100 UNIT/ML ~~LOC~~ SOPN: 8.0000 [IU] | PEN_INJECTOR | Freq: Three times a day (TID) | SUBCUTANEOUS | 3 refills | Status: DC

## 2022-10-08 NOTE — Patient Instructions (Addendum)
Please continue: - Metformin ER 1000 mg 2x a day, with meals - Lantus 30 units at bedtime  Start: - Lyumjev 8-12 units right before meals/Humalog 8-12 units 15 min before meals    Stop Sweet Tea!!!  Please return in 3 months with your sugar log.

## 2022-10-08 NOTE — Progress Notes (Signed)
Patient ID: Scott Donaldson, male   DOB: [birth year], 72 y.o.   MRN: [id number]  HPI: Scott Donaldson is a 72 y.o.-year-old male, returning for follow-up for DM2, dx in 1990, insulin-dependent since 2010, uncontrolled, with complications (CAD - s/p NSTEMI 09/2021, CHF, PN, history of foot ulcer). Pt. previously saw Dr. Loanne Drilling, but last visit with me 6.5 months ago.[name] He is here with his son who offers info about his blood sugars, diabetic regimen, and PMH.  Interim history: He is in a research study - with cardiology. He needs a knee replacement. Needs a  lower HbA1c for the surgery. No increased urination, blurry vision, nausea, chest pain. Needs  atooth extraction. He reduced sweet tea since last OV.  Reviewed HbA1c: Lab Results  Component Value Date   HGBA1C 10.3 (A) 03/25/2022   HGBA1C 10.4 (H) 10/22/2021   HGBA1C 10.7 (A) 08/21/2021   HGBA1C 11.4 (A) 02/13/2021   HGBA1C 8.0 (A) 07/29/2020   HGBA1C 8.4 (H) 03/27/2020   HGBA1C 9.3 (H) 12/05/2019   HGBA1C 10.4 (H) 07/23/2019   HGBA1C 9.2 (A) 12/01/2018   HGBA1C 9.1 (A) 09/07/2018   Pt is on a regimen of: - Metformin ER 500 mg 2x a day, with meals - spacing it out - Lantus 25 >> 30 units at bedtime - Humalog 8-12 units 15 min before meals >>  He was on Humalog >> stopped  - changed to Lantus.  Pt checks his sugars 1-2x a day and they are: - am: 129, 189-389 >> 140s-240 - 2h after b'fast: n/c - before lunch: 160-180 (not recently) >> n/c - 2h after lunch: n/c - before dinner: 160-180 (not recently) n/c - 2h after dinner: n/c - bedtime: 200-400s >> <200s-300s - nighttime: n/c Lowest sugar was 55 >> 129 >> 140; he has hypoglycemia awareness at 110.  Highest sugar was HI >> 360.  Glucometer: AccuChek guide  Pt's meals are: - Breakfast: eggs, sausage sometimes, grits - Lunch: skips - Dinner: massed potatoes or fries + meat - Snacks: apple pie; drinks sweet tea - reduced intake of sweet drinks after his heart attack  He  does not like veggies - usually; seldom salad.  - no CKD, last BUN/creatinine:  Lab Results  Component Value Date   BUN 16 11/16/2021   BUN 18 10/23/2021   CREATININE 0.86 11/16/2021   CREATININE 0.92 10/23/2021  He is on Losartan 50 mg daily.  -+ HL; last set of lipids: Lab Results  Component Value Date   CHOL 125 10/22/2021   HDL 44 10/22/2021   LDLCALC 66 10/22/2021   LDLDIRECT 72.0 01/22/2021   TRIG 74 10/22/2021   CHOLHDL 2.8 10/22/2021  He is on Lipitor 80 mg daily, Zetia 10 mg daily.  - last eye exam was in 12/2021. No DR.   He has IDA-on iron, B12 deficiency, HTN, GERD, BPH.  ROS: + see HPI  Past Medical History:  Diagnosis Date   Abdominal pain 08/04/2016   Abnormal PSA 05/19/2015   Anemia    Arthralgia 11/12/2010   Benign prostatic hyperplasia with urinary hesitancy 06/14/2018   Cataract    Coronary artery disease    nonobstructive   Diabetes ([place]) 03/04/2007   Qualifier: Diagnosis of  By: Loanne Drilling MD, Sean A    DIABETES MELLITUS, TYPE II 03/04/2007   Elevated LDL cholesterol level 06/14/2018   Foot ulcer, right ([place]) 04/15/2014   GERD (gastroesophageal reflux disease)    HOH (hard of hearing)    both ears no  hearing aides   HYPERCHOLESTEROLEMIA 07/29/2008   HYPERTENSION 03/04/2007   Knee pain, left 05/16/2015   Nonintractable episodic headache 12/01/2016   migraine   SHOULDER PAIN, BILATERAL 01/12/2010   Qualifier: Diagnosis of  By: Loanne Drilling MD, Sean A    TUBULOVILLOUS ADENOMA, 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   Qualifier: Diagnosis of  By: Loanne Drilling MD, Jacelyn Pi     Past Surgical History:  Procedure Laterality Date   CARDIAC CATHETERIZATION     COLONOSCOPY  07/2017   CORONARY STENT INTERVENTION N/A 10/22/2021   Procedure: CORONARY STENT INTERVENTION;  Surgeon: Early Osmond, MD;  Location: [facility];  Service: Cardiovascular;  Laterality: N/A;   LEFT HEART CATH AND CORONARY ANGIOGRAPHY N/A 07/12/2018   Procedure: LEFT HEART CATH AND CORONARY ANGIOGRAPHY;  Surgeon:  Wellington Hampshire, MD;  Location: [facility];  Service: Cardiovascular;  Laterality: N/A;   LEFT HEART CATH AND CORONARY ANGIOGRAPHY N/A 10/22/2021   Procedure: LEFT HEART CATH AND CORONARY ANGIOGRAPHY;  Surgeon: Early Osmond, MD;  Location: [facility];  Service: Cardiovascular;  Laterality: N/A;   stress cardiolite  05/30/2003   TRANSURETHRAL RESECTION OF PROSTATE N/A 06/04/2020   Procedure: TRANSURETHRAL RESECTION OF THE PROSTATE (TURP), BIPOLAR;  Surgeon: Ceasar Mons, MD;  [facility];  Service: Urology;  Laterality: N/A;   Social History   Socioeconomic History   Marital status: Single    Spouse name: Not on file   Number of children: 2   Years of education: 10   Highest education level: 10th grade  Occupational History   Occupation: roofer   Occupation: Retired  Tobacco Use   Smoking status: Former    Packs/day: 1.50    Years: 25.00    Additional pack years: 0.00    Total pack years: 37.50    Types: Cigarettes    Quit date: 07/26/1988    Years since quitting: 34.2   Smokeless tobacco: Never  Vaping Use   Vaping Use: Never used  Substance and Sexual Activity   Alcohol use: No   Drug use: No   Sexual activity: Not Currently  Other Topics Concern   Not on file  Social History Narrative   Lives with daughter in a one story home.  Has 2 children.  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  Education: 10th grade.    Social Determinants of Health   Financial Resource Strain: Low Risk  (11/13/2021)   Overall Financial Resource Strain (CARDIA)    Difficulty of Paying Living Expenses: Not hard at all  Food Insecurity: No Food Insecurity (11/13/2021)   Hunger Vital Sign    Worried About Running Out of Food in the Last Year: Never true    Ran Out of Food in the Last Year: Never true  Transportation Needs: No Transportation Needs (11/13/2021)   PRAPARE - Hydrologist (Medical): No    Lack of  Transportation (Non-Medical): No  Physical Activity: Insufficiently Active (11/13/2021)   Exercise Vital Sign    Days of Exercise per Week: 2 days    Minutes of Exercise per Session: 20 min  Stress: No Stress Concern Present (11/13/2021)   [place]    Feeling of Stress : Not at all  Social Connections: Moderately Integrated (11/13/2021)   Social Connection and Isolation Panel [NHANES]    Frequency of Communication with Friends and Family: More than three times a week  Frequency of Social Gatherings with Friends and Family: More than three times a week    Attends Religious Services: 1 to 4 times per year    Active Member of [employer] or Organizations: Yes    Attends Archivist Meetings: Never    Marital Status: Never married  Intimate Partner Violence: Not At Risk (11/13/2021)   Humiliation, Afraid, Rape, and Kick questionnaire    Fear of Current or Ex-Partner: No    Emotionally Abused: No    Physically Abused: No    Sexually Abused: No    Current Outpatient Medications on File Prior to Visit  Medication Sig Dispense Refill   Accu-Chek Softclix Lancets lancets Check blood sugar 2 times daily 100 each 12   amLODipine (NORVASC) 5 MG tablet TAKE 1 TABLET(5 MG) BY MOUTH DAILY 90 tablet 0   amoxicillin (AMOXIL) 500 MG capsule Take 500 mg by mouth 3 (three) times daily.     aspirin EC 81 MG tablet Take 81 mg by mouth daily. Swallow whole.     atorvastatin (LIPITOR) 80 MG tablet TAKE 1 TABLET(80 MG) BY MOUTH DAILY. 90 tablet 0   Blood Glucose Monitoring Suppl (ACCU-CHEK GUIDE ME) w/Device KIT Check blood sugars 2 times daily 1 kit 0   ezetimibe (ZETIA) 10 MG tablet Take 1 tablet (10 mg total) by mouth daily. 90 tablet 0   glucose blood (ACCU-CHEK GUIDE) test strip Check blood sugars 2 times daily 100 each 12   glucose blood test strip 1 each by Other route 2 (two) times daily. And lancets 2/day 200 each 3   insulin  glargine (LANTUS SOLOSTAR) 100 UNIT/ML Solostar Pen Inject 30 Units into the skin at bedtime. And pen needles 1/day 30 mL 3   insulin lispro (HUMALOG KWIKPEN) 100 UNIT/ML KwikPen Inject 8-12 Units into the skin 2 (two) times daily before a meal. 30 mL 3   Iron, Ferrous Sulfate, 325 (65 Fe) MG TABS Take one daily (Patient taking differently: Take 325 mg by mouth daily. Take one daily) 90 tablet 4   losartan (COZAAR) 50 MG tablet Take 1 tablet (50 mg total) by mouth daily. 90 tablet 1   magnesium oxide (MAG-OX) 400 (240 Mg) MG tablet Take 1 tablet by mouth daily.     metFORMIN (GLUMETZA) 1000 MG (MOD) 24 hr tablet Take 1 tablet (1,000 mg total) by mouth daily. 30 tablet 0   metoprolol succinate (TOPROL-XL) 25 MG 24 hr tablet Take 0.5 tablets (12.5 mg total) by mouth daily. 45 tablet 0   nitroGLYCERIN (NITROSTAT) 0.4 MG SL tablet Place 1 tablet (0.4 mg total) under the tongue every 5 (five) minutes as needed for chest pain. 25 tablet 3   Study - SOS-AMI - selatogrel 16 mg/0.5 mL or placebo SQ injection (PI-[facility]) Inject 16 mg into the skin once. For Investigational Use Only. Inject 0.5 mL subcutaneously into the abdomen or thigh if experiencing symptoms of a heart attack. Call 911 immediately and seek emergency medical support. Please contact [place] with any questions or concerns regarding this medication.     Study - SOS-AMI - selatogrel 16 mg/0.5 mL or placebo SQ injection (PI-Christopher) Inject 0.5 mLs (16 mg total) into the skin once for 1 dose. For Investigational Use Only. Inject 0.5 mL subcutaneously into the abdomen or thigh if experiencing symptoms of a heart attack. Call 911 immediately and seek emergency medical support. 1 mL 0   ticagrelor (BRILINTA) 90 MG TABS tablet Take 1 tablet (90 mg total) by  mouth 2 (two) times daily. 720 tablet 0   vitamin B-12 (CYANOCOBALAMIN) 1000 MCG tablet Take 1 tablet (1,000 mcg total) by mouth daily. 90 tablet 1   Current  Facility-Administered Medications on File Prior to Visit  Medication Dose Route Frequency Provider Last Rate Last Admin   Study - SOS-AMI - placebo for selatogrel 0 mg/0.5 mL SQ injection for screening use only (PI-Christopher)  0.5 mL Subcutaneous Once Buford Dresser, MD        No Known Allergies  Family History  Problem Relation Age of Onset   Cancer Mother        Breast Cancer   Cancer Father        uncertain type   Diabetes Father    Cancer Brother 46       Colon Cancer   Colon cancer Neg Hx    Rectal cancer Neg Hx    Stomach cancer Neg Hx    PE: BP 108/62 (BP Location: Left Arm, Patient Position: Sitting, Cuff Size: Normal)   Pulse 65   Ht 5\' 10"  (1.778 m)   Wt 177 lb (80.3 kg)   SpO2 98%   BMI 25.40 kg/m  Wt Readings from Last 3 Encounters:  10/08/22 177 lb (80.3 kg)  03/25/22 184 lb 3.2 oz (83.6 kg)  01/06/22 186 lb 6.4 oz (84.6 kg)   Constitutional: overweight, in NAD Eyes: no exophthalmos ENT:  no thyromegaly, no cervical lymphadenopathy Cardiovascular: RRR, No MRG Respiratory: CTA B Musculoskeletal: no deformities Skin:  no rashes Neurological: no tremor with outstretched hands Diabetic Foot Exam - Simple   Simple Foot Form Diabetic Foot exam was performed with the following findings: Yes 10/08/2022  3:28 PM  Visual Inspection No deformities, no ulcerations, no other skin breakdown bilaterally: Yes Sensation Testing See comments: Yes Pulse Check Posterior Tibialis and Dorsalis pulse intact bilaterally: Yes Comments No sensation to monofilament up to the knees     ASSESSMENT: 1. DM2, insulin-dependent, uncontrolled, with complications - CAD - s/p NSTEMI 09/2021 - CHF - history of foot ulcer  2. HL  PLAN:  1. Patient with longstanding, uncontrolled, type 2 diabetes, on oral antidiabetic regimen with metformin and long-acting insulin. We also increased his Lantus dose at that time and moved it at bedtime.  I also recommended mealtime  insulin.  His HbA1c was 10.3% then.  At that time, he was only checking blood sugars in the morning and they were above goal, up to 300s.  He was only checking later in the day when he was feeling poorly and they were above goal.  We discussed about the absolute need to stop sweet tea and we also added Humalog before each meal.  He was glucotoxicity at that time so we did not start an SGLT2 inhibitor.  He was telling me that he was getting cardiovascular medicines from the patient assistance program.  I also recommended a CGM at last visit. -At today's visit, he tells me that he was not able to obtain Humalog.  I was not aware of this.  He also did not obtain a CGM.  At today's visit, we discussed that it is important to stay in touch with me if he cannot obtain some of the medicines, since we could maybe send the replacement to his pharmacy.  At today's visit, sugars are very high so I advised him to continue metformin (which I refilled since he is running out any spacing out the doses), also continue Lantus and I will try to  add Lyumjev.  I explained this is injected at the start of the meal.  If this is not covered and we end up using NovoLog or Humalog, these are injected 15 minutes before meals.  He agrees with the plan. -He did reduce sweet tea since last visit.  He mentions that he is now only drinking this sporadically. - I suggested to:  Patient Instructions  Please continue: - Metformin ER 1000 mg 2x a day, with meals - Lantus 30 units at bedtime  Start: - Lyumjev 8-12 units right before meals/Humalog 8-12 units 15 min before meals    Stop Sweet Tea!!!  Please return in 3 months with your sugar log.   - we checked his HbA1c: 10.4% (slightly higher) - advised to check sugars at different times of the day - 3-4x a day, rotating check times - advised for yearly eye exams >> he is UTD - return to clinic in 3 months  2. HL -Reviewed latest lipid panel from 09/2021: LDL above goal of less  than 55 due to history of cardiovascular disease, the rest of the fractions at goal: Lab Results  Component Value Date   CHOL 125 10/22/2021   HDL 44 10/22/2021   LDLCALC 66 10/22/2021   LDLDIRECT 72.0 01/22/2021   TRIG 74 10/22/2021   CHOLHDL 2.8 10/22/2021  -Continues on Lipitor 80 mg daily and Zetia 10 mg daily without side effects -He is planning to schedule another appointment with PCP and will have labs done  Philemon Kingdom, MD PhD [facility] Endocrinology

## 2022-11-08 ENCOUNTER — Encounter: Payer: Self-pay | Admitting: *Deleted

## 2022-11-11 ENCOUNTER — Other Ambulatory Visit: Payer: Self-pay | Admitting: Family Medicine

## 2022-11-11 DIAGNOSIS — I10 Essential (primary) hypertension: Secondary | ICD-10-CM

## 2022-11-19 ENCOUNTER — Telehealth: Payer: Self-pay

## 2022-11-19 NOTE — Telephone Encounter (Signed)
This nurse attempted to call patient three times for AWV. Call went straight to message saying that voicemail has not been set up.

## 2022-12-23 ENCOUNTER — Other Ambulatory Visit: Payer: Self-pay | Admitting: Family Medicine

## 2022-12-23 DIAGNOSIS — I10 Essential (primary) hypertension: Secondary | ICD-10-CM

## 2022-12-23 DIAGNOSIS — I2510 Atherosclerotic heart disease of native coronary artery without angina pectoris: Secondary | ICD-10-CM

## 2022-12-23 DIAGNOSIS — E7800 Pure hypercholesterolemia, unspecified: Secondary | ICD-10-CM

## 2023-01-28 ENCOUNTER — Ambulatory Visit: Payer: Medicare Other | Admitting: Internal Medicine

## 2023-01-28 NOTE — Progress Notes (Deleted)
Patient ID: Scott Donaldson, male   DOB: 18-Dec-1950, 72 y.o.   MRN: [id number]  HPI: Scott Donaldson is Donaldson 72 y.o.-year-old male, returning for follow-up for DM2, dx in 1990, insulin-dependent since 2010, uncontrolled, with complications (CAD - s/p NSTEMI 09/2021, CHF, PN, history of foot ulcer). Pt. previously saw Dr. Everardo Donaldson, but last visit with me 4 months ago.  He is here with his son who offers info about his blood sugars, diabetic regimen, and PMH.  Interim history: He is in Donaldson research study - with cardiology. He needs Donaldson knee replacement. Needs Donaldson  lower HbA1c for the surgery. No increased urination, blurry vision, nausea, chest pain.  He is still drinking sweet tea.  Reviewed HbA1c: Lab Results  Component Value Date   HGBA1C 10.4 (Donaldson) 10/08/2022   HGBA1C 10.3 (Donaldson) 03/25/2022   HGBA1C 10.4 (H) 10/22/2021   HGBA1C 10.7 (Donaldson) 08/21/2021   HGBA1C 11.4 (Donaldson) 02/13/2021   HGBA1C 8.0 (Donaldson) 07/29/2020   HGBA1C 8.4 (H) 03/27/2020   HGBA1C 9.3 (H) 12/05/2019   HGBA1C 10.4 (H) 07/23/2019   HGBA1C 9.2 (Donaldson) 12/01/2018   Pt is on Donaldson regimen of: - Metformin ER 500 mg 2x Donaldson day, with meals - spacing it out - Lantus 25 >> 30 units at bedtime - Humalog 8-12 units 15 min before meals >> off >> Lyumjev 8-12 units right before meals/Humalog 8-12 units 15 min before meals He was on Humalog >> stopped  - changed to Lantus.  Pt checks his sugars 1-2x Donaldson day and they are: - am: 129, 189-389 >> 140s-240 - 2h after b'fast: n/c - before lunch: 160-180 (not recently) >> n/c - 2h after lunch: n/c - before dinner: 160-180 (not recently) n/c - 2h after dinner: n/c - bedtime: 200-400s >> <200s-300s - nighttime: n/c Lowest sugar was 55 >> 129 >> 140; he has hypoglycemia awareness at 110.  Highest sugar was HI >> 360.  Glucometer: AccuChek guide  Pt's meals are: - Breakfast: eggs, sausage sometimes, grits - Lunch: skips - Dinner: massed potatoes or fries + meat - Snacks: apple pie; drinks sweet tea -  reduced intake of sweet drinks after his heart attack  He does not like veggies - seldom eats salad.  - no CKD, last BUN/creatinine:  Lab Results  Component Value Date   BUN 16 11/16/2021   BUN 18 10/23/2021   CREATININE 0.86 11/16/2021   CREATININE 0.92 10/23/2021  He is on Losartan 50 mg daily.  -+ HL; last set of lipids: Lab Results  Component Value Date   CHOL 125 10/22/2021   HDL 44 10/22/2021   LDLCALC 66 10/22/2021   LDLDIRECT 72.0 01/22/2021   TRIG 74 10/22/2021   CHOLHDL 2.8 10/22/2021  He is on Lipitor 80 mg daily, Zetia 10 mg daily.  - last eye exam was in 12/2021. No DR.   - last foot exam 10/08/2022.  He has IDA-on iron, B12 deficiency, HTN, GERD, BPH.  ROS: + see HPI  Past Medical History:  Diagnosis Date   Abdominal pain 08/04/2016   Abnormal PSA 05/19/2015   Anemia    Arthralgia 11/12/2010   Benign prostatic hyperplasia with urinary hesitancy 06/14/2018   Cataract    Coronary artery disease    nonobstructive   Diabetes (HCC) 03/04/2007   Qualifier: Diagnosis of  By: Scott All MD, Scott Donaldson    DIABETES MELLITUS, TYPE II 03/04/2007   Elevated LDL cholesterol level 06/14/2018   Foot ulcer, right (HCC) 04/15/2014   GERD (  gastroesophageal reflux disease)    HOH (hard of hearing)    both ears no hearing aides   HYPERCHOLESTEROLEMIA 07/29/2008   HYPERTENSION 03/04/2007   Knee pain, left 05/16/2015   Nonintractable episodic headache 12/01/2016   migraine   SHOULDER PAIN, BILATERAL 01/12/2010   Qualifier: Diagnosis of  By: Scott All MD, Scott Donaldson    TUBULOVILLOUS ADENOMA, 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   Qualifier: Diagnosis of  By: Scott All MD, Scott Donaldson     Past Surgical History:  Procedure Laterality Date   CARDIAC CATHETERIZATION     COLONOSCOPY  07/2017   CORONARY STENT INTERVENTION N/Donaldson 10/22/2021   Procedure: CORONARY STENT INTERVENTION;  Surgeon: Scott Pyo, MD;  Location: [facility];  Service: Cardiovascular;  Laterality: N/Donaldson;   LEFT HEART CATH AND CORONARY  ANGIOGRAPHY N/Donaldson 07/12/2018   Procedure: LEFT HEART CATH AND CORONARY ANGIOGRAPHY;  Surgeon: Scott Ouch, MD;  Location: [facility];  Service: Cardiovascular;  Laterality: N/Donaldson;   LEFT HEART CATH AND CORONARY ANGIOGRAPHY N/Donaldson 10/22/2021   Procedure: LEFT HEART CATH AND CORONARY ANGIOGRAPHY;  Surgeon: Scott Pyo, MD;  Location: [facility];  Service: Cardiovascular;  Laterality: N/Donaldson;   stress cardiolite  05/30/2003   TRANSURETHRAL RESECTION OF PROSTATE N/Donaldson 06/04/2020   Procedure: TRANSURETHRAL RESECTION OF THE PROSTATE (TURP), BIPOLAR;  Surgeon: Scott Paci, MD;  Location: [facility];  Service: Urology;  Laterality: N/Donaldson;   Social History   Socioeconomic History   Marital status: Single    Spouse name: Not on file   Number of children: 2   Years of education: 10   Highest education level: 10th grade  Occupational History   Occupation: roofer   Occupation: Retired  Tobacco Use   Smoking status: Former    Packs/day: 1.50    Years: 25.00    Additional pack years: 0.00    Total pack years: 37.50    Types: Cigarettes    Quit date: 07/26/1988    Years since quitting: 34.5   Smokeless tobacco: Never  Vaping Use   Vaping Use: Never used  Substance and Sexual Activity   Alcohol use: No   Drug use: No   Sexual activity: Not Currently  Other Topics Concern   Not on file  Social History Narrative   Lives with daughter in [place] one story home.  Has 2 children.  Semi-retired.  Works as Donaldson Designer, fashion/clothing.  Education: 10th grade.    Social Determinants of Health   Financial Resource Strain: Low Risk  (11/13/2021)   Overall Financial Resource Strain (CARDIA)    Difficulty of Paying Living Expenses: Not hard at Donaldson  Food Insecurity: No Food Insecurity (11/13/2021)   Hunger Vital Sign    Worried About Running Out of Food in the Last Year: Never true    Ran Out of Food in the Last Year: Never true  Transportation Needs: No Transportation Needs  (11/13/2021)   PRAPARE - Administrator, Civil Service (Medical): No    Lack of Transportation (Non-Medical): No  Physical Activity: Insufficiently Active (11/13/2021)   Exercise Vital Sign    Days of Exercise per Week: 2 days    Minutes of Exercise per Session: 20 min  Stress: No Stress Concern Present (11/13/2021)   [employer] of Occupational Health - Occupational Stress Questionnaire    Feeling of Stress : Not at [place]onaldson  Social Connections: Moderately Integrated (11/13/2021)   Social Connection and Isolation Panel [NHANES]  Frequency of Communication with Friends and Family: More than three times Donaldson week    Frequency of Social Gatherings with Friends and Family: More than three times Donaldson week    Attends Religious Services: 1 to 4 times per year    Active Member of [employer] or Organizations: Yes    Attends Banker Meetings: Never    Marital Status: Never married  Intimate Partner Violence: Not At Risk (11/13/2021)   Humiliation, Afraid, Rape, and Kick questionnaire    Fear of Current or Ex-Partner: No    Emotionally Abused: No    Physically Abused: No    Sexually Abused: No    Current Outpatient Medications on File Prior to Visit  Medication Sig Dispense Refill   Accu-Chek Softclix Lancets lancets Check blood sugar 2 times daily 100 each 12   amLODipine (NORVASC) 5 MG tablet TAKE 1 TABLET(5 MG) BY MOUTH DAILY 90 tablet 0   amoxicillin (AMOXIL) 500 MG capsule Take 500 mg by mouth 3 (three) times daily.     aspirin EC 81 MG tablet Take 81 mg by mouth daily. Swallow whole.     atorvastatin (LIPITOR) 80 MG tablet TAKE 1 TABLET(80 MG) BY MOUTH DAILY. 90 tablet 0   Blood Glucose Monitoring Suppl (ACCU-CHEK GUIDE ME) w/Device KIT Check blood sugars 2 times daily 1 kit 0   ezetimibe (ZETIA) 10 MG tablet Take 1 tablet (10 mg total) by mouth daily. 90 tablet 0   glucose blood (ACCU-CHEK GUIDE) test strip Check blood sugars 2 times daily 100 each 12   glucose blood test  strip 1 each by Other route 2 (two) times daily. And lancets 2/day 200 each 3   insulin glargine (LANTUS SOLOSTAR) 100 UNIT/ML Solostar Pen Inject 30 Units into the skin at bedtime. 30 mL 3   Insulin Lispro-aabc (LYUMJEV KWIKPEN) 100 UNIT/ML KwikPen Inject 8-12 Units into the skin 3 (three) times daily before meals. 30 mL 3   Iron, Ferrous Sulfate, 325 (65 Fe) MG TABS Take one daily (Patient taking differently: Take 325 mg by mouth daily. Take one daily) 90 tablet 4   losartan (COZAAR) 50 MG tablet Take 1 tablet (50 mg total) by mouth daily. 90 tablet 1   magnesium oxide (MAG-OX) 400 (240 Mg) MG tablet Take 1 tablet by mouth daily.     metFORMIN (GLUCOPHAGE-XR) 500 MG 24 hr tablet Take 1 tablet (500 mg total) by mouth daily with breakfast. 180 tablet 3   metoprolol succinate (TOPROL-XL) 25 MG 24 hr tablet TAKE 1/2 TABLET(12.5 MG) BY MOUTH DAILY 45 tablet 0   nitroGLYCERIN (NITROSTAT) 0.4 MG SL tablet Place 1 tablet (0.4 mg total) under the tongue every 5 (five) minutes as needed for chest pain. 25 tablet 3   Study - SOS-AMI - selatogrel 16 mg/0.5 mL or placebo SQ injection (PI-Christopher) Inject 16 mg into the skin once. For Investigational Use Only. Inject 0.5 mL subcutaneously into the abdomen or thigh if experiencing symptoms of Donaldson heart attack. Call 911 immediately and seek emergency medical support. Please contact [facility] with any questions or concerns regarding this medication.     Study - SOS-AMI - selatogrel 16 mg/0.5 mL or placebo SQ injection (PI-Christopher) Inject 0.5 mLs (16 mg total) into the skin once for 1 dose. For Investigational Use Only. Inject 0.5 mL subcutaneously into the abdomen or thigh if experiencing symptoms of Donaldson heart attack. Call 911 immediately and seek emergency medical support. 1 mL 0   vitamin B-12 (CYANOCOBALAMIN)  1000 MCG tablet Take 1 tablet (1,000 mcg total) by mouth daily. 90 tablet 1   Current Facility-Administered Medications on File Prior to  Visit  Medication Dose Route Frequency Provider Last Rate Last Admin   Study - SOS-AMI - placebo for selatogrel 0 mg/0.5 mL SQ injection for screening use only (PI-Christopher)  0.5 mL Subcutaneous Once Jodelle Red, MD        No Known Allergies  Family History  Problem Relation Age of Onset   Cancer Mother        Breast Cancer   Cancer Father        uncertain type   Diabetes Father    Cancer Brother 65       Colon Cancer   Colon cancer Neg Hx    Rectal cancer Neg Hx    Stomach cancer Neg Hx    PE: There were no vitals taken for this visit. Wt Readings from Last 3 Encounters:  10/08/22 177 lb (80.3 kg)  03/25/22 184 lb 3.2 oz (83.6 kg)  01/06/22 186 lb 6.4 oz (84.6 kg)   Constitutional: overweight, in NAD Eyes: no exophthalmos ENT:  no thyromegaly, no cervical lymphadenopathy Cardiovascular: RRR, No MRG Respiratory: CTA B Musculoskeletal: no deformities Skin:  no rashes Neurological: no tremor with outstretched hands  ASSESSMENT: 1. DM2, insulin-dependent, uncontrolled, with complications - CAD - s/p NSTEMI 09/2021 - CHF - history of foot ulcer  2. HL  PLAN:  1. Patient with longstanding, uncontrolled, type 2 diabetes, on oral antidiabetic regimen with metformin, and also basal-bolus insulin regimen, with mealtime insulin started at last visit.  At that time, he was admitted he was not able to obtain the Humalog or Donaldson CGM.  I advised him to stay in touch with me if this happens in the future as I was not aware of this.  Sugars are very high, so I sent Donaldson prescription for Lyumjev to his pharmacy and advised him to let me know if he could not obtain this.  We discussed about the difference in the lag time for injection of Lyumjev versus Humalog.  Also, last visit, I advised him to stop sweet tea completely, as he was telling me that he reduced the amount but did not stop. -We did not start an SGLT2 inhibitor at the previous visit due to glucotoxicity.   -At  today's visit we discussed that due to his CHF, he would be Donaldson good candidate for an SGLT2 inhibitor.  I will try to send this to his pharmacy but need to come through the patient assistance program.  He gets his cardiovascular medicines through [facility]. - I suggested to:  Patient Instructions  Please continue: - Metformin ER 1000 mg 2x Donaldson day, with meals - Lantus 30 units at bedtime - Lyumjev 8-12 units right before meals/Humalog 8-12 units 15 min before meals  Start: - Farxiga 5 mg before b'fast    Stop Sweet Tea!!!  Please return in 3 months with your sugar log.   - we checked his HbA1c: 7%  - advised to check sugars at different times of the day - 4x Donaldson day, rotating check times - advised for yearly eye exams >> he is UTD - return to clinic in 3 months  2. HL -Reviewed latest lipid panel from 09/2021: LDL above goal of less than 55 due to history of cardiovascular disease, the rest of the fractions at goal: Lab Results  Component Value Date   CHOL 125 10/22/2021   HDL  44 10/22/2021   LDLCALC 66 10/22/2021   LDLDIRECT 72.0 01/22/2021   TRIG 74 10/22/2021   CHOLHDL 2.8 10/22/2021  -Continues on Lipitor 80 mg daily and Zetia 10 mg daily without side effects -Will check his lipid panel today  Carlus Pavlov, MD PhD [facility] Endocrinology

## 2023-02-17 ENCOUNTER — Telehealth: Payer: Self-pay | Admitting: Internal Medicine

## 2023-02-17 NOTE — Telephone Encounter (Signed)
Patient's daughter is calling to say that Lantus SoloStar insulin glargine (LANTUS SOLOSTAR) 100 UNIT/ML Solostar Pen And Lyumjev KwikPen Insulin Lispro-aabc (LYUMJEV KWIKPEN) 100 UNIT/ML KwikPen Are too expensive for patient and she needs to know what he can do.  He is almost out of both prescriptions.  He has enough to last for 1 week.  Daughter also states that his blood sugar level has been so high that it will not even been registering.  (Patient does finger sticks) .  Daughter states that at around 11:00 AM yesterday (02/16/2023) that it did register at over 500 and then within 30 minutes later it plummeted to 90 without him doing anything.

## 2023-02-17 NOTE — Telephone Encounter (Signed)
Routing to Dr Lonzo Cloud for advice

## 2023-02-17 NOTE — Telephone Encounter (Signed)
Call attempted to [phone] (call can not be completed at this time good bye) is the message you get.   Call attempted to [phone]. (Left a vm for them to call back) this number is on the most recent DPR.

## 2023-02-17 NOTE — Telephone Encounter (Signed)
I have tried to reach daughter by her cell and home number as well as patients home number and none of their numbers are currently working , I have messaged patient through Mychart due to not having working phone numbers

## 2023-02-18 NOTE — Telephone Encounter (Signed)
LMTCB

## 2023-02-21 ENCOUNTER — Ambulatory Visit: Payer: Medicare Other | Admitting: Family Medicine

## 2023-02-21 ENCOUNTER — Telehealth: Payer: Self-pay | Admitting: Family Medicine

## 2023-02-21 NOTE — Telephone Encounter (Signed)
Pt was a no show for an OV with Dr Doreene Burke on 02/21/23, he had a flat tire. I did send him a no show letter.

## 2023-02-21 NOTE — Telephone Encounter (Signed)
Attempted to contact patient and calls are not going through at this time

## 2023-02-22 NOTE — Telephone Encounter (Signed)
Several attempts have been made to contact patient with no success.

## 2023-02-28 NOTE — Telephone Encounter (Signed)
2nd no show, final warning sent via mail and mychart  Pt has rescheduled for 03/07/2023

## 2023-03-07 ENCOUNTER — Telehealth: Payer: Self-pay | Admitting: Family Medicine

## 2023-03-07 ENCOUNTER — Ambulatory Visit: Payer: Medicare Other | Admitting: Family Medicine

## 2023-03-07 NOTE — Telephone Encounter (Signed)
03/07/23 - No show letter sent

## 2023-03-11 NOTE — Telephone Encounter (Signed)
3rd no show, dismissal generated

## 2023-03-29 ENCOUNTER — Other Ambulatory Visit: Payer: Self-pay | Admitting: Family Medicine

## 2023-03-29 DIAGNOSIS — I10 Essential (primary) hypertension: Secondary | ICD-10-CM

## 2023-03-30 ENCOUNTER — Telehealth: Payer: Self-pay

## 2023-03-30 MED ORDER — LYUMJEV KWIKPEN 100 UNIT/ML ~~LOC~~ SOPN: 8.0000 [IU] | PEN_INJECTOR | Freq: Three times a day (TID) | SUBCUTANEOUS | 2 refills | Status: DC

## 2023-03-30 MED ORDER — LANTUS SOLOSTAR 100 UNIT/ML ~~LOC~~ SOPN: 30.0000 [IU] | PEN_INJECTOR | Freq: Every day | SUBCUTANEOUS | 1 refills | Status: DC

## 2023-03-30 NOTE — Telephone Encounter (Signed)
[employer] Therapist, art has been placed up front.  Also refill sent in: Requested Prescriptions   Signed Prescriptions Disp Refills   insulin glargine (LANTUS SOLOSTAR) 100 UNIT/ML Solostar Pen 15 mL 1    Sig: Inject 30 Units into the skin at bedtime.    Authorizing Provider: Carlus Pavlov    Ordering User: Pollie Meyer   Insulin Lispro-aabc (LYUMJEV KWIKPEN) 100 UNIT/ML KwikPen 15 mL 2    Sig: Inject 8-12 Units into the skin 3 (three) times daily before meals.    Authorizing Provider: Carlus Pavlov    Ordering User: Pollie Meyer

## 2023-04-05 ENCOUNTER — Telehealth: Payer: Self-pay

## 2023-04-05 IMAGING — DX DG RIBS 2V*R*
3 series · 3 of 3 positions shown · non-contrast
Comparison: Chest x-ray 02/09/2021.

CLINICAL DATA: Lower rib pain.

EXAM:
RIGHT RIBS - 2 VIEW

[chest pa]
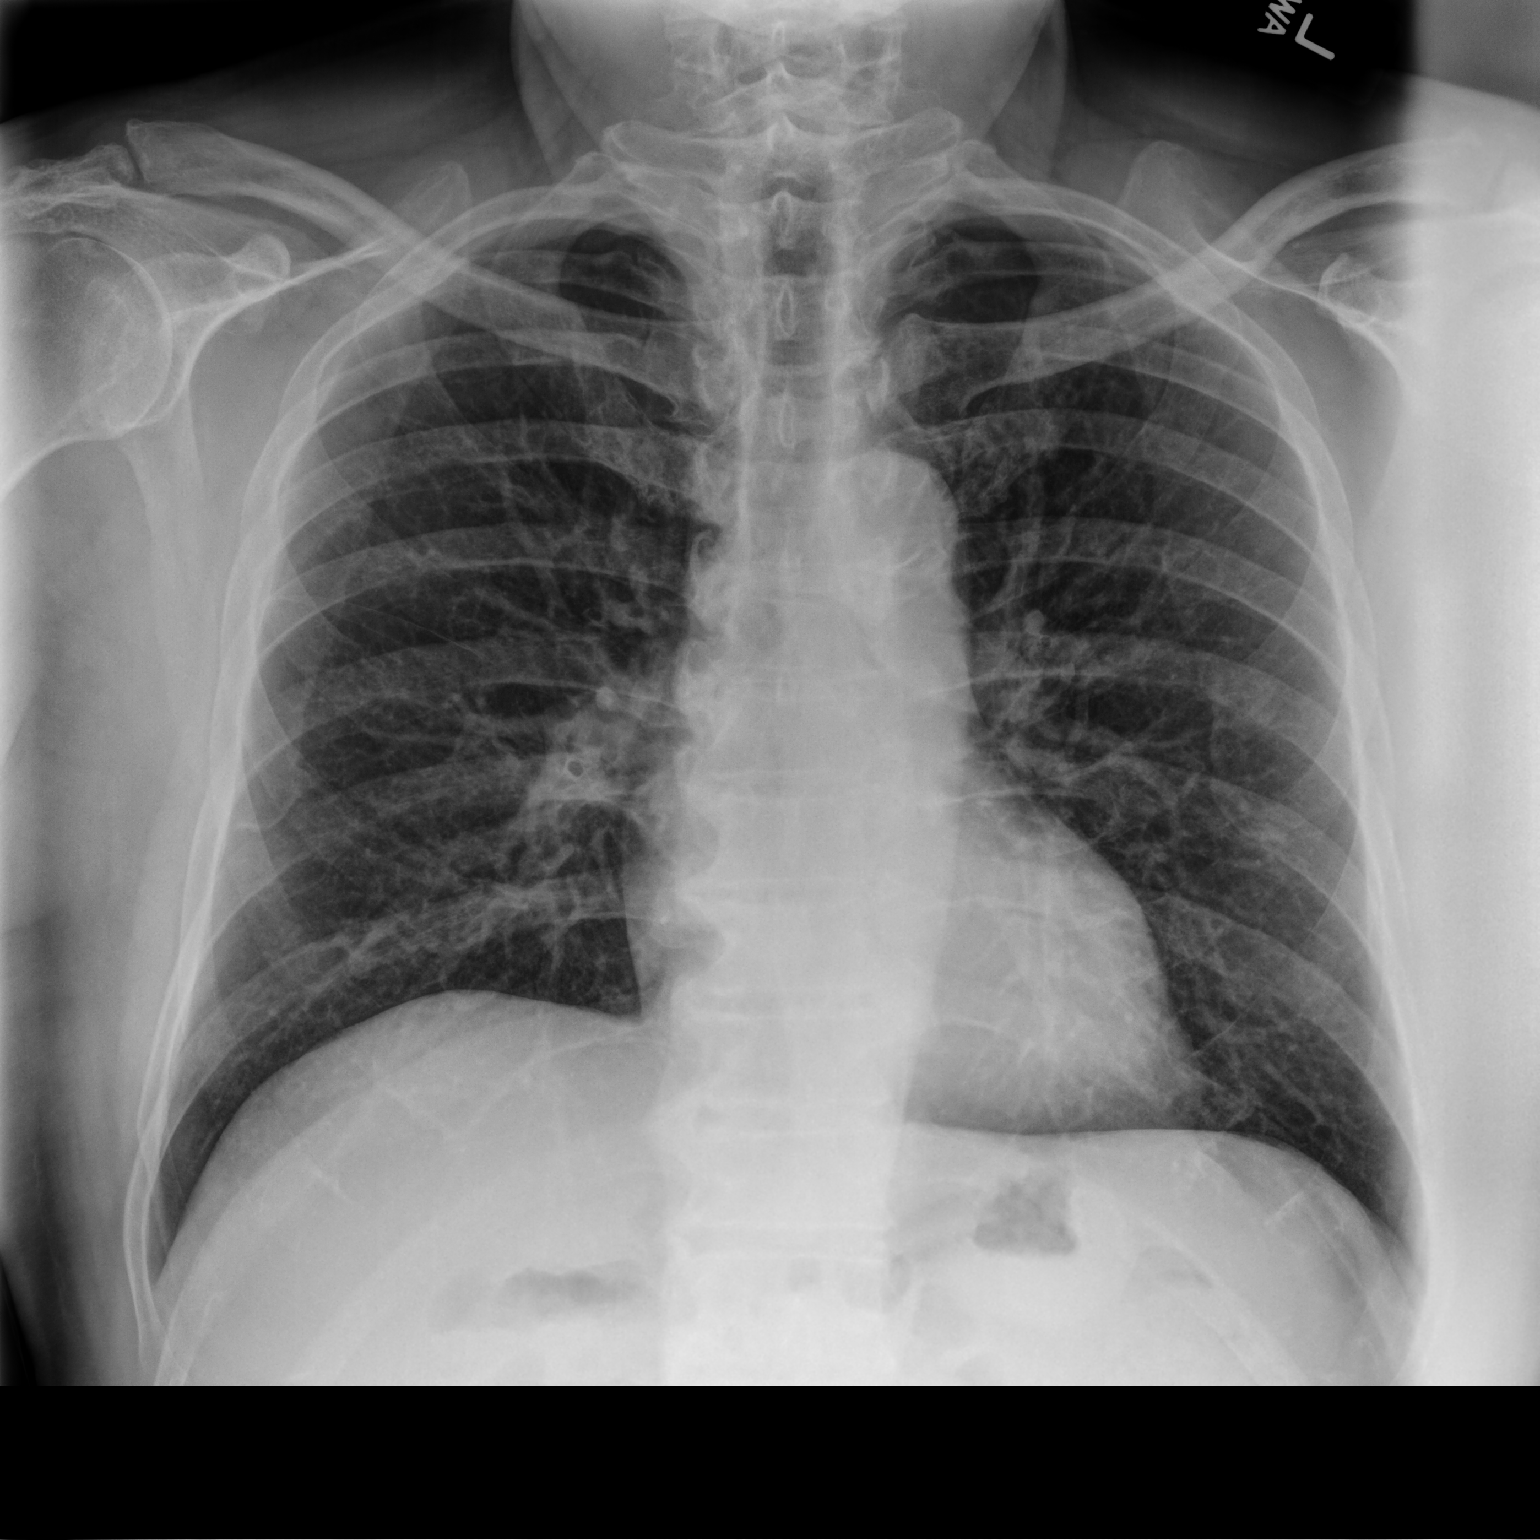

[hemithorax (ribs) ap]
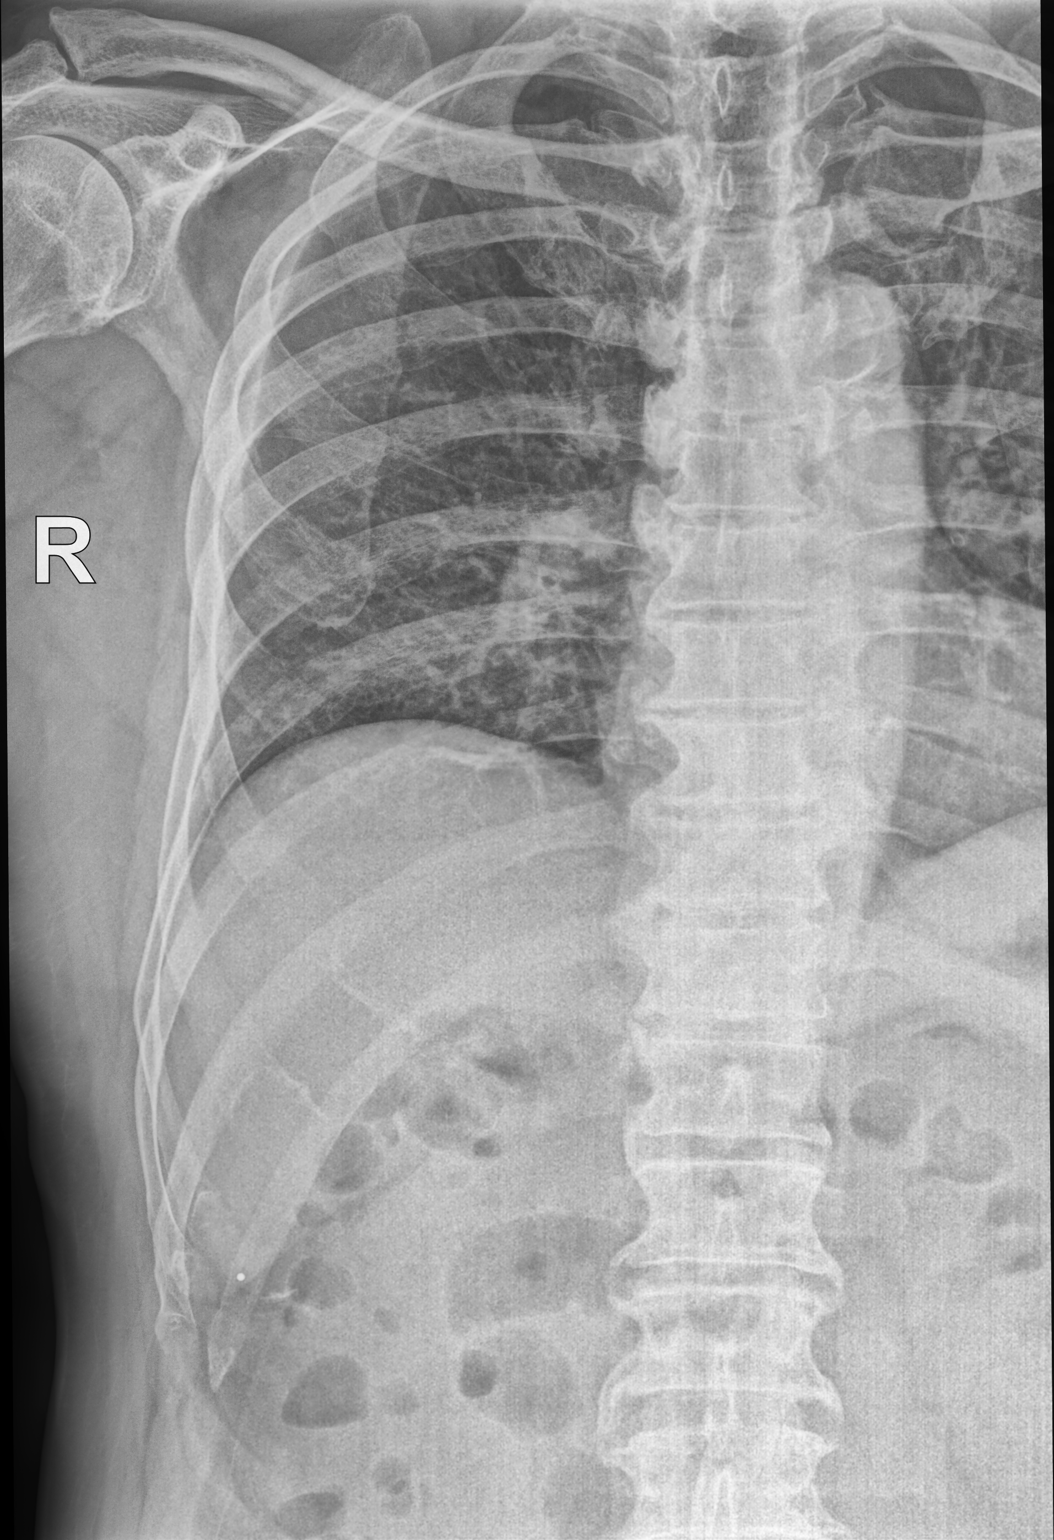

[hemithorax (ribs) mlo]
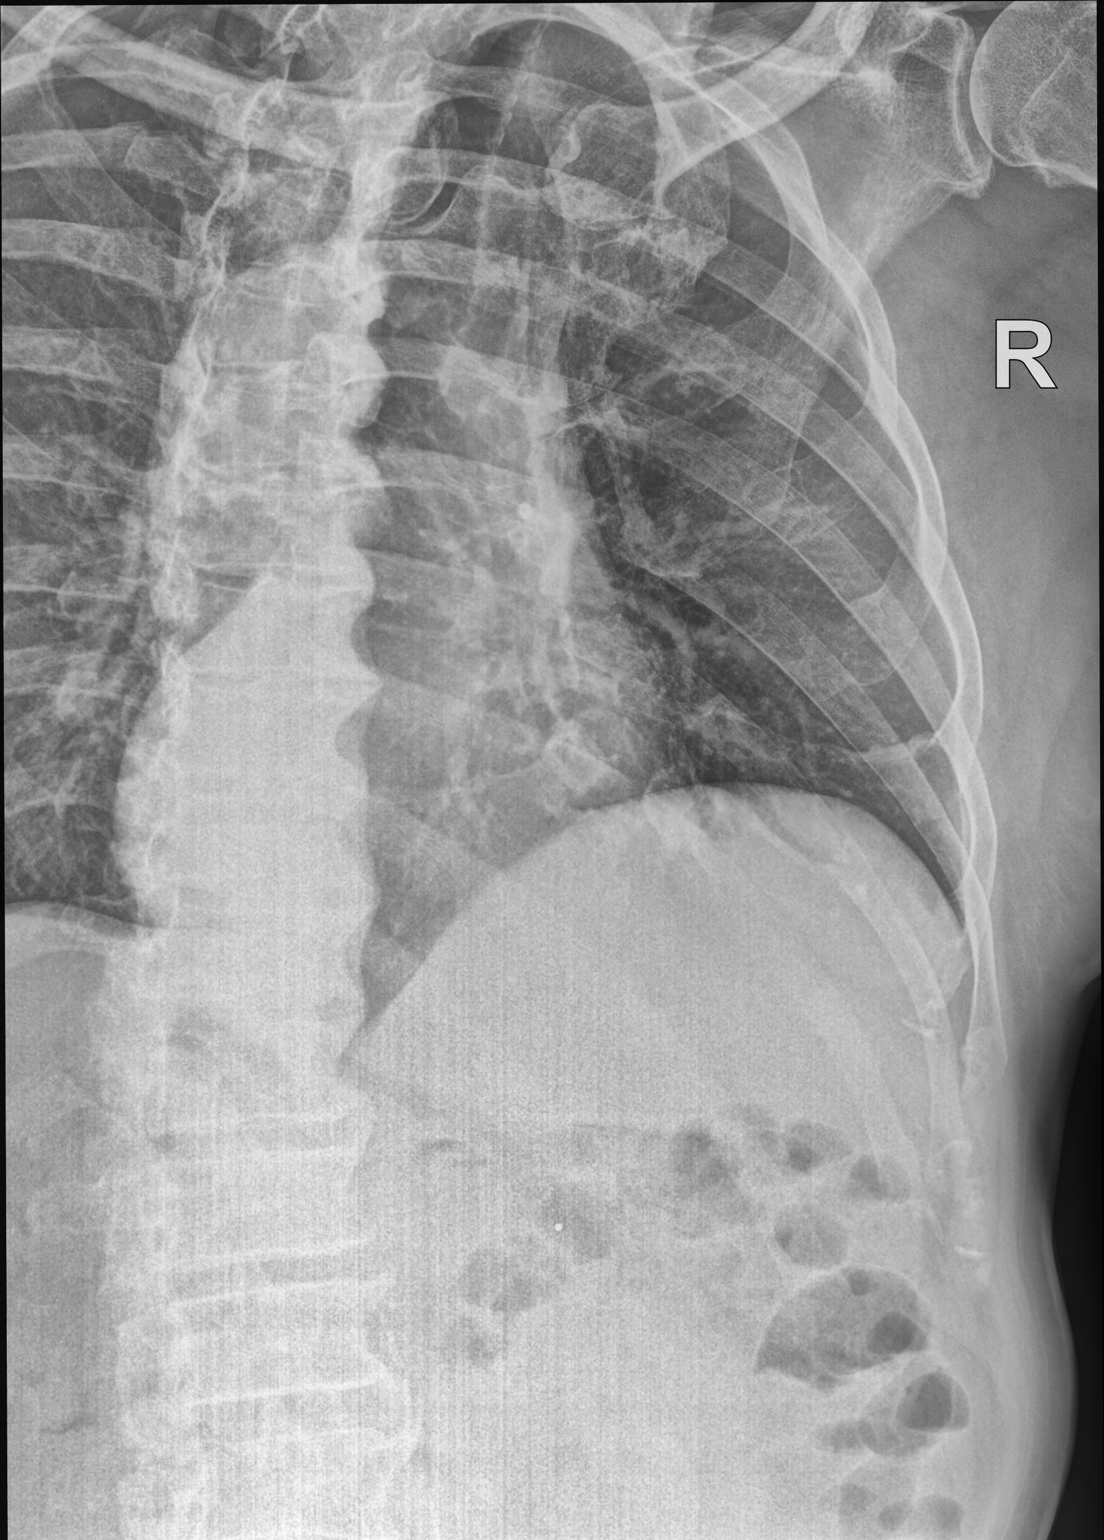

[3 of 3 positions shown; findings below may reference images not displayed]

FINDINGS: No fracture or other bone lesions are seen involving the ribs.
IMPRESSION: Negative.

## 2023-04-05 NOTE — Telephone Encounter (Signed)
This pt has been discharged for too many NS. His daughter has called and requested refills on some meds. She has requested a call to help her decide which.

## 2023-04-05 NOTE — Telephone Encounter (Signed)
[phone] 

## 2023-04-06 ENCOUNTER — Other Ambulatory Visit: Payer: Self-pay | Admitting: Family Medicine

## 2023-04-06 ENCOUNTER — Other Ambulatory Visit: Payer: Self-pay

## 2023-04-06 DIAGNOSIS — I10 Essential (primary) hypertension: Secondary | ICD-10-CM

## 2023-04-06 DIAGNOSIS — E538 Deficiency of other specified B group vitamins: Secondary | ICD-10-CM

## 2023-04-06 MED ORDER — AMLODIPINE BESYLATE 5 MG PO TABS: ORAL_TABLET | ORAL | 0 refills | Status: DC

## 2023-04-06 MED ORDER — METOPROLOL SUCCINATE ER 25 MG PO TB24
12.5000 mg | ORAL_TABLET | Freq: Every day | ORAL | 0 refills | Status: DC
Start: 2023-04-06 — End: 2024-01-03

## 2023-04-06 MED ORDER — VITAMIN B-12 1000 MCG PO TABS: 1000.0000 ug | ORAL_TABLET | Freq: Every day | ORAL | 0 refills | Status: AC

## 2023-04-06 MED ORDER — LOSARTAN POTASSIUM 50 MG PO TABS
50.0000 mg | ORAL_TABLET | Freq: Every day | ORAL | 1 refills | Status: DC
Start: 2023-04-06 — End: 2024-01-03

## 2023-04-06 MED ORDER — ATORVASTATIN CALCIUM 80 MG PO TABS: 80.0000 mg | ORAL_TABLET | Freq: Every day | ORAL | 0 refills | Status: DC

## 2023-04-06 NOTE — Telephone Encounter (Signed)
Pt dismissed. 30 day supply refilled of the medications prescribed by Dr. Doreene Burke that pt daughter requested. Sent to [facility] on [address].

## 2023-04-28 ENCOUNTER — Ambulatory Visit: Payer: Medicare Other | Admitting: Internal Medicine

## 2023-09-15 ENCOUNTER — Emergency Department (HOSPITAL_COMMUNITY)
Admission: EM | Admit: 2023-09-15 | Discharge: 2023-09-16 | Disposition: A | Discharge status: Home / Self Care | Payer: Medicare Other | Attending: Student | Admitting: Student

## 2023-09-15 ENCOUNTER — Encounter (HOSPITAL_COMMUNITY): Payer: Self-pay | Admitting: Emergency Medicine

## 2023-09-15 ENCOUNTER — Other Ambulatory Visit: Payer: Self-pay

## 2023-09-15 DIAGNOSIS — Z7982 Long term (current) use of aspirin: Secondary | ICD-10-CM | POA: Diagnosis not present

## 2023-09-15 DIAGNOSIS — R509 Fever, unspecified: Secondary | ICD-10-CM | POA: Diagnosis not present

## 2023-09-15 DIAGNOSIS — Z794 Long term (current) use of insulin: Secondary | ICD-10-CM | POA: Insufficient documentation

## 2023-09-15 DIAGNOSIS — E1165 Type 2 diabetes mellitus with hyperglycemia: Secondary | ICD-10-CM | POA: Insufficient documentation

## 2023-09-15 DIAGNOSIS — R059 Cough, unspecified: Secondary | ICD-10-CM | POA: Diagnosis not present

## 2023-09-15 DIAGNOSIS — R6889 Other general symptoms and signs: Secondary | ICD-10-CM | POA: Diagnosis not present

## 2023-09-15 DIAGNOSIS — R079 Chest pain, unspecified: Secondary | ICD-10-CM | POA: Diagnosis not present

## 2023-09-15 DIAGNOSIS — I10 Essential (primary) hypertension: Secondary | ICD-10-CM | POA: Diagnosis not present

## 2023-09-15 DIAGNOSIS — J101 Influenza due to other identified influenza virus with other respiratory manifestations: Secondary | ICD-10-CM | POA: Insufficient documentation

## 2023-09-15 DIAGNOSIS — I509 Heart failure, unspecified: Secondary | ICD-10-CM | POA: Diagnosis not present

## 2023-09-15 DIAGNOSIS — Z743 Need for continuous supervision: Secondary | ICD-10-CM | POA: Diagnosis not present

## 2023-09-15 DIAGNOSIS — Z7984 Long term (current) use of oral hypoglycemic drugs: Secondary | ICD-10-CM | POA: Insufficient documentation

## 2023-09-15 DIAGNOSIS — I447 Left bundle-branch block, unspecified: Secondary | ICD-10-CM | POA: Diagnosis not present

## 2023-09-15 DIAGNOSIS — R109 Unspecified abdominal pain: Secondary | ICD-10-CM | POA: Diagnosis not present

## 2023-09-15 LAB — BASIC METABOLIC PANEL
Anion gap: 13 (ref 5–15)
BUN: 22 mg/dL (ref 8–23)
CO2: 19 mmol/L — ABNORMAL LOW (ref 22–32)
Calcium: 8.3 mg/dL — ABNORMAL LOW (ref 8.9–10.3)
Chloride: 99 mmol/L (ref 98–111)
Creatinine, Ser: 1.13 mg/dL (ref 0.61–1.24)
GFR, Estimated: >60 mL/min (ref >60)
Glucose, Bld: 271 mg/dL — ABNORMAL HIGH (ref 70–99)
Potassium: 3.4 mmol/L — ABNORMAL LOW (ref 3.5–5.1)
Sodium: 131 mmol/L — ABNORMAL LOW (ref 135–145)

## 2023-09-15 LAB — CBC
HCT: 41.6 % (ref 39.0–52.0)
Hemoglobin: 14.2 g/dL (ref 13.0–17.0)
MCH: 29.4 pg (ref 26.0–34.0)
MCHC: 34.1 g/dL (ref 30.0–36.0)
MCV: 86.1 fL (ref 80.0–100.0)
Platelets: 139 10*3/uL — ABNORMAL LOW (ref 150–400)
RBC: 4.83 MIL/uL (ref 4.22–5.81)
RDW: 12.8 % (ref 11.5–15.5)
WBC: 7 10*3/uL (ref 4.0–10.5)
nRBC: 0 % (ref 0.0–0.2)

## 2023-09-15 LAB — TROPONIN I (HIGH SENSITIVITY): Troponin I (High Sensitivity): 45 ng/L — ABNORMAL HIGH (ref <18)

## 2023-09-15 LAB — CBG MONITORING, ED: Glucose-Capillary: 248 mg/dL — ABNORMAL HIGH (ref 70–99)

## 2023-09-15 NOTE — ED Triage Notes (Signed)
Pt in from home via [facility] with flu-like symptoms x 2 days. Pt reports fever, cp, cough and general malaise. Temp 101.2 for EMS - given 1g Tylenol PTA and 4mg  Zofran given through 20G LAC started by EMS. CBG 323, pt reports little intake despite high sugars

## 2023-09-16 ENCOUNTER — Emergency Department (HOSPITAL_COMMUNITY): Payer: Medicare Other

## 2023-09-16 DIAGNOSIS — R079 Chest pain, unspecified: Secondary | ICD-10-CM | POA: Diagnosis not present

## 2023-09-16 DIAGNOSIS — R059 Cough, unspecified: Secondary | ICD-10-CM | POA: Diagnosis not present

## 2023-09-16 DIAGNOSIS — R509 Fever, unspecified: Secondary | ICD-10-CM | POA: Diagnosis not present

## 2023-09-16 LAB — RESP PANEL BY RT-PCR (RSV, FLU A&B, COVID)  RVPGX2
Influenza A by PCR: POSITIVE — AB
Influenza B by PCR: NEGATIVE
Resp Syncytial Virus by PCR: NEGATIVE
SARS Coronavirus 2 by RT PCR: NEGATIVE

## 2023-09-16 LAB — TROPONIN I (HIGH SENSITIVITY): Troponin I (High Sensitivity): 41 ng/L — ABNORMAL HIGH (ref <18)

## 2023-09-16 MED ORDER — MAGNESIUM OXIDE -MG SUPPLEMENT 400 (240 MG) MG PO TABS
800.0000 mg | ORAL_TABLET | Freq: Once | ORAL | Status: AC
  Administered 2023-09-16: 800 mg via ORAL
  Filled 2023-09-16: qty 2

## 2023-09-16 MED ORDER — OSELTAMIVIR PHOSPHATE 75 MG PO CAPS
75.0000 mg | ORAL_CAPSULE | Freq: Two times a day (BID) | ORAL | 0 refills | Status: DC
Start: 2023-09-16 — End: 2023-12-26

## 2023-09-16 MED ORDER — BENZONATATE 100 MG PO CAPS: 100.0000 mg | ORAL_CAPSULE | Freq: Three times a day (TID) | ORAL | 0 refills | Status: DC

## 2023-09-16 MED ORDER — ONDANSETRON 4 MG PO TBDP: 4.0000 mg | ORAL_TABLET | Freq: Three times a day (TID) | ORAL | 0 refills | Status: DC | PRN

## 2023-09-16 MED ORDER — POTASSIUM CHLORIDE CRYS ER 20 MEQ PO TBCR
40.0000 meq | EXTENDED_RELEASE_TABLET | Freq: Once | ORAL | Status: AC
  Administered 2023-09-16: 40 meq via ORAL
  Filled 2023-09-16: qty 2

## 2023-09-16 MED ORDER — ONDANSETRON HCL 4 MG/2ML IJ SOLN
4.0000 mg | Freq: Once | INTRAMUSCULAR | Status: AC
  Administered 2023-09-16: 4 mg via INTRAVENOUS
  Filled 2023-09-16: qty 2

## 2023-09-16 NOTE — Discharge Instructions (Signed)
You are found to have influenza A today.  Make sure to rest and stay hydrated best you can. Take the prescribed medication as directed. Follow-up with your doctor. Return to the ED for new or worsening symptoms.

## 2023-09-16 NOTE — ED Provider Notes (Signed)
[place] EMERGENCY DEPARTMENT AT [facility] Provider Note   CSN: [id number] Arrival date & time: 09/15/23  2051     History  Chief Complaint  Patient presents with   Flu-Like Symptoms   Hyperglycemia    Scott Donaldson is a 73 y.o. male.  The history is provided by the patient and medical records.  Hyperglycemia Associated symptoms: fatigue, fever and nausea    73 year old male with history of chronic anemia, CHF, diabetes, hyperlipidemia, presenting to the ED with flulike symptoms.  These began on Tuesday and have been worsening since that time.  His grandson did test positive for the flu.  States he feels generally weak with fever, cough, and shortness of breath.  Does have some mild chest pain but associates this with coughing.  He has had decreased appetite but was able to drink some ginger ale today.  He has not had any vomiting or diarrhea.  He did have Tylenol and Zofran prior to arrival.  Still having some nausea.   Home Medications Prior to Admission medications   Medication Sig Start Date End Date Taking? Authorizing Provider  benzonatate (TESSALON) 100 MG capsule Take 1 capsule (100 mg total) by mouth every 8 (eight) hours. 09/16/23  Yes Garlon Hatchet, PA-C  ondansetron (ZOFRAN-ODT) 4 MG disintegrating tablet Take 1 tablet (4 mg total) by mouth every 8 (eight) hours as needed. 09/16/23  Yes Garlon Hatchet, PA-C  oseltamivir (TAMIFLU) 75 MG capsule Take 1 capsule (75 mg total) by mouth every 12 (twelve) hours. 09/16/23  Yes Garlon Hatchet, PA-C  Accu-Chek Softclix Lancets lancets Check blood sugar 2 times daily 01/20/22   Carlus Pavlov, MD  amLODipine (NORVASC) 5 MG tablet TAKE 1 TABLET(5 MG) BY MOUTH DAILY 04/06/23   Mliss Sax, MD  amoxicillin (AMOXIL) 500 MG capsule Take 500 mg by mouth 3 (three) times daily. 12/28/21   [provider]  aspirin EC 81 MG tablet Take 81 mg by mouth daily. Swallow whole.    [provider]   atorvastatin (LIPITOR) 80 MG tablet Take 1 tablet (80 mg total) by mouth daily. 04/06/23 05/06/23  Mliss Sax, MD  Blood Glucose Monitoring Suppl (ACCU-CHEK GUIDE ME) w/Device KIT Check blood sugars 2 times daily 01/20/22   Carlus Pavlov, MD  ezetimibe (ZETIA) 10 MG tablet Take 1 tablet (10 mg total) by mouth daily. 10/23/21 01/21/22  Darlin Drop, DO  glucose blood (ACCU-CHEK GUIDE) test strip Check blood sugars 2 times daily 01/20/22   Carlus Pavlov, MD  glucose blood test strip 1 each by Other route 2 (two) times daily. And lancets 2/day 08/21/21   Romero Belling, MD  insulin glargine (LANTUS SOLOSTAR) 100 UNIT/ML Solostar Pen Inject 30 Units into the skin at bedtime. 03/30/23   Carlus Pavlov, MD  Insulin Lispro-aabc (LYUMJEV KWIKPEN) 100 UNIT/ML KwikPen Inject 8-12 Units into the skin 3 (three) times daily before meals. 03/30/23   Carlus Pavlov, MD  Iron, Ferrous Sulfate, 325 (65 Fe) MG TABS Take one daily Patient taking differently: Take 325 mg by mouth daily. Take one daily 04/18/20   Mliss Sax, MD  losartan (COZAAR) 50 MG tablet Take 1 tablet (50 mg total) by mouth daily. 04/06/23 05/06/23  Mliss Sax, MD  magnesium oxide (MAG-OX) 400 (240 Mg) MG tablet Take 1 tablet by mouth daily. 10/23/21   [provider]  metFORMIN (GLUCOPHAGE-XR) 500 MG 24 hr tablet Take 1 tablet (500 mg total) by mouth daily with  breakfast. 10/08/22   Carlus Pavlov, MD  metoprolol succinate (TOPROL-XL) 25 MG 24 hr tablet Take 0.5 tablets (12.5 mg total) by mouth daily for 30 doses. 04/06/23 05/06/23  Mliss Sax, MD  nitroGLYCERIN (NITROSTAT) 0.4 MG SL tablet Place 1 tablet (0.4 mg total) under the tongue every 5 (five) minutes as needed for chest pain. 11/03/21 02/01/22  Alver Sorrow, NP  Study - SOS-AMI - selatogrel 16 mg/0.5 mL or placebo SQ injection (PI-Christopher) Inject 16 mg into the skin once. For Investigational Use Only. Inject 0.5 mL  subcutaneously into the abdomen or thigh if experiencing symptoms of a heart attack. Call 911 immediately and seek emergency medical support. Please contact [employer] with any questions or concerns regarding this medication.    Jodelle Red, MD  Study - SOS-AMI - selatogrel 16 mg/0.5 mL or placebo SQ injection (PI-Christopher) Inject 0.5 mLs (16 mg total) into the skin once for 1 dose. For Investigational Use Only. Inject 0.5 mL subcutaneously into the abdomen or thigh if experiencing symptoms of a heart attack. Call 911 immediately and seek emergency medical support. 06/01/22 06/01/22  Jodelle Red, MD      Allergies    Patient has no known allergies.    Review of Systems   Review of Systems  Constitutional:  Positive for fatigue and fever.  Respiratory:  Positive for cough.   Gastrointestinal:  Positive for nausea.  All other systems reviewed and are negative.   Physical Exam Updated Vital Signs BP 124/65   Pulse (!) 55   Temp 98.4 F (36.9 C) (Oral)   Resp 18   Wt 80.3 kg   SpO2 96%   BMI 25.40 kg/m  Physical Exam Vitals and nursing note reviewed.  Constitutional:      Appearance: He is well-developed.  HENT:     Head: Normocephalic and atraumatic.  Eyes:     Conjunctiva/sclera: Conjunctivae normal.     Pupils: Pupils are equal, round, and reactive to light.  Cardiovascular:     Rate and Rhythm: Normal rate and regular rhythm.     Heart sounds: Normal heart sounds.  Pulmonary:     Effort: Pulmonary effort is normal. No respiratory distress.     Breath sounds: Normal breath sounds. No rhonchi.  Abdominal:     General: Bowel sounds are normal.     Palpations: Abdomen is soft.     Tenderness: There is no abdominal tenderness. There is no rebound.  Musculoskeletal:        General: Normal range of motion.     Cervical back: Normal range of motion.  Skin:    General: Skin is warm and dry.  Neurological:     Mental Status: He is alert  and oriented to person, place, and time.     ED Results / Procedures / Treatments   Labs (all labs ordered are listed, but only abnormal results are displayed) Labs Reviewed  RESP PANEL BY RT-PCR (RSV, FLU A&B, COVID)  RVPGX2 - Abnormal; Notable for the following components:      Result Value   Influenza A by PCR POSITIVE (*)    All other components within normal limits  BASIC METABOLIC PANEL - Abnormal; Notable for the following components:   Sodium 131 (*)    Potassium 3.4 (*)    CO2 19 (*)    Glucose, Bld 271 (*)    Calcium 8.3 (*)    All other components within normal limits  CBC - Abnormal; Notable  for the following components:   Platelets 139 (*)    All other components within normal limits  CBG MONITORING, ED - Abnormal; Notable for the following components:   Glucose-Capillary 248 (*)    All other components within normal limits  TROPONIN I (HIGH SENSITIVITY) - Abnormal; Notable for the following components:   Troponin I (High Sensitivity) 45 (*)    All other components within normal limits  TROPONIN I (HIGH SENSITIVITY) - Abnormal; Notable for the following components:   Troponin I (High Sensitivity) 41 (*)    All other components within normal limits  URINALYSIS, ROUTINE W REFLEX MICROSCOPIC    EKG EKG Interpretation Date/Time:  Thursday September 15 2023 21:14:34 EST Ventricular Rate:  94 PR Interval:  151 QRS Duration:  129 QT Interval:  415 QTC Calculation: 519 R Axis:   -73  Text Interpretation: Sinus rhythm Left bundle branch block Confirmed by Kommor, Madison ([id number]) on 09/16/2023 3:13:06 AM  Radiology DG Chest Port 1 View Result Date: 09/16/2023 CLINICAL DATA:  Flu like symptoms, fever, chest pain, cough EXAM: PORTABLE CHEST 1 VIEW COMPARISON:  10/21/2021 FINDINGS: Lungs are clear.  No pleural effusion or pneumothorax. The heart is normal in size. IMPRESSION: No acute cardiopulmonary disease. Electronically Signed   By: Charline Bills M.D.   On:  09/16/2023 00:48    Procedures Procedures    Medications Ordered in ED Medications  ondansetron (ZOFRAN) injection 4 mg (4 mg Intravenous Given 09/16/23 0054)  magnesium oxide (MAG-OX) tablet 800 mg (800 mg Oral Given 09/16/23 0410)  potassium chloride SA (KLOR-CON M) CR tablet 40 mEq (40 mEq Oral Given 09/16/23 0410)    ED Course/ Medical Decision Making/ A&P                                 Medical Decision Making Amount and/or Complexity of Data Reviewed Labs: ordered. Radiology: ordered and independent interpretation performed. ECG/medicine tests: ordered and independent interpretation performed.  Risk OTC drugs. Prescription drug management.   74 year old male presenting to the ED with 48 hours of flulike symptoms.  Grandson sick with influenza as well.  Febrile on arrival but non-toxic in appearance.  VSS on RA.  Dry cough without respiratory distress.    EKG is nonischemic, largely unchanged from prior.  Labs as above--no leukocytosis, does have hyperglycemia with some minor electrolyte derangements.  Normal anion gap..  Troponin is 45.  RVP + for influenza A.    Patient does have prior cardiac hx but only reports chest pain with coughing.  Feel less likely ACS but given history and risk factors, delta troponin obtained which remains flat at 41.  Suspect symptoms related to flu A.  Will initiate tamiflu and symptomatic care.  Can follow-up with PCP.  Return here for new concerns.  Shared visit with attending physician, Dr. Posey Rea, who agrees with care plan.  Final Clinical Impression(s) / ED Diagnoses Final diagnoses:  Influenza A    Rx / DC Orders ED Discharge Orders          Ordered    oseltamivir (TAMIFLU) 75 MG capsule  Every 12 hours        09/16/23 0456    benzonatate (TESSALON) 100 MG capsule  Every 8 hours        09/16/23 0456    ondansetron (ZOFRAN-ODT) 4 MG disintegrating tablet  Every 8 hours PRN        09/16/23 0456  Garlon Hatchet, PA-C 09/16/23 0505    Glendora Score, MD 09/16/23 [phone]

## 2023-12-03 ENCOUNTER — Other Ambulatory Visit: Payer: Self-pay | Admitting: Internal Medicine

## 2023-12-09 ENCOUNTER — Ambulatory Visit: Payer: Self-pay

## 2023-12-09 NOTE — Telephone Encounter (Signed)
 Attempt X2 to call patient via the preferred phone number on file. No response noted, went straight to VM. Unable to leave VM.

## 2023-12-09 NOTE — Telephone Encounter (Signed)
 Patient disconnected prior to transfer. Attempted to call the patient back but no answer. Unable to leave message due to full voicemail. Will route for further attempts.    Copied from CRM [id number]. Topic: Clinical - Red Word Triage >> Dec 09, 2023  4:46 PM Magdalene School wrote: Red Word that prompted transfer to Nurse Triage: Chest pain.

## 2023-12-12 ENCOUNTER — Telehealth: Payer: Self-pay

## 2023-12-12 NOTE — Telephone Encounter (Signed)
 Pt has been dismissed from department.

## 2023-12-12 NOTE — Telephone Encounter (Signed)
 Copied from CRM [id number]. Topic: General - Other >> Dec 12, 2023 12:45 PM Howard Macho wrote: Reason for CRM: monica from [facility] call called stating the patient PAD screenings showed that  his left foot was normal 1.8 and his right foot was 0.81 mild. Scott Donaldson does have questions for the nurse and would like a call back CB [phone]

## 2023-12-12 NOTE — Telephone Encounter (Signed)
 Returned [name]'s call, LVM to inform her that [name] has been dismissed and no longer has a PCP in this office.

## 2023-12-15 ENCOUNTER — Telehealth: Payer: Self-pay | Admitting: Family Medicine

## 2023-12-15 NOTE — Telephone Encounter (Unsigned)
 Copied from CRM [id number]. Topic: General - Other >> Dec 15, 2023  2:44 PM Clyde Darling P wrote: Reason for CRM: pt son derrick called to advise someone was to reach out to him after speaking with a supervisor in regards to pt dismissed from clinic- derrick can be reached at [phone]

## 2023-12-25 ENCOUNTER — Other Ambulatory Visit: Payer: Self-pay

## 2023-12-25 ENCOUNTER — Emergency Department (HOSPITAL_COMMUNITY)

## 2023-12-25 ENCOUNTER — Encounter (HOSPITAL_COMMUNITY): Payer: Self-pay

## 2023-12-25 ENCOUNTER — Inpatient Hospital Stay (HOSPITAL_COMMUNITY)
Admission: EM | Admit: 2023-12-25 | Discharge: 2024-01-03 | DRG: 234 | Disposition: A | Discharge status: Home Health | Attending: Thoracic Surgery (Cardiothoracic Vascular Surgery) | Admitting: Thoracic Surgery (Cardiothoracic Vascular Surgery)

## 2023-12-25 DIAGNOSIS — Z800 Family history of malignant neoplasm of digestive organs: Secondary | ICD-10-CM

## 2023-12-25 DIAGNOSIS — Z01810 Encounter for preprocedural cardiovascular examination: Secondary | ICD-10-CM | POA: Diagnosis not present

## 2023-12-25 DIAGNOSIS — E785 Hyperlipidemia, unspecified: Secondary | ICD-10-CM | POA: Diagnosis not present

## 2023-12-25 DIAGNOSIS — Z794 Long term (current) use of insulin: Secondary | ICD-10-CM | POA: Diagnosis not present

## 2023-12-25 DIAGNOSIS — Z79899 Other long term (current) drug therapy: Secondary | ICD-10-CM | POA: Diagnosis not present

## 2023-12-25 DIAGNOSIS — I10 Essential (primary) hypertension: Secondary | ICD-10-CM | POA: Diagnosis not present

## 2023-12-25 DIAGNOSIS — D696 Thrombocytopenia, unspecified: Secondary | ICD-10-CM | POA: Diagnosis not present

## 2023-12-25 DIAGNOSIS — Z803 Family history of malignant neoplasm of breast: Secondary | ICD-10-CM | POA: Diagnosis not present

## 2023-12-25 DIAGNOSIS — I252 Old myocardial infarction: Secondary | ICD-10-CM

## 2023-12-25 DIAGNOSIS — E1140 Type 2 diabetes mellitus with diabetic neuropathy, unspecified: Secondary | ICD-10-CM | POA: Diagnosis not present

## 2023-12-25 DIAGNOSIS — Z87891 Personal history of nicotine dependence: Secondary | ICD-10-CM | POA: Diagnosis not present

## 2023-12-25 DIAGNOSIS — I200 Unstable angina: Secondary | ICD-10-CM | POA: Diagnosis not present

## 2023-12-25 DIAGNOSIS — Z5986 Financial insecurity: Secondary | ICD-10-CM

## 2023-12-25 DIAGNOSIS — Z7982 Long term (current) use of aspirin: Secondary | ICD-10-CM | POA: Diagnosis not present

## 2023-12-25 DIAGNOSIS — Z555 Less than a high school diploma: Secondary | ICD-10-CM

## 2023-12-25 DIAGNOSIS — D62 Acute posthemorrhagic anemia: Secondary | ICD-10-CM | POA: Diagnosis not present

## 2023-12-25 DIAGNOSIS — Z7984 Long term (current) use of oral hypoglycemic drugs: Secondary | ICD-10-CM

## 2023-12-25 DIAGNOSIS — N400 Enlarged prostate without lower urinary tract symptoms: Secondary | ICD-10-CM | POA: Diagnosis present

## 2023-12-25 DIAGNOSIS — Y831 Surgical operation with implant of artificial internal device as the cause of abnormal reaction of the patient, or of later complication, without mention of misadventure at the time of the procedure: Secondary | ICD-10-CM | POA: Diagnosis present

## 2023-12-25 DIAGNOSIS — I471 Supraventricular tachycardia, unspecified: Secondary | ICD-10-CM | POA: Diagnosis not present

## 2023-12-25 DIAGNOSIS — I2510 Atherosclerotic heart disease of native coronary artery without angina pectoris: Secondary | ICD-10-CM | POA: Diagnosis not present

## 2023-12-25 DIAGNOSIS — Z951 Presence of aortocoronary bypass graft: Secondary | ICD-10-CM | POA: Diagnosis not present

## 2023-12-25 DIAGNOSIS — R079 Chest pain, unspecified: Secondary | ICD-10-CM | POA: Diagnosis not present

## 2023-12-25 DIAGNOSIS — T82855A Stenosis of coronary artery stent, initial encounter: Secondary | ICD-10-CM | POA: Diagnosis not present

## 2023-12-25 DIAGNOSIS — I25700 Atherosclerosis of coronary artery bypass graft(s), unspecified, with unstable angina pectoris: Secondary | ICD-10-CM | POA: Diagnosis not present

## 2023-12-25 DIAGNOSIS — I25110 Atherosclerotic heart disease of native coronary artery with unstable angina pectoris: Secondary | ICD-10-CM | POA: Diagnosis not present

## 2023-12-25 DIAGNOSIS — I973 Postprocedural hypertension: Secondary | ICD-10-CM | POA: Diagnosis not present

## 2023-12-25 DIAGNOSIS — Z9079 Acquired absence of other genital organ(s): Secondary | ICD-10-CM

## 2023-12-25 DIAGNOSIS — Z833 Family history of diabetes mellitus: Secondary | ICD-10-CM

## 2023-12-25 DIAGNOSIS — I509 Heart failure, unspecified: Secondary | ICD-10-CM | POA: Diagnosis not present

## 2023-12-25 DIAGNOSIS — I447 Left bundle-branch block, unspecified: Secondary | ICD-10-CM | POA: Diagnosis present

## 2023-12-25 DIAGNOSIS — I110 Hypertensive heart disease with heart failure: Secondary | ICD-10-CM | POA: Diagnosis not present

## 2023-12-25 DIAGNOSIS — E1165 Type 2 diabetes mellitus with hyperglycemia: Secondary | ICD-10-CM | POA: Diagnosis present

## 2023-12-25 DIAGNOSIS — R739 Hyperglycemia, unspecified: Secondary | ICD-10-CM

## 2023-12-25 LAB — CBC
HCT: 37.2 % — ABNORMAL LOW (ref 39.0–52.0)
Hemoglobin: 13.1 g/dL (ref 13.0–17.0)
MCH: 30.3 pg (ref 26.0–34.0)
MCHC: 35.2 g/dL (ref 30.0–36.0)
MCV: 85.9 fL (ref 80.0–100.0)
Platelets: 167 10*3/uL (ref 150–400)
RBC: 4.33 MIL/uL (ref 4.22–5.81)
RDW: 12.3 % (ref 11.5–15.5)
WBC: 5.6 10*3/uL (ref 4.0–10.5)
nRBC: 0 % (ref 0.0–0.2)

## 2023-12-25 LAB — CBG MONITORING, ED: Glucose-Capillary: 264 mg/dL — ABNORMAL HIGH (ref 70–99)

## 2023-12-25 LAB — HEMOGLOBIN A1C
Hgb A1c MFr Bld: 13.3 % — ABNORMAL HIGH (ref 4.8–5.6)
Mean Plasma Glucose: 335.01 mg/dL

## 2023-12-25 LAB — BASIC METABOLIC PANEL WITH GFR
Anion gap: 12 (ref 5–15)
BUN: 27 mg/dL — ABNORMAL HIGH (ref 8–23)
CO2: 21 mmol/L — ABNORMAL LOW (ref 22–32)
Calcium: 9.4 mg/dL (ref 8.9–10.3)
Chloride: 100 mmol/L (ref 98–111)
Creatinine, Ser: 1.04 mg/dL (ref 0.61–1.24)
GFR, Estimated: >60 mL/min (ref >60)
Glucose, Bld: 578 mg/dL (ref 70–99)
Potassium: 4.5 mmol/L (ref 3.5–5.1)
Sodium: 133 mmol/L — ABNORMAL LOW (ref 135–145)

## 2023-12-25 LAB — GLUCOSE, CAPILLARY: Glucose-Capillary: 284 mg/dL — ABNORMAL HIGH (ref 70–99)

## 2023-12-25 LAB — TROPONIN I (HIGH SENSITIVITY)
Troponin I (High Sensitivity): 45 ng/L — ABNORMAL HIGH (ref <18)
Troponin I (High Sensitivity): 62 ng/L — ABNORMAL HIGH (ref <18)

## 2023-12-25 MED ORDER — LACTATED RINGERS IV BOLUS
1000.0000 mL | Freq: Once | INTRAVENOUS | Status: AC
  Administered 2023-12-25: 1000 mL via INTRAVENOUS

## 2023-12-25 MED ORDER — ACETAMINOPHEN 325 MG PO TABS
650.0000 mg | ORAL_TABLET | Freq: Once | ORAL | Status: AC
  Administered 2023-12-25: 650 mg via ORAL
  Filled 2023-12-25: qty 2

## 2023-12-25 MED ORDER — ASPIRIN 81 MG PO TBEC
81.0000 mg | DELAYED_RELEASE_TABLET | Freq: Every day | ORAL | Status: DC
  Filled 2023-12-25: qty 1

## 2023-12-25 MED ORDER — ASPIRIN 81 MG PO CHEW
324.0000 mg | CHEWABLE_TABLET | Freq: Once | ORAL | Status: AC
  Administered 2023-12-25: 324 mg via ORAL
  Filled 2023-12-25: qty 4

## 2023-12-25 MED ORDER — INSULIN ASPART 100 UNIT/ML IJ SOLN
15.0000 [IU] | Freq: Once | INTRAMUSCULAR | Status: AC
  Administered 2023-12-25: 15 [IU] via SUBCUTANEOUS

## 2023-12-25 MED ORDER — HEPARIN (PORCINE) 25000 UT/250ML-% IV SOLN
1150.0000 [IU]/h | INTRAVENOUS | Status: DC
  Administered 2023-12-25: 1050 [IU]/h via INTRAVENOUS
  Filled 2023-12-25: qty 250

## 2023-12-25 MED ORDER — NITROGLYCERIN 0.4 MG SL SUBL
0.4000 mg | SUBLINGUAL_TABLET | SUBLINGUAL | Status: DC | PRN
  Administered 2023-12-25: 0.4 mg via SUBLINGUAL
  Filled 2023-12-25: qty 1

## 2023-12-25 MED ORDER — SENNOSIDES-DOCUSATE SODIUM 8.6-50 MG PO TABS: 1.0000 | ORAL_TABLET | Freq: Every evening | ORAL | Status: DC | PRN

## 2023-12-25 MED ORDER — ACETAMINOPHEN 650 MG RE SUPP: 650.0000 mg | Freq: Four times a day (QID) | RECTAL | Status: DC | PRN

## 2023-12-25 MED ORDER — ACETAMINOPHEN 325 MG PO TABS
650.0000 mg | ORAL_TABLET | Freq: Four times a day (QID) | ORAL | Status: DC | PRN
  Administered 2023-12-25 – 2023-12-26 (×4): 650 mg via ORAL
  Filled 2023-12-25 (×4): qty 2

## 2023-12-25 MED ORDER — LOSARTAN POTASSIUM 25 MG PO TABS
25.0000 mg | ORAL_TABLET | Freq: Every day | ORAL | Status: DC
  Administered 2023-12-25: 25 mg via ORAL
  Filled 2023-12-25: qty 1

## 2023-12-25 MED ORDER — ATORVASTATIN CALCIUM 80 MG PO TABS
80.0000 mg | ORAL_TABLET | Freq: Every day | ORAL | Status: DC
  Administered 2023-12-25 – 2024-01-03 (×9): 80 mg via ORAL
  Filled 2023-12-25 (×3): qty 1
  Filled 2023-12-25: qty 2
  Filled 2023-12-25 (×5): qty 1

## 2023-12-25 MED ORDER — INSULIN ASPART 100 UNIT/ML IJ SOLN
0.0000 [IU] | Freq: Three times a day (TID) | INTRAMUSCULAR | Status: DC
  Administered 2023-12-25 – 2023-12-26 (×3): 8 [IU] via SUBCUTANEOUS
  Administered 2023-12-26: 15 [IU] via SUBCUTANEOUS
  Administered 2023-12-27: 11 [IU] via SUBCUTANEOUS

## 2023-12-25 MED ORDER — HEPARIN BOLUS VIA INFUSION
4000.0000 [IU] | Freq: Once | INTRAVENOUS | Status: AC
  Administered 2023-12-25: 4000 [IU] via INTRAVENOUS
  Filled 2023-12-25: qty 4000

## 2023-12-25 NOTE — ED Triage Notes (Signed)
 Pt arrived POV from home c/o left sided CP that started at 7am this morning that radiates into his neck and left arm. Pt also endorses some SHOB and nausea.

## 2023-12-25 NOTE — Progress Notes (Addendum)
 PHARMACY - ANTICOAGULATION CONSULT NOTE  Pharmacy Consult for heparin  Indication: chest pain/ACS  No Known Allergies  Patient Measurements: Height: 5\' 11"  (180.3 cm) Weight: 78.9 kg (174 lb) IBW/kg (Calculated) : 75.3 HEPARIN  DW (KG): 78.9  Vital Signs: Temp: 98.5 F (36.9 C) (06/01 1147) BP: 153/76 (06/01 1400) Pulse Rate: 49 (06/01 1400)  Labs: Recent Labs    12/25/23 1155  HGB 13.1  HCT 37.2*  PLT 167  CREATININE 1.04  TROPONINIHS 45*    Estimated Creatinine Clearance: 67.4 mL/min (by C-G formula based on SCr of 1.04 mg/dL).   Medical History: Past Medical History:  Diagnosis Date   Abdominal pain 08/04/2016   Abnormal PSA 05/19/2015   Anemia    Arthralgia 11/12/2010   Benign prostatic hyperplasia with urinary hesitancy 06/14/2018   Cataract    Coronary artery disease    nonobstructive   Diabetes (HCC) 03/04/2007   Qualifier: Diagnosis of  By: Washington Hacker MD, Sean A    DIABETES MELLITUS, TYPE II 03/04/2007   Elevated LDL cholesterol level 06/14/2018   Foot ulcer, right (HCC) 04/15/2014   GERD (gastroesophageal reflux disease)    HOH (hard of hearing)    both ears no hearing aides   HYPERCHOLESTEROLEMIA 07/29/2008   HYPERTENSION 03/04/2007   Knee pain, left 05/16/2015   Nonintractable episodic headache 12/01/2016   migraine   SHOULDER PAIN, BILATERAL 01/12/2010   Qualifier: Diagnosis of  By: Washington Hacker MD, Sean A    TUBULOVILLOUS ADENOMA, 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

## 2023-12-25 NOTE — H&P (Signed)
 Date: 12/25/2023               Patient Name:  Scott Donaldson MRN: [id number]  DOB: [birth year] Age / Sex: 73 y.o., male   PCP: Patient, No Pcp Per         Medical Service: Internal Medicine Teaching Service         Attending Physician: Dr. Driscilla George, MD      First Contact: Dorthy Gavia, MD    Second Contact: Jearldine Mina, DO         After Hours (After 5p/  First Contact Pager: [phone]  weekends / holidays): Second Contact Pager: [phone]   SUBJECTIVE   Chief Complaint: chest pain  History of Present Illness: Scott Donaldson is a 73 year old male with a PMH of NSTEMI (2023) s/p PCI and stent placement, HTN, diabetes, and HLD who presented to the ED with chest pain.   Reports a long history of anginal chest pain with exertion, which usually resolves with rest, but did not this time. Pain started when sleeping around 6AM.  Began in the lateral aspect of his left chest and radiated towards his sternum.  It was accompanied by shortness of breath and numbness going down his left arm.  He says that the quality of this pain is similar to previous episodes which usually occur with exertion, however this time it did not go away, so he contacted his son and daughter and they brought him to the emergency department.  They checked his blood pressure at home and it was high, which also concerned them.  He also notes some lightheadedness and nausea that accompanied the chest pain.  At time of this note, patient denies chest pain or nausea but states he has headache.  He says he has had headaches in the past when his blood sugar has been elevated.  No new vision or neurological changes noted.  He reports no longer taking medications for 7-8 months due to finances.  He was recently dismissed from his primary care provider for not going to his appointments.  His son and daughter are bedside and they note that he is stubborn and does not allow them to help him, especially financially, with his  medical care.  He started taking cheap 70/30 insulin  from [employer], 20 to 25 units daily, which he has been doing for the past few months or so.   Of note, he has a Hx of NSTEMI in 09/2021 with PCI and stent placement. Ostial CFX was 90% occluded so stent was placed with good result and residual moderate LM and ostial LAD stenosis.  ED Course: Troponin 45 Glucose 578, received 15 units novolog  1L NS bolus CBC unremarkable Chest pain improved with ASA, nitroglycerin  CXR normal Cardiology consulted  Meds:  -amlodipine  5 mg (not taking) -ASA 81 mg (not taking) -atorvastatin  80 mg (not taking) -Zetia  10 mg (not taking) -insulin  70/30 20-25 units  -iron  supplement (not taking) -losartan  50 mg (not taking) -metformin  500 mg daily (not taking) -nitroglycerin  0.4 PRN (not taking)  Past Medical History: Past Medical History:  Diagnosis Date   Abdominal pain 08/04/2016   Abnormal PSA 05/19/2015   Anemia    Arthralgia 11/12/2010   Benign prostatic hyperplasia with urinary hesitancy 06/14/2018   Cataract    Coronary artery disease    nonobstructive   Diabetes (HCC) 03/04/2007   Qualifier: Diagnosis of  By: Washington Hacker MD, Sean A    DIABETES MELLITUS, TYPE II 03/04/2007   Elevated LDL  cholesterol level 06/14/2018   Foot ulcer, right (HCC) 04/15/2014   GERD (gastroesophageal reflux disease)    HOH (hard of hearing)    both ears no hearing aides   HYPERCHOLESTEROLEMIA 07/29/2008   HYPERTENSION 03/04/2007   Knee pain, left 05/16/2015   Nonintractable episodic headache 12/01/2016   migraine   SHOULDER PAIN, BILATERAL 01/12/2010   Qualifier: Diagnosis of  By: Washington Hacker MD, Sean A    TUBULOVILLOUS ADENOMA, 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   Qualifier: Diagnosis of  By: Washington Hacker MD, Leota Randy    Past Surgical History: Past Surgical History:  Procedure Laterality Date   CARDIAC CATHETERIZATION     COLONOSCOPY  07/2017   CORONARY STENT INTERVENTION N/A 10/22/2021   Procedure: CORONARY STENT INTERVENTION;  Surgeon:  Kyra Phy, MD;  Location: [facility];  Service: Cardiovascular;  Laterality: N/A;   LEFT HEART CATH AND CORONARY ANGIOGRAPHY N/A 07/12/2018   Procedure: LEFT HEART CATH AND CORONARY ANGIOGRAPHY;  Surgeon: Wenona Hamilton, MD;  Location: [facility];  Service: Cardiovascular;  Laterality: N/A;   LEFT HEART CATH AND CORONARY ANGIOGRAPHY N/A 10/22/2021   Procedure: LEFT HEART CATH AND CORONARY ANGIOGRAPHY;  Surgeon: Kyra Phy, MD;  Location: [facility];  Service: Cardiovascular;  Laterality: N/A;   stress cardiolite  05/30/2003   TRANSURETHRAL RESECTION OF PROSTATE N/A 06/04/2020   Procedure: TRANSURETHRAL RESECTION OF THE PROSTATE (TURP), BIPOLAR;  Surgeon: Adelbert Homans, MD;  Location: [facility];  Service: Urology;  Laterality: N/A;   Social:  Lives With: daughter, son across the street Support: family  Level of Function: independent with ADLs PCP: None Substances: -denies alcohol use or recreational drug use -former smoker 1/2 ppd for 20 years, quit in his 65s   Family History:  Family History  Problem Relation Age of Onset   Cancer Mother        Breast Cancer   Cancer Father        uncertain type   Diabetes Father    Cancer Brother 43       Colon Cancer   Colon cancer Neg Hx    Rectal cancer Neg Hx    Stomach cancer Neg Hx    Allergies: Allergies as of 12/25/2023   (No Known Allergies)    Review of Systems: A complete ROS was negative except as per HPI.   OBJECTIVE:   Physical Exam: Blood pressure (!) 167/74, pulse 74, temperature 98.5 F (36.9 C), resp. rate 18, height 5\' 11"  (1.803 m), weight 78.9 kg, SpO2 99%.  Constitutional: Well-appearing older man, laying in bed in no acute distress, son and daughter at bedside Cardiovascular: regular rate and rhythm, no murmurs appreciated, appears euvolemic on exam, extremities well-perfused Pulmonary/Chest: normal work of breathing on room air, lungs clear to  auscultation bilaterally Abdominal: soft, non-tender, non-distended MSK: tenderness to palpation over left side of chest Neurological: alert & oriented x 3, reports left upper extremity tingling, no other focal neurological deficits Skin: warm and dry Psych: Normal mood and affect  Labs: CBC    Component Value Date/Time   WBC 5.6 12/25/2023 1155   RBC 4.33 12/25/2023 1155   HGB 13.1 12/25/2023 1155   HGB 13.6 07/06/2018 1129   HCT 37.2 (L) 12/25/2023 1155   HCT 39.2 07/06/2018 1129   PLT 167 12/25/2023 1155   PLT 175 07/06/2018 1129   MCV 85.9 12/25/2023 1155   MCV 85 07/06/2018 1129   MCH 30.3 12/25/2023 1155   MCHC  35.2 12/25/2023 1155   RDW 12.3 12/25/2023 1155   RDW 12.8 07/06/2018 1129   LYMPHSABS 2.4 10/22/2021 0100   LYMPHSABS 2.6 07/06/2018 1129   MONOABS 0.4 10/22/2021 0100   EOSABS 0.1 10/22/2021 0100   EOSABS 0.2 07/06/2018 1129   BASOSABS 0.0 10/22/2021 0100   BASOSABS 0.0 07/06/2018 1129     CMP     Component Value Date/Time   NA 133 (L) 12/25/2023 1155   NA 136 07/06/2018 1129   K 4.5 12/25/2023 1155   CL 100 12/25/2023 1155   CO2 21 (L) 12/25/2023 1155   GLUCOSE 578 (HH) 12/25/2023 1155   BUN 27 (H) 12/25/2023 1155   BUN 28 (H) 07/06/2018 1129   CREATININE 1.04 12/25/2023 1155   CALCIUM  9.4 12/25/2023 1155   PROT 6.5 10/22/2021 0100   PROT 7.0 02/27/2021 0727   ALBUMIN 3.6 10/22/2021 0100   ALBUMIN 4.6 02/27/2021 0727   AST 17 10/22/2021 0100   ALT 18 10/22/2021 0100   ALKPHOS 54 10/22/2021 0100   BILITOT 0.6 10/22/2021 0100   BILITOT 0.3 02/27/2021 0727   GFRNONAA >60 12/25/2023 1155   GFRAA >60 04/06/2020 2202    Imaging: DG Chest 2 View CLINICAL DATA:  Chest pain.  EXAM: CHEST - 2 VIEW  COMPARISON:  09/16/2023  FINDINGS: The lungs are clear without focal pneumonia, edema, pneumothorax or pleural effusion. The cardiopericardial silhouette is within normal limits for size. No acute bony abnormality.  IMPRESSION: No active  cardiopulmonary disease.  Electronically Signed   By: Donnal Fusi M.D.   On: 12/25/2023 12:24   EKG: personally reviewed my interpretation is sinus rhythm, with slightly widened QRS and chronic LBBB  ASSESSMENT & PLAN:  Assessment & Plan by Problem: Principal Problem:   Chest pain  Scott Donaldson is a 73 y.o. person living with a history of NSTEMI, CAD, HTN, and diabetes who presented with chest pain and is admitted for cardiac workup on hospital day 0  Chest pain History of NSTEMI s/p circumflex stent (2023) Presentation of chest pain today is consistent with unstable angina.  Previous episodes of pain more consistent with stable angina.  No significant acute ischemic changes on EKG, troponins flat. He is high risk with poorly controlled diabetes, HTN, HLD and significant cardiac history including NSTEMI with a stent placement in 2023.  Pain improved greatly with aspirin  and nitro, currently asymptomatic.  Cardiology consulted in the ED, will comanage alongside them. - Cardiology consulted, appreciate assistance - Cardiac monitoring - Echocardiogram - Heparin  per cardiology - Heart healthy diet; n.p.o. at midnight for possible procedure - Nitroglycerin , tylenol  prn - CBC, BMP, magnesium  in am  Type 2 diabetes Last A1c was 10.4%.  Blood sugars have been historically poorly controlled, worse recently as the patient has not been following up with his primary care provider.  Seems that he has been using insulin  off and on without consistency.  Finances and adherence both seem to be barriers.  Blood sugar is quite elevated at 578 on presentation, no anion gap. - Received 15u short acting insulin  in the ED - Will recheck A1c, lipid panel - Diabetes coordinator consult - CBG QID & HS, SSI  Diet: Heart Healthy; NPO at midnight VTE: Heparin  IVF: None Code: Full Surrogate Decision Maker: daughter (Jaclyn)  Prior to Admission Living Arrangement: Home, living with  daughter Anticipated Discharge Location: Home Barriers to Discharge: Cardiac workup  Dispo: Admit patient to Observation with expected length of stay less than 2 midnights.  Signed: Dorthy Gavia, MD Internal Medicine Resident, PGY-1 [facility]   12/25/2023, 3:30 PM

## 2023-12-25 NOTE — ED Notes (Signed)
 Pt Daughter Scott Donaldson) requesting a call back with update on dad.  call back# [phone]

## 2023-12-25 NOTE — Consult Note (Addendum)
 Cardiology Consultation  Patient ID: [name] MRN: [id number]; DOB: [birth year]  Admit date: 12/25/2023 Date of Consult: 12/25/2023  PCP:  Patient, No Pcp Per   [facility] Providers Cardiologist:  Janelle Mediate, MD     Patient Profile: Scott Donaldson is a 73 y.o. male with a hx of CAD status post DES to LCx, hypertension, hyperlipidemia, BPH status post TURP, diabetes who is being seen 12/25/2023 for the evaluation of chest pain at the request of Dr. Jarvis Mesa.  History of Present Illness: Scott Donaldson is a 73 year old male with past medical history noted above.  He underwent Lexiscan  leading to cardiac catheterization 06/2018 with mild nonobstructive CAD.  Presented 09/2021 with complaints of chest pain and underwent cardiac catheterization showing 90% ostial left circumflex treated with PCI/DES x 1.  Patient was placed on DAPT with recommendations for 1 year.  Echocardiogram at that time showed LVEF of 55 to 60%, grade 1 diastolic dysfunction, normal RV, no significant valvular disease.   Last seen in the office 10/2021 for follow-up since being discharged from the hospital.  He was continued on aspirin , Brilinta , atorvastatin  80 mg daily, Zetia  10 mg daily, Toprol  XL 12.5 mg daily, Farxiga  (patient assistance forms were completed in the office that day).   He was lost to follow-up.  Presented to the ED 6/1 with complaints of chest pain that started this morning around 6 AM.  Reports he was already up and getting up out of bed.  Walked to his recliner and sat down whenever he began to experience centralized chest pressure with radiation into his shoulders which was quite severe in nature.  Called his daughter and came to check on him and noted his blood pressure was significantly elevated.  Given his malingering symptoms she brought him to the ED for further evaluation.  Labs on admission showed sodium 133, potassium 4.5, creatinine 1.04, high-sensitivity troponin 45>> 62, WBC  5.6, hemoglobin 13.1, glucose 578.  EKG showed sinus rhythm, 80 bpm, left bundle branch block, LVH, nonspecific changes.  Admitted to internal medicine for further management.  Cardiology asked to evaluate regarding chest pain.  In talking with patient he reports that his symptoms finally improved after receiving sublingual nitroglycerin , currently chest pain-free.  States he has been out of most of his medications for about 7 to 8 months.  He has been taking insulin  at home and stretching his metformin  dosing to every other day or every third day.  He is on a very limited income and still has to pay for housing, food and his medications.  Past Medical History:  Diagnosis Date   Abdominal pain 08/04/2016   Abnormal PSA 05/19/2015   Anemia    Arthralgia 11/12/2010   Benign prostatic hyperplasia with urinary hesitancy 06/14/2018   Cataract    Coronary artery disease    nonobstructive   Diabetes (HCC) 03/04/2007   Qualifier: Diagnosis of  By: Washington Hacker MD, Sean A    DIABETES MELLITUS, TYPE II 03/04/2007   Elevated LDL cholesterol level 06/14/2018   Foot ulcer, right (HCC) 04/15/2014   GERD (gastroesophageal reflux disease)    HOH (hard of hearing)    both ears no hearing aides   HYPERCHOLESTEROLEMIA 07/29/2008   HYPERTENSION 03/04/2007   Knee pain, left 05/16/2015   Nonintractable episodic headache 12/01/2016   migraine   SHOULDER PAIN, BILATERAL 01/12/2010   Qualifier: Diagnosis of  By: Washington Hacker MD, Sean A    TUBULOVILLOUS ADENOMA, 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   Qualifier: Diagnosis of  By: Washington Hacker MD, Leota Randy     Past Surgical History:  Procedure Laterality Date   CARDIAC CATHETERIZATION     COLONOSCOPY  07/2017   CORONARY STENT INTERVENTION N/A 10/22/2021   Procedure: CORONARY STENT INTERVENTION;  Surgeon: Kyra Phy, MD;  Location: [facility];  Service: Cardiovascular;  Laterality: N/A;   LEFT HEART CATH AND CORONARY ANGIOGRAPHY N/A 07/12/2018   Procedure: LEFT HEART CATH AND CORONARY  ANGIOGRAPHY;  Surgeon: Wenona Hamilton, MD;  Location: [facility];  Service: Cardiovascular;  Laterality: N/A;   LEFT HEART CATH AND CORONARY ANGIOGRAPHY N/A 10/22/2021   Procedure: LEFT HEART CATH AND CORONARY ANGIOGRAPHY;  Surgeon: Kyra Phy, MD;  Location: [facility];  Service: Cardiovascular;  Laterality: N/A;   stress cardiolite  05/30/2003   TRANSURETHRAL RESECTION OF PROSTATE N/A 06/04/2020   Procedure: TRANSURETHRAL RESECTION OF THE PROSTATE (TURP), BIPOLAR;  Surgeon: Adelbert Homans, MD;  Location: [facility];  Service: Urology;  Laterality: N/A;     Scheduled Meds:  insulin  aspart  0-15 Units Subcutaneous TID WC   SOS-AMI placebo for selatogrel for screening use only  0.5 mL Subcutaneous Once   Continuous Infusions:  heparin  1,050 Units/hr (12/25/23 1551)   PRN Meds: acetaminophen  **OR** acetaminophen , nitroGLYCERIN , senna-docusate  Allergies:   No Known Allergies  Social History:   Social History   Socioeconomic History   Marital status: Single    Spouse name: Not on file   Number of children: 2   Years of education: 10   Highest education level: 10th grade  Occupational History   Occupation: roofer   Occupation: Retired  Tobacco Use   Smoking status: Former    Current packs/day: 0.00    Average packs/day: 1.5 packs/day for 25.0 years (37.5 ttl pk-yrs)    Types: Cigarettes    Start date: 07/27/1963    Quit date: 07/26/1988    Years since quitting: 35.4   Smokeless tobacco: Never  Vaping Use   Vaping status: Never Used  Substance and Sexual Activity   Alcohol use: No   Drug use: No   Sexual activity: Not Currently  Other Topics Concern   Not on file  Social History Narrative   Lives with daughter in a one story home.  Has 2 children.  Semi-retired.  Works as a Designer, fashion/clothing.  Education: 10th grade.    Social Drivers of Corporate investment banker Strain: Low Risk  (11/13/2021)   Overall Financial Resource Strain  (CARDIA)    Difficulty of Paying Living Expenses: Not hard at all  Food Insecurity: No Food Insecurity (11/13/2021)   Hunger Vital Sign    Worried About Running Out of Food in the Last Year: Never true    Ran Out of Food in the Last Year: Never true  Transportation Needs: No Transportation Needs (11/13/2021)   PRAPARE - Administrator, Civil Service (Medical): No    Lack of Transportation (Non-Medical): No  Physical Activity: Insufficiently Active (11/13/2021)   Exercise Vital Sign    Days of Exercise per Week: 2 days    Minutes of Exercise per Session: 20 min  Stress: No Stress Concern Present (11/13/2021)   [employer] of Occupational Health - Occupational Stress Questionnaire    Feeling of Stress : Not at all  Social Connections: Moderately Integrated (11/13/2021)   Social Connection and Isolation Panel [NHANES]    Frequency of Communication with Friends and Family: More than three times  a week    Frequency of Social Gatherings with Friends and Family: More than three times a week    Attends Religious Services: 1 to 4 times per year    Active Member of [employer] or Organizations: Yes    Attends Banker Meetings: Never    Marital Status: Never married  Intimate Partner Violence: Not At Risk (11/13/2021)   Humiliation, Afraid, Rape, and Kick questionnaire    Fear of Current or Ex-Partner: No    Emotionally Abused: No    Physically Abused: No    Sexually Abused: No    Family History:    Family History  Problem Relation Age of Onset   Cancer Mother        Breast Cancer   Cancer Father        uncertain type   Diabetes Father    Cancer Brother 53       Colon Cancer   Colon cancer Neg Hx    Rectal cancer Neg Hx    Stomach cancer Neg Hx      ROS:  Please see the history of present illness.   All other ROS reviewed and negative.     Physical Exam/Data: Vitals:   12/25/23 1147 12/25/23 1153 12/25/23 1400 12/25/23 1555  BP: (!) 167/74  (!) 153/76    Pulse: 74  (!) 49   Resp: 18  (!) 23   Temp: 98.5 F (36.9 C)   97.8 F (36.6 C)  TempSrc:    Oral  SpO2: 99%  100%   Weight:  78.9 kg    Height:  5\' 11"  (1.803 m)     No intake or output data in the 24 hours ending 12/25/23 1652    12/25/2023   11:53 AM 09/15/2023    9:03 PM 10/08/2022    3:04 PM  Last 3 Weights  Weight (lbs) 174 lb 177 lb 0.5 oz 177 lb  Weight (kg) 78.926 kg 80.3 kg 80.287 kg     Body mass index is 24.27 kg/m.  General:  Well nourished, well developed, in no acute distress HEENT: normal Neck: no JVD Vascular: No carotid bruits; Distal pulses 2+ bilaterally Cardiac:  normal S1, S2; RRR; no murmur  Lungs:  clear to auscultation bilaterally, no wheezing, rhonchi or rales  Abd: soft, nontender Ext: no edema Musculoskeletal:  No deformities, BUE and BLE strength normal and equal Skin: warm and dry  Neuro:  No focal abnormalities noted Psych:  Normal affect   EKG:  The EKG was personally reviewed and demonstrates:  sinus rhythm, 80 bpm, left bundle branch block, LVH, nonspecific changes Telemetry:  Telemetry was personally reviewed and demonstrates: Sinus bradycardia, mostly in the 50s  Relevant CV Studies:  Echo: 09/2021  IMPRESSIONS     1. Left ventricular ejection fraction, by estimation, is 55 to 60%. The  left ventricle has normal function. The left ventricle has no regional  wall motion abnormalities. Left ventricular diastolic parameters are  consistent with Grade I diastolic  dysfunction (impaired relaxation). Elevated left atrial pressure.   2. Right ventricular systolic function is normal. The right ventricular  size is normal.   3. The mitral valve is normal in structure. Trivial mitral valve  regurgitation. No evidence of mitral stenosis.   4. The aortic valve is tricuspid. Aortic valve regurgitation is not  visualized. Aortic valve sclerosis is present, with no evidence of aortic  valve stenosis.   5. The inferior vena cava is normal in  size  with greater than 50%  respiratory variability, suggesting right atrial pressure of 3 mmHg.   FINDINGS   Left Ventricle: Left ventricular ejection fraction, by estimation, is 55  to 60%. The left ventricle has normal function. The left ventricle has no  regional wall motion abnormalities. The left ventricular internal cavity  size was normal in size. There is   no left ventricular hypertrophy. Left ventricular diastolic parameters  are consistent with Grade I diastolic dysfunction (impaired relaxation).  Elevated left atrial pressure.   Right Ventricle: The right ventricular size is normal. Right ventricular  systolic function is normal.   Left Atrium: Left atrial size was normal in size.   Right Atrium: Right atrial size was normal in size.   Pericardium: There is no evidence of pericardial effusion.   Mitral Valve: The mitral valve is normal in structure. Mild mitral annular  calcification. Trivial mitral valve regurgitation. No evidence of mitral  valve stenosis.   Tricuspid Valve: The tricuspid valve is normal in structure. Tricuspid  valve regurgitation is trivial. No evidence of tricuspid stenosis.   Aortic Valve: The aortic valve is tricuspid. Aortic valve regurgitation is  not visualized. Aortic valve sclerosis is present, with no evidence of  aortic valve stenosis.   Pulmonic Valve: The pulmonic valve was not well visualized. Pulmonic valve  regurgitation is not visualized. No evidence of pulmonic stenosis.   Aorta: The aortic root is normal in size and structure.   Venous: The inferior vena cava is normal in size with greater than 50%  respiratory variability, suggesting right atrial pressure of 3 mmHg.   IAS/Shunts: No atrial level shunt detected by color flow Doppler.   Cath: 09/2021    Dist LM lesion is 20% stenosed.   Mid LAD lesion is 40% stenosed.   Prox RCA lesion is 20% stenosed.   Dist RCA lesion is 40% stenosed.   Ost Cx lesion is 90%  stenosed.   A stent was successfully placed.   Post intervention, there is a 0% residual stenosis.   1.  High-grade ostial left circumflex lesion treated with 1 drug-eluting stent with mild to moderate diffuse disease elsewhere.   Recommendation: Dual antiplatelet therapy for 1 year and consider conversion to Plavix monotherapy indefinitely thereafter.    Laboratory Data: High Sensitivity Troponin:   Recent Labs  Lab 12/25/23 1155 12/25/23 1245  TROPONINIHS 45* 62*     Chemistry Recent Labs  Lab 12/25/23 1155  NA 133*  K 4.5  CL 100  CO2 21*  GLUCOSE 578*  BUN 27*  CREATININE 1.04  CALCIUM  9.4  GFRNONAA >60  ANIONGAP 12    No results for input(s): "PROT", "ALBUMIN", "AST", "ALT", "ALKPHOS", "BILITOT" in the last 168 hours. Lipids No results for input(s): "CHOL", "TRIG", "HDL", "LABVLDL", "LDLCALC", "CHOLHDL" in the last 168 hours.  Hematology Recent Labs  Lab 12/25/23 1155  WBC 5.6  RBC 4.33  HGB 13.1  HCT 37.2*  MCV 85.9  MCH 30.3  MCHC 35.2  RDW 12.3  PLT 167   Thyroid  No results for input(s): "TSH", "FREET4" in the last 168 hours.  BNPNo results for input(s): "BNP", "PROBNP" in the last 168 hours.  DDimer No results for input(s): "DDIMER" in the last 168 hours.  Radiology/Studies:  DG Chest 2 View Result Date: 12/25/2023 CLINICAL DATA:  Chest pain. EXAM: CHEST - 2 VIEW COMPARISON:  09/16/2023 FINDINGS: The lungs are clear without focal pneumonia, edema, pneumothorax or pleural effusion. The cardiopericardial silhouette is within normal limits for  size. No acute bony abnormality. IMPRESSION: No active cardiopulmonary disease. Electronically Signed   By: Donnal Fusi M.D.   On: 12/25/2023 12:24     Assessment and Plan:  GAIL CREEKMORE is a 73 y.o. male with a hx of CAD status post DES to LCx, hypertension, hyperlipidemia, BPH status post TURP, diabetes who is being seen 12/25/2023 for the evaluation of chest pain at the request of Dr. Jarvis Mesa.  Unstable  angina CAD s/p DES to Lcx '23 -- Presented with centralized chest pain that radiated into his shoulders.  This lingered for several hours and ultimately presented to the ED.  While in the ED he received sublingual nitroglycerin  with resolution of symptoms.  High-sensitivity troponin 45>> 62.  EKG without acute ischemic changes.  -- unfortunately he has been out of all of his medication with exception of insulin  for about 7 to 8 months due to financial issues -- Continue IV heparin , start aspirin , atorvastatin  80 mg daily -- Check echocardiogram -- Given symptoms concerning for unstable angina we will plan for cardiac catheterization tomorrow  Informed Consent   Shared Decision Making/Informed Consent{  The risks [stroke (1 in 1000), death (1 in 1000), kidney failure [usually temporary] (1 in 500), bleeding (1 in 200), allergic reaction [possibly serious] (1 in 200)], benefits (diagnostic support and management of coronary artery disease) and alternatives of a cardiac catheterization were discussed in detail with Scott Donaldson and he is willing to proceed.    Hypertension -- Elevated while in the ED -- Has not been on any therapy prior to admission, will add losartan  25 mg daily.  No beta-blocker at this time due to baseline bradycardia  Hyperlipidemia -- Not on statin PTA -- Check lipids -- Resume atorvastatin  80 mg daily  Poorly controlled diabetes -- Hemoglobin A1c 13.3 -- Has been using home mixed insulin  from [employer], spreading his metformin  dosing out to every other or every third day -- Management per primary  He does report financial issues with being able to obtain medications given he is on a fixed income with other bills.  Will obtain case management consult.   Risk Assessment/Risk Scores:  TIMI Risk Score for Unstable Angina or Non-ST Elevation MI:   The patient's TIMI risk score is 4, which indicates a 20% risk of all cause mortality, new or recurrent myocardial infarction  or need for urgent revascularization in the next 14 days.{  For questions or updates, please contact [facility] Please consult [email or web address] for contact info under    Signed, Johnie Nailer, NP  12/25/2023 4:52 PM  History and all data above reviewed.  Patient examined.  I agree with the findings as above.   The patient has not had any medical care in a while.  Has been out of his medicines for a long time.  He does take insulin  but his diabetes is not very well-controlled.  He has no medication coverage so he is let his meds lapse.  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  He might get some intermittent chest discomfort.  He cannot really quantify or qualify this.  However, this morning his chest discomfort started and became 8 out of 10 in intensity.  He thinks it is similar to his previous angina.  It radiates to his left shoulder.  It is happening at rest.  It did go away once he got nitroglycerin  in the emergency room.  Does not describe any associated nausea vomiting or diaphoresis.  Has not been having any new shortness of breath, PND or orthopnea.  He has had no weight gain or edema.  The patient exam reveals COR: Regular rate and rhythm, 2 out of 6 apical systolic murmur radiating at the aortic outflow tract and early peaking, no diastolic murmurs,  Lungs: Clear to auscultation bilaterally,  Abd: Positive bowel sounds normal frequency pitch, bruits, rebound, guarding, Ext 2+ pulses, no edema..  All available labs, radiology testing, previous records reviewed. Agree with documented assessment and plan.  Chest pain: This is consistent with unstable angina.  He needs heparin .  If he has recurrent pain, started on IV nitroglycerin .  Meds as listed above.  Cardiac catheterization is indicated.  Diabetes mellitus: Per the primary team.  He needs aggressive management and needs a caseworker to help him obtain his medications.   Hypertension: Losartan  added above.  No  beta-blocker secondary bradycardia.  Dyslipidemia goal LDL should be in the 50s.  Started on statin.  Scott Donaldson Scott Donaldson  5:21 PM  12/25/2023

## 2023-12-25 NOTE — H&P (View-Only) (Signed)
 Cardiology Consultation  Patient ID: Scott Donaldson MRN: [id number]; DOB: 1951-05-27  Admit date: 12/25/2023 Date of Consult: 12/25/2023  PCP:  Patient, No Pcp Per   [facility] Providers Cardiologist:  Janelle Mediate, MD     Patient Profile: Scott Donaldson is a 73 y.o. male with a hx of CAD status post DES to LCx, hypertension, hyperlipidemia, BPH status post TURP, diabetes who is being seen 12/25/2023 for the evaluation of chest pain at the request of Dr. Jarvis Mesa.  History of Present Illness: Scott Donaldson is a 72 year old male with past medical history noted above.  He underwent Lexiscan  leading to cardiac catheterization 06/2018 with mild nonobstructive CAD.  Presented 09/2021 with complaints of chest pain and underwent cardiac catheterization showing 90% ostial left circumflex treated with PCI/DES x 1.  Patient was placed on DAPT with recommendations for 1 year.  Echocardiogram at that time showed LVEF of 55 to 60%, grade 1 diastolic dysfunction, normal RV, no significant valvular disease.   Last seen in the office 10/2021 for follow-up since being discharged from the hospital.  He was continued on aspirin , Brilinta , atorvastatin  80 mg daily, Zetia  10 mg daily, Toprol  XL 12.5 mg daily, Farxiga  (patient assistance forms were completed in the office that day).   He was lost to follow-up.  Presented to the ED 6/1 with complaints of chest pain that started this morning around 6 AM.  Reports he was already up and getting up out of bed.  Walked to his recliner and sat down whenever he began to experience centralized chest pressure with radiation into his shoulders which was quite severe in nature.  Called his daughter and came to check on him and noted his blood pressure was significantly elevated.  Given his malingering symptoms she brought him to the ED for further evaluation.  Labs on admission showed sodium 133, potassium 4.5, creatinine 1.04, high-sensitivity troponin 45>> 62, WBC  5.6, hemoglobin 13.1, glucose 578.  EKG showed sinus rhythm, 80 bpm, left bundle branch block, LVH, nonspecific changes.  Admitted to internal medicine for further management.  Cardiology asked to evaluate regarding chest pain.  In talking with patient he reports that his symptoms finally improved after receiving sublingual nitroglycerin , currently chest pain-free.  States he has been out of most of his medications for about 7 to 8 months.  He has been taking insulin  at home and stretching his metformin  dosing to every other day or every third day.  He is on a very limited income and still has to pay for housing, food and his medications.  Past Medical History:  Diagnosis Date   Abdominal pain 08/04/2016   Abnormal PSA 05/19/2015   Anemia    Arthralgia 11/12/2010   Benign prostatic hyperplasia with urinary hesitancy 06/14/2018   Cataract    Coronary artery disease    nonobstructive   Diabetes (HCC) 03/04/2007   Qualifier: Diagnosis of  By: Washington Hacker MD, Sean A    DIABETES MELLITUS, TYPE II 03/04/2007   Elevated LDL cholesterol level 06/14/2018   Foot ulcer, right (HCC) 04/15/2014   GERD (gastroesophageal reflux disease)    HOH (hard of hearing)    both ears no hearing aides   HYPERCHOLESTEROLEMIA 07/29/2008   HYPERTENSION 03/04/2007   Knee pain, left 05/16/2015   Nonintractable episodic headache 12/01/2016   migraine   SHOULDER PAIN, BILATERAL 01/12/2010   Qualifier: Diagnosis of  By: Washington Hacker MD, Sean A    TUBULOVILLOUS ADENOMA, 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   Qualifier: Diagnosis of  By: Washington Hacker MD, Leota Randy     Past Surgical History:  Procedure Laterality Date   CARDIAC CATHETERIZATION     COLONOSCOPY  07/2017   CORONARY STENT INTERVENTION N/A 10/22/2021   Procedure: CORONARY STENT INTERVENTION;  Surgeon: Kyra Phy, MD;  Location: [facility];  Service: Cardiovascular;  Laterality: N/A;   LEFT HEART CATH AND CORONARY ANGIOGRAPHY N/A 07/12/2018   Procedure: LEFT HEART CATH AND CORONARY  ANGIOGRAPHY;  Surgeon: Wenona Hamilton, MD;  Location: [facility];  Service: Cardiovascular;  Laterality: N/A;   LEFT HEART CATH AND CORONARY ANGIOGRAPHY N/A 10/22/2021   Procedure: LEFT HEART CATH AND CORONARY ANGIOGRAPHY;  Surgeon: Kyra Phy, MD;  Location: [facility];  Service: Cardiovascular;  Laterality: N/A;   stress cardiolite  05/30/2003   TRANSURETHRAL RESECTION OF PROSTATE N/A 06/04/2020   Procedure: TRANSURETHRAL RESECTION OF THE PROSTATE (TURP), BIPOLAR;  Surgeon: Adelbert Homans, MD;  Location: [facility];  Service: Urology;  Laterality: N/A;     Scheduled Meds:  insulin  aspart  0-15 Units Subcutaneous TID WC   SOS-AMI placebo for selatogrel for screening use only  0.5 mL Subcutaneous Once   Continuous Infusions:  heparin  1,050 Units/hr (12/25/23 1551)   PRN Meds: acetaminophen  **OR** acetaminophen , nitroGLYCERIN , senna-docusate  Allergies:   No Known Allergies  Social History:   Social History   Socioeconomic History   Marital status: Single    Spouse name: Not on file   Number of children: 2   Years of education: 10   Highest education level: 10th grade  Occupational History   Occupation: roofer   Occupation: Retired  Tobacco Use   Smoking status: Former    Current packs/day: 0.00    Average packs/day: 1.5 packs/day for 25.0 years (37.5 ttl pk-yrs)    Types: Cigarettes    Start date: 07/27/1963    Quit date: 07/26/1988    Years since quitting: 35.4   Smokeless tobacco: Never  Vaping Use   Vaping status: Never Used  Substance and Sexual Activity   Alcohol use: No   Drug use: No   Sexual activity: Not Currently  Other Topics Concern   Not on file  Social History Narrative   Lives with daughter in a one story home.  Has 2 children.  Semi-retired.  Works as a Designer, fashion/clothing.  Education: 10th grade.    Social Drivers of Corporate investment banker Strain: Low Risk  (11/13/2021)   Overall Financial Resource Strain  (CARDIA)    Difficulty of Paying Living Expenses: Not hard at all  Food Insecurity: No Food Insecurity (11/13/2021)   Hunger Vital Sign    Worried About Running Out of Food in the Last Year: Never true    Ran Out of Food in the Last Year: Never true  Transportation Needs: No Transportation Needs (11/13/2021)   PRAPARE - Administrator, Civil Service (Medical): No    Lack of Transportation (Non-Medical): No  Physical Activity: Insufficiently Active (11/13/2021)   Exercise Vital Sign    Days of Exercise per Week: 2 days    Minutes of Exercise per Session: 20 min  Stress: No Stress Concern Present (11/13/2021)   [employer] of Occupational Health - Occupational Stress Questionnaire    Feeling of Stress : Not at all  Social Connections: Moderately Integrated (11/13/2021)   Social Connection and Isolation Panel [NHANES]    Frequency of Communication with Friends and Family: More than three times  a week    Frequency of Social Gatherings with Friends and Family: More than three times a week    Attends Religious Services: 1 to 4 times per year    Active Member of [employer] or Organizations: Yes    Attends Banker Meetings: Never    Marital Status: Never married  Intimate Partner Violence: Not At Risk (11/13/2021)   Humiliation, Afraid, Rape, and Kick questionnaire    Fear of Current or Ex-Partner: No    Emotionally Abused: No    Physically Abused: No    Sexually Abused: No    Family History:    Family History  Problem Relation Age of Onset   Cancer Mother        Breast Cancer   Cancer Father        uncertain type   Diabetes Father    Cancer Brother 53       Colon Cancer   Colon cancer Neg Hx    Rectal cancer Neg Hx    Stomach cancer Neg Hx      ROS:  Please see the history of present illness.   All other ROS reviewed and negative.     Physical Exam/Data: Vitals:   12/25/23 1147 12/25/23 1153 12/25/23 1400 12/25/23 1555  BP: (!) 167/74  (!) 153/76    Pulse: 74  (!) 49   Resp: 18  (!) 23   Temp: 98.5 F (36.9 C)   97.8 F (36.6 C)  TempSrc:    Oral  SpO2: 99%  100%   Weight:  78.9 kg    Height:  5\' 11"  (1.803 m)     No intake or output data in the 24 hours ending 12/25/23 1652    12/25/2023   11:53 AM 09/15/2023    9:03 PM 10/08/2022    3:04 PM  Last 3 Weights  Weight (lbs) 174 lb 177 lb 0.5 oz 177 lb  Weight (kg) 78.926 kg 80.3 kg 80.287 kg     Body mass index is 24.27 kg/m.  General:  Well nourished, well developed, in no acute distress HEENT: normal Neck: no JVD Vascular: No carotid bruits; Distal pulses 2+ bilaterally Cardiac:  normal S1, S2; RRR; no murmur  Lungs:  clear to auscultation bilaterally, no wheezing, rhonchi or rales  Abd: soft, nontender Ext: no edema Musculoskeletal:  No deformities, BUE and BLE strength normal and equal Skin: warm and dry  Neuro:  No focal abnormalities noted Psych:  Normal affect   EKG:  The EKG was personally reviewed and demonstrates:  sinus rhythm, 80 bpm, left bundle branch block, LVH, nonspecific changes Telemetry:  Telemetry was personally reviewed and demonstrates: Sinus bradycardia, mostly in the 50s  Relevant CV Studies:  Echo: 09/2021  IMPRESSIONS     1. Left ventricular ejection fraction, by estimation, is 55 to 60%. The  left ventricle has normal function. The left ventricle has no regional  wall motion abnormalities. Left ventricular diastolic parameters are  consistent with Grade I diastolic  dysfunction (impaired relaxation). Elevated left atrial pressure.   2. Right ventricular systolic function is normal. The right ventricular  size is normal.   3. The mitral valve is normal in structure. Trivial mitral valve  regurgitation. No evidence of mitral stenosis.   4. The aortic valve is tricuspid. Aortic valve regurgitation is not  visualized. Aortic valve sclerosis is present, with no evidence of aortic  valve stenosis.   5. The inferior vena cava is normal in  size  with greater than 50%  respiratory variability, suggesting right atrial pressure of 3 mmHg.   FINDINGS   Left Ventricle: Left ventricular ejection fraction, by estimation, is 55  to 60%. The left ventricle has normal function. The left ventricle has no  regional wall motion abnormalities. The left ventricular internal cavity  size was normal in size. There is   no left ventricular hypertrophy. Left ventricular diastolic parameters  are consistent with Grade I diastolic dysfunction (impaired relaxation).  Elevated left atrial pressure.   Right Ventricle: The right ventricular size is normal. Right ventricular  systolic function is normal.   Left Atrium: Left atrial size was normal in size.   Right Atrium: Right atrial size was normal in size.   Pericardium: There is no evidence of pericardial effusion.   Mitral Valve: The mitral valve is normal in structure. Mild mitral annular  calcification. Trivial mitral valve regurgitation. No evidence of mitral  valve stenosis.   Tricuspid Valve: The tricuspid valve is normal in structure. Tricuspid  valve regurgitation is trivial. No evidence of tricuspid stenosis.   Aortic Valve: The aortic valve is tricuspid. Aortic valve regurgitation is  not visualized. Aortic valve sclerosis is present, with no evidence of  aortic valve stenosis.   Pulmonic Valve: The pulmonic valve was not well visualized. Pulmonic valve  regurgitation is not visualized. No evidence of pulmonic stenosis.   Aorta: The aortic root is normal in size and structure.   Venous: The inferior vena cava is normal in size with greater than 50%  respiratory variability, suggesting right atrial pressure of 3 mmHg.   IAS/Shunts: No atrial level shunt detected by color flow Doppler.   Cath: 09/2021    Dist LM lesion is 20% stenosed.   Mid LAD lesion is 40% stenosed.   Prox RCA lesion is 20% stenosed.   Dist RCA lesion is 40% stenosed.   Ost Cx lesion is 90%  stenosed.   A stent was successfully placed.   Post intervention, there is a 0% residual stenosis.   1.  High-grade ostial left circumflex lesion treated with 1 drug-eluting stent with mild to moderate diffuse disease elsewhere.   Recommendation: Dual antiplatelet therapy for 1 year and consider conversion to Plavix monotherapy indefinitely thereafter.    Laboratory Data: High Sensitivity Troponin:   Recent Labs  Lab 12/25/23 1155 12/25/23 1245  TROPONINIHS 45* 62*     Chemistry Recent Labs  Lab 12/25/23 1155  NA 133*  K 4.5  CL 100  CO2 21*  GLUCOSE 578*  BUN 27*  CREATININE 1.04  CALCIUM  9.4  GFRNONAA >60  ANIONGAP 12    No results for input(s): "PROT", "ALBUMIN", "AST", "ALT", "ALKPHOS", "BILITOT" in the last 168 hours. Lipids No results for input(s): "CHOL", "TRIG", "HDL", "LABVLDL", "LDLCALC", "CHOLHDL" in the last 168 hours.  Hematology Recent Labs  Lab 12/25/23 1155  WBC 5.6  RBC 4.33  HGB 13.1  HCT 37.2*  MCV 85.9  MCH 30.3  MCHC 35.2  RDW 12.3  PLT 167   Thyroid  No results for input(s): "TSH", "FREET4" in the last 168 hours.  BNPNo results for input(s): "BNP", "PROBNP" in the last 168 hours.  DDimer No results for input(s): "DDIMER" in the last 168 hours.  Radiology/Studies:  DG Chest 2 View Result Date: 12/25/2023 CLINICAL DATA:  Chest pain. EXAM: CHEST - 2 VIEW COMPARISON:  09/16/2023 FINDINGS: The lungs are clear without focal pneumonia, edema, pneumothorax or pleural effusion. The cardiopericardial silhouette is within normal limits for  size. No acute bony abnormality. IMPRESSION: No active cardiopulmonary disease. Electronically Signed   By: Donnal Fusi M.D.   On: 12/25/2023 12:24     Assessment and Plan:  Scott Donaldson is a 73 y.o. male with a hx of CAD status post DES to LCx, hypertension, hyperlipidemia, BPH status post TURP, diabetes who is being seen 12/25/2023 for the evaluation of chest pain at the request of Dr. Jarvis Mesa.  Unstable  angina CAD s/p DES to Lcx '23 -- Presented with centralized chest pain that radiated into his shoulders.  This lingered for several hours and ultimately presented to the ED.  While in the ED he received sublingual nitroglycerin  with resolution of symptoms.  High-sensitivity troponin 45>> 62.  EKG without acute ischemic changes.  -- unfortunately he has been out of all of his medication with exception of insulin  for about 7 to 8 months due to financial issues -- Continue IV heparin , start aspirin , atorvastatin  80 mg daily -- Check echocardiogram -- Given symptoms concerning for unstable angina we will plan for cardiac catheterization tomorrow  Informed Consent   Shared Decision Making/Informed Consent{  The risks [stroke (1 in 1000), death (1 in 1000), kidney failure [usually temporary] (1 in 500), bleeding (1 in 200), allergic reaction [possibly serious] (1 in 200)], benefits (diagnostic support and management of coronary artery disease) and alternatives of a cardiac catheterization were discussed in detail with Mr. Goldston and he is willing to proceed.    Hypertension -- Elevated while in the ED -- Has not been on any therapy prior to admission, will add losartan  25 mg daily.  No beta-blocker at this time due to baseline bradycardia  Hyperlipidemia -- Not on statin PTA -- Check lipids -- Resume atorvastatin  80 mg daily  Poorly controlled diabetes -- Hemoglobin A1c 13.3 -- Has been using home mixed insulin  from [employer], spreading his metformin  dosing out to every other or every third day -- Management per primary  He does report financial issues with being able to obtain medications given he is on a fixed income with other bills.  Will obtain case management consult.   Risk Assessment/Risk Scores:  TIMI Risk Score for Unstable Angina or Non-ST Elevation MI:   The patient's TIMI risk score is 4, which indicates a 20% risk of all cause mortality, new or recurrent myocardial infarction  or need for urgent revascularization in the next 14 days.{  For questions or updates, please contact [facility] Please consult [email or web address] for contact info under    Signed, Johnie Nailer, NP  12/25/2023 4:52 PM  History and all data above reviewed.  Patient examined.  I agree with the findings as above.   The patient has not had any medical care in a while.  Has been out of his medicines for a long time.  He does take insulin  but his diabetes is not very well-controlled.  He has no medication coverage so he is let his meds lapse.  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  He might get some intermittent chest discomfort.  He cannot really quantify or qualify this.  However, this morning his chest discomfort started and became 8 out of 10 in intensity.  He thinks it is similar to his previous angina.  It radiates to his left shoulder.  It is happening at rest.  It did go away once he got nitroglycerin  in the emergency room.  Does not describe any associated nausea vomiting or diaphoresis.  Has not been having any new shortness of breath, PND or orthopnea.  He has had no weight gain or edema.  The patient exam reveals COR: Regular rate and rhythm, 2 out of 6 apical systolic murmur radiating at the aortic outflow tract and early peaking, no diastolic murmurs,  Lungs: Clear to auscultation bilaterally,  Abd: Positive bowel sounds normal frequency pitch, bruits, rebound, guarding, Ext 2+ pulses, no edema..  All available labs, radiology testing, previous records reviewed. Agree with documented assessment and plan.  Chest pain: This is consistent with unstable angina.  He needs heparin .  If he has recurrent pain, started on IV nitroglycerin .  Meds as listed above.  Cardiac catheterization is indicated.  Diabetes mellitus: Per the primary team.  He needs aggressive management and needs a caseworker to help him obtain his medications.   Hypertension: Losartan  added above.  No  beta-blocker secondary bradycardia.  Dyslipidemia goal LDL should be in the 50s.  Started on statin.  Royston Cornea Adilynn Bessey  5:21 PM  12/25/2023

## 2023-12-25 NOTE — ED Provider Notes (Signed)
 [place] EMERGENCY DEPARTMENT AT [facility] Provider Note   CSN: [id number] Arrival date & time: 12/25/23  1137     History  Chief Complaint  Patient presents with   Chest Pain    XAIDEN FLEIG is a 73 y.o. male.  HPI 73 year old male presents with chest pain.  Patient has multiple medical problems including CAD, hypertension, diabetes.  Patient has been dealing with some chest pain when he walks that gets better when he rests for months.  Today however, shortly after waking up he walked to his recliner and felt recurrent sharp chest pain in his left chest.  However this pain has not gone away.  It is better than when it first occurred but still about a 5 out of 10.  Pain radiates down his left arm and his arm feels somewhat on and off numb.  Radiates into his left neck at times as well.  He is also having a headache today.  No new leg symptoms.  Due to finances, he has not been on his meds for months.  At first he tried to stretch out his medicines by taking them every other day or so but now he is completely out, though he did recently get some over-the-counter insulin  for his diabetes.  Home Medications Prior to Admission medications   Medication Sig Start Date End Date Taking? Authorizing Provider  Accu-Chek Softclix Lancets lancets Check blood sugar 2 times daily 01/20/22   Emilie Harden, MD  amLODipine  (NORVASC ) 5 MG tablet TAKE 1 TABLET(5 MG) BY MOUTH DAILY 04/06/23   Tonna Frederic, MD  amoxicillin  (AMOXIL ) 500 MG capsule Take 500 mg by mouth 3 (three) times daily. 12/28/21   [provider]  aspirin  EC 81 MG tablet Take 81 mg by mouth daily. Swallow whole.    [provider]  atorvastatin  (LIPITOR) 80 MG tablet Take 1 tablet (80 mg total) by mouth daily. 04/06/23 05/06/23  Tonna Frederic, MD  benzonatate  (TESSALON ) 100 MG capsule Take 1 capsule (100 mg total) by mouth every 8 (eight) hours. 09/16/23   Coretha Dew, PA-C  Blood  Glucose Monitoring Suppl (ACCU-CHEK GUIDE ME) w/Device KIT Check blood sugars 2 times daily 01/20/22   Emilie Harden, MD  ezetimibe  (ZETIA ) 10 MG tablet Take 1 tablet (10 mg total) by mouth daily. 10/23/21 01/21/22  Bary Boss, DO  glucose blood (ACCU-CHEK GUIDE) test strip Check blood sugars 2 times daily 01/20/22   Emilie Harden, MD  glucose blood test strip 1 each by Other route 2 (two) times daily. And lancets 2/day 08/21/21   Gwyndolyn Lerner, MD  insulin  glargine (LANTUS  SOLOSTAR) 100 UNIT/ML Solostar Pen Inject 30 Units into the skin at bedtime. 03/30/23   Emilie Harden, MD  Insulin  Lispro-aabc (LYUMJEV  KWIKPEN) 100 UNIT/ML KwikPen Inject 8-12 Units into the skin 3 (three) times daily before meals. 03/30/23   Emilie Harden, MD  Iron , Ferrous Sulfate , 325 (65 Fe) MG TABS Take one daily Patient taking differently: Take 325 mg by mouth daily. Take one daily 04/18/20   Tonna Frederic, MD  losartan  (COZAAR ) 50 MG tablet Take 1 tablet (50 mg total) by mouth daily. 04/06/23 05/06/23  Tonna Frederic, MD  magnesium  oxide (MAG-OX) 400 (240 Mg) MG tablet Take 1 tablet by mouth daily. 10/23/21   [provider]  metFORMIN  (GLUCOPHAGE -XR) 500 MG 24 hr tablet Take 1 tablet (500 mg total) by mouth daily with breakfast. 10/08/22   Emilie Harden, MD  metoprolol   succinate (TOPROL -XL) 25 MG 24 hr tablet Take 0.5 tablets (12.5 mg total) by mouth daily for 30 doses. 04/06/23 05/06/23  Tonna Frederic, MD  nitroGLYCERIN  (NITROSTAT ) 0.4 MG SL tablet Place 1 tablet (0.4 mg total) under the tongue every 5 (five) minutes as needed for chest pain. 11/03/21 02/01/22  Clearnce Curia, NP  ondansetron  (ZOFRAN -ODT) 4 MG disintegrating tablet Take 1 tablet (4 mg total) by mouth every 8 (eight) hours as needed. 09/16/23   Coretha Dew, PA-C  oseltamivir  (TAMIFLU ) 75 MG capsule Take 1 capsule (75 mg total) by mouth every 12 (twelve) hours. 09/16/23   Coretha Dew, PA-C  Study - SOS-AMI  - selatogrel 16 mg/0.5 mL or placebo SQ injection (PI-Christopher) Inject 16 mg into the skin once. For Investigational Use Only. Inject 0.5 mL subcutaneously into the abdomen or thigh if experiencing symptoms of a heart attack. Call 911 immediately and seek emergency medical support. Please contact [facility] with any questions or concerns regarding this medication.    Sheryle Donning, MD  Study - SOS-AMI - selatogrel 16 mg/0.5 mL or placebo SQ injection (PI-Christopher) Inject 0.5 mLs (16 mg total) into the skin once for 1 dose. For Investigational Use Only. Inject 0.5 mL subcutaneously into the abdomen or thigh if experiencing symptoms of a heart attack. Call 911 immediately and seek emergency medical support. 06/01/22 06/01/22  Sheryle Donning, MD      Allergies    Patient has no known allergies.    Review of Systems   Review of Systems  Constitutional:  Negative for diaphoresis.  Respiratory:  Positive for shortness of breath.   Cardiovascular:  Positive for chest pain. Negative for leg swelling.  Gastrointestinal:  Negative for vomiting.  Musculoskeletal:  Negative for back pain.    Physical Exam Updated Vital Signs BP (!) 153/76   Pulse (!) 49   Temp 98.5 F (36.9 C)   Resp (!) 23   Ht 5\' 11"  (1.803 m)   Wt 78.9 kg   SpO2 100%   BMI 24.27 kg/m  Physical Exam Vitals and nursing note reviewed.  Constitutional:      General: He is not in acute distress.    Appearance: He is well-developed. He is not ill-appearing or diaphoretic.  HENT:     Head: Normocephalic and atraumatic.  Cardiovascular:     Rate and Rhythm: Normal rate and regular rhythm.     Pulses:          Radial pulses are 2+ on the right side and 2+ on the left side.       Dorsalis pedis pulses are 2+ on the right side and 2+ on the left side.     Heart sounds: Normal heart sounds.  Pulmonary:     Effort: Pulmonary effort is normal.     Breath sounds: Normal breath sounds.   Abdominal:     Palpations: Abdomen is soft.     Tenderness: There is no abdominal tenderness.  Musculoskeletal:     Right lower leg: No edema.     Left lower leg: No edema.  Skin:    General: Skin is warm and dry.  Neurological:     Mental Status: He is alert.     ED Results / Procedures / Treatments   Labs (all labs ordered are listed, but only abnormal results are displayed) Labs Reviewed  BASIC METABOLIC PANEL WITH GFR - Abnormal; Notable for the following components:      Result Value  Sodium 133 (*)    CO2 21 (*)    Glucose, Bld 578 (*)    BUN 27 (*)    All other components within normal limits  CBC - Abnormal; Notable for the following components:   HCT 37.2 (*)    All other components within normal limits  TROPONIN I (HIGH SENSITIVITY) - Abnormal; Notable for the following components:   Troponin I (High Sensitivity) 45 (*)    All other components within normal limits  TROPONIN I (HIGH SENSITIVITY)    EKG EKG Interpretation Date/Time:  Sunday December 25 2023 11:44:46 EDT Ventricular Rate:  80 PR Interval:  166 QRS Duration:  128 QT Interval:  398 QTC Calculation: 459 R Axis:   -72  Text Interpretation: Normal sinus rhythm Left axis deviation Left ventricular hypertrophy with QRS widening and repolarization abnormality ( R in aVL , Cornell product , Romhilt-Estes ) Cannot rule out Anterior infarct , age undetermined  no significant change since Feb 2025 Confirmed by Jerilynn Montenegro [phone]) on 12/25/2023 12:06:05 PM  Radiology DG Chest 2 View Result Date: 12/25/2023 CLINICAL DATA:  Chest pain. EXAM: CHEST - 2 VIEW COMPARISON:  09/16/2023 FINDINGS: The lungs are clear without focal pneumonia, edema, pneumothorax or pleural effusion. The cardiopericardial silhouette is within normal limits for size. No acute bony abnormality. IMPRESSION: No active cardiopulmonary disease. Electronically Signed   By: Donnal Fusi M.D.   On: 12/25/2023 12:24    Procedures .Critical  Care  Performed by: Jerilynn Montenegro, MD Authorized by: Jerilynn Montenegro, MD   Critical care provider statement:    Critical care time (minutes):  30   Critical care time was exclusive of:  Separately billable procedures and treating other patients   Critical care was necessary to treat or prevent imminent or life-threatening deterioration of the following conditions:  Cardiac failure   Critical care was time spent personally by me on the following activities:  Development of treatment plan with patient or surrogate, discussions with consultants, evaluation of patient's response to treatment, examination of patient, ordering and review of laboratory studies, ordering and review of radiographic studies, ordering and performing treatments and interventions, pulse oximetry, re-evaluation of patient's condition and review of old charts     Medications Ordered in ED Medications  nitroGLYCERIN  (NITROSTAT ) SL tablet 0.4 mg (0.4 mg Sublingual Given 12/25/23 1324)  aspirin  chewable tablet 324 mg (324 mg Oral Given 12/25/23 1323)  acetaminophen  (TYLENOL ) tablet 650 mg (650 mg Oral Given 12/25/23 1324)  lactated ringers  bolus 1,000 mL (1,000 mLs Intravenous New Bag/Given 12/25/23 1340)  insulin  aspart (novoLOG ) injection 15 Units (15 Units Subcutaneous Given 12/25/23 1337)    ED Course/ Medical Decision Making/ A&P                                 Medical Decision Making Amount and/or Complexity of Data Reviewed Labs: ordered.    Details: First troponin 45.  Normal hemoglobin.  Hyperglycemia with no acidosis. Radiology: ordered and independent interpretation performed.    Details: No CHF ECG/medicine tests: ordered and independent interpretation performed.    Details: No acute change from Feb 2025  Risk OTC drugs. Prescription drug management. Decision regarding hospitalization.   Patient presents with chest pain.  Has mostly been an exertional problem for months but today started with exertion and  did not go away with rest.  Concerned this may be unstable angina.  Was given aspirin  and nitro and  is feeling a lot better.  Pain is now gone.  Discussed with internal medicine teaching service who will admit.  He was given some IV fluids and subcutaneous insulin  for his hyperglycemia without acidosis.  I also discussed with cardiology, Dr. Albert Huff, will see in consultation.  Recommends echo but agrees with starting heparin  given concern for unstable angina based on history.  Patient does not have any history of bleeding issues.        Final Clinical Impression(s) / ED Diagnoses Final diagnoses:  Unstable angina (HCC)  Hyperglycemia    Rx / DC Orders ED Discharge Orders     None         Jerilynn Montenegro, MD 12/25/23 1444

## 2023-12-26 ENCOUNTER — Encounter (HOSPITAL_COMMUNITY): Payer: Self-pay | Admitting: Internal Medicine

## 2023-12-26 ENCOUNTER — Telehealth (HOSPITAL_COMMUNITY): Payer: Self-pay | Admitting: Pharmacy Technician

## 2023-12-26 ENCOUNTER — Encounter (HOSPITAL_COMMUNITY)
Admission: EM | Disposition: A | Discharge status: Home Health | Payer: Self-pay | Source: Home / Self Care | Attending: Thoracic Surgery (Cardiothoracic Vascular Surgery)

## 2023-12-26 ENCOUNTER — Observation Stay (HOSPITAL_BASED_OUTPATIENT_CLINIC_OR_DEPARTMENT_OTHER)

## 2023-12-26 ENCOUNTER — Observation Stay (HOSPITAL_COMMUNITY)

## 2023-12-26 ENCOUNTER — Other Ambulatory Visit (HOSPITAL_COMMUNITY): Payer: Self-pay

## 2023-12-26 DIAGNOSIS — Z01810 Encounter for preprocedural cardiovascular examination: Secondary | ICD-10-CM | POA: Diagnosis not present

## 2023-12-26 DIAGNOSIS — R079 Chest pain, unspecified: Secondary | ICD-10-CM | POA: Diagnosis not present

## 2023-12-26 DIAGNOSIS — I200 Unstable angina: Principal | ICD-10-CM

## 2023-12-26 DIAGNOSIS — E785 Hyperlipidemia, unspecified: Secondary | ICD-10-CM | POA: Diagnosis not present

## 2023-12-26 DIAGNOSIS — I10 Essential (primary) hypertension: Secondary | ICD-10-CM | POA: Diagnosis not present

## 2023-12-26 DIAGNOSIS — I25700 Atherosclerosis of coronary artery bypass graft(s), unspecified, with unstable angina pectoris: Secondary | ICD-10-CM

## 2023-12-26 DIAGNOSIS — I25110 Atherosclerotic heart disease of native coronary artery with unstable angina pectoris: Secondary | ICD-10-CM | POA: Diagnosis not present

## 2023-12-26 DIAGNOSIS — I2510 Atherosclerotic heart disease of native coronary artery without angina pectoris: Secondary | ICD-10-CM | POA: Diagnosis not present

## 2023-12-26 DIAGNOSIS — Z794 Long term (current) use of insulin: Secondary | ICD-10-CM

## 2023-12-26 DIAGNOSIS — E1165 Type 2 diabetes mellitus with hyperglycemia: Secondary | ICD-10-CM

## 2023-12-26 HISTORY — PX: CORONARY PRESSURE/FFR WITH 3D MAPPING: CATH118309

## 2023-12-26 HISTORY — PX: LEFT HEART CATH AND CORONARY ANGIOGRAPHY: CATH118249

## 2023-12-26 LAB — LIPID PANEL
Cholesterol: 277 mg/dL — ABNORMAL HIGH (ref 0–200)
HDL: 37 mg/dL — ABNORMAL LOW (ref >40)
LDL Cholesterol: UNDETERMINED mg/dL (ref 0–99)
Total CHOL/HDL Ratio: 7.5 ratio
Triglycerides: 403 mg/dL — ABNORMAL HIGH (ref <150)
VLDL: UNDETERMINED mg/dL (ref 0–40)

## 2023-12-26 LAB — CBC
HCT: 34.9 % — ABNORMAL LOW (ref 39.0–52.0)
Hemoglobin: 12.1 g/dL — ABNORMAL LOW (ref 13.0–17.0)
MCH: 29.8 pg (ref 26.0–34.0)
MCHC: 34.7 g/dL (ref 30.0–36.0)
MCV: 86 fL (ref 80.0–100.0)
Platelets: 146 10*3/uL — ABNORMAL LOW (ref 150–400)
RBC: 4.06 MIL/uL — ABNORMAL LOW (ref 4.22–5.81)
RDW: 12.8 % (ref 11.5–15.5)
WBC: 5.7 10*3/uL (ref 4.0–10.5)
nRBC: 0 % (ref 0.0–0.2)

## 2023-12-26 LAB — ECHOCARDIOGRAM COMPLETE
AR max vel: 1.94 cm2
AV Area VTI: 1.78 cm2
AV Area mean vel: 1.84 cm2
AV Mean grad: 6 mmHg
AV Peak grad: 11.2 mmHg
Ao pk vel: 1.67 m/s
Area-P 1/2: 3.12 cm2
Height: 71 in
MV M vel: 4.67 m/s
MV Peak grad: 87.2 mmHg
S' Lateral: 2.9 cm
Weight: 2784 [oz_av]

## 2023-12-26 LAB — URINALYSIS, ROUTINE W REFLEX MICROSCOPIC
Bacteria, UA: NONE SEEN
Bilirubin Urine: NEGATIVE
Glucose, UA: >=500 mg/dL — AB
Hgb urine dipstick: NEGATIVE
Ketones, ur: NEGATIVE mg/dL
Leukocytes,Ua: NEGATIVE
Nitrite: NEGATIVE
Protein, ur: NEGATIVE mg/dL
Specific Gravity, Urine: 1.013 (ref 1.005–1.030)
pH: 6 (ref 5.0–8.0)

## 2023-12-26 LAB — BLOOD GAS, ARTERIAL
Acid-Base Excess: 2.4 mmol/L — ABNORMAL HIGH (ref 0.0–2.0)
Bicarbonate: 26.4 mmol/L (ref 20.0–28.0)
O2 Saturation: 98.4 %
Patient temperature: 37
pCO2 arterial: 38 mmHg (ref 32–48)
pH, Arterial: 7.45 (ref 7.35–7.45)
pO2, Arterial: 81 mmHg — ABNORMAL LOW (ref 83–108)

## 2023-12-26 LAB — GLUCOSE, CAPILLARY
Glucose-Capillary: 403 mg/dL — ABNORMAL HIGH (ref 70–99)
Glucose-Capillary: 285 mg/dL — ABNORMAL HIGH (ref 70–99)
Glucose-Capillary: 291 mg/dL — ABNORMAL HIGH (ref 70–99)
Glucose-Capillary: 330 mg/dL — ABNORMAL HIGH (ref 70–99)

## 2023-12-26 LAB — ABO/RH: ABO/RH(D): O POS

## 2023-12-26 LAB — BASIC METABOLIC PANEL WITH GFR
Anion gap: 8 (ref 5–15)
BUN: 24 mg/dL — ABNORMAL HIGH (ref 8–23)
CO2: 24 mmol/L (ref 22–32)
Calcium: 8.7 mg/dL — ABNORMAL LOW (ref 8.9–10.3)
Chloride: 104 mmol/L (ref 98–111)
Creatinine, Ser: 0.99 mg/dL (ref 0.61–1.24)
GFR, Estimated: >60 mL/min (ref >60)
Glucose, Bld: 353 mg/dL — ABNORMAL HIGH (ref 70–99)
Potassium: 3.7 mmol/L (ref 3.5–5.1)
Sodium: 136 mmol/L (ref 135–145)

## 2023-12-26 LAB — VAS US DOPPLER PRE CABG

## 2023-12-26 LAB — LDL CHOLESTEROL, DIRECT: Direct LDL: 186 mg/dL — ABNORMAL HIGH (ref 0–99)

## 2023-12-26 LAB — SARS CORONAVIRUS 2 BY RT PCR: SARS Coronavirus 2 by RT PCR: NEGATIVE

## 2023-12-26 LAB — MAGNESIUM: Magnesium: 1.8 mg/dL (ref 1.7–2.4)

## 2023-12-26 LAB — HEPARIN LEVEL (UNFRACTIONATED)
Heparin Unfractionated: 0.3 [IU]/mL (ref 0.30–0.70)
Heparin Unfractionated: 0.32 [IU]/mL (ref 0.30–0.70)

## 2023-12-26 LAB — PREPARE RBC (CROSSMATCH)

## 2023-12-26 SURGERY — LEFT HEART CATH AND CORONARY ANGIOGRAPHY
Anesthesia: LOCAL

## 2023-12-26 MED ORDER — SODIUM CHLORIDE 0.9% FLUSH: 3.0000 mL | INTRAVENOUS | Status: DC | PRN

## 2023-12-26 MED ORDER — POTASSIUM CHLORIDE CRYS ER 20 MEQ PO TBCR
40.0000 meq | EXTENDED_RELEASE_TABLET | Freq: Once | ORAL | Status: AC
  Administered 2023-12-26: 40 meq via ORAL
  Filled 2023-12-26: qty 2

## 2023-12-26 MED ORDER — MAGNESIUM SULFATE 2 GM/50ML IV SOLN
2.0000 g | Freq: Once | INTRAVENOUS | Status: AC
  Administered 2023-12-26: 2 g via INTRAVENOUS
  Filled 2023-12-26: qty 50

## 2023-12-26 MED ORDER — INSULIN GLARGINE-YFGN 100 UNIT/ML ~~LOC~~ SOLN
15.0000 [IU] | Freq: Once | SUBCUTANEOUS | Status: AC
  Administered 2023-12-26: 15 [IU] via SUBCUTANEOUS
  Filled 2023-12-26: qty 0.15

## 2023-12-26 MED ORDER — SODIUM CHLORIDE 0.9 % IV SOLN: 250.0000 mL | INTRAVENOUS | Status: DC | PRN

## 2023-12-26 MED ORDER — IOHEXOL 350 MG/ML SOLN
INTRAVENOUS | Status: DC | PRN
Start: 2023-12-26 — End: 2023-12-26
  Administered 2023-12-26: 25 mL via INTRA_ARTERIAL

## 2023-12-26 MED ORDER — ACETAMINOPHEN 325 MG PO TABS: 650.0000 mg | ORAL_TABLET | ORAL | Status: DC | PRN

## 2023-12-26 MED ORDER — ASPIRIN 81 MG PO CHEW: 81.0000 mg | CHEWABLE_TABLET | ORAL | Status: DC

## 2023-12-26 MED ORDER — HEPARIN (PORCINE) IN NACL 1000-0.9 UT/500ML-% IV SOLN
INTRAVENOUS | Status: DC | PRN
  Administered 2023-12-26: 1000 mL via SURGICAL_CAVITY

## 2023-12-26 MED ORDER — NOREPINEPHRINE 4 MG/250ML-% IV SOLN
0.0000 ug/min | INTRAVENOUS | Status: DC
  Filled 2023-12-26: qty 250

## 2023-12-26 MED ORDER — BISACODYL 5 MG PO TBEC: 5.0000 mg | DELAYED_RELEASE_TABLET | Freq: Once | ORAL | Status: DC

## 2023-12-26 MED ORDER — METOPROLOL TARTRATE 12.5 MG HALF TABLET
12.5000 mg | ORAL_TABLET | Freq: Once | ORAL | Status: AC
  Administered 2023-12-27: 12.5 mg via ORAL
  Filled 2023-12-26: qty 1

## 2023-12-26 MED ORDER — FENTANYL CITRATE (PF) 100 MCG/2ML IJ SOLN
INTRAMUSCULAR | Status: AC
  Filled 2023-12-26: qty 2

## 2023-12-26 MED ORDER — HYDRALAZINE HCL 20 MG/ML IJ SOLN: 10.0000 mg | INTRAMUSCULAR | Status: AC | PRN

## 2023-12-26 MED ORDER — HEPARIN SODIUM (PORCINE) 1000 UNIT/ML IJ SOLN
INTRAMUSCULAR | Status: DC | PRN
  Administered 2023-12-26: 5000 [IU] via INTRA_ARTERIAL

## 2023-12-26 MED ORDER — INSULIN REGULAR(HUMAN) IN NACL 100-0.9 UT/100ML-% IV SOLN
INTRAVENOUS | Status: AC
  Administered 2023-12-27: 7 [IU]/h via INTRAVENOUS
  Filled 2023-12-26: qty 100

## 2023-12-26 MED ORDER — ENSURE PLUS HIGH PROTEIN PO LIQD
237.0000 mL | Freq: Two times a day (BID) | ORAL | Status: DC
  Administered 2023-12-26 – 2024-01-03 (×12): 237 mL via ORAL

## 2023-12-26 MED ORDER — NITROGLYCERIN IN D5W 200-5 MCG/ML-% IV SOLN
2.0000 ug/min | INTRAVENOUS | Status: DC
  Filled 2023-12-26: qty 250

## 2023-12-26 MED ORDER — FENTANYL CITRATE (PF) 100 MCG/2ML IJ SOLN
INTRAMUSCULAR | Status: DC | PRN
  Administered 2023-12-26: 25 ug via INTRAVENOUS

## 2023-12-26 MED ORDER — CEFAZOLIN SODIUM-DEXTROSE 2-4 GM/100ML-% IV SOLN
2.0000 g | INTRAVENOUS | Status: AC
  Administered 2023-12-27 (×2): 2 g via INTRAVENOUS
  Filled 2023-12-26: qty 100

## 2023-12-26 MED ORDER — LOSARTAN POTASSIUM 50 MG PO TABS: 50.0000 mg | ORAL_TABLET | Freq: Every day | ORAL | Status: DC

## 2023-12-26 MED ORDER — VERAPAMIL HCL 2.5 MG/ML IV SOLN
INTRAVENOUS | Status: DC | PRN
  Administered 2023-12-26: 10 mL via INTRA_ARTERIAL

## 2023-12-26 MED ORDER — VERAPAMIL HCL 2.5 MG/ML IV SOLN
INTRAVENOUS | Status: AC
  Filled 2023-12-26: qty 2

## 2023-12-26 MED ORDER — INSULIN GLARGINE-YFGN 100 UNIT/ML ~~LOC~~ SOLN
15.0000 [IU] | Freq: Every day | SUBCUTANEOUS | Status: DC
  Administered 2023-12-26: 15 [IU] via SUBCUTANEOUS
  Filled 2023-12-26 (×2): qty 0.15

## 2023-12-26 MED ORDER — EPINEPHRINE HCL 5 MG/250ML IV SOLN IN NS
0.0000 ug/min | INTRAVENOUS | Status: DC
  Filled 2023-12-26: qty 250

## 2023-12-26 MED ORDER — ASPIRIN 81 MG PO CHEW
81.0000 mg | CHEWABLE_TABLET | ORAL | Status: AC
  Administered 2023-12-26: 81 mg via ORAL
  Filled 2023-12-26: qty 1

## 2023-12-26 MED ORDER — VANCOMYCIN HCL 1250 MG/250ML IV SOLN
1250.0000 mg | INTRAVENOUS | Status: AC
  Administered 2023-12-27: 1250 mg via INTRAVENOUS
  Filled 2023-12-26: qty 250

## 2023-12-26 MED ORDER — CEFAZOLIN SODIUM-DEXTROSE 2-4 GM/100ML-% IV SOLN
2.0000 g | INTRAVENOUS | Status: DC
  Filled 2023-12-26: qty 100

## 2023-12-26 MED ORDER — MIDAZOLAM HCL 2 MG/2ML IJ SOLN
INTRAMUSCULAR | Status: AC
  Filled 2023-12-26: qty 2

## 2023-12-26 MED ORDER — CHLORHEXIDINE GLUCONATE CLOTH 2 % EX PADS: 6.0000 | MEDICATED_PAD | Freq: Once | CUTANEOUS | Status: DC

## 2023-12-26 MED ORDER — HEPARIN SODIUM (PORCINE) 1000 UNIT/ML IJ SOLN
INTRAMUSCULAR | Status: AC
  Filled 2023-12-26: qty 10

## 2023-12-26 MED ORDER — CHLORHEXIDINE GLUCONATE CLOTH 2 % EX PADS
6.0000 | MEDICATED_PAD | Freq: Once | CUTANEOUS | Status: AC
  Administered 2023-12-26: 6 via TOPICAL

## 2023-12-26 MED ORDER — PHENYLEPHRINE HCL-NACL 20-0.9 MG/250ML-% IV SOLN
30.0000 ug/min | INTRAVENOUS | Status: AC
  Administered 2023-12-27: 35 ug/min via INTRAVENOUS
  Filled 2023-12-26: qty 250

## 2023-12-26 MED ORDER — DEXMEDETOMIDINE HCL IN NACL 400 MCG/100ML IV SOLN
0.1000 ug/kg/h | INTRAVENOUS | Status: AC
  Administered 2023-12-27: .5 ug/kg/h via INTRAVENOUS
  Filled 2023-12-26: qty 100

## 2023-12-26 MED ORDER — LIDOCAINE HCL (PF) 1 % IJ SOLN
INTRAMUSCULAR | Status: AC
  Filled 2023-12-26: qty 30

## 2023-12-26 MED ORDER — TRANEXAMIC ACID 1000 MG/10ML IV SOLN
1.5000 mg/kg/h | INTRAVENOUS | Status: AC
  Administered 2023-12-27: 1.5 mg/kg/h via INTRAVENOUS
  Filled 2023-12-26: qty 25

## 2023-12-26 MED ORDER — TRANEXAMIC ACID (OHS) PUMP PRIME SOLUTION
2.0000 mg/kg | INTRAVENOUS | Status: DC
  Filled 2023-12-26: qty 1.58

## 2023-12-26 MED ORDER — LIDOCAINE HCL (PF) 1 % IJ SOLN
INTRAMUSCULAR | Status: DC | PRN
  Administered 2023-12-26: 2 mL

## 2023-12-26 MED ORDER — MANNITOL 20 % IV SOLN
INTRAVENOUS | Status: DC
  Filled 2023-12-26: qty 13

## 2023-12-26 MED ORDER — CHLORHEXIDINE GLUCONATE 0.12 % MT SOLN
15.0000 mL | Freq: Once | OROMUCOSAL | Status: AC
  Administered 2023-12-27: 15 mL via OROMUCOSAL
  Filled 2023-12-26: qty 15

## 2023-12-26 MED ORDER — MIDAZOLAM HCL 2 MG/2ML IJ SOLN
INTRAMUSCULAR | Status: DC | PRN
  Administered 2023-12-26: 1 mg via INTRAVENOUS

## 2023-12-26 MED ORDER — POTASSIUM CHLORIDE 2 MEQ/ML IV SOLN
80.0000 meq | INTRAVENOUS | Status: DC
  Filled 2023-12-26: qty 40

## 2023-12-26 MED ORDER — HEPARIN 30,000 UNITS/1000 ML (OHS) CELLSAVER SOLUTION
Status: DC
  Filled 2023-12-26: qty 1000

## 2023-12-26 MED ORDER — LOSARTAN POTASSIUM 25 MG PO TABS
25.0000 mg | ORAL_TABLET | Freq: Every day | ORAL | Status: DC
  Administered 2023-12-26: 25 mg via ORAL
  Filled 2023-12-26: qty 1

## 2023-12-26 MED ORDER — SODIUM CHLORIDE 0.9 % WEIGHT BASED INFUSION
3.0000 mL/kg/h | INTRAVENOUS | Status: DC
  Administered 2023-12-26: 3 mL/kg/h via INTRAVENOUS

## 2023-12-26 MED ORDER — CHLORHEXIDINE GLUCONATE CLOTH 2 % EX PADS
6.0000 | MEDICATED_PAD | Freq: Once | CUTANEOUS | Status: AC
  Administered 2023-12-27: 6 via TOPICAL

## 2023-12-26 MED ORDER — HEPARIN (PORCINE) 25000 UT/250ML-% IV SOLN
1200.0000 [IU]/h | INTRAVENOUS | Status: DC
  Administered 2023-12-26: 1200 [IU]/h via INTRAVENOUS
  Filled 2023-12-26: qty 250

## 2023-12-26 MED ORDER — FENOFIBRATE 160 MG PO TABS
160.0000 mg | ORAL_TABLET | Freq: Every day | ORAL | Status: DC
  Administered 2023-12-26 – 2024-01-03 (×8): 160 mg via ORAL
  Filled 2023-12-26 (×10): qty 1

## 2023-12-26 MED ORDER — TEMAZEPAM 15 MG PO CAPS
15.0000 mg | ORAL_CAPSULE | Freq: Once | ORAL | Status: AC | PRN
  Administered 2023-12-26: 15 mg via ORAL
  Filled 2023-12-26: qty 1

## 2023-12-26 MED ORDER — SODIUM CHLORIDE 0.9 % WEIGHT BASED INFUSION
1.0000 mL/kg/h | INTRAVENOUS | Status: DC
  Administered 2023-12-26: 1 mL/kg/h via INTRAVENOUS

## 2023-12-26 MED ORDER — PLASMA-LYTE A IV SOLN
INTRAVENOUS | Status: DC
  Filled 2023-12-26: qty 2.5

## 2023-12-26 MED ORDER — TRANEXAMIC ACID (OHS) BOLUS VIA INFUSION
15.0000 mg/kg | INTRAVENOUS | Status: AC
  Administered 2023-12-27: 1183.5 mg via INTRAVENOUS
  Filled 2023-12-26: qty 1184

## 2023-12-26 MED ORDER — MILRINONE LACTATE IN DEXTROSE 20-5 MG/100ML-% IV SOLN
0.3000 ug/kg/min | INTRAVENOUS | Status: DC
  Filled 2023-12-26: qty 100

## 2023-12-26 MED ORDER — AMLODIPINE BESYLATE 10 MG PO TABS
10.0000 mg | ORAL_TABLET | Freq: Every day | ORAL | Status: DC
  Administered 2023-12-26: 10 mg via ORAL
  Filled 2023-12-26: qty 1

## 2023-12-26 MED ORDER — ONDANSETRON HCL 4 MG/2ML IJ SOLN: 4.0000 mg | Freq: Four times a day (QID) | INTRAMUSCULAR | Status: DC | PRN

## 2023-12-26 MED ORDER — LABETALOL HCL 5 MG/ML IV SOLN: 10.0000 mg | INTRAVENOUS | Status: AC | PRN

## 2023-12-26 MED ORDER — SODIUM CHLORIDE 0.9% FLUSH
3.0000 mL | Freq: Two times a day (BID) | INTRAVENOUS | Status: DC
  Administered 2023-12-26 (×2): 3 mL via INTRAVENOUS

## 2023-12-26 SURGICAL SUPPLY — 12 items
CARD KEY FFR CATHWORX (MISCELLANEOUS) IMPLANT
CATH INFINITI 5FR ANG PIGTAIL (CATHETERS) IMPLANT
CATH INFINITI AMBI 6FR TG (CATHETERS) IMPLANT
DEVICE RAD COMP TR BAND LRG (VASCULAR PRODUCTS) IMPLANT
GLIDESHEATH SLEND SS 6F .021 (SHEATH) IMPLANT
KIT SINGLE USE MANIFOLD (KITS) IMPLANT
KIT SYRINGE INJ CVI SPIKEX1 (MISCELLANEOUS) IMPLANT
PACK CARDIAC CATHETERIZATION (CUSTOM PROCEDURE TRAY) ×2 IMPLANT
SET ATX-X65L (MISCELLANEOUS) IMPLANT
SHEATH PROBE COVER 6X72 (BAG) IMPLANT
WIRE EMERALD 3MM-J .035X260CM (WIRE) IMPLANT
WIRE HI TORQ VERSACORE-J 145CM (WIRE) IMPLANT

## 2023-12-26 NOTE — TOC Initial Note (Signed)
 Transition of Care [facility]) - Initial/Assessment Note    Patient Details  Name: Scott Donaldson MRN: [id number] Date of Birth: 1951-07-21  Transition of [facility]) CM/SW Contact:    Cosimo Diones, RN Phone Number: 12/26/2023, 4:11 PM  Clinical Narrative: Patient presented for chest pain. PTA patient was from home with son and daughter. Case Manager spoke with daughter and patient has DME cane in the home. Patient is without PCP-Family Medicine unable to accept the patient. CMA to schedule a hospital follow up for new PCP office in [place]. Diabetes Coordinator has been working with patient regarding medication cost. Benefits check completed for Lantus , Novolog  and Humalog . Cost between $25.00-$35.00. Case Manager will continue to follow for additional transition of care needs.                  Expected Discharge Plan: Home/Self Care Barriers to Discharge: Continued Medical Work up   Patient Goals and CMS Choice Patient states their goals for this hospitalization and ongoing recovery are:: plan to return home with family support   Choice offered to / list presented to : NA      Expected Discharge Plan and Services In-house Referral: NA Discharge Planning Services: CM Consult, Follow-up appt scheduled, Medication Assistance Post Acute Care Choice: NA Living arrangements for the past 2 months: Single Family Home                   DME Agency: NA       HH Arranged: NA          Prior Living Arrangements/Services Living arrangements for the past 2 months: Single Family Home Lives with:: Adult Children Patient language and need for interpreter reviewed:: Yes Do you feel safe going back to the place where you live?: Yes      Need for Family Participation in Patient Care: Yes (Comment) Care giver support system in place?: Yes (comment) Current home services: DME (cane that the patient does not use.) Criminal Activity/Legal Involvement Pertinent to Current  Situation/Hospitalization: No - Comment as needed  Activities of Daily Living   ADL Screening (condition at time of admission) Independently performs ADLs?: Yes (appropriate for developmental age) Is the patient deaf or have difficulty hearing?: Yes Does the patient have difficulty seeing, even when wearing glasses/contacts?: Yes Does the patient have difficulty concentrating, remembering, or making decisions?: No  Permission Sought/Granted Permission sought to share information with : Family Supports, Case Manager                Emotional Assessment Appearance:: Appears stated age Attitude/Demeanor/Rapport: Unable to Assess Affect (typically observed): Unable to Assess   Alcohol / Substance Use: Not Applicable Psych Involvement: No (comment)  Admission diagnosis:  Unstable angina (HCC) [I20.0] Hyperglycemia [R73.9] Chest pain [R07.9] Patient Active Problem List   Diagnosis Date Noted   Unstable angina (HCC) 12/26/2023   Light-headedness 10/22/2021   Chronic diastolic CHF (congestive heart failure) (HCC) 10/22/2021   NSTEMI (non-ST elevated myocardial infarction) (HCC) 10/21/2021   Right upper quadrant abdominal pain 07/29/2021   Pre-op evaluation 02/09/2021   Elevated uric acid in blood 10/22/2020   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   Iron  deficiency 04/18/2020   B12 deficiency 04/18/2020   Anemia 04/17/2020   Urinary retention 04/17/2020   Balanitis 04/04/2020   Hospital discharge follow-up 04/04/2020   Prostatitis 03/28/2020   Hypotension due to hypovolemia 03/27/2020   Hyperkalemia 03/27/2020   Acute kidney injury (HCC) 03/27/2020   Right knee  pain 03/07/2020   Acute idiopathic gout of right knee 02/25/2020   Bilateral carotid bruits 12/05/2019   Type 2 diabetes mellitus with diabetic polyneuropathy, with long-term current use of insulin  (HCC) 09/05/2019   Diabetes mellitus (HCC) 09/05/2019   Cervical radiculopathy 02/12/2019    Constipation 02/12/2019   DOE (dyspnea on exertion) 02/12/2019   Chest pain 02/12/2019   No-show for appointment 11/21/2018   Coronary artery disease involving native heart 07/20/2018   Bronchitis 07/20/2018   Abnormal stress test    Elevated LDL cholesterol level 06/14/2018   Swelling of left foot 08/05/2017   Nonintractable episodic headache 12/01/2016   Cough 08/04/2016   Right lower quadrant abdominal pain 08/04/2016   Abnormal PSA 05/19/2015   OA (osteoarthritis) of knee 05/16/2015   Foot ulcer, right (HCC) 04/15/2014   Loss of vision 04/15/2014   Screening for prostate cancer 11/03/2012   Routine general medical examination at a health care facility 11/03/2012   Arthralgia 11/12/2010   SHOULDER PAIN, BILATERAL 01/12/2010   TUBULOVILLOUS ADENOMA, 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   HYPERCHOLESTEROLEMIA 07/29/2008   CELLULITIS, FOOT, RIGHT 07/02/2008   EDEMA 07/02/2008   Type 2 diabetes mellitus with hyperglycemia, with long-term current use of insulin  (HCC) 03/04/2007   Essential hypertension 03/04/2007   PCP:  Patient, No Pcp Per Pharmacy:   [id number] Jonette Nestle, [place] - [address] AT [facility] OF [facility] & [address] [place] [address] Phone: [phone] Fax: [phone]     Social Drivers of Health (SDOH) Social History: SDOH Screenings   Food Insecurity: Food Insecurity Present (12/25/2023)  Housing: Low Risk  (12/25/2023)  Transportation Needs: No Transportation Needs (12/25/2023)  Utilities: Not At Risk (12/25/2023)  Alcohol Screen: Low Risk  (11/13/2021)  Depression (PHQ2-9): Low Risk  (01/06/2022)  Financial Resource Strain: Low Risk  (11/13/2021)  Physical Activity: Insufficiently Active (11/13/2021)  Social Connections: Moderately Isolated (12/25/2023)  Stress: No Stress Concern Present (11/13/2021)  Tobacco Use: Medium Risk (12/25/2023)   SDOH Interventions: Food Insecurity Interventions: [employer] Provided,  Inpatient TOC   Readmission Risk Interventions     No data to display

## 2023-12-26 NOTE — Progress Notes (Signed)
*  PRELIMINARY RESULTS* Echocardiogram 2D Echocardiogram has been performed.  Scott Donaldson 12/26/2023, 2:44 PM

## 2023-12-26 NOTE — Progress Notes (Addendum)
 PHARMACY - ANTICOAGULATION Pharmacy Consult for heparin  Indication: chest pain/ACS Brief A/P: Heparin  level within goal range Increase Heparin  rate to keep in goal  No Known Allergies  Patient Measurements: Height: 5\' 11"  (180.3 cm) Weight: 78.9 kg (174 lb) IBW/kg (Calculated) : 75.3 HEPARIN  DW (KG): 78.9  Vital Signs: Temp: 98 F (36.7 C) (06/01 1830) Temp Source: Oral (06/01 1830) BP: 180/76 (06/01 1830) Pulse Rate: 64 (06/01 1830)  Labs: Recent Labs    12/25/23 1155 12/25/23 1245 12/25/23 2334  HGB 13.1  --   --   HCT 37.2*  --   --   PLT 167  --   --   HEPARINUNFRC  --   --  0.32  CREATININE 1.04  --   --   TROPONINIHS 45* 62*  --     Estimated Creatinine Clearance: 67.4 mL/min (by C-G formula based on SCr of 1.04 mg/dL).  Assessment: 73 y.o. male with chest pain for heparin    Goal of Therapy:  Heparin  level 0.3-0.7 units/ml Monitor platelets by anticoagulation protocol: Yes   Plan:  Increase Heparin  1150 units/hr to keep with goal range Follow-up am labs.  Claudine Cullens, PharmD, BCPS  12/26/2023 12:24 AM

## 2023-12-26 NOTE — Progress Notes (Signed)
 PHARMACY - ANTICOAGULATION CONSULT NOTE  Pharmacy Consult for heparin  Indication: chest pain/ACS  No Known Allergies  Patient Measurements: Height: 5\' 11"  (180.3 cm) Weight: 78.9 kg (174 lb) IBW/kg (Calculated) : 75.3 HEPARIN  DW (KG): 78.9  Vital Signs: Temp: 98.3 F (36.8 C) (06/02 0405) Temp Source: Oral (06/02 0405) BP: 154/62 (06/02 0405) Pulse Rate: 55 (06/02 0405)  Labs: Recent Labs    12/25/23 1155 12/25/23 1245 12/25/23 2334 12/26/23 0446  HGB 13.1  --   --  12.1*  HCT 37.2*  --   --  34.9*  PLT 167  --   --  146*  HEPARINUNFRC  --   --  0.32 0.30  CREATININE 1.04  --   --  0.99  TROPONINIHS 45* 62*  --   --     Estimated Creatinine Clearance: 70.8 mL/min (by C-G formula based on SCr of 0.99 mg/dL).   Medical History: Past Medical History:  Diagnosis Date   Abdominal pain 08/04/2016   Abnormal PSA 05/19/2015   Anemia    Arthralgia 11/12/2010   Benign prostatic hyperplasia with urinary hesitancy 06/14/2018   Cataract    Coronary artery disease    nonobstructive   Diabetes (HCC) 03/04/2007   Qualifier: Diagnosis of  By: [place] Hacker MD, Sean A    DIABETES MELLITUS, TYPE II 03/04/2007   Elevated LDL cholesterol level 06/14/2018   Foot ulcer, right (HCC) 04/15/2014   GERD (gastroesophageal reflux disease)    HOH (hard of hearing)    both ears no hearing aides   HYPERCHOLESTEROLEMIA 07/29/2008   HYPERTENSION 03/04/2007   Knee pain, left 05/16/2015   Nonintractable episodic headache 12/01/2016   migraine   SHOULDER PAIN, BILATERAL 01/12/2010   Qualifier: Diagnosis of  By: [place] Hacker MD, Sean A    TUBULOVILLOUS ADENOMA, 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   6/2 AM: Heparin  level remains therapeutic at 0.30 with heparin  running at 1150 units/hour- checked ~5 hours after rate adjustment. Will increase rate slightly in order to stay in  therapeutic range. No signs of bleeding or issues with the heparin  infusion noted.   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    Goal of Therapy:  Heparin  level 0.3-0.7 units/ml Monitor platelets by anticoagulation protocol: Yes   Plan:  Restart Heparin  gtt at 1200 units/hour- start gtt at 1400 Daily HL  F/u cards eval and recs  Chrystie Crass, PharmD Clinical Pharmacist  12/26/2023 7:06 AM

## 2023-12-26 NOTE — Progress Notes (Signed)
 VASCULAR LAB    Pre CABG Dopplers have been performed.  See CV proc for preliminary results.   Mirka Barbone, RVT 12/26/2023, 1:43 PM

## 2023-12-26 NOTE — Inpatient Diabetes Management (Signed)
 Inpatient Diabetes Program Recommendations  AACE/ADA: New Consensus Statement on Inpatient Glycemic Control (2015)  Target Ranges:  Prepandial:   less than 140 mg/dL      Peak postprandial:   less than 180 mg/dL (1-2 hours)      Critically ill patients:  140 - 180 mg/dL    Latest Reference Range & Units 12/25/23 11:55  Glucose 70 - 99 mg/dL 865 (HH)  (HH): Data is critically high  Latest Reference Range & Units 12/25/23 11:56  Hemoglobin A1C 4.8 - 5.6 % 13.3 (H)  335 mg/dl  (H): Data is abnormally high  Latest Reference Range & Units 12/25/23 16:39 12/25/23 21:28 12/26/23 08:25  Glucose-Capillary 70 - 99 mg/dL  15 units Novolog  @1337  264 (H)  8 units Novolog   284 (H) 403 (H)  (H): Data is abnormally high  Admit with: CP Pt reports no longer taking medications for 7-8 months due to finances.  He was recently dismissed from his primary care provider for not going to his appointments.  His son and daughter are bedside and they note that he is stubborn and does not allow them to help him, especially financially, with his medical care.  He started taking cheap 70/30 insulin  from [employer], 20 to 25 units daily, which he has been doing for the past few months or so.   History: DM2  Home DM Meds: Lantus  30 units at HS       Lyumjev  Insulin  8-12 units TID with meals       Metformin  500 mg daily  Current Orders: Novolog  Moderate Correction Scale/ SSI (0-15 units) TID AC   MD- Note CBG 403 this AM  Please consider adding weight based basal insulin :  Semglee  15 units daily (0.2 units/kg)    --Will follow patient during hospitalization--  Langston Pippins RN, MSN, CDCES Diabetes Coordinator Inpatient Glycemic Control Team Team Pager: [phone] (8a-5p)

## 2023-12-26 NOTE — Telephone Encounter (Signed)
 Patient Product/process development scientist completed.    The patient is insured through [employer]. Patient has Medicare and is not eligible for a copay card, but may be able to apply for patient assistance or Medicare RX Payment Plan (Patient Must reach out to their plan, if eligible for payment plan), if available.    Ran test claim for Lantus  pen and the current 30 day co-pay is $25.00.  Ran test claim for Humalog  [place] and the current 30 day co-pay is $25.00.   This test claim was processed through [facility]- copay amounts may vary at other pharmacies due to pharmacy/plan contracts, or as the patient moves through the different stages of their insurance plan.     Morgan Arab, CPHT Pharmacy Technician III Certified Patient Advocate [facility] Patient Advocate Team Direct Number: [phone]  Fax: [phone]

## 2023-12-26 NOTE — Progress Notes (Signed)
  Progress Note  Patient Name: Scott Donaldson Date of Encounter: 12/26/2023 [facility] Cardiologist: Janelle Mediate, MD   Interval Summary   Patient seen s/p LHC today. Found with complex in-stent restenosis of ostial left circumflex lesion extending into the left main. Also with hemodynamically significant RCA disease by FFR.   Vital Signs Vitals:   12/26/23 0803 12/26/23 0805 12/26/23 0810 12/26/23 0824  BP: (!) 145/69 (!) 156/72  (!) 161/78  Pulse: (!) 48 (!) 57 (!) 0 (!) 59  Resp: 18 16  16   Temp:    97.8 F (36.6 C)  TempSrc:    Oral  SpO2: 95% 97%  99%  Weight:      Height:       No intake or output data in the 24 hours ending 12/26/23 0906    12/25/2023   11:53 AM 09/15/2023    9:03 PM 10/08/2022    3:04 PM  Last 3 Weights  Weight (lbs) 174 lb 177 lb 0.5 oz 177 lb  Weight (kg) 78.926 kg 80.3 kg 80.287 kg      Telemetry/ECG  Sinus rhythm with frequent PACs and PVCs. - Personally Reviewed  Physical Exam  GEN: No acute distress.   Neck: No JVD Cardiac: RRR, no murmurs, rubs, or gallops.  Respiratory: Clear to auscultation bilaterally. GI: Soft, nontender, non-distended  MS: No edema  Assessment & Plan  Scott Donaldson is a 73 y.o. male with a hx of CAD status post DES to LCx, hypertension, hyperlipidemia, BPH status post TURP, diabetes.  Unstable Angina CAD Patient with central chest pain radiating to shoulders prompting ED evaluation. HS troponin 45->62. Patient started on heparin  and planned for LHC. LHC today found complex in-stent restenosis of ostial left circumflex lesion extending into the left main. Also with hemodynamically significant RCA disease by FFR. CT surgery consult pending. Continue Heparin . Continue ASA 81mg  Continue Atorvastatin  80mg  Echocardiogram is pending  Hypertension Patient hypertensive this admission, had been without medications for 7-8 months. Losartan  25mg  started. Continue Losartan  25mg  today, will start Amlodipine   10mg  in anticipation that ARB will be held for surgery  Hyperlipidemia Continue high intensity statin resumed this admission. Start Fenofibrate.  Lab Results  Component Value Date   CHOL 277 (H) 12/26/2023   HDL 37 (L) 12/26/2023   LDLCALC UNABLE TO CALCULATE IF TRIGLYCERIDE OVER 400 mg/dL 16/04/9603   LDLDIRECT 186 (H) 12/26/2023   TRIG 403 (H) 12/26/2023   CHOLHDL 7.5 12/26/2023   DM type II Patient with limited access to medications over the last few months, A1c 13.3. Management per primary.     For questions or updates, please contact [facility] Please consult [email or web address] for contact info under       Signed, Leala Prince, PA-C

## 2023-12-26 NOTE — Hospital Course (Addendum)
 History of Present Illness:  Scott Donaldson is a 73 yo male with a past medical history of coronary artery disease, NSTEMI 42' (s/p PCI with DES to left Circumflex), hypertension, hyperlipidemia, DM, BPH (s/p TURP), remote tobacco abuse (quit 1990), peripheral neuropathy who presented to the ED with complaints of chest pain that radiated to his neck and left arm. He also had complaints of shortness of breath, lightheadedness and nausea. EKG showed sinus rhythm, LVH, cannot rule out anterior infarct-age undetermined). Initial Troponin I was 45 and had a max of 62. Cardiac catheterization done 06/02 showed mid left main to distal left main 99% stenosis, proximal LAD with a 70% stenosis, ostial to proximal Circumflex with a 80% stenosis, and a 55%distal RCA stenosis.  It was felt the patient would benefit from coronary bypass grafting and Cardiothoracic surgery consultation was requested.  Hospital Course:   The patient was evaluated by Dr. Deloise Ferries who felt patient would benefit from coronary bypass grafting.  The risks and benefits of the procedure were explained to the patient and he was agreeable to proceed.  The patient remained chest pain free prior to surgery.  He was taken to the operating room and underwent a CABG x 3. He was transported from the OR to [facility] ICU in stable condition. He was extubated routinely.  Vital signs and hemodynamics remained stable.  He was mobilized in the ICU.  He was ready for transfer to [facility] by postop day 2.  Diet and activity were advanced and well-tolerated.  He had return of appropriate bowel function.  He remained in a stable sinus rhythm.

## 2023-12-26 NOTE — Plan of Care (Signed)
   Problem: Education: Goal: Knowledge of General Education information will improve Description Including pain rating scale, medication(s)/side effects and non-pharmacologic comfort measures Outcome: Progressing   Problem: Health Behavior/Discharge Planning: Goal: Ability to manage health-related needs will improve Outcome: Progressing

## 2023-12-26 NOTE — Consult Note (Addendum)
 [address]       [place] [address]             [phone]        TACOMA MERIDA Samak Medical Record #[id number] Date of Birth: 10-01-1950  Referring: Dr. Amalia Badder, MD Primary Care: Patient, No Pcp Per Primary Cardiologist:Peter Stann Earnest, MD  Chief Complaint:    Chief Complaint  Patient presents with   Chest Pain  Reason for consultation: Coronary artery disease  History of Present Illness:     This is a 73 year old with a past medical history of coronary artery disease, NSTEMI 44' (s/p PCI with DES to left Circumflex), hypertension, hyperlipidemia, DM, BPH (s/p TURP), remote tobacco abuse (quit 1990), peripheral neuropathy who presented to the ED with complaints of chest pain that radiated to his neck and left arm. He also had complaints of shortness of breath, lightheadedness and nausea. EKG showed sinus rhythm, LVH, cannot rule out anterior infarct-age undetermined). Initial Troponin I was 45 and had a max of 62. Cardiac catheterization done 06/02 showed mid left main to distal left main 99% stenosis, proximal LAD with a 70% stenosis, ostial to proximal Circumflex with a 80% stenosis, and a 55%distal RCA stenosis. Echo cardiogram has been done but results not available yet. Cardiothoracic surgery consultation was requested for the consideration of coronary artery bypass grafting surgery. At the time of my exam, the patient denied chest pain or shortness of breath. He had multiple family members in his room at the time of my exam.  He is semi retired (works as a Designer, fashion/clothing) and he lives with his daughter. He reported to medical doctor in the ED that he was not taking medications for the last 6-7 months due to issues with finances  Current Activity/ Functional Status: Patient is independent with mobility/ambulation, transfers, ADL's, IADL's.   Zubrod Score: At the time of surgery this patient's most appropriate activity status/level should be described  as: []     0    Normal activity, no symptoms [x]     1    Restricted in physical strenuous activity but ambulatory, able to do out light work []     2    Ambulatory and capable of self care, unable to do work activities, up and about more than 50% of the time                            []     3    Only limited self care, in bed greater than 50% of waking hours []     4    Completely disabled, no self care, confined to bed or chair []     5    Moribund  Past Medical History:  Diagnosis Date   Abdominal pain 08/04/2016   Abnormal PSA 05/19/2015   Anemia    Arthralgia 11/12/2010   Benign prostatic hyperplasia with urinary hesitancy 06/14/2018   Cataract    Coronary artery disease    nonobstructive   Diabetes (HCC) 03/04/2007   Qualifier: Diagnosis of  By: Washington Hacker MD, Sean A    DIABETES MELLITUS, TYPE II 03/04/2007   Elevated LDL cholesterol level 06/14/2018   Foot ulcer, right (HCC) 04/15/2014   GERD (gastroesophageal reflux disease)    HOH (hard of hearing)    both ears no hearing aides   HYPERCHOLESTEROLEMIA 07/29/2008   HYPERTENSION 03/04/2007   Knee pain, left 05/16/2015  Nonintractable episodic headache 12/01/2016   migraine   SHOULDER PAIN, BILATERAL 01/12/2010   Qualifier: Diagnosis of  By: Washington Hacker MD, Sean A    TUBULOVILLOUS ADENOMA, 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   Qualifier: Diagnosis of  By: Washington Hacker MD, Leota Randy     Past Surgical History:  Procedure Laterality Date   CARDIAC CATHETERIZATION     COLONOSCOPY  07/2017   CORONARY STENT INTERVENTION N/A 10/22/2021   Procedure: CORONARY STENT INTERVENTION;  Surgeon: Kyra Phy, MD;  Location: [facility];  Service: Cardiovascular;  Laterality: N/A;   LEFT HEART CATH AND CORONARY ANGIOGRAPHY N/A 07/12/2018   Procedure: LEFT HEART CATH AND CORONARY ANGIOGRAPHY;  Surgeon: Wenona Hamilton, MD;  Location: [facility];  Service: Cardiovascular;  Laterality: N/A;   LEFT HEART CATH AND CORONARY ANGIOGRAPHY N/A 10/22/2021   Procedure: LEFT  HEART CATH AND CORONARY ANGIOGRAPHY;  Surgeon: Kyra Phy, MD;  Location: [facility];  Service: Cardiovascular;  Laterality: N/A;   stress cardiolite  05/30/2003   TRANSURETHRAL RESECTION OF PROSTATE N/A 06/04/2020   Procedure: TRANSURETHRAL RESECTION OF THE PROSTATE (TURP), BIPOLAR;  Surgeon: Adelbert Homans, MD;  Location: [facility];  Service: Urology;  Laterality: N/A;    Social History   Tobacco Use  Smoking Status Former   Current packs/day: 0.00   Average packs/day: 1.5 packs/day for 25.0 years (37.5 ttl pk-yrs)   Types: Cigarettes   Start date: 07/27/1963   Quit date: 07/26/1988   Years since quitting: 35.4  Smokeless Tobacco Never    Social History   Substance and Sexual Activity  Alcohol Use No   Allergies: No Known Allergies  Current Facility-Administered Medications  Medication Dose Route Frequency Provider Last Rate Last Admin   0.9 %  sodium chloride  infusion  250 mL Intravenous PRN Thukkani, Arun K, MD       acetaminophen  (TYLENOL ) tablet 650 mg  650 mg Oral Q6H PRN Zheng, Michael, DO   650 mg at 12/26/23 1610   Or   acetaminophen  (TYLENOL ) suppository 650 mg  650 mg Rectal Q6H PRN Zheng, Michael, DO       amLODipine  (NORVASC ) tablet 10 mg  10 mg Oral Daily Williams, Evan, PA-C   10 mg at 12/26/23 1018   aspirin  EC tablet 81 mg  81 mg Oral Daily Johnie Nailer B, NP       atorvastatin  (LIPITOR) tablet 80 mg  80 mg Oral Daily Johnie Nailer B, NP   80 mg at 12/26/23 1018   feeding supplement (ENSURE PLUS HIGH PROTEIN) liquid 237 mL  237 mL Oral BID BM Machen, Julie, MD   237 mL at 12/26/23 1018   fenofibrate tablet 160 mg  160 mg Oral Daily Williams, Evan, PA-C   160 mg at 12/26/23 1150   heparin  ADULT infusion 100 units/mL (25000 units/250mL)  1,200 Units/hr Intravenous Continuous Paytes, Austin A, RPH       hydrALAZINE  (APRESOLINE ) injection 10 mg  10 mg Intravenous Q20 Min PRN Thukkani, Arun K, MD       insulin  aspart  (novoLOG ) injection 0-15 Units  0-15 Units Subcutaneous TID WC Zheng, Michael, DO   8 Units at 12/26/23 1226   insulin  glargine-yfgn (SEMGLEE ) injection 15 Units  15 Units Subcutaneous Daily Juberg, Christopher, DO       labetalol  (NORMODYNE ) injection 10 mg  10 mg Intravenous Q10 min PRN Thukkani, Arun K, MD       losartan  (COZAAR ) tablet 25 mg  25 mg Oral Daily Williams, Evan, PA-C   25 mg at 12/26/23 1018   nitroGLYCERIN  (NITROSTAT ) SL tablet 0.4 mg  0.4 mg Sublingual Q5 min PRN Jerilynn Montenegro, MD   0.4 mg at 12/25/23 1324   ondansetron  (ZOFRAN ) injection 4 mg  4 mg Intravenous Q6H PRN Thukkani, Arun K, MD       senna-docusate (Senokot-S) tablet 1 tablet  1 tablet Oral QHS PRN Zheng, Michael, DO       sodium chloride  flush (NS) 0.9 % injection 3 mL  3 mL Intravenous Q12H Thukkani, Arun K, MD   3 mL at 12/26/23 1610   sodium chloride  flush (NS) 0.9 % injection 3 mL  3 mL Intravenous PRN Thukkani, Arun K, MD        Facility-Administered Medications Prior to Admission  Medication Dose Route Frequency Provider Last Rate Last Admin   Study - SOS-AMI - placebo for selatogrel 0 mg/0.5 mL SQ injection for screening use only (PI-Christopher)  0.5 mL Subcutaneous Once Sheryle Donning, MD       Medications Prior to Admission  Medication Sig Dispense Refill Last Dose/Taking   Accu-Chek Softclix Lancets lancets Check blood sugar 2 times daily 100 each 12    amLODipine  (NORVASC ) 5 MG tablet TAKE 1 TABLET(5 MG) BY MOUTH DAILY 30 tablet 0    amoxicillin  (AMOXIL ) 500 MG capsule Take 500 mg by mouth 3 (three) times daily.      aspirin  EC 81 MG tablet Take 81 mg by mouth daily. Swallow whole.      atorvastatin  (LIPITOR) 80 MG tablet Take 1 tablet (80 mg total) by mouth daily. 30 tablet 0    benzonatate  (TESSALON ) 100 MG capsule Take 1 capsule (100 mg total) by mouth every 8 (eight) hours. 21 capsule 0    Blood Glucose Monitoring Suppl (ACCU-CHEK GUIDE ME) w/Device KIT Check blood sugars 2 times daily  1 kit 0    ezetimibe  (ZETIA ) 10 MG tablet Take 1 tablet (10 mg total) by mouth daily. 90 tablet 0    glucose blood (ACCU-CHEK GUIDE) test strip Check blood sugars 2 times daily 100 each 12    glucose blood test strip 1 each by Other route 2 (two) times daily. And lancets 2/day 200 each 3    insulin  glargine (LANTUS  SOLOSTAR) 100 UNIT/ML Solostar Pen Inject 30 Units into the skin at bedtime. 15 mL 1    Insulin  Lispro-aabc (LYUMJEV  KWIKPEN) 100 UNIT/ML KwikPen Inject 8-12 Units into the skin 3 (three) times daily before meals. 15 mL 2    Iron , Ferrous Sulfate , 325 (65 Fe) MG TABS Take one daily (Patient taking differently: Take 325 mg by mouth daily. Take one daily) 90 tablet 4    losartan  (COZAAR ) 50 MG tablet Take 1 tablet (50 mg total) by mouth daily. 30 tablet 1    magnesium  oxide (MAG-OX) 400 (240 Mg) MG tablet Take 1 tablet by mouth daily.      metFORMIN  (GLUCOPHAGE -XR) 500 MG 24 hr tablet Take 1 tablet (500 mg total) by mouth daily with breakfast. 180 tablet 3    metoprolol  succinate (TOPROL -XL) 25 MG 24 hr tablet Take 0.5 tablets (12.5 mg total) by mouth daily for 30 doses. 15 tablet 0    nitroGLYCERIN  (NITROSTAT ) 0.4 MG SL tablet Place 1 tablet (0.4 mg total) under the tongue every 5 (five) minutes as needed for chest pain. 25 tablet 3    ondansetron  (ZOFRAN -ODT) 4 MG disintegrating tablet Take 1 tablet (4 mg total) by mouth  every 8 (eight) hours as needed. 10 tablet 0    oseltamivir  (TAMIFLU ) 75 MG capsule Take 1 capsule (75 mg total) by mouth every 12 (twelve) hours. 10 capsule 0    Study - SOS-AMI - selatogrel 16 mg/0.5 mL or placebo SQ injection (PI-Christopher) Inject 16 mg into the skin once. For Investigational Use Only. Inject 0.5 mL subcutaneously into the abdomen or thigh if experiencing symptoms of a heart attack. Call 911 immediately and seek emergency medical support. Please contact [facility] with any questions or concerns regarding this medication.      Study -  SOS-AMI - selatogrel 16 mg/0.5 mL or placebo SQ injection (PI-Christopher) Inject 0.5 mLs (16 mg total) into the skin once for 1 dose. For Investigational Use Only. Inject 0.5 mL subcutaneously into the abdomen or thigh if experiencing symptoms of a heart attack. Call 911 immediately and seek emergency medical support. 1 mL 0     Family History  Problem Relation Age of Onset   Cancer Mother        Breast Cancer   Cancer Father        uncertain type   Diabetes Father    Cancer Brother 57       Colon Cancer   Colon cancer Neg Hx    Rectal cancer Neg Hx    Stomach cancer Neg Hx   Mother deceased in her 26's.  Father is deceased in his mid 2's.   Review of Systems:     Cardiac Review of Systems: Y or  [  N  ]= no  Chest Pain [  Y-on admission  ]   Exertional SOB  [  Y]     Pedal Edema [ N  ]    Palpitations [ Y ] Syncope  [ N ]     General Review of Systems: [Y] = yes [ N ]=no Constitional:  fatigue [ Y ]; nausea [  Y]; fever [ N ]; or chills [ N ]                                                               Dental: Last Dentist visit: About 20 years ago  Resp: cough Discordia.Diesel  ];  wheezing[ N ];  hemoptysis[N  ];  GI:  vomiting[ N ];  melena[ N ];  hematochezia [ N ];  GU: hematuria[ N ];                Skin: rash, swelling[ N ];, Musculosketetal:  joint pain[ Y-both knees. He has been told in the past he would need a right total knee replacement ];   Heme/Lymph:  anemia[ Y ];  Neuro: TIA[ N ];   stroke[N  ];    seizures[N  ];    Endocrine: diabetes[ Y ];                Physical Exam: BP (!) 167/64 (BP Location: Left Arm)   Pulse (!) 56   Temp 98 F (36.7 C) (Oral)   Resp 17   Ht 5\' 11"  (1.803 m)   Wt 78.9 kg   SpO2 97%   BMI 24.27 kg/m    General appearance: alert, cooperative, and no distress Head: Normocephalic, without obvious abnormality, atraumatic Neck: no  carotid bruit, no JVD, and supple, symmetrical, trachea midline Resp: clear to auscultation  bilaterally Cardio: RRR, no murmur GI: Soft, non tender, bowel sounds present Extremities: No LE edema.Decreased sensation both lower extremities Neurologic: Grossly normal  Diagnostic Studies & Laboratory data:     Recent Radiology Findings:   CARDIAC CATHETERIZATION Result Date: 12/26/2023   Mid LM to Dist LM lesion is 99% stenosed.   Ost Cx to Prox Cx lesion is 80% stenosed.   1st Mrg lesion is 60% stenosed.   Prox LAD lesion is 70% stenosed.   Prox RCA lesion is 50% stenosed.   Dist RCA lesion is 55% stenosed. 1.  Complex in-stent restenosis of ostial left circumflex lesion extending into the left main. 2.  Hemodynamically significant right coronary artery disease as assessed by 3D mapping with an FFR angiography of 0.70. 3.  LVEDP of 22 mmHg Recommendation: Cardiothoracic surgical consult.  Results were reviewed with the patient's daughter.   Dominance: Right    DG Chest 2 View Result Date: 12/25/2023 CLINICAL DATA:  Chest pain. EXAM: CHEST - 2 VIEW COMPARISON:  09/16/2023 FINDINGS: The lungs are clear without focal pneumonia, edema, pneumothorax or pleural effusion. The cardiopericardial silhouette is within normal limits for size. No acute bony abnormality. IMPRESSION: No active cardiopulmonary disease. Electronically Signed   By: Donnal Fusi M.D.   On: 12/25/2023 12:24     I have independently reviewed the above radiologic studies and discussed with the patient   Recent Lab Findings: Lab Results  Component Value Date   WBC 5.7 12/26/2023   HGB 12.1 (L) 12/26/2023   HCT 34.9 (L) 12/26/2023   PLT 146 (L) 12/26/2023   GLUCOSE 353 (H) 12/26/2023   CHOL 277 (H) 12/26/2023   TRIG 403 (H) 12/26/2023   HDL 37 (L) 12/26/2023   LDLDIRECT 186 (H) 12/26/2023   LDLCALC UNABLE TO CALCULATE IF TRIGLYCERIDE OVER 400 mg/dL 81/19/1478   ALT 18 29/56/2130   AST 17 10/22/2021   NA 136 12/26/2023   K 3.7 12/26/2023   CL 104 12/26/2023   CREATININE 0.99 12/26/2023   BUN 24 (H) 12/26/2023    CO2 24 12/26/2023   TSH 2.01 11/16/2021   INR 1.1 03/27/2020   HGBA1C 13.3 (H) 12/25/2023   Assessment / Plan:   Coronary artery disease-Left main disease included. He will be put on a  Heparin  drip.  Dr. Deloise Ferries to evaluate and determine if surgery is possible (distal vessels appear small) and if so, timing of surgery. 2. History of hyperlipidemia-on Atorvastatin  80 mg at hs and Zetia  10 mg daily 3. History of hypertension-on Losartan  25 mg daily and Amlodipine  10 mg daily. He was also on Toprol  XL 12.5 mg daily prior to admission 4. History of DM-poorly controlled with an HGA1C 13.3. On Metformin  XR 500 mg daily and Insulin  prior to surgery. 5. Peripheral neuropathy-per patient, both lower extremities. He may have been taking Neurontin  in the past    I  spent 30 minutes counseling the patient face to face.   Donielle Zimmerman PA-C 12/26/2023 1:16 PM     72yo male with LM/3V disease.  Hx of PCI, presents with anginal symptoms.  Preserved biventricular function and no significant valvular disease.  Will plan for 3V CABG tomorrow  LAD, PDA, OM  Demondre Aguas O Reilly Molchan

## 2023-12-26 NOTE — Progress Notes (Signed)
 CBG 403. Paged Internal Med. 15 units admin per Carleen Chary DO.

## 2023-12-26 NOTE — Discharge Instructions (Addendum)
 Discharge Instructions:  1. You may shower, please wash incisions daily with soap and water and keep dry.  If you wish to cover wounds with dressing you may do so but please keep clean and change daily.  No tub baths or swimming until incisions have completely healed.  If your incisions become red or develop any drainage please call our office at [phone]  2. No Driving until cleared by Dr. Raina Bunting office and you are no longer using narcotic pain medications  3. Monitor your weight daily.. Please use the same scale and weigh at same time... If you gain 5-10 lbs in 48 hours with associated lower extremity swelling, please contact our office at [phone]  4. Fever of 101.5 for at least 24 hours with no source, please contact our office at [phone]  5. Activity- up as tolerated, please walk at least 3 times per day.  Avoid strenuous activity, no lifting, pushing, or pulling with your arms over 8-10 lbs for a minimum of 6 weeks  6. If any questions or concerns arise, please do not hesitate to contact our office at [phone]   [facility] MIN[facility] Address: [address] [place], [place] [address] Phone Number: [phone] Hours of Operation: Residents of [place] can come to obtain food Monday through Friday from 8:30am until 3:30pm. Photo ID and Social Security cards required for all residents of a household. Can come six times a year  [facility] Address: [address] [place], [place] [address] Phone Number: [phone] Hours of Operation: Operates Tuesday-Friday 10:00 a.m. to 1 p.m. Requirements: Referral from DSS needed. May come 6 times a year, 30 days apart. Photo ID and SS required for all residents of household.  [facility] Address: [address] [place], [place] [address] Phone Number: [phone] Hours of Operation: Food pantry is open on the last Saturday of each month from 10:00 am - 12:00 noon. No appointment needed. No  qualifications.  [facility] Address: [address] [place], [place] [address] Phone Number: [phone] EXT. 21 Hours of Operation: Must make reservations to pick up food on Saturdays. Sign ups for Saturday pick up beginning at 8:30 a.m. on Monday morning.  [facility] Address: [address] [place], [place] [address] Phone Number: [phone] Hours of Operation: If you need food, bring proper identification such as a driver's license to receive a bag of food once a month. Requirements: Can come once every 30 days with referral DSS, Holiday representative, Mental health etc. Each referral good for six visits. Photo ID required. *1st visit no referral required.  [facility] Address: [address] [place], [place] [address] Phone Number: [phone]  [place] [facility] Address: [address] [place], [place] [address] Phone Number: [phone] Hours of Operation:  You can register at [email or web address] for free groceries  [facility] Address: [address], [place] [address] Phone Number: [phone] Hours of Operation: Drive through giveaway, first come first served. Every 3rd Saturday 11AM - 1PM  [facility] [address] Address: [address], [place] [address] Phone Number: [phone]   [place]  HAND TO HAND FOOD PANTRY Address: [address] [place], [place] [address] Phone Number: [phone] Hours of Operation: Once a month every 3rd Saturday  [facility] Address: [address] [place], [place] [address] Phone Number: [phone] Hours of Operation: Distribution happens from 9:00-10:00 a.m. every Saturday.     [facility] Address: [address] [place], [place] [address] Phone Number: [phone] Hours of Operation: ONCE a week for the community food distribution held every Tuesday, Wednesday and Thursday from 11 a.m. - 2:00 p.m. Food is  available on a first come, first serve basis and varies week to week. No appointment necessary for drive thru pick up.  [facility] Address: [address] [place], [place] [address] Phone Number: [phone] Hours of Operation: Open every 3rd Thursday 9:30 a.m. - 11:00 a.m.  [facility] Address: [address] [place], [place] [address] Phone Number: [phone] Hours of Operation: Please call for hours, directions, and questions  [facility] [place] [facility] Address: [address], [place], [place]  [address] Phone Number: [phone] Website: https://www.Hollyguns.co.za Food Finder app: https://findfood.ghpfa.org  [facility] [employer] Address: [address] [place], [place] [address] Phone Number: [phone] Hours of Operation: Contact Bree Harpe. Enrolled Substance Abuse Clients Only  [facility] Address: [address] [place], [address]  Phone Number: [phone] Hours of Operation: Contact Alene Ana. Food pantry open the 3rd Saturday of each month from 9 a.m. -12 p.m. only  [place] [facility] Address: [address] [place], [place] [address] Phone Number: [phone] Hours of Operation: Contact Loletta Ripple. Emergency food bank open on Saturdays by appointment only  [facility] Address: [address] [place], [place] [address] Phone Number: [phone] Hours of Operation: No specific contact person; Anyone can help  [facility] Address: [address] [place], [place] [address] Phone Number: [phone] Hours of Operation: Contact Julia Oats. Agency gives out a bag of food every Thursday from 2-4 p.m. only, and also provides a community meal every Thursday between 5-6 p.m. Other services provided include rent/mortgage and utility assistance, women's winter shelter, thrift store, and senior adult activities.  [facility] [place] Address: [address] [place], [place] [address] Phone Number: [phone] Hours of Operation: The Emergency Food Assistance Program provides individuals and families with a generous supply of food including meat, fresh vegetables, and nonperishable items. The food box contains five days' worth of food, and each family or individual can receive a box once per month. M, W, Th, Fr 11am-2pm, walk-ins welcome.  [facility] Address: [address] [place], [place] [address]  Phone Number: [phone] Hours of Operation: Contact Asia Blanca Bunch. Tuesdays and Thursdays from 11am - 3pm by appointment only  Sunday, by APPOINTMENT ONLY  [address] of [place], [address] [place], [address], [phone], 3.2 mi from [facility], call in advance for appointment at 10:00am or at 4:00pm, must provide valid photo ID  Monday  9:30am-5:00pm [facility], [address] [place] [phone], [phone], 0.9 mi from [facility], can come four times per year, bring your photo ID and SS cards for other residents of household, will make appointments for those who work and need to come after 5pm  10:00am-12:00noon [employer], [address], [address], [phone], 1.7 mi from [facility], can come once every 60 days per household, need referral from DSS, [employer], etc., bring photo ID and SS card   10:00am-1:00pm Lizabeth Riggs the [facility],  [address] [place], [address], [phone], 7.7 mi from [facility], can come once every thirty days with a referral from DSS, [employer], [employer],  etc. -- each referral good for six visits, bring photo ID   10:00am-1:00pm [address], [address], [phone], 4.2 mi from [facility], can come once every 6 months, open to [facility] residents, bring photo ID and copy of a current utility bill in your  name, please call first to verify that food is available  6:30pm-8:30pm [facility], [address] [address], [phone], 3.2 mi from [facility], can come once every 30 days, maximum 6 times per year, bring your photo ID and SS numbers for other residents of household  Monday by APPOINTMENT ONLY  [facility], [address], [address],  [phone], 2.5 mi from [facility], call in advance for appointment between 10:00am-2:00pm, bring your photo ID and SS cards for all residents of household, can come once every 3 months  [facility], [address], [address], [phone], 0.7 mi from [facility], call in advance for appointment, can come once every 30 days, bring your photo ID and SS cards for other residents of household   Tuesday  9:00am-12:00noon [employer], [address], [address], [phone], 1.3 mi from [facility], can come once every 3 months, bring your photo ID and SS numbers for other residents of household   9:00am-1:00pm [facility] [address],  [place], [address], [phone]/Ext [phone], 1.6 mi from [facility], can come once every two weeks  9:30am-5:00pm [employer], [address] [place] [phone], [phone], 0.9 mi from [facility], can come four times per year, bring your photo ID and SS cards for other residents of household, will make appointments for those who work and need to come after 5pm  10:00am-12:00noon [employer], [address] [place]  [place], [address], [phone], 1.7 mi from [facility], can come once every 60 days per household, need referral from DSS, [employer], etc., bring photo ID and SS card  10:00am-1:00pm [facility], [id number],  [phone], 3.8 mi from [facility], with referral from DSS, may come six times, 30 days apart, bring your photo ID and SS cards for all residents of household   10:00am-1:00pm  [address] the [facility], [address] [place], [address], [phone], 7.7 mi from [facility], can come once every thirty days with a referral from DSS, [employer], Mental Health, etc.- each referral good for six visits, bring photo ID   10:00am-1:00pm [address] [address], [phone], 4.2 mi from [facility], can come once every 6 months, open to [facility] residents, bring photo ID and copy of a current utility bill in your name, please call first to verify that food is available  2:00pm-3:30pm [facility], [address], [phone], [phone], 3.7 mi from [facility], can come twelve times per year, one bag per family, bring photo ID   FIRST AND THIRD Tuesdays  10:00am-1:00pm, [facility], [address], [address], [phone], 7.2 mi from [facility], can come once every 30 days   Tuesday, WHEN FOOD IS AVAILABLE (call)  12:00noon-2:00pm [facility], [address], [address], [phone], 1.5 mi from  [facility], can come once every 30 days, bring photo  ID   Tuesday, by APPOINTMENT ONLY  [facility], [address],  [phone], 2.5 mi from [facility], call in advance for appointment between 1:00pm-4:00pm, bring your photo ID and SS cards for all residents of household, can come once every 3 months   [address] of [address], [address], [address], [phone], 5 mi from [facility], call one day ahead for appointment the next day between 10:00am and 12:00noon, can come once every 3 months, bring your photo ID and must qualify according to family income   [facility], [address], [address], [phone], 0.7 mi from [facility], call in advance for appointment, can come once every 30 days, bring your photo ID and SS cards for other residents of household    [address] of [place], [address], [address],  [phone], 5.1 mi from [facility], call between 9:00am and 1:00pm M-F to make appointment. Appointments are scheduled for Tues and Thurs from 10:00am-11:30am, bring photo ID, can come once every 6 months, limit three visits over 18 months, then must have referral  Wednesday  9:30am-5:00pm [facility] [place], [address] [place] [phone], [phone], 0.9 mi from [facility], can come four times per year, bring your photo ID and SS cards for other residents of household, will make appointments for those who work and need to come after 5pm  9:30am-11:30am [employer] [facility], [address], [address], [phone], 6.6 mi from [facility], can come once every 30 days, bring your photo ID, and SS cards for other residents of household, each monthly visit requires a written referral from GUM or DSS with number in household on form  10:00am-12:00noon [address]  [place], [address], [phone], 1.7 mi from [facility], can come once every 60 days per household, need referral from DSS, [employer], etc., bring photo ID and SS card  10:00am-1:00pm [facility], [id number],  [phone], 3.9 mi from [facility], with referral from DSS, may come six times, 30 days apart, bring your photo ID and SS cards for all residents of household   10:00am-1:00pm [address] the [facility], [address] [place], [address], [phone], 7.7 mi from [facility], can come once every thirty days with a referral from DSS, [employer], Mental Health, etc. - each referral good for six visits, bring photo ID   2:00pm-5:45pm [address], [address] [place], [address], [phone], 3.2 mi from [facility], once every 30 days, first come/first served, limited to first 25, bring photo ID   6:30pm-8:30pm [facility]  [facility], [address] [address], [phone], 3.2 mi from [facility], can come once every 30 days, maximum 6 times per year, bring your photo ID and SS numbers for other residents of household  FIRST and THIRD Wednesdays   9:00am-12:00noon, [facility], [address], [address], [phone], 4.2 mi from [employer], can come once a month. Please arrive and sign in no later than 11:15 so everyone can be served by 12 noon.  THIRD Wednesday  1:30pm-3:00pm, [place] [place], [address], [phone] or [phone], 2.1 mi from [facility]  Wednesday, by APPOINTMENT ONLY  [facility] [employer], [place]  [address] [address], [phone], 5 mi from [facility], call one day ahead for appointment the next day between 10:00am and 12:00noon, can come once every 3 months, bring your photo ID and must qualify according to family income   [facility], [address], [address], [phone], 0.7 mi from [facility], call in advance for appointment, can come once every 30 days, bring your photo ID and SS cards for other residents of household   [address] of [place], [address] [place], [address], [phone], 3.2 mi from [facility], call in advance for appointment at 7:00pm, must provide valid photo ID  Thursday  9:00am-12:00noon [employer], [address], [address], [phone], 1.3 mi from [facility], can come once every 3 months, bring your photo ID and SS numbers for other residents of household   9:00am-1:00pm [facility] [address],  [place], [address], [phone]/Ext 1, 1.6 mi from [facility], can come once every two weeks  9:30am-12:00noon [employer], [address] [address], [phone], 4.5 mi from [facility], can come once every 30 days, must state income [closed Thanksgiving and week of Christmas]  9:30am-5:00pm [employer], [address] [place] [phone], [phone], 0.9 mi from [facility], can come four times per year, bring your photo ID and SS card for other residents of household, will make appointments for those who work and need to come after 5pm  10:00am-1:00pm [employer], [address] [place], [id number],  [phone], 3.9 mi from [facility], with referral from DSS, may come six times, 30 days apart, bring your photo ID and SS cards for all residents of household   10:00am-1:00pm [address] the [facility], [address] [place], [address], [phone], 7.7 mi from [facility], can come once every thirty days with a referral from DSS, [employer], Mental Health, etc.- each referral good for six visits, bring photo ID   10:00am-1:00pm [facility], [address], [address], [phone], 4.2 mi from [facility], can come once every 6 months, open to [facility] residents, bring photo ID and copy of a current utility bill in your name, please call first to verify that food is available   SUNDAYS BREAKFAST TWO LOCATIONS: 8:00am served in [facility] by Awaken [employer] 8:30am SHUTTLE provided from [facility], served at [employer], [address] LUNCH TWO LOCATIONS [plus one additional third Sunday only] 10:30am - 12:30pm served at [employer], [employer], [place] [address] (1.2 miles from [facility]) 12:30pm served in [place] by Land O'Lakes Team (THIRD Sunday only) 1:30pm served at [facility] by [facility] one location [plus one additional third Sunday only] 5:00pm Every Sunday, served under the bridge at [address] by Lige Reeve Under the 3M Company (.7 miles from [facility]) (THIRD Sunday ONLY) 4:00pm served in the parking garage, across from [employer], corner of [place] and [place] by Ryland Group Works  Ministries MONDAYS BREAKFAST 7:30am served in [employer] by the [employer] and Friends LUNCH 10:30am - 12:30pm served at [employer], [employer], [place] [address] (1.2 miles from [facility]) DINNER TWO LOCATIONS: 7:00pm served in front of the courthouse at the corner of Goldman Sachs and CHS Inc. by  RE4HIM Monday Night Meal (3 blocks from [facility]) 4:30pm served at the [employer], [address] by Bank of New York Company (0.6 miles from [facility]) TUESDAYS BREAKFAST 8:00am - 9:00am served at The TJX Companies, [address] (0.3 miles from [place]) LUNCH 10:30am - 12:30pm served at the [employer], [employer] [address], (1.2 miles from [place]) DINNER 6:00pm served at CSX Corporation, enter from Capital One and go to the Sonic Automotive, (0.7 miles from [place]) [facility] BREAKFAST 7:00am - 8:00am served at [employer], [employer] [address], (1.2 miles from Dell) LUNCH ONE LOCATION [plus two additional locations listed below] 10:30am - 12:30pm served at [employer], [employer] [address], (1.2 miles from [place]) (FIRST Wednesday ONLY) 11:30am served at Dillard's, [place] [address] (6.6 miles from [place]) (SECOND Wednesday ONLY) 11:00am served at [place]. [place]on [place]ine of [address], [address] (1.3 miles from [place]) [place] TWO LOCATIONS 6:00pm served at W. R. Berkley, [place] [address] Visteon [address]. (1.3 miles from [facility]) 4:00pm - 6:00pm (hot dogs and chips) served at Levi Strauss of [facility], [address] (1.7 miles from [place]) [place] BREAKFAST NOT AVAILABLE AT THIS TIME LUNCH 10:30am - 12:30pm served at [employer], [employer],  [place] [address] (1.2 miles from [place]) DINNER 6:00pm served at [employer], enter from Capital One and go to the S[employer], (0.7 miles from [employer] Corporation) [place] BREAKFAST NOT AVAILABLE AT THIS TIME LUNCH 10:30am - 12:30pm served at [employer], [employer] [address] (1.2 miles from [place]) DINNER TWO LOCATIONS, [plus one additional first Friday only] 6:00pm served under the bridge at [address] by Lige Reeve Under [employer]. (.7 miles from [facility]) 5:00pm - 7:00pm served at Levi Strauss of [facility], [address] (1.7 miles from [place]) (FIRST Friday ONLY) 5:45 pm - SHUTTLE provided from the LIBRARY at 5:45pm. Served at [facility], [address]. SATURDAYS BREAKFAST TWO LOCATIONS [plus one additional last Saturday only] 8:00am served at [facility] by Delphi 8:30am served at [employer], [address] (2.2 miles from [facility]) (LAST Saturday ONLY) 8:30am served at [employer], [address] (5 miles from [place]) LUNCH 10:30am - 12:30pm served at [employer], [employer] [address] (1.2 miles from [facility]) DINNER 6:00pm served under the bridge at [address] by World Fuel Services Corporation (0.7 miles from Nucor Corporation)  DIRECTIONS FROM [place] TO ALL MEAL LOCATIONS The Bridge at [address] (.7 miles from [address]) [address] on [place]. Turn Right onto DIRECTV 433 ft. Continue onto [address] under bridge, about 500 ft. Courthouse (3 blocks from [facility]) [place] on [address]. Turn right on [address]  1 block to PPL Corporation (.5 miles from [place]) [place] on [place]. YRC Worldwide. past Brink's Company to EMCOR. Enter from Capital One and  go to the Affiliated Computer Services building W. R. Berkley [address]. (1.3 miles from [facility])  Head [place] on [place]. Turn Right onto W. Visteon Corporation. church will be on the Left. The TJX Companies [address] (.3 miles from [facility]) Go .3 miles on [address] Destination is on your right Dillard's at [employer]OK (6.6 miles from [address]) [address] on [place] toward [address] Turn right onto [address] Continue onto Alcoa Inc. Continue onto Toll Brothers. 5. Latina Pol is on right Sanmina-SCI Futures trader) [address] (.6 miles from [place]) [place] on [place]. [address] Turn Left onto [address] 0.3 miles Destination is on the Left. [place] Black & Decker at American Express (5 miles from Nucor Corporation) 1. Head south on [address]. Turn right onto [address] Turn slightly left onto Quest [employer]ostics Continue onto [employer]ostics Turn right at Barnes & Noble Continue to church on right [place] Sounds of [facility] [place] [address] (1.7 miles from [place]) [address] on [place] 1.4 miles [place] becomes [place]. [address] Continue 0.6 miles and church will be on theright. [place] Guardian Life Insurance at [address] (2.5 miles from Nucor Corporation) [place] provided from Massachusetts Mutual Life [place]] [place] on [place]. [address] toward [employer] right onto [employer] left onto [employer] Turn left onto [employer] [address] [place]  [place] (2.2 miles from [address]) [address] on [place] 1.4 miles [place] becomes [place]. [place] [address] Turn right onto [address] [place]  [address] and church will be on the Left. [facility] [employer] [address] (1.2 miles from [facility]) 1.Turn right onto [facility] 2.Turn left onto [place] 3.[place] 4.Destination is on your right [place]. [place] of [address] at [employer] (1.3 miles from [facility]) [address] on [address] Turn left onto [employer] right onto [address] [place]. Continue onto [employer]	[place]. Turn left onto [employer]. Turn right onto [employer].

## 2023-12-26 NOTE — Progress Notes (Signed)
 Pt returned to room 6e30 via bed from cath lab.

## 2023-12-26 NOTE — Care Management Obs Status (Signed)
 MEDICARE OBSERVATION STATUS NOTIFICATION   Patient Details  Name: Scott Donaldson MRN: [id number] Date of Birth: Aug 30, 1950   Medicare Observation Status Notification Given:       Janith Melnick 12/26/2023, 10:31 AM

## 2023-12-26 NOTE — Progress Notes (Signed)
 HD#0 SUBJECTIVE:  Patient Summary: Scott Donaldson is a 73 y.o. with a pertinent PMH of NSTEMI (2023) s/p PCI and stent placement, HTN, diabetes, and HLD , who presented with CP and admitted for unstable angina. Now s/p LHC on 6/2 with complex in-stent restenosis of ostial Lcx with involvement of L main and RCA disease.  Overnight Events and Interim History: LHC this am with extensive multi-vessel disease, Circumflex, L Main, R. Cardiology rec CABG with consult to come. He denies concerns of complaints. He has no prolonged return of angina, but occasional stabbing pain that is mild. Very mild intermittent nausea but no emesis and eating very well. Has many family in room including great grandaughter.  Significant Hospital Events:  12/26/23 LHC LM 99% stenosed, Ost Cx 80% stenosed, RCA disease. Rec CABG  OBJECTIVE:  Vital Signs: Vitals:   12/26/23 0803 12/26/23 0805 12/26/23 0810 12/26/23 0824  BP: (!) 145/69 (!) 156/72  (!) 161/78  Pulse: (!) 48 (!) 57 (!) 0 (!) 59  Resp: 18 16  16   Temp:    97.8 F (36.6 C)  TempSrc:    Oral  SpO2: 95% 97%  99%  Weight:      Height:       Supplemental O2: Room Air SpO2: 99 %  Filed Weights   12/25/23 1153  Weight: 78.9 kg    Intake/Output Summary (Last 24 hours) at 12/26/2023 0950 Last data filed at 12/26/2023 1610 Gross per 24 hour  Intake 3 ml  Output --  Net 3 ml   Net IO Since Admission: 3 mL [12/26/23 0950]  Physical Exam: Physical Exam Constitutional:      General: He is not in acute distress.    Appearance: He is well-developed. He is not ill-appearing.  Cardiovascular:     Rate and Rhythm: Normal rate and regular rhythm.     Heart sounds: Normal heart sounds.  Pulmonary:     Effort: Pulmonary effort is normal.  Musculoskeletal:     Right lower leg: No tenderness. No edema.     Left lower leg: No tenderness. No edema.     Comments: R wrist access site stable, compression in place  Skin:    General: Skin is warm and dry.   Neurological:     Mental Status: He is alert and oriented to person, place, and time.    Patient Lines/Drains/Airways Status     Active Line/Drains/Airways     Name Placement date Placement time Site Days   Peripheral IV 12/25/23 20 G Anterior;Left Antecubital 12/25/23  1244  Antecubital  1   Peripheral IV 12/26/23 20 G Anterior;Distal;Right;Upper Arm 12/26/23  0416  Arm  less than 1            ASSESSMENT/PLAN:  Assessment: Principal Problem:   Chest pain  Scott Donaldson is a 73 y.o. with a pertinent PMH of NSTEMI (2023) s/p PCI and stent placement, HTN, diabetes, and HLD , who presented with CP and admitted for unstable angina. Now s/p LHC on 6/2 with complex in-stent restenosis of ostial Lcx with involvement of L main and RCA disease.  Plan: Multivessel CAD Unstable Angina History of NSTEMI s/p circumflex stent (2023) LCH this am with extensive disease - in-stent restenosis of ostial L Cx and extension to L man and RCA disease as well. Cardiology consulting CT surg with rec of CABG. At the moment, pt is hemodynamically stable and pending surgical eval. Angina has not returned since relief in ED. -  Cardiology following, thank you, will follow their recs - ASA 81mg  daily - Atorvastatin  80 daily - ECHO pending - Continue Heparin  - Heart healthy diet - Nitroglycerin , tylenol  prn - Monitor K, MG, replace as needed. K > 4, Mg > 2  Hyperglycemia Poorly controlled Type 2 diabetes A1c above 13. CBG 400 this morning and so gave 15 of aspart. He reports taking insulin  out of hospital, but it is unclear if this is regular. Blood sugar of 578 at admission without anion gap, need better control. Will start long-acting 15 daily and continue SSI, titrate as needed. - Diabetes coordinator involved, thank you, will follow recs - Semglee  15 units daily to start today  HTN Resumed home losartan  25 daily per cardiology (pt not taking). Started Amlodipine  10 daily. Anticipate holding  losartan  for surgery.  HLD LDL above 180. Resume high intensity statin atorvastatin  80 per above. Cardiology also started fenofibrate.   SDOH Report that cost is a barrier to his medication and PCP follow up. Will discuss with TOC for med assistance and PCP needs.  Best Practice: Diet: Cardiac diet IVF: None VTE: Heparin  Code: Full AB: None Therapy Recs: n/a DISPO: Anticipated discharge TBD to Home vs SNF pending CT surg/ CABG workup.  Signature: Carleen Chary, D.O.  Internal Medicine Resident, PGY-1 [facility]  Pager: # [phone]. 9:50 AM, 12/26/2023

## 2023-12-26 NOTE — Inpatient Diabetes Management (Addendum)
 Inpatient Diabetes Program Recommendations  AACE/ADA: New Consensus Statement on Inpatient Glycemic Control (2015)  Target Ranges:  Prepandial:   less than 140 mg/dL      Peak postprandial:   less than 180 mg/dL (1-2 hours)      Critically ill patients:  140 - 180 mg/dL   Met w/ pt and family at bedside.  Pt and daughter told me pt stopped his Lantus  and Lyumjev  insulins about 7-8 mos ago due to cost.  Was costing >$200 per month.  Pt was seeing Dr. Aldona Amel with [facility]--Looks like the last time pt was seen by Dr. Aldona Amel was 10/08/2022.  Instructions given at that appt were to Continue Metformin  1000 mg BID and Lantus  30 units at HS and then to Start Lyumjev  insulin  8-12 units TID with meals.  When pt stopped his Lantus  and Lyumjev  insulins, he went to [facility] and purchased 70/30 insulin  vials at the pharmacy and began doing self with 70/30 Insulin  25 units QPM with Dinner (took 30 units when CBG was 300-400)--Was not taking 70/30 insulin  in the AM.  Per daughter, needs new CBG meter for home.  Pt may be interested in CGM for home if insurance covers.  Does not have current PCP--was dismissed from practice due to no shows.  Might be able to get back with [facility]--should still be active with that practice.  Asked family to call over to [facility] and get an appt for pt.  Per family, pt will be here several days as he needs CABG.    I discussed with pt and family pt's current A1c of 13.3%.  Explained what an A1c is and what it measures.  Reminded patient that his goal A1c is 7% or less per ADA standards to prevent both acute and long-term complications.  Explained to patient the extreme importance of good glucose control at home.  Encouraged patient to check his CBGs at least TID AC at home and to record all CBGs in a logbook for his PCP or Endocrinologist to review.  Discussed with pt and family that pt should not be self doing insulin  at home.  We will continue to assess and monitor CBGs and  will make insulin  adjustments in the hospital as needed.  Also explained to pt and family that he will be managed on an IV Insulin  drip for Surgery.  Family very thankful for all the info provided.  Talked with [facility] team member Cornelius Dill.  OP pharmacy contacted for insulin  pricing.  Per OP Pharmacy, Lantus  and Humalog  are both $25 co-pays.    I looked up https://www.medicare.gov/coverage/insulin  This is the info provided:  Insulin  Medicare Part B [employer]) covers insulin  if you use an insulin  pump that's covered under Part B's durable medical equipment benefit. Part B doesn't cover insulin  pens or insulin -related supplies like:  Syringes Needles Alcohol swabs Gauze  If you have a Part D plan, it may cover:  Injectable insulin  that isn't used with a traditional insulin  pump Insulin  used with a disposable insulin  pump Certain medical supplies used for insulin  injections, like syringes, gauze, and alcohol swabs Insulin  that's inhaled Your costs in Original Medicare The cost of a one-month supply of each Part D- and Part B-covered insulin  product is capped at $35, and you don't have to pay a deductible for insulin . If you get a three-month supply of insulin , your costs can't be more than $35 for each month's supply of each covered insulin  product. This means you'll generally pay no  more than $105 for a three-month supply of covered insulin . Under Part D, the $35 cap applies to everyone who takes insulin , ever if you get Extra Help. If you have Part B and Medicare supplement Insurance Press photographer) that pays your Part B coinsurance, your plan should cover the $35/month (or less) cost for each covered insulin . For insulin -related supplies (like syringes, needles, alcohol swabs and gauze), you'll pay 100% of the cost under Part B (unless you have Part D).    --Will follow patient during hospitalization--  Langston Pippins RN, MSN, CDCES Diabetes Coordinator Inpatient Glycemic  Control Team Team Pager: [phone] (8a-5p)

## 2023-12-26 NOTE — TOC CM/SW Note (Signed)
 Transition of Care [facility]) - Inpatient Brief Assessment   Patient Details  Name: Scott Donaldson MRN: [id number] Date of Birth: 01-Jan-1951  [facility] CM/SW Contact:    Juliane Och, LCSW Phone Number: 12/26/2023, 11:47 AM   Clinical Narrative:  11:48 AM Per chart review, patient resides at home with relatives. Patient does not have SNF/HH/DME history. Patient has insurance but not a PCP listed on chart. Patient and patient's family accepted CSW of SDOH (food) resources. CSW provided food assistance resources. Patient and patient's family confirmed medication assistance needs. TOC will continue to follow.  Transition of Care Asessment: Insurance and Status: Insurance coverage has been reviewed Patient has primary care physician: No (Followed by Cardiology) Home environment has been reviewed: Private Residence Prior level of function:: N/A Prior/Current Home Services: No current home services Social Drivers of Health Review: SDOH reviewed interventions complete Readmission risk has been reviewed: Yes Transition of care needs: transition of care needs identified, TOC will continue to follow

## 2023-12-26 NOTE — Interval H&P Note (Signed)
 History and Physical Interval Note:  12/26/2023 6:52 AM  Scott Donaldson  has presented today for surgery, with the diagnosis of unstable angina.  The various methods of treatment have been discussed with the patient and family. After consideration of risks, benefits and other options for treatment, the patient has consented to  Procedure(s): LEFT HEART CATH AND CORONARY ANGIOGRAPHY (N/A) as a surgical intervention.  The patient's history has been reviewed, patient examined, no change in status, stable for surgery.  I have reviewed the patient's chart and labs.  Questions were answered to the patient's satisfaction.     [name]

## 2023-12-26 NOTE — Plan of Care (Signed)
  Problem: Pain Managment: Goal: General experience of comfort will improve and/or be controlled Outcome: Progressing   Problem: Safety: Goal: Ability to remain free from injury will improve Outcome: Progressing   Problem: Skin Integrity: Goal: Risk for impaired skin integrity will decrease Outcome: Progressing

## 2023-12-27 ENCOUNTER — Other Ambulatory Visit: Payer: Self-pay

## 2023-12-27 ENCOUNTER — Encounter (HOSPITAL_COMMUNITY): Payer: Self-pay | Admitting: Internal Medicine

## 2023-12-27 ENCOUNTER — Other Ambulatory Visit (HOSPITAL_COMMUNITY): Payer: Self-pay

## 2023-12-27 ENCOUNTER — Observation Stay (HOSPITAL_COMMUNITY): Admitting: Anesthesiology

## 2023-12-27 ENCOUNTER — Telehealth (HOSPITAL_COMMUNITY): Payer: Self-pay | Admitting: Pharmacy Technician

## 2023-12-27 ENCOUNTER — Observation Stay (HOSPITAL_COMMUNITY)

## 2023-12-27 ENCOUNTER — Inpatient Hospital Stay (HOSPITAL_COMMUNITY)
Admission: EM | Disposition: A | Discharge status: Home Health | Payer: Self-pay | Source: Home / Self Care | Attending: Thoracic Surgery (Cardiothoracic Vascular Surgery)

## 2023-12-27 ENCOUNTER — Inpatient Hospital Stay (HOSPITAL_COMMUNITY)

## 2023-12-27 DIAGNOSIS — Z4682 Encounter for fitting and adjustment of non-vascular catheter: Secondary | ICD-10-CM | POA: Diagnosis not present

## 2023-12-27 DIAGNOSIS — N400 Enlarged prostate without lower urinary tract symptoms: Secondary | ICD-10-CM | POA: Diagnosis present

## 2023-12-27 DIAGNOSIS — Z951 Presence of aortocoronary bypass graft: Secondary | ICD-10-CM

## 2023-12-27 DIAGNOSIS — Z452 Encounter for adjustment and management of vascular access device: Secondary | ICD-10-CM | POA: Diagnosis not present

## 2023-12-27 DIAGNOSIS — Z9079 Acquired absence of other genital organ(s): Secondary | ICD-10-CM | POA: Diagnosis not present

## 2023-12-27 DIAGNOSIS — I110 Hypertensive heart disease with heart failure: Secondary | ICD-10-CM

## 2023-12-27 DIAGNOSIS — Z9889 Other specified postprocedural states: Secondary | ICD-10-CM | POA: Diagnosis not present

## 2023-12-27 DIAGNOSIS — Z794 Long term (current) use of insulin: Secondary | ICD-10-CM | POA: Diagnosis not present

## 2023-12-27 DIAGNOSIS — I25700 Atherosclerosis of coronary artery bypass graft(s), unspecified, with unstable angina pectoris: Secondary | ICD-10-CM | POA: Diagnosis not present

## 2023-12-27 DIAGNOSIS — D62 Acute posthemorrhagic anemia: Secondary | ICD-10-CM | POA: Diagnosis not present

## 2023-12-27 DIAGNOSIS — I200 Unstable angina: Secondary | ICD-10-CM | POA: Diagnosis not present

## 2023-12-27 DIAGNOSIS — I509 Heart failure, unspecified: Secondary | ICD-10-CM

## 2023-12-27 DIAGNOSIS — I447 Left bundle-branch block, unspecified: Secondary | ICD-10-CM | POA: Diagnosis not present

## 2023-12-27 DIAGNOSIS — Z555 Less than a high school diploma: Secondary | ICD-10-CM | POA: Diagnosis not present

## 2023-12-27 DIAGNOSIS — Z800 Family history of malignant neoplasm of digestive organs: Secondary | ICD-10-CM | POA: Diagnosis not present

## 2023-12-27 DIAGNOSIS — I2510 Atherosclerotic heart disease of native coronary artery without angina pectoris: Secondary | ICD-10-CM | POA: Diagnosis present

## 2023-12-27 DIAGNOSIS — E1165 Type 2 diabetes mellitus with hyperglycemia: Secondary | ICD-10-CM | POA: Diagnosis not present

## 2023-12-27 DIAGNOSIS — E1140 Type 2 diabetes mellitus with diabetic neuropathy, unspecified: Secondary | ICD-10-CM | POA: Diagnosis not present

## 2023-12-27 DIAGNOSIS — Y831 Surgical operation with implant of artificial internal device as the cause of abnormal reaction of the patient, or of later complication, without mention of misadventure at the time of the procedure: Secondary | ICD-10-CM | POA: Diagnosis present

## 2023-12-27 DIAGNOSIS — I252 Old myocardial infarction: Secondary | ICD-10-CM | POA: Diagnosis not present

## 2023-12-27 DIAGNOSIS — R0989 Other specified symptoms and signs involving the circulatory and respiratory systems: Secondary | ICD-10-CM | POA: Diagnosis not present

## 2023-12-27 DIAGNOSIS — Z5986 Financial insecurity: Secondary | ICD-10-CM | POA: Diagnosis not present

## 2023-12-27 DIAGNOSIS — Z7984 Long term (current) use of oral hypoglycemic drugs: Secondary | ICD-10-CM | POA: Diagnosis not present

## 2023-12-27 DIAGNOSIS — G629 Polyneuropathy, unspecified: Secondary | ICD-10-CM | POA: Diagnosis not present

## 2023-12-27 DIAGNOSIS — E119 Type 2 diabetes mellitus without complications: Secondary | ICD-10-CM | POA: Diagnosis not present

## 2023-12-27 DIAGNOSIS — I973 Postprocedural hypertension: Secondary | ICD-10-CM | POA: Diagnosis not present

## 2023-12-27 DIAGNOSIS — R531 Weakness: Secondary | ICD-10-CM | POA: Diagnosis not present

## 2023-12-27 DIAGNOSIS — I5032 Chronic diastolic (congestive) heart failure: Secondary | ICD-10-CM | POA: Diagnosis not present

## 2023-12-27 DIAGNOSIS — I10 Essential (primary) hypertension: Secondary | ICD-10-CM | POA: Diagnosis not present

## 2023-12-27 DIAGNOSIS — J9811 Atelectasis: Secondary | ICD-10-CM | POA: Diagnosis not present

## 2023-12-27 DIAGNOSIS — I25110 Atherosclerotic heart disease of native coronary artery with unstable angina pectoris: Secondary | ICD-10-CM

## 2023-12-27 DIAGNOSIS — R918 Other nonspecific abnormal finding of lung field: Secondary | ICD-10-CM | POA: Diagnosis not present

## 2023-12-27 DIAGNOSIS — Z833 Family history of diabetes mellitus: Secondary | ICD-10-CM | POA: Diagnosis not present

## 2023-12-27 DIAGNOSIS — J939 Pneumothorax, unspecified: Secondary | ICD-10-CM | POA: Diagnosis not present

## 2023-12-27 DIAGNOSIS — I517 Cardiomegaly: Secondary | ICD-10-CM | POA: Diagnosis not present

## 2023-12-27 DIAGNOSIS — T82855A Stenosis of coronary artery stent, initial encounter: Secondary | ICD-10-CM | POA: Diagnosis not present

## 2023-12-27 DIAGNOSIS — Z803 Family history of malignant neoplasm of breast: Secondary | ICD-10-CM | POA: Diagnosis not present

## 2023-12-27 DIAGNOSIS — I471 Supraventricular tachycardia, unspecified: Secondary | ICD-10-CM | POA: Diagnosis not present

## 2023-12-27 DIAGNOSIS — J90 Pleural effusion, not elsewhere classified: Secondary | ICD-10-CM | POA: Diagnosis not present

## 2023-12-27 DIAGNOSIS — Z7982 Long term (current) use of aspirin: Secondary | ICD-10-CM | POA: Diagnosis not present

## 2023-12-27 DIAGNOSIS — Z87891 Personal history of nicotine dependence: Secondary | ICD-10-CM | POA: Diagnosis not present

## 2023-12-27 DIAGNOSIS — R2689 Other abnormalities of gait and mobility: Secondary | ICD-10-CM | POA: Diagnosis not present

## 2023-12-27 DIAGNOSIS — D696 Thrombocytopenia, unspecified: Secondary | ICD-10-CM | POA: Diagnosis not present

## 2023-12-27 DIAGNOSIS — Z79899 Other long term (current) drug therapy: Secondary | ICD-10-CM | POA: Diagnosis not present

## 2023-12-27 HISTORY — PX: TEE WITHOUT CARDIOVERSION: SHX5443

## 2023-12-27 HISTORY — PX: CORONARY ARTERY BYPASS GRAFT: SHX141

## 2023-12-27 LAB — POCT I-STAT 7, (LYTES, BLD GAS, ICA,H+H)
Acid-base deficit: 3 mmol/L — ABNORMAL HIGH (ref 0.0–2.0)
Acid-base deficit: 1 mmol/L (ref 0.0–2.0)
Acid-base deficit: 2 mmol/L (ref 0.0–2.0)
Acid-base deficit: 5 mmol/L — ABNORMAL HIGH (ref 0.0–2.0)
Acid-base deficit: 7 mmol/L — ABNORMAL HIGH (ref 0.0–2.0)
Bicarbonate: 22.4 mmol/L (ref 20.0–28.0)
Bicarbonate: 24.7 mmol/L (ref 20.0–28.0)
Bicarbonate: 22.5 mmol/L (ref 20.0–28.0)
Bicarbonate: 19.8 mmol/L — ABNORMAL LOW (ref 20.0–28.0)
Bicarbonate: 17.8 mmol/L — ABNORMAL LOW (ref 20.0–28.0)
Calcium, Ion: 1.24 mmol/L (ref 1.15–1.40)
Calcium, Ion: 0.9 mmol/L — ABNORMAL LOW (ref 1.15–1.40)
Calcium, Ion: 1.26 mmol/L (ref 1.15–1.40)
Calcium, Ion: 1.24 mmol/L (ref 1.15–1.40)
Calcium, Ion: 1.16 mmol/L (ref 1.15–1.40)
HCT: 31 % — ABNORMAL LOW (ref 39.0–52.0)
HCT: 21 % — ABNORMAL LOW (ref 39.0–52.0)
HCT: 25 % — ABNORMAL LOW (ref 39.0–52.0)
HCT: 27 % — ABNORMAL LOW (ref 39.0–52.0)
HCT: 20 % — ABNORMAL LOW (ref 39.0–52.0)
Hemoglobin: 10.5 g/dL — ABNORMAL LOW (ref 13.0–17.0)
Hemoglobin: 7.1 g/dL — ABNORMAL LOW (ref 13.0–17.0)
Hemoglobin: 8.5 g/dL — ABNORMAL LOW (ref 13.0–17.0)
Hemoglobin: 9.2 g/dL — ABNORMAL LOW (ref 13.0–17.0)
Hemoglobin: 6.8 g/dL — CL (ref 13.0–17.0)
O2 Saturation: 100 %
O2 Saturation: 100 %
O2 Saturation: 100 %
O2 Saturation: 98 %
O2 Saturation: 99 %
Patient temperature: 35.7
Patient temperature: 37.1
Potassium: 3.9 mmol/L (ref 3.5–5.1)
Potassium: 4.7 mmol/L (ref 3.5–5.1)
Potassium: 3.9 mmol/L (ref 3.5–5.1)
Potassium: 3.7 mmol/L (ref 3.5–5.1)
Potassium: 3.8 mmol/L (ref 3.5–5.1)
Sodium: 138 mmol/L (ref 135–145)
Sodium: 141 mmol/L (ref 135–145)
Sodium: 140 mmol/L (ref 135–145)
Sodium: 139 mmol/L (ref 135–145)
Sodium: 143 mmol/L (ref 135–145)
TCO2: 24 mmol/L (ref 22–32)
TCO2: 26 mmol/L (ref 22–32)
TCO2: 24 mmol/L (ref 22–32)
TCO2: 21 mmol/L — ABNORMAL LOW (ref 22–32)
TCO2: 19 mmol/L — ABNORMAL LOW (ref 22–32)
pCO2 arterial: 40.9 mmHg (ref 32–48)
pCO2 arterial: 43.4 mmHg (ref 32–48)
pCO2 arterial: 36.9 mmHg (ref 32–48)
pCO2 arterial: 31.9 mmHg — ABNORMAL LOW (ref 32–48)
pCO2 arterial: 32.8 mmHg (ref 32–48)
pH, Arterial: 7.346 — ABNORMAL LOW (ref 7.35–7.45)
pH, Arterial: 7.363 (ref 7.35–7.45)
pH, Arterial: 7.392 (ref 7.35–7.45)
pH, Arterial: 7.395 (ref 7.35–7.45)
pH, Arterial: 7.342 — ABNORMAL LOW (ref 7.35–7.45)
pO2, Arterial: 335 mmHg — ABNORMAL HIGH (ref 83–108)
pO2, Arterial: 507 mmHg — ABNORMAL HIGH (ref 83–108)
pO2, Arterial: 353 mmHg — ABNORMAL HIGH (ref 83–108)
pO2, Arterial: 98 mmHg (ref 83–108)
pO2, Arterial: 142 mmHg — ABNORMAL HIGH (ref 83–108)

## 2023-12-27 LAB — BASIC METABOLIC PANEL WITH GFR
Anion gap: 9 (ref 5–15)
BUN: 22 mg/dL (ref 8–23)
CO2: 23 mmol/L (ref 22–32)
Calcium: 9.2 mg/dL (ref 8.9–10.3)
Chloride: 104 mmol/L (ref 98–111)
Creatinine, Ser: 0.95 mg/dL (ref 0.61–1.24)
GFR, Estimated: >60 mL/min (ref >60)
Glucose, Bld: 283 mg/dL — ABNORMAL HIGH (ref 70–99)
Potassium: 4.2 mmol/L (ref 3.5–5.1)
Sodium: 136 mmol/L (ref 135–145)

## 2023-12-27 LAB — CBC
HCT: 35.5 % — ABNORMAL LOW (ref 39.0–52.0)
HCT: 27.6 % — ABNORMAL LOW (ref 39.0–52.0)
HCT: 24.8 % — ABNORMAL LOW (ref 39.0–52.0)
Hemoglobin: 12.6 g/dL — ABNORMAL LOW (ref 13.0–17.0)
Hemoglobin: 9.6 g/dL — ABNORMAL LOW (ref 13.0–17.0)
Hemoglobin: 8.7 g/dL — ABNORMAL LOW (ref 13.0–17.0)
MCH: 29.9 pg (ref 26.0–34.0)
MCH: 29.8 pg (ref 26.0–34.0)
MCH: 30.3 pg (ref 26.0–34.0)
MCHC: 35.5 g/dL (ref 30.0–36.0)
MCHC: 34.8 g/dL (ref 30.0–36.0)
MCHC: 35.1 g/dL (ref 30.0–36.0)
MCV: 84.1 fL (ref 80.0–100.0)
MCV: 85.7 fL (ref 80.0–100.0)
MCV: 86.4 fL (ref 80.0–100.0)
Platelets: 146 10*3/uL — ABNORMAL LOW (ref 150–400)
Platelets: 106 10*3/uL — ABNORMAL LOW (ref 150–400)
Platelets: 114 10*3/uL — ABNORMAL LOW (ref 150–400)
RBC: 4.22 MIL/uL (ref 4.22–5.81)
RBC: 3.22 MIL/uL — ABNORMAL LOW (ref 4.22–5.81)
RBC: 2.87 MIL/uL — ABNORMAL LOW (ref 4.22–5.81)
RDW: 12.8 % (ref 11.5–15.5)
RDW: 12.7 % (ref 11.5–15.5)
RDW: 12.8 % (ref 11.5–15.5)
WBC: 5.1 10*3/uL (ref 4.0–10.5)
WBC: 8.2 10*3/uL (ref 4.0–10.5)
WBC: 7.7 10*3/uL (ref 4.0–10.5)
nRBC: 0 % (ref 0.0–0.2)
nRBC: 0 % (ref 0.0–0.2)
nRBC: 0 % (ref 0.0–0.2)

## 2023-12-27 LAB — HEMOGLOBIN AND HEMATOCRIT, BLOOD
HCT: 25.3 % — ABNORMAL LOW (ref 39.0–52.0)
Hemoglobin: 9.1 g/dL — ABNORMAL LOW (ref 13.0–17.0)

## 2023-12-27 LAB — POCT I-STAT, CHEM 8
BUN: 20 mg/dL (ref 8–23)
BUN: 17 mg/dL (ref 8–23)
BUN: 20 mg/dL (ref 8–23)
BUN: 18 mg/dL (ref 8–23)
BUN: 17 mg/dL (ref 8–23)
Calcium, Ion: 1.23 mmol/L (ref 1.15–1.40)
Calcium, Ion: 1.04 mmol/L — ABNORMAL LOW (ref 1.15–1.40)
Calcium, Ion: 1.25 mmol/L (ref 1.15–1.40)
Calcium, Ion: 1.08 mmol/L — ABNORMAL LOW (ref 1.15–1.40)
Calcium, Ion: 1.27 mmol/L (ref 1.15–1.40)
Chloride: 105 mmol/L (ref 98–111)
Chloride: 102 mmol/L (ref 98–111)
Chloride: 107 mmol/L (ref 98–111)
Chloride: 105 mmol/L (ref 98–111)
Chloride: 108 mmol/L (ref 98–111)
Creatinine, Ser: 0.8 mg/dL (ref 0.61–1.24)
Creatinine, Ser: 0.7 mg/dL (ref 0.61–1.24)
Creatinine, Ser: 0.9 mg/dL (ref 0.61–1.24)
Creatinine, Ser: 0.7 mg/dL (ref 0.61–1.24)
Creatinine, Ser: 0.8 mg/dL (ref 0.61–1.24)
Glucose, Bld: 144 mg/dL — ABNORMAL HIGH (ref 70–99)
Glucose, Bld: 115 mg/dL — ABNORMAL HIGH (ref 70–99)
Glucose, Bld: 133 mg/dL — ABNORMAL HIGH (ref 70–99)
Glucose, Bld: 132 mg/dL — ABNORMAL HIGH (ref 70–99)
Glucose, Bld: 161 mg/dL — ABNORMAL HIGH (ref 70–99)
HCT: 33 % — ABNORMAL LOW (ref 39.0–52.0)
HCT: 22 % — ABNORMAL LOW (ref 39.0–52.0)
HCT: 30 % — ABNORMAL LOW (ref 39.0–52.0)
HCT: 26 % — ABNORMAL LOW (ref 39.0–52.0)
HCT: 26 % — ABNORMAL LOW (ref 39.0–52.0)
Hemoglobin: 11.2 g/dL — ABNORMAL LOW (ref 13.0–17.0)
Hemoglobin: 10.2 g/dL — ABNORMAL LOW (ref 13.0–17.0)
Hemoglobin: 7.5 g/dL — ABNORMAL LOW (ref 13.0–17.0)
Hemoglobin: 8.8 g/dL — ABNORMAL LOW (ref 13.0–17.0)
Hemoglobin: 8.8 g/dL — ABNORMAL LOW (ref 13.0–17.0)
Potassium: 3.9 mmol/L (ref 3.5–5.1)
Potassium: 3.6 mmol/L (ref 3.5–5.1)
Potassium: 4.1 mmol/L (ref 3.5–5.1)
Potassium: 4.4 mmol/L (ref 3.5–5.1)
Potassium: 3.9 mmol/L (ref 3.5–5.1)
Sodium: 139 mmol/L (ref 135–145)
Sodium: 138 mmol/L (ref 135–145)
Sodium: 141 mmol/L (ref 135–145)
Sodium: 138 mmol/L (ref 135–145)
Sodium: 140 mmol/L (ref 135–145)
TCO2: 24 mmol/L (ref 22–32)
TCO2: 25 mmol/L (ref 22–32)
TCO2: 29 mmol/L (ref 22–32)
TCO2: 25 mmol/L (ref 22–32)
TCO2: 25 mmol/L (ref 22–32)

## 2023-12-27 LAB — SURGICAL PCR SCREEN
MRSA, PCR: NEGATIVE
Staphylococcus aureus: NEGATIVE

## 2023-12-27 LAB — GLUCOSE, CAPILLARY
Glucose-Capillary: 301 mg/dL — ABNORMAL HIGH (ref 70–99)
Glucose-Capillary: 199 mg/dL — ABNORMAL HIGH (ref 70–99)
Glucose-Capillary: 175 mg/dL — ABNORMAL HIGH (ref 70–99)
Glucose-Capillary: 186 mg/dL — ABNORMAL HIGH (ref 70–99)
Glucose-Capillary: 156 mg/dL — ABNORMAL HIGH (ref 70–99)
Glucose-Capillary: 173 mg/dL — ABNORMAL HIGH (ref 70–99)
Glucose-Capillary: 176 mg/dL — ABNORMAL HIGH (ref 70–99)
Glucose-Capillary: 154 mg/dL — ABNORMAL HIGH (ref 70–99)
Glucose-Capillary: 138 mg/dL — ABNORMAL HIGH (ref 70–99)

## 2023-12-27 LAB — PROTIME-INR
INR: 1 (ref 0.8–1.2)
INR: 1.4 — ABNORMAL HIGH (ref 0.8–1.2)
Prothrombin Time: 13.8 s (ref 11.4–15.2)
Prothrombin Time: 17.3 s — ABNORMAL HIGH (ref 11.4–15.2)

## 2023-12-27 LAB — ECHO INTRAOPERATIVE TEE
AV Mean grad: 5 mmHg
AV Peak grad: 8.8 mmHg
Ao pk vel: 1.48 m/s
Height: 71 in
S' Lateral: 3.63 cm
Weight: 2783.09 [oz_av]

## 2023-12-27 LAB — HEPARIN LEVEL (UNFRACTIONATED): Heparin Unfractionated: 0.42 [IU]/mL (ref 0.30–0.70)

## 2023-12-27 LAB — APTT
aPTT: 55 s — ABNORMAL HIGH (ref 24–36)
aPTT: 28 s (ref 24–36)

## 2023-12-27 LAB — PLATELET COUNT: Platelets: 132 10*3/uL — ABNORMAL LOW (ref 150–400)

## 2023-12-27 LAB — PREPARE RBC (CROSSMATCH)

## 2023-12-27 SURGERY — CORONARY ARTERY BYPASS GRAFTING (CABG)
Anesthesia: General | Site: Chest

## 2023-12-27 MED ORDER — FENTANYL CITRATE (PF) 250 MCG/5ML IJ SOLN
INTRAMUSCULAR | Status: AC
  Filled 2023-12-27: qty 5

## 2023-12-27 MED ORDER — ASPIRIN 81 MG PO CHEW
324.0000 mg | CHEWABLE_TABLET | Freq: Every day | ORAL | Status: DC
  Administered 2023-12-30 – 2024-01-03 (×2): 324 mg
  Filled 2023-12-27 (×2): qty 4

## 2023-12-27 MED ORDER — LACTATED RINGERS IV SOLN
INTRAVENOUS | Status: AC
Start: 2023-12-27 — End: 2023-12-28

## 2023-12-27 MED ORDER — FENTANYL CITRATE (PF) 250 MCG/5ML IJ SOLN
INTRAMUSCULAR | Status: AC
Start: 2023-12-27 — End: ?
  Filled 2023-12-27: qty 5

## 2023-12-27 MED ORDER — FENTANYL CITRATE (PF) 250 MCG/5ML IJ SOLN
INTRAMUSCULAR | Status: DC | PRN
Start: 2023-12-27 — End: 2023-12-27
  Administered 2023-12-27: 50 ug via INTRAVENOUS
  Administered 2023-12-27: 500 ug via INTRAVENOUS
  Administered 2023-12-27: 100 ug via INTRAVENOUS
  Administered 2023-12-27: 250 ug via INTRAVENOUS
  Administered 2023-12-27: 100 ug via INTRAVENOUS
  Administered 2023-12-27 (×2): 50 ug via INTRAVENOUS

## 2023-12-27 MED ORDER — CHLORHEXIDINE GLUCONATE 0.12 % MT SOLN
OROMUCOSAL | Status: AC
  Filled 2023-12-27: qty 15

## 2023-12-27 MED ORDER — NICARDIPINE HCL IN NACL 20-0.86 MG/200ML-% IV SOLN
0.0000 mg/h | INTRAVENOUS | Status: DC
  Administered 2023-12-27: 0 mg/h via INTRAVENOUS
  Administered 2023-12-27: 5 mg/h via INTRAVENOUS
  Filled 2023-12-27: qty 200

## 2023-12-27 MED ORDER — METOPROLOL TARTRATE 12.5 MG HALF TABLET
12.5000 mg | ORAL_TABLET | Freq: Two times a day (BID) | ORAL | Status: DC
  Administered 2023-12-28 – 2024-01-02 (×11): 12.5 mg via ORAL
  Filled 2023-12-27 (×12): qty 1

## 2023-12-27 MED ORDER — CHLORHEXIDINE GLUCONATE CLOTH 2 % EX PADS: 6.0000 | MEDICATED_PAD | Freq: Every day | CUTANEOUS | Status: DC

## 2023-12-27 MED ORDER — PROTAMINE SULFATE 10 MG/ML IV SOLN
INTRAVENOUS | Status: DC | PRN
  Administered 2023-12-27: 350 mg via INTRAVENOUS

## 2023-12-27 MED ORDER — ASPIRIN 81 MG PO CHEW
324.0000 mg | CHEWABLE_TABLET | Freq: Once | ORAL | Status: AC
  Administered 2023-12-27: 324 mg via ORAL
  Filled 2023-12-27: qty 4

## 2023-12-27 MED ORDER — PANTOPRAZOLE SODIUM 40 MG PO TBEC
40.0000 mg | DELAYED_RELEASE_TABLET | Freq: Every day | ORAL | Status: DC
  Administered 2023-12-29 – 2024-01-03 (×6): 40 mg via ORAL
  Filled 2023-12-27 (×6): qty 1

## 2023-12-27 MED ORDER — LACTATED RINGERS IV SOLN: INTRAVENOUS | Status: DC

## 2023-12-27 MED ORDER — METOPROLOL TARTRATE 5 MG/5ML IV SOLN: 2.5000 mg | INTRAVENOUS | Status: DC | PRN

## 2023-12-27 MED ORDER — SODIUM CHLORIDE 0.45 % IV SOLN: INTRAVENOUS | Status: AC | PRN

## 2023-12-27 MED ORDER — MORPHINE SULFATE (PF) 2 MG/ML IV SOLN
1.0000 mg | INTRAVENOUS | Status: DC | PRN
  Administered 2023-12-27: 2 mg via INTRAVENOUS
  Administered 2023-12-28 (×4): 4 mg via INTRAVENOUS
  Filled 2023-12-27 (×4): qty 2
  Filled 2023-12-27: qty 1

## 2023-12-27 MED ORDER — ACETAMINOPHEN 160 MG/5ML PO SOLN
1000.0000 mg | Freq: Four times a day (QID) | ORAL | Status: AC
  Filled 2023-12-27: qty 40.6

## 2023-12-27 MED ORDER — METOCLOPRAMIDE HCL 5 MG/ML IJ SOLN
10.0000 mg | Freq: Four times a day (QID) | INTRAMUSCULAR | Status: AC
  Administered 2023-12-27 – 2023-12-29 (×6): 10 mg via INTRAVENOUS
  Filled 2023-12-27 (×6): qty 2

## 2023-12-27 MED ORDER — FENTANYL CITRATE PF 50 MCG/ML IJ SOSY: 50.0000 ug | PREFILLED_SYRINGE | INTRAMUSCULAR | Status: DC | PRN

## 2023-12-27 MED ORDER — DEXMEDETOMIDINE HCL IN NACL 400 MCG/100ML IV SOLN
0.0000 ug/kg/h | INTRAVENOUS | Status: DC
  Administered 2023-12-27: 0.7 ug/kg/h via INTRAVENOUS
  Filled 2023-12-27: qty 100

## 2023-12-27 MED ORDER — CEFAZOLIN SODIUM-DEXTROSE 2-4 GM/100ML-% IV SOLN
2.0000 g | Freq: Three times a day (TID) | INTRAVENOUS | Status: AC
  Administered 2023-12-27 – 2023-12-29 (×6): 2 g via INTRAVENOUS
  Filled 2023-12-27 (×6): qty 100

## 2023-12-27 MED ORDER — INSULIN GLARGINE-YFGN 100 UNIT/ML ~~LOC~~ SOLN
15.0000 [IU] | Freq: Every day | SUBCUTANEOUS | Status: DC
  Filled 2023-12-27: qty 0.15

## 2023-12-27 MED ORDER — CHLORHEXIDINE GLUCONATE CLOTH 2 % EX PADS
6.0000 | MEDICATED_PAD | Freq: Every day | CUTANEOUS | Status: DC
  Administered 2023-12-27 – 2024-01-03 (×6): 6 via TOPICAL

## 2023-12-27 MED ORDER — MIDAZOLAM HCL 2 MG/2ML IJ SOLN
INTRAMUSCULAR | Status: AC
  Administered 2023-12-27: 4 mg via INTRAVENOUS
  Filled 2023-12-27: qty 4

## 2023-12-27 MED ORDER — MIDAZOLAM HCL (PF) 5 MG/ML IJ SOLN
INTRAMUSCULAR | Status: DC | PRN
  Administered 2023-12-27 (×2): 1 mg via INTRAVENOUS

## 2023-12-27 MED ORDER — 0.9 % SODIUM CHLORIDE (POUR BTL) OPTIME
TOPICAL | Status: DC | PRN
  Administered 2023-12-27: 5000 mL

## 2023-12-27 MED ORDER — PROPOFOL 10 MG/ML IV BOLUS
INTRAVENOUS | Status: DC | PRN
  Administered 2023-12-27: 20 mg via INTRAVENOUS

## 2023-12-27 MED ORDER — ONDANSETRON HCL 4 MG/2ML IJ SOLN
4.0000 mg | Freq: Four times a day (QID) | INTRAMUSCULAR | Status: DC | PRN
  Administered 2023-12-28 – 2023-12-30 (×6): 4 mg via INTRAVENOUS
  Filled 2023-12-27 (×6): qty 2

## 2023-12-27 MED ORDER — MIDAZOLAM HCL 2 MG/2ML IJ SOLN
INTRAMUSCULAR | Status: AC
  Filled 2023-12-27: qty 2

## 2023-12-27 MED ORDER — ASPIRIN 325 MG PO TBEC
325.0000 mg | DELAYED_RELEASE_TABLET | Freq: Every day | ORAL | Status: DC
  Administered 2023-12-28 – 2024-01-02 (×5): 325 mg via ORAL
  Filled 2023-12-27 (×6): qty 1

## 2023-12-27 MED ORDER — LACTATED RINGERS IV SOLN: INTRAVENOUS | Status: DC | PRN

## 2023-12-27 MED ORDER — ALBUMIN HUMAN 5 % IV SOLN
250.0000 mL | INTRAVENOUS | Status: DC | PRN
  Administered 2023-12-27 (×4): 12.5 g via INTRAVENOUS
  Filled 2023-12-27 (×2): qty 250

## 2023-12-27 MED ORDER — TRAMADOL HCL 50 MG PO TABS
50.0000 mg | ORAL_TABLET | ORAL | Status: DC | PRN
  Administered 2023-12-28: 50 mg via ORAL
  Filled 2023-12-27: qty 1

## 2023-12-27 MED ORDER — MAGNESIUM SULFATE 4 GM/100ML IV SOLN
4.0000 g | Freq: Once | INTRAVENOUS | Status: AC
  Administered 2023-12-27: 4 g via INTRAVENOUS
  Filled 2023-12-27: qty 100

## 2023-12-27 MED ORDER — EPINEPHRINE HCL 5 MG/250ML IV SOLN IN NS: 0.0000 ug/min | INTRAVENOUS | Status: DC

## 2023-12-27 MED ORDER — SODIUM CHLORIDE 0.9 % IV SOLN: INTRAVENOUS | Status: DC | PRN

## 2023-12-27 MED ORDER — CHLORHEXIDINE GLUCONATE 0.12 % MT SOLN
15.0000 mL | OROMUCOSAL | Status: AC
  Administered 2023-12-27: 15 mL via OROMUCOSAL
  Filled 2023-12-27: qty 15

## 2023-12-27 MED ORDER — SODIUM CHLORIDE 0.9% FLUSH: 10.0000 mL | INTRAVENOUS | Status: DC | PRN

## 2023-12-27 MED ORDER — LIDOCAINE HCL (PF) 2 % IJ SOLN
INTRAMUSCULAR | Status: DC | PRN
  Administered 2023-12-27: 20 mg via INTRADERMAL

## 2023-12-27 MED ORDER — VANCOMYCIN HCL IN DEXTROSE 1-5 GM/200ML-% IV SOLN
1000.0000 mg | Freq: Once | INTRAVENOUS | Status: AC
  Administered 2023-12-28: 1000 mg via INTRAVENOUS
  Filled 2023-12-27: qty 200

## 2023-12-27 MED ORDER — PROPOFOL 10 MG/ML IV BOLUS
INTRAVENOUS | Status: AC
  Filled 2023-12-27: qty 20

## 2023-12-27 MED ORDER — MIDAZOLAM HCL 2 MG/2ML IJ SOLN: 4.0000 mg | Freq: Once | INTRAMUSCULAR | Status: AC

## 2023-12-27 MED ORDER — OXYCODONE HCL 5 MG PO TABS
5.0000 mg | ORAL_TABLET | ORAL | Status: DC | PRN
  Administered 2023-12-28: 5 mg via ORAL
  Filled 2023-12-27: qty 1

## 2023-12-27 MED ORDER — DOCUSATE SODIUM 100 MG PO CAPS
200.0000 mg | ORAL_CAPSULE | Freq: Every day | ORAL | Status: DC
  Administered 2023-12-28 – 2024-01-03 (×7): 200 mg via ORAL
  Filled 2023-12-27 (×7): qty 2

## 2023-12-27 MED ORDER — SODIUM CHLORIDE 0.9 % IV SOLN: INTRAVENOUS | Status: AC

## 2023-12-27 MED ORDER — NITROGLYCERIN IN D5W 200-5 MCG/ML-% IV SOLN: 0.0000 ug/min | INTRAVENOUS | Status: DC

## 2023-12-27 MED ORDER — SODIUM CHLORIDE 0.9% FLUSH
3.0000 mL | Freq: Two times a day (BID) | INTRAVENOUS | Status: DC
  Administered 2023-12-28 (×2): 3 mL via INTRAVENOUS

## 2023-12-27 MED ORDER — HEPARIN SODIUM (PORCINE) 1000 UNIT/ML IJ SOLN
INTRAMUSCULAR | Status: DC | PRN
  Administered 2023-12-27: 32000 [IU] via INTRAVENOUS

## 2023-12-27 MED ORDER — SODIUM CHLORIDE 0.9% FLUSH: 3.0000 mL | INTRAVENOUS | Status: DC | PRN

## 2023-12-27 MED ORDER — ACETAMINOPHEN 160 MG/5ML PO SOLN
650.0000 mg | Freq: Once | ORAL | Status: AC
  Administered 2023-12-27: 650 mg
  Filled 2023-12-27: qty 20.3

## 2023-12-27 MED ORDER — MIDAZOLAM HCL 2 MG/2ML IJ SOLN
2.0000 mg | INTRAMUSCULAR | Status: DC | PRN
  Filled 2023-12-27: qty 2

## 2023-12-27 MED ORDER — DEXTROSE 50 % IV SOLN: 0.0000 mL | INTRAVENOUS | Status: DC | PRN

## 2023-12-27 MED ORDER — ALBUMIN HUMAN 5 % IV SOLN: INTRAVENOUS | Status: DC | PRN

## 2023-12-27 MED ORDER — ROCURONIUM BROMIDE 10 MG/ML (PF) SYRINGE
PREFILLED_SYRINGE | INTRAVENOUS | Status: DC | PRN
  Administered 2023-12-27: 20 mg via INTRAVENOUS
  Administered 2023-12-27: 60 mg via INTRAVENOUS
  Administered 2023-12-27: 40 mg via INTRAVENOUS
  Administered 2023-12-27: 20 mg via INTRAVENOUS
  Administered 2023-12-27: 50 mg via INTRAVENOUS

## 2023-12-27 MED ORDER — PLASMA-LYTE A IV SOLN: INTRAVENOUS | Status: DC | PRN

## 2023-12-27 MED ORDER — SODIUM CHLORIDE 0.9% FLUSH
10.0000 mL | Freq: Two times a day (BID) | INTRAVENOUS | Status: DC
  Administered 2023-12-27 – 2023-12-28 (×3): 10 mL

## 2023-12-27 MED ORDER — PHENYLEPHRINE 80 MCG/ML (10ML) SYRINGE FOR IV PUSH (FOR BLOOD PRESSURE SUPPORT)
PREFILLED_SYRINGE | INTRAVENOUS | Status: DC | PRN
  Administered 2023-12-27: 40 ug via INTRAVENOUS
  Administered 2023-12-27: 80 ug via INTRAVENOUS

## 2023-12-27 MED ORDER — INSULIN REGULAR(HUMAN) IN NACL 100-0.9 UT/100ML-% IV SOLN
INTRAVENOUS | Status: DC
Start: 2023-12-27 — End: 2023-12-31

## 2023-12-27 MED ORDER — POTASSIUM CHLORIDE 10 MEQ/50ML IV SOLN
10.0000 meq | INTRAVENOUS | Status: AC
  Administered 2023-12-27 (×3): 10 meq via INTRAVENOUS

## 2023-12-27 MED ORDER — METOPROLOL TARTRATE 25 MG/10 ML ORAL SUSPENSION: 12.5000 mg | Freq: Two times a day (BID) | ORAL | Status: DC

## 2023-12-27 MED ORDER — BISACODYL 5 MG PO TBEC
10.0000 mg | DELAYED_RELEASE_TABLET | Freq: Every day | ORAL | Status: DC
  Administered 2023-12-28 – 2024-01-03 (×7): 10 mg via ORAL
  Filled 2023-12-27 (×7): qty 2

## 2023-12-27 MED ORDER — SODIUM CHLORIDE 0.9 % IV SOLN: 250.0000 mL | INTRAVENOUS | Status: AC

## 2023-12-27 MED ORDER — LIVING WELL WITH DIABETES BOOK
Freq: Once | Status: AC
  Filled 2023-12-27: qty 1

## 2023-12-27 MED ORDER — ACETAMINOPHEN 500 MG PO TABS
1000.0000 mg | ORAL_TABLET | Freq: Four times a day (QID) | ORAL | Status: AC
  Administered 2023-12-28 – 2024-01-01 (×17): 1000 mg via ORAL
  Filled 2023-12-27 (×21): qty 2

## 2023-12-27 MED ORDER — BISACODYL 10 MG RE SUPP: 10.0000 mg | Freq: Every day | RECTAL | Status: DC

## 2023-12-27 MED ORDER — PHENYLEPHRINE HCL-NACL 20-0.9 MG/250ML-% IV SOLN
0.0000 ug/min | INTRAVENOUS | Status: DC
  Administered 2023-12-27: 10 ug/min via INTRAVENOUS

## 2023-12-27 MED ORDER — CLEVIDIPINE BUTYRATE 0.5 MG/ML IV EMUL
INTRAVENOUS | Status: DC | PRN
Start: 2023-12-27 — End: 2023-12-27
  Administered 2023-12-27: 3 mg/h via INTRAVENOUS

## 2023-12-27 MED ORDER — GELATIN ABSORBABLE MT POWD
OROMUCOSAL | Status: DC | PRN
  Administered 2023-12-27 (×2): 4 mL via TOPICAL

## 2023-12-27 MED ORDER — SODIUM CHLORIDE 0.9% IV SOLUTION: Freq: Once | INTRAVENOUS | Status: DC

## 2023-12-27 MED ORDER — PANTOPRAZOLE SODIUM 40 MG IV SOLR
40.0000 mg | Freq: Every day | INTRAVENOUS | Status: AC
  Administered 2023-12-27 – 2023-12-28 (×2): 40 mg via INTRAVENOUS
  Filled 2023-12-27 (×2): qty 10

## 2023-12-27 MED ORDER — LACTATED RINGERS IV SOLN: INTRAVENOUS | Status: AC

## 2023-12-27 SURGICAL SUPPLY — 70 items
ADAPTER MULTI PERFUSION 15 (ADAPTER) IMPLANT
BAG DECANTER FOR FLEXI CONT (MISCELLANEOUS) ×3 IMPLANT
BLADE CLIPPER SURG (BLADE) ×3 IMPLANT
BLADE STERNUM SYSTEM 6 (BLADE) ×3 IMPLANT
BNDG ELASTIC 4INX 5YD STR LF (GAUZE/BANDAGES/DRESSINGS) IMPLANT
BNDG ELASTIC 4X5.8 VLCR STR LF (GAUZE/BANDAGES/DRESSINGS) ×3 IMPLANT
BNDG ELASTIC 6INX 5YD STR LF (GAUZE/BANDAGES/DRESSINGS) ×3 IMPLANT
BNDG GAUZE DERMACEA FLUFF 4 (GAUZE/BANDAGES/DRESSINGS) ×3 IMPLANT
CANISTER SUCTION 3000ML PPV (SUCTIONS) ×3 IMPLANT
CANNULA AORTIC ROOT 9FR (CANNULA) IMPLANT
CANNULA MC2 2 STG 29/37 NON-V (CANNULA) ×3 IMPLANT
CANNULA NON VENT 20FR 12 (CANNULA) ×3 IMPLANT
CANNULA TRIPLE STAGE 29X29X29 (MISCELLANEOUS) IMPLANT
CATH ROBINSON RED A/P 18FR (CATHETERS) ×6 IMPLANT
CLIP RETRACTION 3.0MM CORONARY (MISCELLANEOUS) IMPLANT
CLIP TI MEDIUM 24 (CLIP) IMPLANT
CLIP TI WIDE RED SMALL 24 (CLIP) IMPLANT
CONNECTOR BLAKE 2:1 CARIO BLK (MISCELLANEOUS) ×3 IMPLANT
CONTAINER PROTECT SURGISLUSH (MISCELLANEOUS) ×6 IMPLANT
DERMABOND ADVANCED .7 DNX12 (GAUZE/BANDAGES/DRESSINGS) IMPLANT
DRAIN CHANNEL 19F RND (DRAIN) ×9 IMPLANT
DRAIN CONNECTOR BLAKE 1:1 (MISCELLANEOUS) IMPLANT
DRAPE INCISE IOBAN 66X45 STRL (DRAPES) IMPLANT
DRAPE SRG 135X102X78XABS (DRAPES) ×3 IMPLANT
DRAPE WARM FLUID 44X44 (DRAPES) ×3 IMPLANT
DRSG AQUACEL AG ADV 3.5X10 (GAUZE/BANDAGES/DRESSINGS) ×3 IMPLANT
ELECTRODE BLDE 4.0 EZ CLN MEGD (MISCELLANEOUS) ×3 IMPLANT
ELECTRODE REM PT RTRN 9FT ADLT (ELECTROSURGICAL) ×6 IMPLANT
FELT TEFLON 1X6 (MISCELLANEOUS) ×6 IMPLANT
GAUZE SPONGE 4X4 12PLY STRL (GAUZE/BANDAGES/DRESSINGS) ×6 IMPLANT
GLOVE BIO SURGEON STRL SZ7 (GLOVE) ×6 IMPLANT
GLOVE BIOGEL M STRL SZ7.5 (GLOVE) ×6 IMPLANT
GOWN STRL REUS W/ TWL LRG LVL3 (GOWN DISPOSABLE) ×12 IMPLANT
GOWN STRL REUS W/ TWL XL LVL3 (GOWN DISPOSABLE) ×6 IMPLANT
HEMOSTAT POWDER SURGIFOAM 1G (HEMOSTASIS) ×6 IMPLANT
INSERT SUTURE HOLDER (MISCELLANEOUS) ×3 IMPLANT
KIT BASIN OR (CUSTOM PROCEDURE TRAY) ×3 IMPLANT
KIT TURNOVER KIT B (KITS) ×3 IMPLANT
KIT VASOVIEW HEMOPRO 2 VH 4000 (KITS) ×3 IMPLANT
LEAD PACING MYOCARDI (MISCELLANEOUS) ×3 IMPLANT
MARKER DISTAL GRAFT W/ HOLDER (MISCELLANEOUS) ×9 IMPLANT
NS IRRIG 1000ML POUR BTL (IV SOLUTION) ×15 IMPLANT
PACK E OPEN HEART (SUTURE) ×3 IMPLANT
PACK OPEN HEART (CUSTOM PROCEDURE TRAY) ×3 IMPLANT
PAD ARMBOARD POSITIONER FOAM (MISCELLANEOUS) ×6 IMPLANT
PAD ELECT DEFIB RADIOL ZOLL (MISCELLANEOUS) ×3 IMPLANT
PENCIL BUTTON HOLSTER BLD 10FT (ELECTRODE) ×3 IMPLANT
POSITIONER HEAD DONUT 9IN (MISCELLANEOUS) ×3 IMPLANT
PUNCH AORTIC ROTATE 4.0MM (MISCELLANEOUS) ×3 IMPLANT
SET MPS 3-ND DEL (MISCELLANEOUS) IMPLANT
SUPPORT HEART JANKE-BARRON (MISCELLANEOUS) ×3 IMPLANT
SUT ETHIBOND X763 2 0 SH 1 (SUTURE) ×6 IMPLANT
SUT MNCRL AB 3-0 PS2 18 (SUTURE) ×6 IMPLANT
SUT MNCRL AB 4-0 PS2 18 (SUTURE) IMPLANT
SUT PDS AB 1 CTX 36 (SUTURE) ×6 IMPLANT
SUT PROLENE 4 0 SH DA (SUTURE) ×3 IMPLANT
SUT PROLENE 5 0 C 1 36 (SUTURE) ×9 IMPLANT
SUT PROLENE 7 0 BV 1 (SUTURE) IMPLANT
SUT PROLENE 7 0 BV1 MDA (SUTURE) ×3 IMPLANT
SUT STEEL 6MS V (SUTURE) ×6 IMPLANT
SUT VIC AB 2-0 CT1 TAPERPNT 27 (SUTURE) IMPLANT
SYSTEM SAHARA CHEST DRAIN ATS (WOUND CARE) ×3 IMPLANT
TOWEL GREEN STERILE (TOWEL DISPOSABLE) ×3 IMPLANT
TOWEL GREEN STERILE FF (TOWEL DISPOSABLE) ×3 IMPLANT
TRAY FOLEY SLVR 16FR TEMP STAT (SET/KITS/TRAYS/PACK) ×3 IMPLANT
TUBE SUCT INTRACARD DLP 20F (MISCELLANEOUS) IMPLANT
TUBE SUCTION CARDIAC 10FR (CANNULA) IMPLANT
TUBING LAP HI FLOW INSUFFLATIO (TUBING) ×3 IMPLANT
UNDERPAD 30X36 HEAVY ABSORB (UNDERPADS AND DIAPERS) ×3 IMPLANT
WATER STERILE IRR 1000ML POUR (IV SOLUTION) ×6 IMPLANT

## 2023-12-27 NOTE — Anesthesia Preprocedure Evaluation (Addendum)
 Anesthesia Evaluation  Patient identified by MRN, date of birth, ID band Patient awake    Reviewed: Allergy & Precautions, NPO status , Patient's Chart, lab work & pertinent test results, reviewed documented beta blocker date and time   History of Anesthesia Complications Negative for: history of anesthetic complications  Airway Mallampati: II  TM Distance: >3 FB Neck ROM: Full    Dental  (+) Poor Dentition, Missing, Chipped, Dental Advisory Given   Pulmonary COPD, former smoker   breath sounds clear to auscultation       Cardiovascular hypertension, Pt. on medications and Pt. on home beta blockers + angina  + CAD (99% LM, 70% LAD, 80% Cx, 55% RCA), + Past MI, + Cardiac Stents, + Peripheral Vascular Disease and +CHF   Rhythm:Regular Rate:Normal  12/2023 ECHO: EF 55-60%, normal LVF, Grade 1 DD, normal RVF, trivial MR   Neuro/Psych  Headaches    GI/Hepatic Neg liver ROS,GERD  Controlled,,  Endo/Other  diabetes, Insulin  Dependent, Oral Hypoglycemic Agents    Renal/GU Renal InsufficiencyRenal disease     Musculoskeletal  (+) Arthritis ,    Abdominal   Peds  Hematology Hb 12.6, plt 146k   Anesthesia Other Findings   Reproductive/Obstetrics                             Anesthesia Physical Anesthesia Plan  ASA: 4  Anesthesia Plan: General   Post-op Pain Management:    Induction: Intravenous  PONV Risk Score and Plan: 2 and Treatment may vary due to age or medical condition  Airway Management Planned: Oral ETT  Additional Equipment: Arterial line, CVP and TEE  Intra-op Plan:   Post-operative Plan: Post-operative intubation/ventilation  Informed Consent: I have reviewed the patients History and Physical, chart, labs and discussed the procedure including the risks, benefits and alternatives for the proposed anesthesia with the patient or authorized representative who has indicated his/her  understanding and acceptance.     Dental advisory given  Plan Discussed with: CRNA and Surgeon  Anesthesia Plan Comments:        Anesthesia Quick Evaluation

## 2023-12-27 NOTE — Anesthesia Postprocedure Evaluation (Signed)
 Anesthesia Post Note  Patient: Scientist, physiological  Procedure(s) Performed: CORONARY ARTERY BYPASS GRAFTING (CABG) TIMES THREE USING LEFT INTERNAL MAMMARY ARTERY AND ENDOSCOPICALLY HARVESTED RIGHT GREATER SAPHENOUS VEIN (Chest) ECHOCARDIOGRAM, TRANSESOPHAGEAL     Patient location during evaluation: SICU Anesthesia Type: General Level of consciousness: sedated and patient remains intubated per anesthesia plan Pain management: pain level controlled Vital Signs Assessment: post-procedure vital signs reviewed and stable Respiratory status: patient remains intubated per anesthesia plan and patient on ventilator - see flowsheet for VS Cardiovascular status: stable (requiring Cleviprex BP control) Postop Assessment: no apparent nausea or vomiting Anesthetic complications: no   No notable events documented.  Last Vitals:  Vitals:   12/27/23 1246 12/27/23 1740  BP:  (!) 131/43  Pulse: (!) 33 80  Resp: 10 19  Temp:    SpO2: 100% 98%    Last Pain:  Vitals:   12/27/23 1004  TempSrc: Oral  PainSc:                  Kinsly Hild,E. Ronesha Heenan

## 2023-12-27 NOTE — Op Note (Signed)
 [address]       [place] [address]             [phone]                                          12/27/2023 Patient:  Rodman Clam Motta Pre-Op Dx: 3V CAD Unstable angina HTN DM HLP   Post-op Dx:  same Procedure: CABG X 3.  LIMA LAD, RSVG PDA (Tgraft) OM   Endoscopic greater saphenous vein harvest on the right   Surgeon and Role:      * Neida Ellegood, Marinell Siad, MD - Primary    * Marya Smack , PA-C - assisting An experienced assistant was required given the complexity of this surgery and the standard of surgical care. The assistant was needed for exposure, dissection, suctioning, retraction of delicate tissues and sutures, instrument exchange and for overall help during this procedure.    Anesthesia  general EBL:  500ml Blood Administration: none Xclamp Time:  53 min Pump Time:   Drains: 19 F blake drain:  R, L, mediastinal  Wires: V Counts: correct   Indications: 73yo male with LM/3V disease. Hx of PCI, presents with anginal symptoms. Preserved biventricular function and no significant valvular disease. Will plan for 3V CABG.  Findings: Good LIMA and vein.  Heavily calcified LAD and PDA.  Intramyocardial OM with calcification.  PDA vein graft was short, T graft to OM  Operative Technique: All invasive lines were placed in pre-op holding.  After the risks, benefits and alternatives were thoroughly discussed, the patient was brought to the operative theatre.  Anesthesia was induced, and the patient was prepped and draped in normal sterile fashion.  An appropriate surgical pause was performed, and pre-operative antibiotics were dosed accordingly.  We began with simultaneous incisions along the right leg for harvesting of the greater saphenous vein and the chest for the sternotomy.  In regards to the sternotomy, this was carried down with bovie cautery, and the sternum was divided with a reciprocating saw.  Meticulous hemostasis was obtained.   The left internal thoracic artery was exposed and harvested in in pedicled fashion.  The patient was systemically heparinized, and the artery was divided distally, and placed in a papaverine sponge.    The sternal elevator was removed, and a retractor was placed.  The pericardium was divided in the midline and fashioned into a cradle with pericardial stitches.   After we confirmed an appropriate ACT, the ascending aorta was cannulated in standard fashion.  The right atrial appendage was used for venous cannulation site.  Cardiopulmonary bypass was initiated, and the heart retractor was placed. The cross clamp was applied, and a dose of anterograde cardioplegia was given with good arrest of the heart.  We moved to the posterior wall of the heart, and found a good target on the PDA.  An arteriotomy was made, and the vein graft was anastomosed to it in an end to side fashion.  Next we exposed the lateral wall, and found a good target on the OM.  An end to side anastomosis with the vein graft was then created.  Finally, we exposed a good target on the LAD, and fashioned an end to side anastomosis between it and the LITA.  We began to re-warm, and a re-animation dose of cardioplegia was given.  The heart  was de-aired, and the cross clamp was removed.  Meticulous hemostasis was obtained.    A partial occludding clamp was then placed on the ascending aorta, and we created an end to side anastomosis between it and the proximal vein graft.  PDA vein graft was jumped off the hood of the OM vein graft.  Rings were placed on the proximal anastomosis.  Hemostasis was obtained, and we separated from cardiopulmonary bypass without event.  The heparin  was reversed with protamine.  Chest tubes and wires were placed, and the sternum was re-approximated with sternal wires.  The soft tissue and skin were re-approximated wth absorbable suture.    The patient tolerated the procedure without any immediate complications, and was  transferred to the ICU in guarded condition.  Temple Sporer Ala Alice

## 2023-12-27 NOTE — Progress Notes (Signed)
 Pt flipped to 40/4 per RWP by RT Jessica at 2225

## 2023-12-27 NOTE — Anesthesia Procedure Notes (Signed)
 Procedure Name: Intubation Date/Time: 12/27/2023 1:03 PM  Performed by: Hebert Littler, CRNAPre-anesthesia Checklist: Patient identified, Emergency Drugs available, Suction available, Patient being monitored and Timeout performed Patient Re-evaluated:Patient Re-evaluated prior to induction Oxygen Delivery Method: Circle system utilized Preoxygenation: Pre-oxygenation with 100% oxygen Induction Type: IV induction Ventilation: Mask ventilation without difficulty and Oral airway inserted - appropriate to patient size Laryngoscope Size: Mac and 3 Grade View: Grade II Tube size: 8.0 mm Number of attempts: 1 Airway Equipment and Method: Patient positioned with wedge pillow and Stylet Placement Confirmation: ETT inserted through vocal cords under direct vision, positive ETCO2, CO2 detector and breath sounds checked- equal and bilateral Secured at: 24 cm Tube secured with: Tape

## 2023-12-27 NOTE — Plan of Care (Signed)
  Problem: Education: Goal: Knowledge of General Education information will improve Description: Including pain rating scale, medication(s)/side effects and non-pharmacologic comfort measures Outcome: Progressing   Problem: Clinical Measurements: Goal: Ability to maintain clinical measurements within normal limits will improve Outcome: Progressing Goal: Cardiovascular complication will be avoided Outcome: Progressing   Problem: Coping: Goal: Level of anxiety will decrease Outcome: Progressing   Problem: Pain Managment: Goal: General experience of comfort will improve and/or be controlled Outcome: Progressing   Problem: Safety: Goal: Ability to remain free from injury will improve Outcome: Progressing   Problem: Education: Goal: Understanding of CV disease, CV risk reduction, and recovery process will improve Outcome: Progressing Goal: Individualized Educational Video(s) Outcome: Progressing   Problem: Activity: Goal: Ability to return to baseline activity level will improve Outcome: Progressing   Problem: Cardiovascular: Goal: Ability to achieve and maintain adequate cardiovascular perfusion will improve Outcome: Progressing Goal: Vascular access site(s) Level 0-1 will be maintained Outcome: Progressing   Problem: Health Behavior/Discharge Planning: Goal: Ability to safely manage health-related needs after discharge will improve Outcome: Progressing

## 2023-12-27 NOTE — Transfer of Care (Signed)
 Immediate Anesthesia Transfer of Care Note  Patient: Scott Donaldson  Procedure(s) Performed: CORONARY ARTERY BYPASS GRAFTING (CABG) TIMES THREE USING LEFT INTERNAL MAMMARY ARTERY AND ENDOSCOPICALLY HARVESTED RIGHT GREATER SAPHENOUS VEIN (Chest) ECHOCARDIOGRAM, TRANSESOPHAGEAL  Patient Location: ICU  Anesthesia Type:General  Level of Consciousness: sedated and Patient remains intubated per anesthesia plan  Airway & Oxygen Therapy: Patient remains intubated per anesthesia plan and Patient placed on Ventilator (see vital sign flow sheet for setting)  Post-op Assessment: Report given to RN and Post -op Vital signs reviewed and stable  Post vital signs: Reviewed and stable  Last Vitals:  Vitals Value Taken Time  BP 102/65 12/27/23 1744  Temp    Pulse 80 12/27/23 1756  Resp 18 12/27/23 1756  SpO2 99 % 12/27/23 1756  Vitals shown include unfiled device data.  Last Pain:  Vitals:   12/27/23 1004  TempSrc: Oral  PainSc:          Complications: No notable events documented.

## 2023-12-27 NOTE — Telephone Encounter (Signed)
 Patient Product/process development scientist completed.    The patient is insured through [facility]. Patient has Medicare and is not eligible for a copay card, but may be able to apply for patient assistance or Medicare RX Payment Plan (Patient Must reach out to their plan, if eligible for payment plan), if available.    Ran test claim for Dexcom G7 Sensor and the current 30 day co-pay is $0.00.  Ran test claim for Freestyle Libre 3 Plus Sensor and the current 30 day co-pay is $0.00.  This test claim was processed through [facility]- copay amounts may vary at other pharmacies due to pharmacy/plan contracts, or as the patient moves through the different stages of their insurance plan.     Morgan Arab, CPHT Pharmacy Technician III Certified Patient Advocate [facility] Patient Advocate Team Direct Number: [phone]  Fax: [phone]

## 2023-12-27 NOTE — Progress Notes (Signed)
 PHARMACY - ANTICOAGULATION CONSULT NOTE  Pharmacy Consult for heparin  Indication: chest pain/ACS  No Known Allergies  Patient Measurements: Height: 5\' 11"  (180.3 cm) Weight: 78.9 kg (174 lb) IBW/kg (Calculated) : 75.3 HEPARIN  DW (KG): 78.9  Vital Signs: Temp: 98.3 F (36.8 C) (06/03 0754) Temp Source: Oral (06/03 0754) BP: 157/85 (06/03 0754) Pulse Rate: 66 (06/03 0754)  Labs: Recent Labs    12/25/23 1155 12/25/23 1245 12/25/23 2334 12/26/23 0446 12/27/23 0730  HGB 13.1  --   --  12.1* 12.6*  HCT 37.2*  --   --  34.9* 35.5*  PLT 167  --   --  146* 146*  APTT  --   --   --   --  55*  LABPROT  --   --   --   --  13.8  INR  --   --   --   --  1.0  HEPARINUNFRC  --   --  0.32 0.30 0.42  CREATININE 1.04  --   --  0.99 0.95  TROPONINIHS 45* 62*  --   --   --     Estimated Creatinine Clearance: 73.8 mL/min (by C-G formula based on SCr of 0.95 mg/dL).   Medical History: Past Medical History:  Diagnosis Date   Abdominal pain 08/04/2016   Abnormal PSA 05/19/2015   Anemia    Arthralgia 11/12/2010   Benign prostatic hyperplasia with urinary hesitancy 06/14/2018   Cataract    Coronary artery disease    nonobstructive   Diabetes (HCC) 03/04/2007   Qualifier: Diagnosis of  By: Washington Hacker MD, Sean A    DIABETES MELLITUS, TYPE II 03/04/2007   Elevated LDL cholesterol level 06/14/2018   Foot ulcer, right (HCC) 04/15/2014   GERD (gastroesophageal reflux disease)    HOH (hard of hearing)    both ears no hearing aides   HYPERCHOLESTEROLEMIA 07/29/2008   HYPERTENSION 03/04/2007   Knee pain, left 05/16/2015   Nonintractable episodic headache 12/01/2016   migraine   SHOULDER PAIN, BILATERAL 01/12/2010   Qualifier: Diagnosis of  By: Washington Hacker MD, Sean A    TUBULOVILLOUS ADENOMA, 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   6/3 AM: Heparin  level remains  therapeutic at 0.42 with heparin  running at 1200 units/hour. CBC stable. No issues with the heparin  infusion noted.Has had some bleeding from a knick received shaving PTA, but only a small amount of blood noted.   Goal of Therapy:  Heparin  level 0.3-0.7 units/ml Monitor platelets by anticoagulation protocol: Yes   Plan:  Continue Heparin  gtt at 1200 units/hour  Daily HL  F/u post- CABG   Chrystie Crass, PharmD Clinical Pharmacist  12/27/2023 8:16 AM

## 2023-12-27 NOTE — Anesthesia Procedure Notes (Signed)
 Central Venous Catheter Insertion Performed by: Jonne Netters, MD Start/End6/09/2023 10:41 AM, 12/27/2023 10:52 AM Preanesthetic checklist: patient identified, IV checked, risks and benefits discussed, surgical consent, monitors and equipment checked, pre-op evaluation, timeout performed and anesthesia consent Position: Trendelenburg Lidocaine  1% used for infiltration and patient sedated Hand hygiene performed , maximum sterile barriers used  and Seldinger technique used Catheter size: 8.5 Fr Total catheter length 10. Central line was placed.Sheath introducer PA Cath depth:50 Procedure performed using ultrasound guided technique. Ultrasound Notes:anatomy identified, needle tip was noted to be adjacent to the nerve/plexus identified, no ultrasound evidence of intravascular and/or intraneural injection and image(s) printed for medical record Attempts: 1 Following insertion, line sutured and dressing applied. Post procedure assessment: blood return through all ports, free fluid flow and no air  Patient tolerated the procedure well with no immediate complications. Additional procedure comments: CVP: Timeout, sterile prep, drape, FBP R neck.  Trendelenburg position.  1% lido local, finder and trocar RIJ 1st pass with US  guidance.  Cordis introducer placed over J wire, 3-lumen inserted through introducer. Biopatch and sterile dressing on.  Patient tolerated well.  VSS.  Fay Hoop, MD.

## 2023-12-27 NOTE — Progress Notes (Addendum)
  Progress Note  Patient Name: Scott Donaldson Date of Encounter: 12/27/2023 [facility] Cardiologist: Janelle Mediate, MD   Interval Summary   Patient pending CT surgery today.  Vital Signs Vitals:   12/26/23 2014 12/26/23 2325 12/27/23 0613 12/27/23 0754  BP:   126/74 (!) 157/85  Pulse:    66  Resp: 17   15  Temp: 98.6 F (37 C) 98.4 F (36.9 C) 98.2 F (36.8 C) 98.3 F (36.8 C)  TempSrc: Oral Oral Oral Oral  SpO2:    98%  Weight:      Height:        Intake/Output Summary (Last 24 hours) at 12/27/2023 0757 Last data filed at 12/27/2023 0629 Gross per 24 hour  Intake 838.92 ml  Output 650 ml  Net 188.92 ml      12/25/2023   11:53 AM 09/15/2023    9:03 PM 10/08/2022    3:04 PM  Last 3 Weights  Weight (lbs) 174 lb 177 lb 0.5 oz 177 lb  Weight (kg) 78.926 kg 80.3 kg 80.287 kg      Telemetry/ECG  Sinus rhythm with frequent PVCs/PACs- Personally Reviewed  Physical Exam  GEN: No acute distress.   Neck: No JVD Cardiac: RRR, no murmurs, rubs, or gallops.  Respiratory: Clear to auscultation bilaterally. GI: Soft, nontender, non-distended  MS: No edema  Assessment & Plan   73 y.o. male with a hx of CAD status post DES to LCx, hypertension, hyperlipidemia, BPH status post TURP, diabetes   Unstable Angina CAD Patient with central chest pain radiating to shoulders prompting ED evaluation. HS troponin 45->62. Patient started on heparin  and planned for LHC. LHC this admission found complex in-stent restenosis of ostial left circumflex lesion extending into the left main. Also with hemodynamically significant RCA disease by FFR. CT surgery has seen patient, will take him for 3V CABG today. Echo with LVEF 55-60%, no focal RWMA.  Continue heparin  Continue ASA 81mg  Continue Atorvastatin  80mg     Hypertension Patient hypertensive this admission, had been without medications for 7-8 months. Losartan  25mg  started (then stopped pending surgery).  Continue Amlodipine  10mg   pending surgery today).   Hyperlipidemia Continue high intensity statin resumed this admission. Started on Fenofibrate this admission.   DM type II Patient with limited access to medications over the last few months, A1c 13.3. Management per primary.     For questions or updates, please contact  [facility] Please consult [email or web address] for contact info under       Signed, Leala Prince, PA-C

## 2023-12-27 NOTE — Procedures (Signed)
 Extubation Procedure Note  Patient Details:   Name: Scott Donaldson DOB: [birth year] MRN: [id number]  Patient extubated to [place].  Patient VSS.  Positive cuff leak.  NIF -35, VC  937 ml.    Evaluation  O2 sats: stable throughout Complications: No apparent complications Patient did tolerate procedure well. Bilateral Breath Sounds: Clear, Diminished   Yes  Arby Beam 12/27/2023, 11:36 PM

## 2023-12-27 NOTE — Progress Notes (Signed)
 Patient ID: Scott Donaldson, male   DOB: March 22, 1951, 73 y.o.   MRN: [id number]   TCTS Evening Rounds:   Hemodynamically stable  CI = 24  Still asleep on vent  Urine output good  CT output low  CBC    Component Value Date/Time   WBC 8.2 12/27/2023 1750   RBC 3.22 (L) 12/27/2023 1750   HGB 9.6 (L) 12/27/2023 1750   HGB 13.6 07/06/2018 1129   HCT 27.6 (L) 12/27/2023 1750   HCT 39.2 07/06/2018 1129   PLT 106 (L) 12/27/2023 1750   PLT 175 07/06/2018 1129   MCV 85.7 12/27/2023 1750   MCV 85 07/06/2018 1129   MCH 29.8 12/27/2023 1750   MCHC 34.8 12/27/2023 1750   RDW 12.7 12/27/2023 1750   RDW 12.8 07/06/2018 1129   LYMPHSABS 2.4 10/22/2021 0100   LYMPHSABS 2.6 07/06/2018 1129   MONOABS 0.4 10/22/2021 0100   EOSABS 0.1 10/22/2021 0100   EOSABS 0.2 07/06/2018 1129   BASOSABS 0.0 10/22/2021 0100   BASOSABS 0.0 07/06/2018 1129     BMET    Component Value Date/Time   NA 140 12/27/2023 1706   NA 136 07/06/2018 1129   K 3.9 12/27/2023 1706   CL 108 12/27/2023 1706   CO2 23 12/27/2023 0730   GLUCOSE 161 (H) 12/27/2023 1706   BUN 17 12/27/2023 1706   BUN 28 (H) 07/06/2018 1129   CREATININE 0.80 12/27/2023 1706   CALCIUM  9.2 12/27/2023 0730   GFRNONAA >60 12/27/2023 0730     A/P:  Stable postop course. Continue current plans

## 2023-12-27 NOTE — Discharge Summary (Addendum)
 [address]       [place] [address]             [phone]    Physician Discharge Summary  Patient ID: Scott Donaldson MRN: [id number] DOB/AGE: 12-01-1950 73 y.o.  Admit date: 12/25/2023 Discharge date: 01/03/2024  Admission Diagnoses:  Patient Active Problem List   Diagnosis Date Noted   S/P CABG x 3 12/27/2023   Coronary artery disease 12/27/2023   Unstable angina (HCC) 12/26/2023   Light-headedness 10/22/2021   Chronic diastolic CHF (congestive heart failure) (HCC) 10/22/2021   NSTEMI (non-ST elevated myocardial infarction) (HCC) 10/21/2021   Right upper quadrant abdominal pain 07/29/2021   Pre-op evaluation 02/09/2021   Elevated uric acid in blood 10/22/2020   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   Iron  deficiency 04/18/2020   B12 deficiency 04/18/2020   Anemia 04/17/2020   Urinary retention 04/17/2020   Balanitis 04/04/2020   Hospital discharge follow-up 04/04/2020   Prostatitis 03/28/2020   Hypotension due to hypovolemia 03/27/2020   Hyperkalemia 03/27/2020   Acute kidney injury (HCC) 03/27/2020   Right knee pain 03/07/2020   Acute idiopathic gout of right knee 02/25/2020   Bilateral carotid bruits 12/05/2019   Type 2 diabetes mellitus with diabetic polyneuropathy, with long-term current use of insulin  (HCC) 09/05/2019   Diabetes mellitus (HCC) 09/05/2019   Cervical radiculopathy 02/12/2019   Constipation 02/12/2019   DOE (dyspnea on exertion) 02/12/2019   Chest pain 02/12/2019   No-show for appointment 11/21/2018   Coronary artery disease involving native heart 07/20/2018   Bronchitis 07/20/2018   Abnormal stress test    Elevated LDL cholesterol level 06/14/2018   Swelling of left foot 08/05/2017   Nonintractable episodic headache 12/01/2016   Cough 08/04/2016   Right lower quadrant abdominal pain 08/04/2016   Abnormal PSA 05/19/2015   OA (osteoarthritis) of knee 05/16/2015   Foot ulcer, right (HCC)  04/15/2014   Loss of vision 04/15/2014   Screening for prostate cancer 11/03/2012   Routine general medical examination at a health care facility 11/03/2012   Arthralgia 11/12/2010   SHOULDER PAIN, BILATERAL 01/12/2010   TUBULOVILLOUS ADENOMA, 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   HYPERCHOLESTEROLEMIA 07/29/2008   CELLULITIS, FOOT, RIGHT 07/02/2008   EDEMA 07/02/2008   Type 2 diabetes mellitus with hyperglycemia, with long-term current use of insulin  (HCC) 03/04/2007   Essential hypertension 03/04/2007     Discharge Diagnoses:  Patient Active Problem List   Diagnosis Date Noted   S/P CABG x 3 12/27/2023   Coronary artery disease 12/27/2023   Unstable angina (HCC) 12/26/2023   Light-headedness 10/22/2021   Chronic diastolic CHF (congestive heart failure) (HCC) 10/22/2021   NSTEMI (non-ST elevated myocardial infarction) (HCC) 10/21/2021   Right upper quadrant abdominal pain 07/29/2021   Pre-op evaluation 02/09/2021   Elevated uric acid in blood 10/22/2020   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   Iron  deficiency 04/18/2020   B12 deficiency 04/18/2020   Anemia 04/17/2020   Urinary retention 04/17/2020   Balanitis 04/04/2020   Hospital discharge follow-up 04/04/2020   Prostatitis 03/28/2020   Hypotension due to hypovolemia 03/27/2020   Hyperkalemia 03/27/2020   Acute kidney injury (HCC) 03/27/2020   Right knee pain 03/07/2020   Acute idiopathic gout of right knee 02/25/2020   Bilateral carotid bruits 12/05/2019   Type 2 diabetes mellitus with diabetic polyneuropathy, with long-term current use of insulin  (HCC) 09/05/2019   Diabetes mellitus (HCC) 09/05/2019   Cervical radiculopathy 02/12/2019   Constipation  02/12/2019   DOE (dyspnea on exertion) 02/12/2019   Chest pain 02/12/2019   No-show for appointment 11/21/2018   Coronary artery disease involving native heart 07/20/2018   Bronchitis 07/20/2018   Abnormal stress test    Elevated LDL cholesterol level 06/14/2018    Swelling of left foot 08/05/2017   Nonintractable episodic headache 12/01/2016   Cough 08/04/2016   Right lower quadrant abdominal pain 08/04/2016   Abnormal PSA 05/19/2015   OA (osteoarthritis) of knee 05/16/2015   Foot ulcer, right (HCC) 04/15/2014   Loss of vision 04/15/2014   Screening for prostate cancer 11/03/2012   Routine general medical examination at a health care facility 11/03/2012   Arthralgia 11/12/2010   SHOULDER PAIN, BILATERAL 01/12/2010   TUBULOVILLOUS ADENOMA, 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   HYPERCHOLESTEROLEMIA 07/29/2008   CELLULITIS, FOOT, RIGHT 07/02/2008   EDEMA 07/02/2008   Type 2 diabetes mellitus with hyperglycemia, with long-term current use of insulin  (HCC) 03/04/2007   Essential hypertension 03/04/2007   Discharged Condition: Stable  History of Present Illness:  Scott Donaldson is a 73 yo male with a past medical history of coronary artery disease, NSTEMI 57' (s/p PCI with DES to left Circumflex), hypertension, hyperlipidemia, DM, BPH (s/p TURP), remote tobacco abuse (quit 1990), peripheral neuropathy who presented to the ED with complaints of chest pain that radiated to his neck and left arm. He also had complaints of shortness of breath, lightheadedness and nausea. EKG showed sinus rhythm, LVH, cannot rule out anterior infarct-age undetermined). Initial Troponin I was 45 and had a max of 62. Cardiac catheterization done 06/02 showed mid left main to distal left main 99% stenosis, proximal LAD with a 70% stenosis, ostial to proximal Circumflex with a 80% stenosis, and a 55%distal RCA stenosis.  It was felt the patient would benefit from coronary bypass grafting and Cardiothoracic surgery consultation was requested.  Hospital Course:   The patient was evaluated by Dr. Deloise Ferries who felt patient would benefit from coronary bypass grafting.  The risks and benefits of the procedure were explained to the patient and he was agreeable to proceed.  The patient remained chest  pain free prior to surgery.  He was taken to the operating room and underwent a CABG x 3. He was transported from the OR to [facility] ICU in stable condition. He was extubated routinely.  Vital signs and hemodynamics remained stable.  He was mobilized in the ICU.  He was ready for transfer to [facility] by postop day 2.  Diet and activity were advanced and well-tolerated.  He had return of appropriate bowel function.  He remained in sinus rhythm with occasional SVT. His metoprolol  was increased to 25mg  BID once his BP gradually improved to the 120's -130's.  His mobility gradually improved so that he was independent with walking and transfers prior to discharge.  His incisions healed with no sign of complication. We arranged for home health PT and OT per the therapists' recommendations prior to discharge.    Consults: pulmonary/intensive care  Significant Diagnostic Studies:  CLINICAL DATA:  Postoperative state   EXAM: CHEST - 2 VIEW   COMPARISON:  Chest x-ray performed December 31, 2023   FINDINGS: Small left pleural effusion with associated consolidation, similar. Modest improvement in central pulmonary vascular congestion. No pneumothorax. Postsurgical changes from sternotomy.   IMPRESSION: 1. Similar appearance of small left pleural effusion with associated consolidation. 2. Modest improvement in central congestion.     Electronically Signed   By: Vola Grow.D.  On: 01/02/2024 08:27   Treatments: surgery:    Discharge Exam: Blood pressure 126/65, pulse 88, temperature 98.7 F (37.1 C), temperature source Oral, resp. rate 20, height 5\' 11"  (1.803 m), weight 82.5 kg, SpO2 92%. General appearance: alert, cooperative, and no distress Neurologic: intact Heart: RRR, NSR on monitor with a occasional SVT.  Lungs: breath sounds clear, shallow. CXR showing left basilar ATX.  Abdomen: soft, no tenderness Extremities: all well perfused, mild LE edema. The RLE incisions have some  mild erythema that appears to be clearing but are dry. Wound: the sternotomy incision is clean and dry.   Discharge Medications:  The patient has been discharged on:   1.Beta Blocker:  Yes [  x ]                              No   [   ]                              If No, reason:  2.Ace Inhibitor/ARB: Yes [   ]                                     No  [  x  ]                                     If No, reason: Soft BP  3.Statin:   Yes [ x  ]                  No  [   ]                  If No, reason:  4.Ecasa:  Yes  [  x ]                  No   [   ]                  If No, reason:  Patient had ACS upon admission: no  Plavix/P2Y12 inhibitor: Yes [   ]                                      No  [  x ]     Discharge Instructions     Amb Referral to Cardiac Rehabilitation   Complete by: As directed    Diagnosis: CABG   CABG X ___: 3   After initial evaluation and assessments completed: Virtual Based Care may be provided alone or in conjunction with Phase 2 Cardiac Rehab based on patient barriers.: Yes   Intensive Cardiac Rehabilitation (ICR) [facility] location only OR Traditional Cardiac Rehabilitation (TCR) *If criteria for ICR are not met will enroll in TCR [facility] only): Yes      Allergies as of 01/03/2024   No Known Allergies      Medication List     STOP taking these medications    amLODipine  5 MG tablet Commonly known as: NORVASC    losartan  50 MG tablet Commonly known as: COZAAR    metoprolol  succinate 25 MG 24 hr tablet Commonly known as: TOPROL -XL  SOS-AMI selatogrel or placebo 16 mg/0.5 mL SQ injection       TAKE these medications    Accu-Chek Guide Me w/Device Kit Check blood sugars 2 times daily   Accu-Chek Softclix Lancets lancets Check blood sugar 2 times daily   aspirin  EC 325 MG tablet Take 1 tablet (325 mg total) by mouth daily. What changed:  medication strength how much to take additional instructions   atorvastatin  80 MG  tablet Commonly known as: LIPITOR  Take 1 tablet (80 mg total) by mouth daily.   fenofibrate  160 MG tablet Take 1 tablet (160 mg total) by mouth daily.   furosemide 40 MG tablet Commonly known as: LASIX Take 1 tablet (40 mg total) by mouth daily for 7 days.   glucose blood test strip 1 each by Other route 2 (two) times daily. And lancets 2/day   Accu-Chek Guide test strip Generic drug: glucose blood Check blood sugars 2 times daily   Iron  (Ferrous Sulfate ) 325 (65 Fe) MG Tabs Take one daily   Lantus  SoloStar 100 UNIT/ML Solostar Pen Generic drug: insulin  glargine Inject 30 Units into the skin at bedtime.   Lyumjev  KwikPen 100 UNIT/ML KwikPen Generic drug: Insulin  Lispro-aabc Inject 8-12 Units into the skin 3 (three) times daily before meals.   metFORMIN  500 MG 24 hr tablet Commonly known as: GLUCOPHAGE -XR Take 1 tablet (500 mg total) by mouth daily with breakfast.   metoprolol  tartrate 25 MG tablet Commonly known as: LOPRESSOR  Take 1 tablet (25 mg total) by mouth 2 (two) times daily.   nitroGLYCERIN  0.4 MG SL tablet Commonly known as: NITROSTAT  Place 1 tablet (0.4 mg total) under the tongue every 5 (five) minutes as needed for chest pain.   potassium chloride  SA 20 MEQ tablet Commonly known as: KLOR-CON  M Take 1 tablet (20 mEq total) by mouth daily.   traMADol  50 MG tablet Commonly known as: ULTRAM  Take 1 tablet (50 mg total) by mouth every 4 (four) hours as needed for up to 7 days for moderate pain (pain score 4-6).               Durable Medical Equipment  (From admission, onward)           Start     Ordered   01/03/24 0922  For home use only DME 4 wheeled rolling walker with seat  Once       Question:  Patient needs a walker to treat with the following condition  Answer:  Imbalance   01/03/24 0921   01/01/24 0817  For home use only DME Shower stool  Once        01/01/24 4098            Follow-up Information     Virgina Grills, FNP. Go  on 01/05/2024.   Specialty: Family Medicine Why: TIME : 10:40 AM   PLEASE ARRIVE AT 10:25 AM DATE : JUNE 12 , 2025  PLEASE BRING ALL MEDICATION including over the counter and supplements , photo ID and INS CARD, and previous provider contact info Contact information: [address] [place] [address] [phone]         Hilarie Lovely, MD. Call on 01/05/2024.   Specialty: Cardiothoracic Surgery Why: Appointment time is at 2:20pm.  This is a telephone visit. Do not go to the office. Contact information: [address] [place] [address] [phone]         Lamond Pilot, PA. Go on 01/10/2024.   Specialties:  Cardiology, Radiology Why: Your appointment is at 8:25am. Contact information: [address] [place] [address] [phone]                 Signed:  Leata Providence, PA-C 01/03/2024, 10:27 AM

## 2023-12-27 NOTE — Progress Notes (Signed)
     [address]       [place] [address]             [phone]       No events  Vitals:   12/27/23 0613 12/27/23 0754  BP: 126/74 (!) 157/85  Pulse:  66  Resp:  15  Temp: 98.2 F (36.8 C) 98.3 F (36.8 C)  SpO2:  98%   Alert NAD Sinus EWOB  OR today for CABG  Jordynn Perrier O Rylynne Schicker

## 2023-12-27 NOTE — Consult Note (Cosign Needed Addendum)
 NAME:  Scott Donaldson, MRN:  [id number], DOB:  1950-12-09, LOS: 0 ADMISSION DATE:  12/25/2023, CONSULTATION DATE:  6/3 REFERRING MD:  Dr. Deloise Ferries, CHIEF COMPLAINT:  CABG x 3   History of Present Illness:  Patient is a 73 yo M w/ pertinent PMH CAD and NSTEMI 2023 s/p PCI and stent placement, HTN, HLD, DMT2 presents to [facility] on 6/1 chest pain.  Patient presents to [facility] on 6/1 w/ acute chest pain. EKG showing LBBB, LVH. Minimal troponin 45 to 62. Cardiology consulted placed on heparin . On 6/2 heart cath showing severe left main, ostial lcx, rca disease. On DAPT and high intesnity statin. TCTS consulted. CABG x 3 (LIMA to LAD, SVG to OM, SVG to PDA) on 6/3. Post op intubated/sedated brought to 2H icu. PCCM consulted.  EBL:  Blood Administration: none Xclamp Time:  53 min Pump Time:    Pertinent  Medical History   Past Medical History:  Diagnosis Date   Abdominal pain 08/04/2016   Abnormal PSA 05/19/2015   Anemia    Arthralgia 11/12/2010   Benign prostatic hyperplasia with urinary hesitancy 06/14/2018   Cataract    Coronary artery disease    nonobstructive   Diabetes (HCC) 03/04/2007   Qualifier: Diagnosis of  By: [place] Hacker MD, Sean A    DIABETES MELLITUS, TYPE II 03/04/2007   Elevated LDL cholesterol level 06/14/2018   Foot ulcer, right (HCC) 04/15/2014   GERD (gastroesophageal reflux disease)    HOH (hard of hearing)    both ears no hearing aides   HYPERCHOLESTEROLEMIA 07/29/2008   HYPERTENSION 03/04/2007   Knee pain, left 05/16/2015   Nonintractable episodic headache 12/01/2016   migraine   SHOULDER PAIN, BILATERAL 01/12/2010   Qualifier: Diagnosis of  By: [place] Hacker MD, Sean A    TUBULOVILLOUS ADENOMA, 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 rate 18, height 5\' 11"  (1.803 m), weight 78.9 kg, SpO2 97%.        Intake/Output Summary (Last 24 hours) at 12/27/2023 1113 Last data filed at 12/27/2023 6213 Gross per 24 hour  Intake 595.92 ml  Output 650 ml  Net -54.08 ml   Filed Weights   12/25/23 1153 12/27/23 1004  Weight: 78.9 kg 78.9 kg    Examination: General: critically ill appearing on mech vent HEENT: MM pink/moist; ETT in place, PERRLA intact  Neuro: sedated, moves to stimuli, + cough/gag reflex  CV: s1s2, paced rate 80, CT in place PULM:  dim/rub, BS bilaterally; on mech vent simv GI: soft, bsx4 active  Extremities: warm/dry, no edema  Skin: no rashes or lesions   Resolved Hospital Problem list     Assessment & Plan:  Postoperative vent management Plan: - CXR to confirm ETT and CT position - Continue full vent support (4-8cc/kg IBW) - Wean FiO2 for O2 sat > 90% - Daily WUA/SBT, rapid wean with SIMV per protocol - VAP bundle - Pulmonary hygiene - F/u ABG - PAD protocol for sedation: Precedex for goal RASS 0 to -1  Multivessel CAD s/p CABG x 3  -(LIMA to LAD, SVG to OM, SVG to PDA) NSTEMI 2023  HTN HLD Plan: - Monitor CI, CO - cont neo for map >65 - Continue to utilize cleviprex for HTN - Albumin boluses prn for low SV. - Continue ASA and statin - wean insulin  gtt per protocol - Postoperative care per TCTS - CT management per protocol - pain management: morphine , oxycodone, and tramadol    DMT2 Plan: -insulin  gtt post op -cbg monitoring  Expected post-operative ABLA Expected post-operative consumptive thrombocytopenia  Plan:  -Continue to monitor for bleeding per CT output -Continue to monitor h/h and platelets  -Transfuse for hgb < 7   Best Practice (right click and "Reselect all  SmartList Selections" daily)   Diet/type: NPO w/ meds via tube DVT prophylaxis: SCD GI prophylaxis: PPI Lines: Central line and Arterial Line Foley:  Yes, and it is still needed Code Status:  full code Last date of multidisciplinary goals of care discussion [per primary]  Labs   CBC: Recent Labs  Lab 12/25/23 1155 12/26/23 0446 12/27/23 0730  WBC 5.6 5.7 5.1  HGB 13.1 12.1* 12.6*  HCT 37.2* 34.9* 35.5*  MCV 85.9 86.0 84.1  PLT 167 146* 146*    Basic Metabolic Panel: Recent Labs  Lab 12/25/23 1155 12/26/23 0446 12/27/23 0730  NA 133* 136 136  K 4.5 3.7 4.2  CL 100 104 104  CO2 21* 24 23  GLUCOSE 578* 353* 283*  BUN 27* 24* 22  CREATININE 1.04 0.99 0.95  CALCIUM  9.4 8.7* 9.2  MG  --  1.8  --    GFR: Estimated Creatinine Clearance: 73.8 mL/min (by C-G formula based on SCr of 0.95 mg/dL). Recent Labs  Lab 12/25/23 1155 12/26/23 0446 12/27/23 0730  WBC 5.6 5.7 5.1    Liver Function Tests: No results for input(s): "AST", "ALT", "ALKPHOS", "BILITOT", "PROT", "ALBUMIN" in the last 168 hours. No results for input(s): "LIPASE", "AMYLASE" in the last 168 hours. No results for input(s): "AMMONIA" in the last 168 hours.  ABG    Component Value Date/Time   PHART 7.45 12/26/2023 2022   PCO2ART 38 12/26/2023 2022   PO2ART 81 (L) 12/26/2023 2022   HCO3 26.4 12/26/2023 2022   O2SAT 98.4 12/26/2023 2022     Coagulation Profile: Recent Labs  Lab 12/27/23 0730  INR 1.0    Cardiac Enzymes: No results for input(s): "CKTOTAL", "CKMB", "CKMBINDEX", "TROPONINI" in the last 168 hours.  HbA1C: Hemoglobin A1C  Date/Time Value Ref Range Status  10/08/2022 03:12 PM 10.4 (A) 4.0 - 5.6 % Final  03/25/2022 04:33 PM 10.3 (A) 4.0 - 5.6 % Final   Hgb A1c MFr Bld  Date/Time Value Ref Range Status  12/25/2023 11:56 AM 13.3 (H) 4.8 - 5.6 % Final    Comment:    (NOTE) Diagnosis of Diabetes The following HbA1c ranges recommended by the American Diabetes Association (ADA)  may be used as an aid in the diagnosis of diabetes mellitus.  Hemoglobin             Suggested A1C NGSP%              Diagnosis  <5.7                   Non Diabetic  5.7-6.4                Pre-Diabetic  >6.4  Diabetic  <7.0                   Glycemic control for                       adults with diabetes.    10/22/2021 12:56 AM 10.4 (H) 4.8 - 5.6 % Final    Comment:    (NOTE) Pre diabetes:          5.7%-6.4%  Diabetes:              >6.4%  Glycemic control for   <7.0% adults with diabetes     CBG: Recent Labs  Lab 12/26/23 1207 12/26/23 1733 12/26/23 2202 12/27/23 0757 12/27/23 1040  GLUCAP 285* 291* 330* 301* 199*    Review of Systems:   Patient is intubated; therefore, history has been obtained from chart review.    Past Medical History:  He,  has a past medical history of Abdominal pain (08/04/2016), Abnormal PSA (05/19/2015), Anemia, Arthralgia (11/12/2010), Benign prostatic hyperplasia with urinary hesitancy (06/14/2018), Cataract, Coronary artery disease, Diabetes (HCC) (03/04/2007), DIABETES MELLITUS, TYPE II (03/04/2007), Elevated LDL cholesterol level (06/14/2018), Foot ulcer, right (HCC) (04/15/2014), GERD (gastroesophageal reflux disease), HOH (hard of hearing), HYPERCHOLESTEROLEMIA (07/29/2008), HYPERTENSION (03/04/2007), Knee pain, left (05/16/2015), Nonintractable episodic headache (12/01/2016), SHOULDER PAIN, BILATERAL (01/12/2010), and TUBULOVILLOUS ADENOMA, COLON (07/29/2008).   Surgical History:   Past Surgical History:  Procedure Laterality Date   CARDIAC CATHETERIZATION     COLONOSCOPY  07/2017   CORONARY PRESSURE/FFR WITH 3D MAPPING N/A 12/26/2023   Procedure: Coronary Pressure/FFR w/3D Mapping;  Surgeon: Kyra Phy, MD;  Location: [facility];  Service: Cardiovascular;  Laterality: N/A;   CORONARY STENT INTERVENTION N/A 10/22/2021   Procedure: CORONARY STENT INTERVENTION;  Surgeon: Kyra Phy, MD;  Location: [facility]  [facility];  Service: Cardiovascular;  Laterality: N/A;   LEFT HEART CATH AND CORONARY ANGIOGRAPHY N/A 07/12/2018   Procedure: LEFT HEART CATH AND CORONARY ANGIOGRAPHY;  Surgeon: Wenona Hamilton, MD;  Location: [facility];  Service: Cardiovascular;  Laterality: N/A;   LEFT HEART CATH AND CORONARY ANGIOGRAPHY N/A 10/22/2021   Procedure: LEFT HEART CATH AND CORONARY ANGIOGRAPHY;  Surgeon: Kyra Phy, MD;  Location: [facility];  Service: Cardiovascular;  Laterality: N/A;   LEFT HEART CATH AND CORONARY ANGIOGRAPHY N/A 12/26/2023   Procedure: LEFT HEART CATH AND CORONARY ANGIOGRAPHY;  Surgeon: Kyra Phy, MD;  Location: [facility];  Service: Cardiovascular;  Laterality: N/A;   stress cardiolite  05/30/2003   TRANSURETHRAL RESECTION OF PROSTATE N/A 06/04/2020   Procedure: TRANSURETHRAL RESECTION OF THE PROSTATE (TURP), BIPOLAR;  Surgeon: Adelbert Homans, MD;  Location: [facility];  Service: Urology;  Laterality: N/A;     Social History:   reports that he quit smoking about 35 years ago. His smoking use included cigarettes. He started smoking about 60 years ago. He has a 37.5 pack-year smoking history. He has never used smokeless tobacco. He reports that he does not drink alcohol and does not use drugs.   Family History:  His family history includes Cancer in his father and mother; Cancer (age of onset: 21) in his brother; Diabetes in his father. There is no history of Colon cancer, Rectal cancer, or Stomach cancer.   Allergies No Known Allergies   Home Medications  Prior to Admission medications   Medication Sig Start Date End Date Taking? Authorizing Provider  nitroGLYCERIN  (  NITROSTAT ) 0.4 MG SL tablet Place 1 tablet (0.4 mg total) under the tongue every 5 (five) minutes as needed for chest pain. 11/03/21 12/26/23 Yes Clearnce Curia, NP  Accu-Chek Softclix Lancets lancets Check blood sugar 2 times daily 01/20/22   Emilie Harden, MD   amLODipine  (NORVASC ) 5 MG tablet TAKE 1 TABLET(5 MG) BY MOUTH DAILY Patient not taking: Reported on 12/26/2023 04/06/23   Tonna Frederic, MD  aspirin  EC 81 MG tablet Take 81 mg by mouth daily. Swallow whole. Patient not taking: Reported on 12/26/2023    [provider]  atorvastatin  (LIPITOR) 80 MG tablet Take 1 tablet (80 mg total) by mouth daily. Patient not taking: Reported on 12/26/2023 04/06/23 12/26/23  Tonna Frederic, MD  Blood Glucose Monitoring Suppl (ACCU-CHEK GUIDE ME) w/Device KIT Check blood sugars 2 times daily 01/20/22   Emilie Harden, MD  glucose blood (ACCU-CHEK GUIDE) test strip Check blood sugars 2 times daily 01/20/22   Emilie Harden, MD  glucose blood test strip 1 each by Other route 2 (two) times daily. And lancets 2/day 08/21/21   Gwyndolyn Lerner, MD  insulin  glargine (LANTUS  SOLOSTAR) 100 UNIT/ML Solostar Pen Inject 30 Units into the skin at bedtime. Patient not taking: Reported on 12/26/2023 03/30/23   Emilie Harden, MD  Insulin  Lispro-aabc (LYUMJEV  KWIKPEN) 100 UNIT/ML KwikPen Inject 8-12 Units into the skin 3 (three) times daily before meals. Patient not taking: Reported on 12/26/2023 03/30/23   Emilie Harden, MD  Iron , Ferrous Sulfate , 325 (65 Fe) MG TABS Take one daily Patient not taking: Reported on 12/26/2023 04/18/20   Tonna Frederic, MD  losartan  (COZAAR ) 50 MG tablet Take 1 tablet (50 mg total) by mouth daily. Patient not taking: Reported on 12/26/2023 04/06/23 12/26/23  Tonna Frederic, MD  metFORMIN  (GLUCOPHAGE -XR) 500 MG 24 hr tablet Take 1 tablet (500 mg total) by mouth daily with breakfast. Patient not taking: Reported on 12/26/2023 10/08/22   Emilie Harden, MD  metoprolol  succinate (TOPROL -XL) 25 MG 24 hr tablet Take 0.5 tablets (12.5 mg total) by mouth daily for 30 doses. Patient not taking: Reported on 12/26/2023 04/06/23 12/26/23  Tonna Frederic, MD  Study - SOS-AMI - selatogrel 16 mg/0.5 mL or placebo SQ injection  (PI-Christopher) Inject 0.5 mLs (16 mg total) into the skin once for 1 dose. For Investigational Use Only. Inject 0.5 mL subcutaneously into the abdomen or thigh if experiencing symptoms of a heart attack. Call 911 immediately and seek emergency medical support. Patient not taking: Reported on 12/26/2023 06/01/22 12/26/23  Sheryle Donning, MD     Critical care time: 45 minutes    Scott Donaldson AGACNP-BC   [facility] 12/27/2023, 6:00 PM  Please see [email or web address] for pager details.  From 7A-7P if no response, please call [phone]. After hours, please call ELink [phone].

## 2023-12-27 NOTE — Brief Op Note (Signed)
 12/25/2023 - 12/27/2023  4:03 PM  PATIENT:  Scott Donaldson  73 y.o. male  PRE-OPERATIVE DIAGNOSIS:  Coronary artery diesease  POST-OPERATIVE DIAGNOSIS:  Coronary artery diesease  PROCEDURE:  TRANSESOPHAGEAL ECHOCARDIOGRAM, CORONARY ARTERY BYPASS GRAFTING (CABG) TIMES THREE (LIMA to LAD, SVG to OM, SVG to PDA) USING LEFT INTERNAL MAMMARY ARTERY AND ENDOSCOPICALLY HARVESTED RIGHT GREATER SAPHENOUS VEIN   Vein harvest time: 30 min Vein prep time: 15 min  SURGEON:  Surgeons and Role:    Hilarie Lovely, MD - Primary  PHYSICIAN ASSISTANT: Moira Andrews PA-C  ASSISTANTS: Nova Began RNFA   ANESTHESIA:   general  EBL:  Per anesthesia and perfusion record  DRAINS: Chest tubes placed in the mediastinal and pleural spaces   COUNTS CORRECT:  YES  DICTATION: .Dragon Dictation  PLAN OF CARE: Admit to inpatient   PATIENT DISPOSITION:  ICU - intubated and hemodynamically stable.   Delay start of Pharmacological VTE agent (>24hrs) due to surgical blood loss or risk of bleeding: yes  BASELINE WEIGHT: 78.9 kg

## 2023-12-27 NOTE — Progress Notes (Signed)
 Subjective:  Patient Summary: Scott Donaldson is a 73 y.o. male with a pertinent past medical history of NSTEMI s/p PCI and stent placement (2023), HTN, diabetes, and HLD, who presented with chest pain and was admitted for unstable angina. He is now s/p LHC (6/2) with complex in-stent restenosis of Lcx with involvement of L main and RCA disease.   Overnight Events/Interim History: No acute events overnight. Overall feeling well this morning without any continued chest pain or shortness of breath. He has not required any PRN nitroglycerin . Denies nausea or vomiting, states that he is eating at his baseline. He is feeling ready for his CABG today and states that the CT surgery team answered his questions. He remains surrounded by family and friends at bedside.   Significant Hospital Events:  12/26/23 - LHC LM 99% stenosed, Ost Cx 80% stenosed, RCA disease 12/27/23 - To undergo 3V CABG  Objective:  Vital signs in last 24 hours: Vitals:   12/27/23 1243 12/27/23 1244 12/27/23 1245 12/27/23 1246  BP:      Pulse: (!) 42 (!) 40 (!) 33 (!) 33  Resp: 10 11 10 10   Temp:      TempSrc:      SpO2: 100% 100% 100% 100%  Weight:      Height:       Weight change:   Intake/Output Summary (Last 24 hours) at 12/27/2023 1330 Last data filed at 12/27/2023 1310 Gross per 24 hour  Intake 895.92 ml  Output 1050 ml  Net -154.08 ml   Physical Exam -  General: Well-appearing, sitting up in hospital bed in NAD HEENT: Normocephalic, atraumatic. MMM.  CV: Regular rate, regular rhythm. Normal S1/S2. No murmurs, rubs, or gallops. No peripheral edema.  Pulm: Lungs clear to auscultation bilaterally. No increased WOB on room air.  MSK: Moving all extremities equally. No tenderness or edema.  Skin: Warm and dry. No rashes or lesions.  Neuro: Alert and oriented. No gross neurologic deficits.   Assessment/Plan:  Principal Problem:   Chest pain Active Problems:   Unstable angina (HCC)  Scott Donaldson is a  73 y.o. male with a pertinent past medical history of NSTEMI s/p PCI and stent placement (2023), HTN, diabetes, and HLD, who presented with chest pain and was admitted for unstable angina. He is now s/p LHC (6/2) with complex in-stent restenosis of Lcx with involvement of L main and RCA disease.   Multivessel CAD  Unstable Angina  History of NSTEMI s/p circumflex stent (2023) Left heart cath performed 6/2 demonstrated in-stent restenosis of ostial L Cx with extension to L main, as well as RCA disease. CT surgery evaluated patient yesterday and consented for 3V CABG scheduled for this morning. Echo was completed and demonstrated EF 55-60% with normal function. Patient remains hemodynamically stable without further episodes of chest pain. Plan for transfer to CVICU following 3V CABG today.  - Cardiology and CT surgery following, appreciate recommendations  - ASA 81 mg HELD for surgery   - Atorvastatin  80 mg HELD for surgery  - Continue heparin  drip  - Nitroglycerin , Tylenol  HELD for surgery  - NPO for surgery today  Hyperglycemia  Poorly controlled Type 2 diabetes A1c ~13. CBG 330 last night, now 301 this AM. Given CABG planned for this morning, will continue Semglee  15 units daily and optimize blood glucoses intraoperatively with insulin  infusion. Anticipate close coordination and follow-up with endocrinology to adjust insulin  regimen prior to discharge. Diabetes coordinator spoke with patient yesterday about calling his  endocrinologist to try to make an appointment.  - Diabetes coordinator following, appreciate assistance  - Continue Semglee  15 units daily  - SSI  Hypertension  Blood pressures intermittently elevated since admission (max 172/76). Patient remains asymptomatic without headache or dizziness.  - Losartan , amlodipine  HELD for surgery   Hyperlipidemia  - Atorvastatin  80 mg, fenofibrate 160 mg HELD for surgery   SDOH  Dispo Planning  Patient previously reported cost as a  barrier to obtaining medications and following up with PCP. TOC spoke with patient and family yesterday regarding resources, continue to appreciate assistance.   Diet: NPO for surgery this AM IVF: None VTE: Heparin  drip  Code: Full AB: None Therapy Recs: n/a DISPO: Anticipated discharge TBD to Home vs SNF pending CABG   LOS: 0 days   Leartis Proud, Medical Student 12/27/2023, 1:30 PM

## 2023-12-27 NOTE — Anesthesia Procedure Notes (Signed)
 Arterial Line Insertion Start/End6/09/2023 1:04 PM, 12/27/2023 1:04 PM Performed by: Jonne Netters, MD, Hebert Littler, CRNA, CRNA  Patient location: Pre-op. Preanesthetic checklist: patient identified, IV checked, site marked, risks and benefits discussed, surgical consent, monitors and equipment checked, pre-op evaluation and timeout performed Lidocaine  1% used for infiltration and patient sedated Left, radial was placed Catheter size: 20 G Hand hygiene performed , maximum sterile barriers used  and Seldinger technique used  Attempts: 1 Procedure performed using ultrasound guided technique. Ultrasound Notes:anatomy identified, needle tip was noted to be adjacent to the nerve/plexus identified and no ultrasound evidence of intravascular and/or intraneural injection Following insertion, Biopatch and dressing applied. Patient tolerated the procedure well with no immediate complications.

## 2023-12-28 ENCOUNTER — Inpatient Hospital Stay (HOSPITAL_COMMUNITY)

## 2023-12-28 ENCOUNTER — Encounter (HOSPITAL_COMMUNITY): Payer: Self-pay | Admitting: Thoracic Surgery (Cardiothoracic Vascular Surgery)

## 2023-12-28 ENCOUNTER — Encounter (HOSPITAL_COMMUNITY): Payer: Self-pay | Admitting: *Deleted

## 2023-12-28 DIAGNOSIS — Z951 Presence of aortocoronary bypass graft: Secondary | ICD-10-CM | POA: Diagnosis not present

## 2023-12-28 LAB — GLUCOSE, CAPILLARY
Glucose-Capillary: 118 mg/dL — ABNORMAL HIGH (ref 70–99)
Glucose-Capillary: 118 mg/dL — ABNORMAL HIGH (ref 70–99)
Glucose-Capillary: 120 mg/dL — ABNORMAL HIGH (ref 70–99)
Glucose-Capillary: 122 mg/dL — ABNORMAL HIGH (ref 70–99)
Glucose-Capillary: 132 mg/dL — ABNORMAL HIGH (ref 70–99)
Glucose-Capillary: 139 mg/dL — ABNORMAL HIGH (ref 70–99)
Glucose-Capillary: 140 mg/dL — ABNORMAL HIGH (ref 70–99)
Glucose-Capillary: 188 mg/dL — ABNORMAL HIGH (ref 70–99)
Glucose-Capillary: 96 mg/dL (ref 70–99)
Glucose-Capillary: 98 mg/dL (ref 70–99)
Glucose-Capillary: 99 mg/dL (ref 70–99)

## 2023-12-28 LAB — CBC
HCT: 24.9 % — ABNORMAL LOW (ref 39.0–52.0)
HCT: 26.3 % — ABNORMAL LOW (ref 39.0–52.0)
Hemoglobin: 8.5 g/dL — ABNORMAL LOW (ref 13.0–17.0)
Hemoglobin: 8.8 g/dL — ABNORMAL LOW (ref 13.0–17.0)
MCH: 29.5 pg (ref 26.0–34.0)
MCH: 30 pg (ref 26.0–34.0)
MCHC: 34.1 g/dL (ref 30.0–36.0)
MCHC: 33.5 g/dL (ref 30.0–36.0)
MCV: 86.5 fL (ref 80.0–100.0)
MCV: 89.8 fL (ref 80.0–100.0)
Platelets: 110 10*3/uL — ABNORMAL LOW (ref 150–400)
Platelets: 118 10*3/uL — ABNORMAL LOW (ref 150–400)
RBC: 2.88 MIL/uL — ABNORMAL LOW (ref 4.22–5.81)
RBC: 2.93 MIL/uL — ABNORMAL LOW (ref 4.22–5.81)
RDW: 13 % (ref 11.5–15.5)
RDW: 13.2 % (ref 11.5–15.5)
WBC: 6.3 10*3/uL (ref 4.0–10.5)
WBC: 8.2 10*3/uL (ref 4.0–10.5)
nRBC: 0 % (ref 0.0–0.2)
nRBC: 0 % (ref 0.0–0.2)

## 2023-12-28 LAB — POCT I-STAT 7, (LYTES, BLD GAS, ICA,H+H)
Acid-base deficit: 7 mmol/L — ABNORMAL HIGH (ref 0.0–2.0)
Bicarbonate: 18.5 mmol/L — ABNORMAL LOW (ref 20.0–28.0)
Calcium, Ion: 1.17 mmol/L (ref 1.15–1.40)
HCT: 20 % — ABNORMAL LOW (ref 39.0–52.0)
Hemoglobin: 6.8 g/dL — CL (ref 13.0–17.0)
O2 Saturation: 99 %
Patient temperature: 37
Potassium: 3.8 mmol/L (ref 3.5–5.1)
Sodium: 141 mmol/L (ref 135–145)
TCO2: 20 mmol/L — ABNORMAL LOW (ref 22–32)
pCO2 arterial: 35.2 mmHg (ref 32–48)
pH, Arterial: 7.329 — ABNORMAL LOW (ref 7.35–7.45)
pO2, Arterial: 144 mmHg — ABNORMAL HIGH (ref 83–108)

## 2023-12-28 LAB — BASIC METABOLIC PANEL WITH GFR
Anion gap: 9 (ref 5–15)
Anion gap: 9 (ref 5–15)
BUN: 13 mg/dL (ref 8–23)
BUN: 15 mg/dL (ref 8–23)
CO2: 22 mmol/L (ref 22–32)
CO2: 23 mmol/L (ref 22–32)
Calcium: 8.4 mg/dL — ABNORMAL LOW (ref 8.9–10.3)
Calcium: 8.4 mg/dL — ABNORMAL LOW (ref 8.9–10.3)
Chloride: 107 mmol/L (ref 98–111)
Chloride: 104 mmol/L (ref 98–111)
Creatinine, Ser: 0.8 mg/dL (ref 0.61–1.24)
Creatinine, Ser: 1.13 mg/dL (ref 0.61–1.24)
GFR, Estimated: >60 mL/min (ref >60)
GFR, Estimated: >60 mL/min (ref >60)
Glucose, Bld: 110 mg/dL — ABNORMAL HIGH (ref 70–99)
Glucose, Bld: 191 mg/dL — ABNORMAL HIGH (ref 70–99)
Potassium: 4 mmol/L (ref 3.5–5.1)
Potassium: 4.4 mmol/L (ref 3.5–5.1)
Sodium: 138 mmol/L (ref 135–145)
Sodium: 136 mmol/L (ref 135–145)

## 2023-12-28 LAB — MAGNESIUM
Magnesium: 2.3 mg/dL (ref 1.7–2.4)
Magnesium: 2.3 mg/dL (ref 1.7–2.4)

## 2023-12-28 LAB — LIPOPROTEIN A (LPA): Lipoprotein (a): 17.5 nmol/L (ref <75.0)

## 2023-12-28 MED ORDER — TRAMADOL HCL 50 MG PO TABS
50.0000 mg | ORAL_TABLET | ORAL | Status: DC | PRN
  Administered 2023-12-28: 100 mg via ORAL
  Administered 2023-12-29 – 2023-12-30 (×2): 50 mg via ORAL
  Filled 2023-12-28: qty 1
  Filled 2023-12-28: qty 2
  Filled 2023-12-28: qty 1

## 2023-12-28 MED ORDER — MECLIZINE HCL 25 MG PO TABS
25.0000 mg | ORAL_TABLET | Freq: Three times a day (TID) | ORAL | Status: DC
  Administered 2023-12-28 – 2024-01-03 (×19): 25 mg via ORAL
  Filled 2023-12-28 (×23): qty 1

## 2023-12-28 MED ORDER — OXYCODONE HCL 5 MG PO TABS
5.0000 mg | ORAL_TABLET | ORAL | Status: DC | PRN
  Administered 2023-12-28 – 2024-01-01 (×2): 10 mg via ORAL
  Administered 2024-01-01: 5 mg via ORAL
  Administered 2024-01-02 (×2): 10 mg via ORAL
  Filled 2023-12-28: qty 1
  Filled 2023-12-28: qty 2
  Filled 2023-12-28: qty 1
  Filled 2023-12-28 (×3): qty 2

## 2023-12-28 MED ORDER — ORAL CARE MOUTH RINSE
15.0000 mL | OROMUCOSAL | Status: DC | PRN
Start: 2023-12-28 — End: 2024-01-03

## 2023-12-28 MED ORDER — GABAPENTIN 100 MG PO CAPS
200.0000 mg | ORAL_CAPSULE | Freq: Three times a day (TID) | ORAL | Status: DC
  Administered 2023-12-28 – 2024-01-03 (×18): 200 mg via ORAL
  Filled 2023-12-28 (×18): qty 2

## 2023-12-28 MED ORDER — TRAMADOL HCL 50 MG PO TABS
100.0000 mg | ORAL_TABLET | Freq: Four times a day (QID) | ORAL | Status: DC
  Filled 2023-12-28: qty 2

## 2023-12-28 MED ORDER — ENOXAPARIN SODIUM 40 MG/0.4ML IJ SOSY: 40.0000 mg | PREFILLED_SYRINGE | INTRAMUSCULAR | Status: DC

## 2023-12-28 MED ORDER — INSULIN GLARGINE-YFGN 100 UNIT/ML ~~LOC~~ SOLN
12.0000 [IU] | Freq: Two times a day (BID) | SUBCUTANEOUS | Status: DC
  Administered 2023-12-28 – 2023-12-30 (×6): 12 [IU] via SUBCUTANEOUS
  Filled 2023-12-28 (×7): qty 0.12

## 2023-12-28 MED ORDER — ENOXAPARIN SODIUM 40 MG/0.4ML IJ SOSY
40.0000 mg | PREFILLED_SYRINGE | INTRAMUSCULAR | Status: DC
  Administered 2023-12-28 – 2024-01-02 (×6): 40 mg via SUBCUTANEOUS
  Filled 2023-12-28 (×6): qty 0.4

## 2023-12-28 MED ORDER — LIDOCAINE 5 % EX PTCH
1.0000 | MEDICATED_PATCH | CUTANEOUS | Status: DC
  Administered 2023-12-28 – 2024-01-03 (×6): 1 via TRANSDERMAL
  Filled 2023-12-28 (×7): qty 1

## 2023-12-28 MED ORDER — GABAPENTIN 100 MG PO CAPS
100.0000 mg | ORAL_CAPSULE | Freq: Three times a day (TID) | ORAL | Status: DC
  Administered 2023-12-28: 100 mg via ORAL
  Filled 2023-12-28: qty 1

## 2023-12-28 MED ORDER — INSULIN ASPART 100 UNIT/ML IJ SOLN
2.0000 [IU] | INTRAMUSCULAR | Status: DC
  Administered 2023-12-28 – 2023-12-29 (×3): 4 [IU] via SUBCUTANEOUS
  Administered 2023-12-29: 6 [IU] via SUBCUTANEOUS
  Administered 2023-12-29: 2 [IU] via SUBCUTANEOUS
  Administered 2023-12-29: 6 [IU] via SUBCUTANEOUS
  Administered 2023-12-29: 4 [IU] via SUBCUTANEOUS
  Administered 2023-12-30: 2 [IU] via SUBCUTANEOUS
  Administered 2023-12-30: 4 [IU] via SUBCUTANEOUS
  Administered 2023-12-30: 2 [IU] via SUBCUTANEOUS

## 2023-12-28 MED ORDER — METHOCARBAMOL 500 MG PO TABS
500.0000 mg | ORAL_TABLET | Freq: Three times a day (TID) | ORAL | Status: AC
  Administered 2023-12-28 – 2024-01-01 (×15): 500 mg via ORAL
  Filled 2023-12-28 (×15): qty 1

## 2023-12-28 MED ORDER — HYDROMORPHONE HCL 1 MG/ML IJ SOLN
0.5000 mg | Freq: Once | INTRAMUSCULAR | Status: AC
  Administered 2023-12-28: 0.5 mg via INTRAVENOUS
  Filled 2023-12-28: qty 0.5

## 2023-12-28 NOTE — Progress Notes (Signed)
 [address]       [place] [address]             [phone]                 1 Day Post-Op Procedure(s) (LRB): CORONARY ARTERY BYPASS GRAFTING (CABG) TIMES THREE USING LEFT INTERNAL MAMMARY ARTERY AND ENDOSCOPICALLY HARVESTED RIGHT GREATER SAPHENOUS VEIN (N/A) ECHOCARDIOGRAM, TRANSESOPHAGEAL (N/A)   Events: Some nausea and wretching this am _______________________________________________________________ Vitals: BP 137/67   Pulse 76   Temp 98.2 F (36.8 C)   Resp (!) 23   Ht 5\' 11"  (1.803 m)   Wt 84.4 kg   SpO2 99%   BMI 25.95 kg/m  Filed Weights   12/25/23 1153 12/27/23 1004 12/28/23 0500  Weight: 78.9 kg 78.9 kg 84.4 kg     - Neuro: alert NAD  - Cardiovascular: sinus  Drips: neo10.   CVP:  [0 mmHg-66 mmHg] 6 mmHg CO:  [3.3 L/min-10.8 L/min] 9 L/min CI:  [1.6 L/min/m2-5.4 L/min/m2] 4.5 L/min/m2  - Pulm: EWOB  ABG    Component Value Date/Time   PHART 7.329 (L) 12/28/2023 0033   PCO2ART 35.2 12/28/2023 0033   PO2ART 144 (H) 12/28/2023 0033   HCO3 18.5 (L) 12/28/2023 0033   TCO2 20 (L) 12/28/2023 0033   ACIDBASEDEF 7.0 (H) 12/28/2023 0033   O2SAT 99 12/28/2023 0033    - Abd: ND - Extremity: warm  .Intake/Output      06/03 0701 06/04 0700 06/04 0701 06/05 0700   P.O.     I.V. (mL/kg) 3075.5 (36.4) 36 (0.4)   IV Piggyback 3047.3    Total Intake(mL/kg) 6122.7 (72.5) 36 (0.4)   Urine (mL/kg/hr) 3295 (1.6)    Emesis/NG output 110    Stool     Blood 350    Chest Tube 330    Total Output 4085    Net +2037.7 +36           _______________________________________________________________ Labs:    Latest Ref Rng & Units 12/28/2023    3:50 AM 12/28/2023   12:33 AM 12/27/2023   11:20 PM  CBC  WBC 4.0 - 10.5 K/uL 6.3   7.7   Hemoglobin 13.0 - 17.0 g/dL 8.5  6.8  8.7   Hematocrit 39.0 - 52.0 % 24.9  20.0  24.8   Platelets 150 - 400 K/uL 110   114       Latest Ref Rng & Units 12/28/2023    3:50 AM 12/28/2023   12:33 AM 12/27/2023    11:15 PM  CMP  Glucose 70 - 99 mg/dL 130     BUN 8 - 23 mg/dL 13     Creatinine 8.65 - 1.24 mg/dL 7.84     Sodium 696 - 295 mmol/L 138  141  143   Potassium 3.5 - 5.1 mmol/L 4.0  3.8  3.8   Chloride 98 - 111 mmol/L 107     CO2 22 - 32 mmol/L 22     Calcium  8.9 - 10.3 mg/dL 8.4       CXR: L effusion  _______________________________________________________________  Assessment and Plan: POD 1 s/p CABG  Neuro: adjusting pain medication CV: on A/S/BB.  Will remove pacing wires.  Will d/c a-line once off neo Pulm: IS, ambulation Renal: creat stable GI: on phenergan  and reglan .  Will keep clears today Heme: stable ID: [email or web address] Endo: SSI Dispo: continue ICU care   Scott Donaldson O Scott Donaldson 12/28/2023 8:08 AM

## 2023-12-28 NOTE — Progress Notes (Signed)
      [address]       [place] [address]             [phone]      POD # 1 CABG x 3  Sleeping currently Vertigo, nausea and pain remain issues  BP 110/82   Pulse 70   Temp 99 F (37.2 C)   Resp 16   Ht 5\' 11"  (1.803 m)   Wt 84.4 kg   SpO2 92%   BMI 25.95 kg/m  2 L [place] 92% sat  Intake/Output Summary (Last 24 hours) at 12/28/2023 1639 Last data filed at 12/28/2023 1600 Gross per 24 hour  Intake 4705.66 ml  Output 3440 ml  Net 1265.66 ml   CBG normal this AM, elevated this afternoon  PM labs pending  Landon Pinion C. Luna Salinas, MD [facility] [phone]

## 2023-12-28 NOTE — Plan of Care (Signed)
  Problem: Education: Goal: Knowledge of General Education information will improve Description: Including pain rating scale, medication(s)/side effects and non-pharmacologic comfort measures Outcome: Progressing   Problem: Health Behavior/Discharge Planning: Goal: Ability to manage health-related needs will improve Outcome: Progressing   Problem: Clinical Measurements: Goal: Ability to maintain clinical measurements within normal limits will improve Outcome: Progressing Goal: Will remain free from infection Outcome: Progressing Goal: Diagnostic test results will improve Outcome: Progressing Goal: Respiratory complications will improve Outcome: Progressing Goal: Cardiovascular complication will be avoided Outcome: Progressing   Problem: Elimination: Goal: Will not experience complications related to urinary retention Outcome: Progressing   Problem: Safety: Goal: Ability to remain free from injury will improve Outcome: Progressing   Problem: Skin Integrity: Goal: Risk for impaired skin integrity will decrease Outcome: Progressing   Problem: Education: Goal: Understanding of CV disease, CV risk reduction, and recovery process will improve Outcome: Progressing   Problem: Cardiovascular: Goal: Ability to achieve and maintain adequate cardiovascular perfusion will improve Outcome: Progressing Goal: Vascular access site(s) Level 0-1 will be maintained Outcome: Progressing   Problem: Health Behavior/Discharge Planning: Goal: Ability to safely manage health-related needs after discharge will improve Outcome: Progressing   Problem: Education: Goal: Will demonstrate proper wound care and an understanding of methods to prevent future damage Outcome: Progressing Goal: Knowledge of disease or condition will improve Outcome: Progressing Goal: Knowledge of the prescribed therapeutic regimen will improve Outcome: Progressing   Problem: Cardiac: Goal: Will achieve and/or  maintain hemodynamic stability Outcome: Progressing   Problem: Respiratory: Goal: Respiratory status will improve Outcome: Progressing   Problem: Skin Integrity: Goal: Wound healing without signs and symptoms of infection Outcome: Progressing Goal: Risk for impaired skin integrity will decrease Outcome: Progressing   Problem: Urinary Elimination: Goal: Ability to achieve and maintain adequate renal perfusion and functioning will improve Outcome: Progressing

## 2023-12-28 NOTE — Progress Notes (Signed)
 NAME:  Scott Donaldson, MRN:  [id number], DOB:  07-06-1951, LOS: 1 ADMISSION DATE:  12/25/2023, CONSULTATION DATE:  6/3 REFERRING MD:  Dr. Deloise Ferries, CHIEF COMPLAINT:  CABG x 3   History of Present Illness:  Patient is a 73 yo M w/ pertinent PMH CAD and NSTEMI 2023 s/p PCI and stent placement, HTN, HLD, DMT2 presents to [facility] on 6/1 chest pain.  Patient presents to [facility] on 6/1 w/ acute chest pain. EKG showing LBBB, LVH. Minimal troponin 45 to 62. Cardiology consulted placed on heparin . On 6/2 heart cath showing severe left main, ostial lcx, rca disease. On DAPT and high intesnity statin. TCTS consulted. CABG x 3 (LIMA to LAD, SVG to OM, SVG to PDA) on 6/3. Post op intubated/sedated brought to 2H icu. PCCM consulted.  EBL:  Blood Administration: none Xclamp Time:  53 min Pump Time:    Pertinent  Medical History   Past Medical History:  Diagnosis Date   Abdominal pain 08/04/2016   Abnormal PSA 05/19/2015   Anemia    Arthralgia 11/12/2010   Benign prostatic hyperplasia with urinary hesitancy 06/14/2018   Cataract    Coronary artery disease    nonobstructive   Diabetes (HCC) 03/04/2007   Qualifier: Diagnosis of  By: Washington Hacker MD, Sean A    DIABETES MELLITUS, TYPE II 03/04/2007   Elevated LDL cholesterol level 06/14/2018   Foot ulcer, right (HCC) 04/15/2014   GERD (gastroesophageal reflux disease)    HOH (hard of hearing)    both ears no hearing aides   HYPERCHOLESTEROLEMIA 07/29/2008   HYPERTENSION 03/04/2007   Knee pain, left 05/16/2015   Nonintractable episodic headache 12/01/2016   migraine   SHOULDER PAIN, BILATERAL 01/12/2010   Qualifier: Diagnosis of  By: Washington Hacker MD, Sean A    TUBULOVILLOUS ADENOMA, 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 rate 12, height 5\' 11"  (1.803 m), weight 84.4 kg, SpO2 100%. CVP:  [0 mmHg-66 mmHg] 2 mmHg CO:  [3.3 L/min-10.8 L/min] 7.5 L/min CI:  [1.6 L/min/m2-5.4 L/min/m2] 3.8 L/min/m2  Vent Mode: PSV;CPAP FiO2 (%):  [40 %-50 %] 40 % Set Rate:  [4 bmp-16 bmp] 4 bmp Vt Set:  [600 mL] 600 mL PEEP:  [5 cmH20] 5 cmH20 Pressure Support:  [10 cmH20] 10 cmH20 Plateau Pressure:  [16 cmH20-17 cmH20] 17 cmH20   Intake/Output Summary (Last 24 hours) at 12/28/2023 4196 Last data filed at 12/28/2023 0700 Gross per 24 hour  Intake 6122.71 ml  Output 4085 ml  Net 2037.71 ml   Filed Weights   12/25/23 1153 12/27/23 1004 12/28/23 0500  Weight: 78.9 kg 78.9 kg 84.4 kg    Examination: General: Acute on chronic older male lying in ICU bed in no distress HEENT: Normocephalic, PERRLA intact, teeth intact, pink moist mucosa Neuro: Alert and oriented x 4, follows commands, complains of upper chest pain-postsurgical CV: S1-S2, paced rate 80, chest tubes in place with minimal serosanguineous output Pulm: Clear, diminished, no acute  distress GI: Soft, bowel sounds active x 4 Extremities: Warm/dry, no edema Skin: No rashes or lesions GU: Foley intact  Resolved Hospital Problem list   Postop vent management  Assessment & Plan:   Multivessel CAD s/p CABG x 3  -(LIMA to LAD, SVG to OM, SVG to PDA) NSTEMI 2023  HTN HLD Plan: Continue to monitor cardiac index, cardiac output Wean down Neo-Synephrine, MAP goal greater than 65 If hypotension becomes an issue will need to utilize Cleviprex Continue aspirin  and statin Continue to wean insulin  infusion per protocol Continue postoperative care per TCTS Continue chest tube management per  protocol Continue pain management with morphine , oxycodone, tramadol  -Pain localized postsurgical upper chest and not controlled-will increase tramadol  100 mg every 6 hours, and oxycodone 5 to 10 mg every 3 hours Mobilize up to chair per protocol when patient able Continue deep breathing exercises along with incentive spirometer  DMT2 6/1 hgb A1c 13.3  Plan: Continue to wean insulin  infusion -Will need sliding scale coverage as well as long-acting Continue CBG every 4  Expected post-operative ABLA Expected post-operative consumptive thrombocytopenia  Hemoglobin 8.5, platelets 110 Plan:  Continue to monitor for bleeding per chest tube output Continue to monitor H&H and platelets daily per CBC Transfuse for hemoglobin less than 7   Best Practice (right click and "Reselect all SmartList Selections" daily)   Diet/type: Carb modified/heart healthy DVT prophylaxis: SCD GI prophylaxis: PPI Lines: Central line and Arterial Line Foley:  Yes, and it is still needed Code Status:  full code Last date of multidisciplinary goals of care discussion [per primary]   Critical care time: 45 minutes    Christian Lamar Naef AGACNP-BC   [facility] 12/28/2023, 7:31 AM  Please see [email or web address] for pager details.  From 7A-7P if no response, please call [phone]. After hours, please call ELink [phone].

## 2023-12-28 NOTE — Progress Notes (Signed)
 Interim CCM Progress Note:  Patient still having nausea/dizziness/and post surgical Pain  Patient and daughter reports of having history of vertigo  Vital Signs stable, sinus rhythm P:  Meclizine  ordered and first dose given  Give one time diluadid 0.5mg   Continue pain regimen ordered: morphine , oxy, tramadol , robaxin, gabapentin    Will increase gabapentin  from 100mg  to 200mg  TID   Rey Catholic AGACNP-BC   [facility] 12/28/2023, 2:30 PM  Please see [email or web address] for pager details.  From 7A-7P if no response, please call [phone]. After hours, please call ELink [phone].

## 2023-12-29 LAB — BASIC METABOLIC PANEL WITH GFR
Anion gap: 11 (ref 5–15)
BUN: 21 mg/dL (ref 8–23)
CO2: 24 mmol/L (ref 22–32)
Calcium: 8.5 mg/dL — ABNORMAL LOW (ref 8.9–10.3)
Chloride: 103 mmol/L (ref 98–111)
Creatinine, Ser: 1.38 mg/dL — ABNORMAL HIGH (ref 0.61–1.24)
GFR, Estimated: 54 mL/min — ABNORMAL LOW (ref >60)
Glucose, Bld: 96 mg/dL (ref 70–99)
Potassium: 4 mmol/L (ref 3.5–5.1)
Sodium: 138 mmol/L (ref 135–145)

## 2023-12-29 LAB — CBC
HCT: 24.9 % — ABNORMAL LOW (ref 39.0–52.0)
Hemoglobin: 8.3 g/dL — ABNORMAL LOW (ref 13.0–17.0)
MCH: 30.6 pg (ref 26.0–34.0)
MCHC: 33.3 g/dL (ref 30.0–36.0)
MCV: 91.9 fL (ref 80.0–100.0)
Platelets: 110 10*3/uL — ABNORMAL LOW (ref 150–400)
RBC: 2.71 MIL/uL — ABNORMAL LOW (ref 4.22–5.81)
RDW: 13.5 % (ref 11.5–15.5)
WBC: 7.2 10*3/uL (ref 4.0–10.5)
nRBC: 0 % (ref 0.0–0.2)

## 2023-12-29 LAB — GLUCOSE, CAPILLARY
Glucose-Capillary: 113 mg/dL — ABNORMAL HIGH (ref 70–99)
Glucose-Capillary: 179 mg/dL — ABNORMAL HIGH (ref 70–99)
Glucose-Capillary: 251 mg/dL — ABNORMAL HIGH (ref 70–99)
Glucose-Capillary: 247 mg/dL — ABNORMAL HIGH (ref 70–99)
Glucose-Capillary: 180 mg/dL — ABNORMAL HIGH (ref 70–99)

## 2023-12-29 LAB — MAGNESIUM: Magnesium: 2.5 mg/dL — ABNORMAL HIGH (ref 1.7–2.4)

## 2023-12-29 MED ORDER — SODIUM CHLORIDE 0.9 % IV SOLN
250.0000 mL | INTRAVENOUS | Status: DC | PRN
Start: 2023-12-29 — End: 2023-12-29

## 2023-12-29 MED ORDER — ~~LOC~~ CARDIAC SURGERY, PATIENT & FAMILY EDUCATION
Freq: Once | Status: AC
  Administered 2023-12-29: 1

## 2023-12-29 MED ORDER — SODIUM CHLORIDE 0.9% FLUSH: 3.0000 mL | INTRAVENOUS | Status: DC | PRN

## 2023-12-29 MED ORDER — SODIUM CHLORIDE 0.9% FLUSH
3.0000 mL | Freq: Two times a day (BID) | INTRAVENOUS | Status: DC
Start: 2023-12-29 — End: 2023-12-29

## 2023-12-29 MED FILL — Calcium Chloride Inj 10%: INTRAVENOUS | Qty: 10 | Status: AC

## 2023-12-29 MED FILL — Electrolyte-R (PH 7.4) Solution: INTRAVENOUS | Qty: 3000 | Status: AC

## 2023-12-29 MED FILL — Mannitol IV Soln 20%: INTRAVENOUS | Qty: 500 | Status: AC

## 2023-12-29 MED FILL — Heparin Sodium (Porcine) Inj 1000 Unit/ML: INTRAMUSCULAR | Qty: 20 | Status: AC

## 2023-12-29 MED FILL — Sodium Chloride IV Soln 0.9%: INTRAVENOUS | Qty: 2000 | Status: AC

## 2023-12-29 NOTE — Progress Notes (Addendum)
 TCTS DAILY ICU PROGRESS NOTE                   [address]            [place] [id number]          [phone]   2 Days Post-Op Procedure(s) (LRB): CORONARY ARTERY BYPASS GRAFTING (CABG) TIMES THREE USING LEFT INTERNAL MAMMARY ARTERY AND ENDOSCOPICALLY HARVESTED RIGHT GREATER SAPHENOUS VEIN (N/A) ECHOCARDIOGRAM, TRANSESOPHAGEAL (N/A)  Total Length of Stay:  LOS: 2 days   Subjective: Up in the bedside chair. Had a lot of nausea with vertigo yesterday that is much better today. Denies abd pain.   On RA. CT drainage 80ml past 12 hours.    Objective: Vital signs in last 24 hours: Temp:  [97.9 F (36.6 C)-99.5 F (37.5 C)] 98.8 F (37.1 C) (06/05 0606) Pulse Rate:  [56-83] 74 (06/05 0606) Cardiac Rhythm: Normal sinus rhythm (06/05 0400) Resp:  [7-38] 18 (06/05 0606) BP: (83-153)/(37-86) 121/52 (06/05 0606) SpO2:  [85 %-100 %] 90 % (06/05 0606) Arterial Line BP: (112-197)/(30-64) 197/63 (06/04 1100) Weight:  [83.1 kg] 83.1 kg (06/05 0500)  Filed Weights   12/27/23 1004 12/28/23 0500 12/29/23 0500  Weight: 78.9 kg 84.4 kg 83.1 kg    Weight change: 4.2 kg   Hemodynamic parameters for last 24 hours: CVP:  [4 mmHg-83 mmHg] 83 mmHg CO:  [7.7 L/min-11.6 L/min] 11.6 L/min CI:  [3.9 L/min/m2-5.8 L/min/m2] 5.8 L/min/m2  Intake/Output from previous day: 06/04 0701 - 06/05 0700 In: 501.4 [I.V.:301.5; IV Piggyback:199.9] Out: 1045 [Urine:770; Emesis/NG output:95; Chest Tube:180]  Intake/Output this shift: No intake/output data recorded.  Current Meds: Scheduled Meds:  acetaminophen   1,000 mg Oral Q6H   Or   acetaminophen  (TYLENOL ) oral liquid 160 mg/5 mL  1,000 mg Per Tube Q6H   aspirin  EC  325 mg Oral Daily   Or   aspirin   324 mg Per Tube Daily   atorvastatin   80 mg Oral Daily   bisacodyl   10 mg Oral Daily   Or   bisacodyl   10 mg Rectal Daily   Chlorhexidine  Gluconate Cloth  6 each Topical Daily   docusate sodium   200 mg Oral Daily   enoxaparin  (LOVENOX) injection  40 mg Subcutaneous Q24H   feeding supplement  237 mL Oral BID BM   fenofibrate  160 mg Oral Daily   gabapentin   200 mg Oral TID   insulin  aspart  2-6 Units Subcutaneous Q4H   insulin  glargine-yfgn  12 Units Subcutaneous BID   lidocaine   1 patch Transdermal Q24H   meclizine   25 mg Oral TID   methocarbamol  500 mg Oral TID   metoprolol  tartrate  12.5 mg Oral BID   pantoprazole  40 mg Oral Daily   sodium chloride  flush  10-40 mL Intracatheter Q12H   sodium chloride  flush  3 mL Intravenous Q12H   Continuous Infusions:  albumin human Stopped (12/28/23 0018)    ceFAZolin (ANCEF) IV 2 g (12/29/23 0509)   insulin  Stopped (12/28/23 1205)   nitroGLYCERIN      phenylephrine  (NEO-SYNEPHRINE) Adult infusion Stopped (12/28/23 0959)   PRN Meds:.albumin human, dextrose , metoprolol  tartrate, morphine  injection, ondansetron  (ZOFRAN ) IV, mouth rinse, oxyCODONE, sodium chloride  flush, sodium chloride  flush, traMADol   General appearance: alert, cooperative, and mild distress Neurologic: intact Heart: RRR, NSR on monitor.  Lungs: breath sounds clear, shallow. Minimal CT drainage. CXR stable Abdomen: soft, no tenderness, reare bowel sounds Extremities: all well perfused, mild LE edema. The  RLE incision are dry. Wound: the sternotomy incision is covered with a dry Aquacel dressing.   Lab Results: CBC: Recent Labs    12/28/23 1637 12/29/23 0518  WBC 8.2 7.2  HGB 8.8* 8.3*  HCT 26.3* 24.9*  PLT 118* 110*   BMET:  Recent Labs    12/28/23 1637 12/29/23 0518  NA 136 138  K 4.4 4.0  CL 104 103  CO2 23 24  GLUCOSE 191* 96  BUN 15 21  CREATININE 1.13 1.38*  CALCIUM  8.4* 8.5*    CMET: Lab Results  Component Value Date   WBC 7.2 12/29/2023   HGB 8.3 (L) 12/29/2023   HCT 24.9 (L) 12/29/2023   PLT 110 (L) 12/29/2023   GLUCOSE 96 12/29/2023   CHOL 277 (H) 12/26/2023   TRIG 403 (H) 12/26/2023   HDL 37 (L) 12/26/2023   LDLDIRECT 186 (H) 12/26/2023   LDLCALC UNABLE TO  CALCULATE IF TRIGLYCERIDE OVER 400 mg/dL 21/30/8657   ALT 18 84/69/6295   AST 17 10/22/2021   NA 138 12/29/2023   K 4.0 12/29/2023   CL 103 12/29/2023   CREATININE 1.38 (H) 12/29/2023   BUN 21 12/29/2023   CO2 24 12/29/2023   TSH 2.01 11/16/2021   PSA 11.88 (H) 07/23/2019   INR 1.4 (H) 12/27/2023   HGBA1C 13.3 (H) 12/25/2023   MICROALBUR 40.5 (H) 12/05/2019      PT/INR:  Recent Labs    12/27/23 1750  LABPROT 17.3*  INR 1.4*   Radiology: No results found.   Assessment/Plan: S/P Procedure(s) (LRB): CORONARY ARTERY BYPASS GRAFTING (CABG) TIMES THREE USING LEFT INTERNAL MAMMARY ARTERY AND ENDOSCOPICALLY HARVESTED RIGHT GREATER SAPHENOUS VEIN (N/A) ECHOCARDIOGRAM, TRANSESOPHAGEAL (N/A)  -POD2 CABG x 3 after presenting with angina and new LBBB. H/O NSTEMI and PCI in 2023. Has preserved biventricular function. Stable VS and cardiac rhythm. Continue ASA, statin, BB. D/C the chest tubes and cordis. Transfer to 4E.   -GI: Nausea / Vertigo. Much better today. Allow PO intake as desired. Continue Antivert .  -ENDO: type 2 DM with poor control PTA.  HgbA1C 13.  Reasonable glucose control on Semglee  12 u BID. Continue to monitor.   -RENAL: small bump in Creat 1.13->1.38. Monitoring. Avoid hypotension.   -PULM: good oxygenation on RA. Continue to work on [employer]. .  -Neuro: intact. Continue multi-modal pain mgt for now.   -DVT PPX- ambulate, daily enoxaparin SQ.      Myron G. Roddenberry, PA-C 12/29/2023 7:39 AM   Agree Doing well Floor  Ima Hafner Ala Alice

## 2023-12-29 NOTE — Progress Notes (Signed)
 Patient and family reporting that patient's numbness in LLE has increased from baseline today.  Foot was numb before and now the numbness is almost up to the knee.  PA made aware and reporting to bedside.

## 2023-12-29 NOTE — Progress Notes (Signed)
      [address]       [place] [address]             [phone]    Called by the RN because the patient reported he could not feel his left leg from the knee down including his left foot. On arrival the patient is stable. He has severe neuropathy and has a history of worsening numbness of both legs at times. He reports he was standing earlier and could not feel the ground. 2+ DP/PT pulses bilaterally, +doppler signals bilaterally per RN. Strength was weakened bilaterally, patient could wiggle toes to command and had intact sensory function to touch on bilateral feet. I suspect this is his existing neuropathy with an acute flare up which he reports a history of. We will continue to monitor closely. Will also order PT/OT evaluations.  Randa Burton, PA-C 12/29/23

## 2023-12-30 ENCOUNTER — Telehealth: Payer: Self-pay

## 2023-12-30 LAB — BPAM RBC
Blood Product Expiration Date: 202506302359
Blood Product Expiration Date: 202506302359
Blood Product Expiration Date: 202506302359
Blood Product Expiration Date: 202506302359
ISSUE DATE / TIME: 202506040003
ISSUE DATE / TIME: 202506040256
Unit Type and Rh: 5100
Unit Type and Rh: 5100
Unit Type and Rh: 5100
Unit Type and Rh: 5100

## 2023-12-30 LAB — GLUCOSE, CAPILLARY
Glucose-Capillary: 119 mg/dL — ABNORMAL HIGH (ref 70–99)
Glucose-Capillary: 134 mg/dL — ABNORMAL HIGH (ref 70–99)
Glucose-Capillary: 136 mg/dL — ABNORMAL HIGH (ref 70–99)
Glucose-Capillary: 197 mg/dL — ABNORMAL HIGH (ref 70–99)
Glucose-Capillary: 277 mg/dL — ABNORMAL HIGH (ref 70–99)
Glucose-Capillary: 91 mg/dL (ref 70–99)

## 2023-12-30 LAB — TYPE AND SCREEN
ABO/RH(D): O POS
Antibody Screen: NEGATIVE
Unit division: 0
Unit division: 0
Unit division: 0
Unit division: 0

## 2023-12-30 LAB — BASIC METABOLIC PANEL WITH GFR
Anion gap: 9 (ref 5–15)
BUN: 30 mg/dL — ABNORMAL HIGH (ref 8–23)
CO2: 23 mmol/L (ref 22–32)
Calcium: 8.1 mg/dL — ABNORMAL LOW (ref 8.9–10.3)
Chloride: 104 mmol/L (ref 98–111)
Creatinine, Ser: 1.37 mg/dL — ABNORMAL HIGH (ref 0.61–1.24)
GFR, Estimated: 54 mL/min — ABNORMAL LOW (ref >60)
Glucose, Bld: 108 mg/dL — ABNORMAL HIGH (ref 70–99)
Potassium: 3.8 mmol/L (ref 3.5–5.1)
Sodium: 136 mmol/L (ref 135–145)

## 2023-12-30 LAB — CBC
HCT: 25.9 % — ABNORMAL LOW (ref 39.0–52.0)
Hemoglobin: 8.5 g/dL — ABNORMAL LOW (ref 13.0–17.0)
MCH: 29.6 pg (ref 26.0–34.0)
MCHC: 32.8 g/dL (ref 30.0–36.0)
MCV: 90.2 fL (ref 80.0–100.0)
Platelets: 138 10*3/uL — ABNORMAL LOW (ref 150–400)
RBC: 2.87 MIL/uL — ABNORMAL LOW (ref 4.22–5.81)
RDW: 13.2 % (ref 11.5–15.5)
WBC: 7.2 10*3/uL (ref 4.0–10.5)
nRBC: 0 % (ref 0.0–0.2)

## 2023-12-30 MED ORDER — TAMSULOSIN HCL 0.4 MG PO CAPS
0.4000 mg | ORAL_CAPSULE | Freq: Every day | ORAL | Status: DC
  Administered 2023-12-30 – 2024-01-03 (×5): 0.4 mg via ORAL
  Filled 2023-12-30 (×5): qty 1

## 2023-12-30 NOTE — Progress Notes (Signed)
 [address]            [place] [address]          [phone]   3 Days Post-Op Procedure(s) (LRB): CORONARY ARTERY BYPASS GRAFTING (CABG) TIMES THREE USING LEFT INTERNAL MAMMARY ARTERY AND ENDOSCOPICALLY HARVESTED RIGHT GREATER SAPHENOUS VEIN (N/A) ECHOCARDIOGRAM, TRANSESOPHAGEAL (N/A)  Total Length of Stay:  LOS: 3 days   Subjective: Transferred from ICU yesterday.  Sleeping but awakened easily and is alert and oriented.  Said the nausea and dizziness have resolved.    Objective: Vital signs in last 24 hours: Temp:  [97.9 F (36.6 C)-99.1 F (37.3 C)] 98.7 F (37.1 C) (06/06 0320) Pulse Rate:  [65-85] 69 (06/06 0500) Cardiac Rhythm: Normal sinus rhythm;Bundle branch block (06/05 1900) Resp:  [7-36] 10 (06/06 0500) BP: (105-155)/(50-71) 118/58 (06/06 0320) SpO2:  [90 %-96 %] 94 % (06/06 0500) FiO2 (%):  [22 %] 22 % (06/05 0811) Weight:  [85.3 kg] 85.3 kg (06/06 0500)  Filed Weights   12/28/23 0500 12/29/23 0500 12/30/23 0500  Weight: 84.4 kg 83.1 kg 85.3 kg    Weight change: 2.2 kg     Intake/Output from previous day: 06/05 0701 - 06/06 0700 In: 940 [P.O.:840; IV Piggyback:100] Out: 875 [Urine:845; Chest Tube:30]  Intake/Output this shift: No intake/output data recorded.  Current Meds: Scheduled Meds:  acetaminophen   1,000 mg Oral Q6H   Or   acetaminophen  (TYLENOL ) oral liquid 160 mg/5 mL  1,000 mg Per Tube Q6H   aspirin  EC  325 mg Oral Daily   Or   aspirin   324 mg Per Tube Daily   atorvastatin   80 mg Oral Daily   bisacodyl   10 mg Oral Daily   Or   bisacodyl   10 mg Rectal Daily   Chlorhexidine  Gluconate Cloth  6 each Topical Daily   docusate sodium   200 mg Oral Daily   enoxaparin (LOVENOX) injection  40 mg Subcutaneous Q24H   feeding supplement  237 mL Oral BID BM   fenofibrate  160 mg Oral Daily   gabapentin   200 mg Oral TID   insulin  aspart  2-6 Units Subcutaneous Q4H   insulin  glargine-yfgn  12 Units  Subcutaneous BID   lidocaine   1 patch Transdermal Q24H   meclizine   25 mg Oral TID   methocarbamol  500 mg Oral TID   metoprolol  tartrate  12.5 mg Oral BID   pantoprazole  40 mg Oral Daily   tamsulosin   0.4 mg Oral Daily   Continuous Infusions:  albumin human Stopped (12/28/23 0018)   insulin  Stopped (12/28/23 1205)   PRN Meds:.albumin human, dextrose , metoprolol  tartrate, morphine  injection, ondansetron  (ZOFRAN ) IV, mouth rinse, oxyCODONE, traMADol   General appearance: alert, cooperative, and mild distress Neurologic: intact Heart: RRR, NSR on monitor.  Lungs: breath sounds clear, shallow.  Abdomen: soft, no tenderness, rear bowel sounds Extremities: all well perfused, mild LE edema. The RLE incisions are dry. Wound: the sternotomy incision is covered with a dry Aquacel dressing.   Lab Results: CBC: Recent Labs    12/29/23 0518 12/30/23 0353  WBC 7.2 7.2  HGB 8.3* 8.5*  HCT 24.9* 25.9*  PLT 110* 138*   BMET:  Recent Labs    12/29/23 0518 12/30/23 0353  NA 138 136  K 4.0 3.8  CL 103 104  CO2 24 23  GLUCOSE 96 108*  BUN 21 30*  CREATININE  1.38* 1.37*  CALCIUM  8.5* 8.1*    CMET: Lab Results  Component Value Date   WBC 7.2 12/30/2023   HGB 8.5 (L) 12/30/2023   HCT 25.9 (L) 12/30/2023   PLT 138 (L) 12/30/2023   GLUCOSE 108 (H) 12/30/2023   CHOL 277 (H) 12/26/2023   TRIG 403 (H) 12/26/2023   HDL 37 (L) 12/26/2023   LDLDIRECT 186 (H) 12/26/2023   LDLCALC UNABLE TO CALCULATE IF TRIGLYCERIDE OVER 400 mg/dL 29/56/2130   ALT 18 86/57/8469   AST 17 10/22/2021   NA 136 12/30/2023   K 3.8 12/30/2023   CL 104 12/30/2023   CREATININE 1.37 (H) 12/30/2023   BUN 30 (H) 12/30/2023   CO2 23 12/30/2023   TSH 2.01 11/16/2021   PSA 11.88 (H) 07/23/2019   INR 1.4 (H) 12/27/2023   HGBA1C 13.3 (H) 12/25/2023   MICROALBUR 40.5 (H) 12/05/2019      PT/INR:  Recent Labs    12/27/23 1750  LABPROT 17.3*  INR 1.4*   Radiology: No results  found.   Assessment/Plan: S/P Procedure(s) (LRB): CORONARY ARTERY BYPASS GRAFTING (CABG) TIMES THREE USING LEFT INTERNAL MAMMARY ARTERY AND ENDOSCOPICALLY HARVESTED RIGHT GREATER SAPHENOUS VEIN (N/A) ECHOCARDIOGRAM, TRANSESOPHAGEAL (N/A)  -POD3 CABG x 3 after presenting with angina and new LBBB. H/O NSTEMI and PCI in 2023. Has preserved biventricular function. Stable VS and cardiac rhythm. Continue ASA, statin, BB. Advance activity. PT/OT evaluations pending.   -GI: Nausea / Vertigo. Resolved.  -ENDO: type 2 DM with poor control PTA.  HgbA1C 13.  Fair glucose control on Semglee  12 u BID. Continue to monitor and provide SSI.   -RENAL: small bump in Creat 1.13->1.38 yesterday and is stable today. Monitoring. Avoid hypotension.   -PULM: good oxygenation on RA. Continue to work on [employer]. .  -Neuro: intact grossly.  His h/o peripheral neuropathy and reported having no feeling in his left lower leg yesterday. Able to move all extremities normally and sensation grossly intact.    PT/OT evaluations requested.   -DVT PPX- ambulate, daily enoxaparin SQ.     Orvin Netter G. Voyd Groft, PA-C 12/30/2023 7:36 AM

## 2023-12-30 NOTE — Telephone Encounter (Signed)
 Dismissed from the practice   Copied from CRM [id number]. Topic: General - Other >> Dec 30, 2023  3:55 PM Dorisann Garre T wrote: Reason for CRM: rio from [facility] is needing to know if patient has heart disease or  diabetes call back number is [phone]

## 2023-12-30 NOTE — Inpatient Diabetes Management (Signed)
 Inpatient Diabetes Program Recommendations  AACE/ADA: New Consensus Statement on Inpatient Glycemic Control (2015)  Target Ranges:  Prepandial:   less than 140 mg/dL      Peak postprandial:   less than 180 mg/dL (1-2 hours)      Critically ill patients:  140 - 180 mg/dL   Lab Results  Component Value Date   GLUCAP 119 (H) 12/30/2023   HGBA1C 13.3 (H) 12/25/2023   Please have meds to bed @ discharge so patient is able to get a receiver to use with his sensors.  Discharge Recommendations: Other recommendations: Dexcom G7 [id number], Dexcom Receiver [phone]  ( please deliver meds to bed) Long acting recommendations: Insulin  Glargine (LANTUS ) Solostar Pen 24 units daily  Short acting recommendations:  Meal + Correction coverage Insulin  lispro (HUMALOG ) KwikPen  Sensitive Scale.  5 units tid if eats 50% Hypoglycemia treatment recommendations: Baqsimi 3mg  Supply/Referral recommendations: Glucometer Test strips Lancet device Lancets Pen needles - standard   Use Adult Diabetes Insulin  Treatment Post Discharge order set.  Thank you, Barnett Elzey E. Dann Galicia, RN, MSN, CDCES  Diabetes Coordinator Inpatient Glycemic Control Team Team Pager [phone] (8am-5pm) 12/30/2023 9:18 AM

## 2023-12-30 NOTE — Evaluation (Signed)
 Occupational Therapy Evaluation Patient Details Name: Scott Donaldson MRN: [id number] DOB: June 03, 1951 Today's Date: 12/30/2023   History of Present Illness   73 y.o. male presents to [facility] 12/25/23 with chest pain and admitted for unstable angina. 6/2 L heart cath. 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 Extubated 6/4.  PMHx: CAD (NSTEMI in 2023), PCI to Lcx), HTN, T2DM, & HLD, vertigo.     Clinical Impressions Pt premedicated for pain and nausea prior to visit. Pt without complaint of sternal pain, reports pain in buttocks. Grimacing throughout session. Pt needing moderate assistance for bed level mobility and demonstrates fair sitting balance, keeping eyes closed due to dizziness despite cues to to visually focus on a target. Pt's family report pt being alert and interactive all day yesterday while they visited. Pt currently needs set up to total assist for ADLs. Likely to progress to return home with his supportive family once fatigue and dizziness improve. Will follow acutely.      If plan is discharge home, recommend the following:   A little help with bathing/dressing/bathroom;Assistance with cooking/housework;Assist for transportation;Help with stairs or ramp for entrance     Functional Status Assessment   Patient has had a recent decline in their functional status and demonstrates the ability to make significant improvements in function in a reasonable and predictable amount of time.     Equipment Recommendations   BSC/3in1;Tub/shower seat     Recommendations for Other Services         Precautions/Restrictions   Precautions Precautions: Sternal;Fall Precaution Booklet Issued: Yes (comment) Restrictions Weight Bearing Restrictions Per Provider Order: No     Mobility Bed Mobility Overal bed mobility: Needs Assistance Bed Mobility: Rolling, Sidelying to Sit, Sit to Sidelying Rolling: Mod assist Sidelying to sit: Mod assist, HOB elevated     Sit to sidelying: Mod assist General bed  mobility comments: cues to avoid use of hand on rail, use of pad to assist, positioned on R side off buttocks at end of session    Transfers                   General transfer comment: deferred due to fatigue      Balance Overall balance assessment: Needs assistance, Mild deficits observed, not formally tested   Sitting balance-Leahy Scale: Fair                                     ADL either performed or assessed with clinical judgement   ADL Overall ADL's : Needs assistance/impaired Eating/Feeding: Set up;Sitting;Bed level   Grooming: Set up;Sitting;Bed level   Upper Body Bathing: Moderate assistance;Sitting   Lower Body Bathing: Total assistance;Bed level   Upper Body Dressing : Moderate assistance;Sitting   Lower Body Dressing: Total assistance;Bed level                       Vision Ability to See in Adequate Light: 0 Adequate Patient Visual Report: No change from baseline Additional Comments: photophobia     Perception         Praxis         Pertinent Vitals/Pain Pain Assessment Pain Assessment: Faces Faces Pain Scale: Hurts little more Pain Location: buttocks Pain Descriptors / Indicators: Grimacing, Guarding, Discomfort Pain Intervention(s): Repositioned     Extremity/Trunk Assessment Upper Extremity Assessment Upper Extremity Assessment: Generalized weakness   Lower Extremity Assessment Lower Extremity Assessment: Defer to PT evaluation  LLE Sensation: history of peripheral neuropathy;decreased light touch   Cervical / Trunk Assessment Cervical / Trunk Assessment: Normal   Communication Communication Communication: No apparent difficulties   Cognition Arousal: Lethargic Behavior During Therapy: WFL for tasks assessed/performed Cognition: Difficult to assess Difficult to assess due to: Level of arousal                             Following commands: Intact       Cueing  General Comments    Cueing Techniques: Verbal cues      Exercises     Shoulder Instructions      Home Living Family/patient expects to be discharged to:: Private residence Living Arrangements: Children (daughter and grandson) Available Help at Discharge: Family;Available 24 hours/day Type of Home: House Home Access: Stairs to enter [employer] of Steps: 4 Entrance Stairs-Rails: Right;Left;Can reach both Home Layout: One level     Bathroom Shower/Tub: Chief Strategy Officer: Standard Bathroom Accessibility: Yes   Home Equipment: Grab bars - tub/shower;Cane - single point          Prior Functioning/Environment Prior Level of Function : Independent/Modified Independent;Driving;Working/employed             Mobility Comments: Ind with no AD ADLs Comments: Was working as a Designer, fashion/clothing up till 3 weeks prior, drives    OT Problem List: Decreased strength;Decreased activity tolerance;Impaired balance (sitting and/or standing);Decreased knowledge of use of DME or AE;Decreased knowledge of precautions;Cardiopulmonary status limiting activity;Pain   OT Treatment/Interventions: Self-care/ADL training;DME and/or AE instruction;Energy conservation;Therapeutic activities;Patient/family education;Balance training      OT Goals(Current goals can be found in the care plan section)   Acute Rehab OT Goals OT Goal Formulation: With patient Time For Goal Achievement: 01/13/24 Potential to Achieve Goals: Good   OT Frequency:  Min 2X/week    Co-evaluation              AM-PAC OT "6 Clicks" Daily Activity     Outcome Measure Help from another person eating meals?: A Little Help from another person taking care of personal grooming?: A Little Help from another person toileting, which includes using toliet, bedpan, or urinal?: Total Help from another person bathing (including washing, rinsing, drying)?: A Lot Help from another person to put on and taking off regular upper body  clothing?: A Lot Help from another person to put on and taking off regular lower body clothing?: Total 6 Click Score: 12   End of Session    Activity Tolerance: Patient limited by fatigue;Patient limited by lethargy Patient left: in bed;with call bell/phone within reach;with family/visitor present  OT Visit Diagnosis: Muscle weakness (generalized) (M62.81);Pain;Dizziness and giddiness (R42)                Time: 1551-1610 OT Time Calculation (min): 19 min Charges:  OT General Charges $OT Visit: 1 Visit OT Evaluation $OT Eval Moderate Complexity: 1 Mod  Avanell Leigh, OTR/L Acute Rehabilitation Services Office: [phone]   Jonette Nestle 12/30/2023, 4:21 PM

## 2023-12-30 NOTE — Evaluation (Signed)
 Physical Therapy Evaluation Patient Details Name: Scott Donaldson MRN: [id number] DOB: [birth year] Today's Date: 12/30/2023  History of Present Illness  73 y.o. male presents to [facility] 12/25/23 with chest pain and admitted for unstable angina. 6/2 L heart cath. 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 Extubated 6/4.  PMHx: CAD (NSTEMI in 2023), PCI to Lcx), HTN, T2DM, & HLD  Clinical Impression  Pt in bed upon arrival and agreeable to PT eval. PTA, pt was independent for mobility with no AD. Pt was limited in today's session by feeling dizzy/nauseous after moving into seated position. No relief of symptoms while seated on the EOB after 3 minutes with return to supine. After laying in supine for ~5 minutes, pt reported less dizziness, RN notified. For bed mobility, pt required ModA for trunk and LE management. Upon d/c home, pt will have 24/7 physical assist available. Anticipate pt will progress well with management of symptoms. Recommending HHPT at this time with potential for only needing cardiac rehab. Will continue to follow for DME recommendations. Pt would benefit from acute skilled PT with current functional limitations listed below (see PT Problem List).   Supine BP: 139/65 Seated BP: 146/68 Supine BP: 155/74 HR 75-78 BPM 95% SpO2 on RA         If plan is discharge home, recommend the following: A lot of help with walking and/or transfers;A lot of help with bathing/dressing/bathroom;Assist for transportation;Help with stairs or ramp for entrance;Assistance with cooking/housework   Can travel by private vehicle    TBD    Equipment Recommendations  (TBD, might need RW)     Functional Status Assessment Patient has had a recent decline in their functional status and demonstrates the ability to make significant improvements in function in a reasonable and predictable amount of time.     Precautions / Restrictions Precautions Precautions: Sternal;Fall Precaution Booklet Issued: Yes (comment) Recall of  Precautions/Restrictions: Impaired Restrictions Other Position/Activity Restrictions: sternal      Mobility  Bed Mobility Overal bed mobility: Needs Assistance Bed Mobility: Rolling, Sidelying to Sit, Sit to Sidelying Rolling: Min assist Sidelying to sit: Mod assist, HOB elevated     Sit to sidelying: Mod assist General bed mobility comments: MinA to roll with ModA to raise trunk. Able to scoot forwards without using UE. ModA to return to supine for LE management    Transfers    General transfer comment: Deferred 2/2 unresolved dizziness      Balance Overall balance assessment: Needs assistance, Mild deficits observed, not formally tested Sitting-balance support: No upper extremity supported, Feet supported Sitting balance-Leahy Scale: Fair Sitting balance - Comments: Able to sit for ~3 minutes before needing to return to supine for symptoms of dizziness/nausea       Pertinent Vitals/Pain Pain Assessment Pain Assessment: Faces Faces Pain Scale: Hurts little more Pain Location: sternum Pain Descriptors / Indicators: Aching, Discomfort Pain Intervention(s): Limited activity within patient's tolerance, Monitored during session, Repositioned    Home Living Family/patient expects to be discharged to:: Private residence Living Arrangements: Children (daughter and grandson) Available Help at Discharge: Family;Available 24 hours/day Type of Home: House Home Access: Stairs to enter Entrance Stairs-Rails: Right;Left;Can reach both Entrance Stairs-Number of Steps: 4   Home Layout: One level Home Equipment: Grab bars - tub/shower;Cane - single point      Prior Function Prior Level of Function : Independent/Modified Independent;Driving    Mobility Comments: Ind with no AD ADLs Comments: Was working up till 3 weeks prior, drives     Extremity/Trunk Assessment  Upper Extremity Assessment Upper Extremity Assessment: Defer to OT evaluation    Lower Extremity  Assessment Lower Extremity Assessment: LLE deficits/detail LLE Sensation: history of peripheral neuropathy;decreased light touch    Cervical / Trunk Assessment Cervical / Trunk Assessment: Normal  Communication   Communication Communication: No apparent difficulties    Cognition Arousal: Alert Behavior During Therapy: WFL for tasks assessed/performed   PT - Cognitive impairments: No apparent impairments    Following commands: Intact       Cueing Cueing Techniques: Verbal cues      PT Assessment Patient needs continued PT services  PT Problem List Decreased activity tolerance;Decreased mobility;Decreased balance;Decreased knowledge of precautions       PT Treatment Interventions Gait training;DME instruction;Stair training;Functional mobility training;Therapeutic activities;Therapeutic exercise;Balance training;Neuromuscular re-education;Patient/family education    PT Goals (Current goals can be found in the Care Plan section)  Acute Rehab PT Goals Patient Stated Goal: to be able to walk PT Goal Formulation: With patient/family Time For Goal Achievement: 01/13/24 Potential to Achieve Goals: Good    Frequency Min 2X/week        AM-PAC PT "6 Clicks" Mobility  Outcome Measure Help needed turning from your back to your side while in a flat bed without using bedrails?: A Little Help needed moving from lying on your back to sitting on the side of a flat bed without using bedrails?: A Lot Help needed moving to and from a bed to a chair (including a wheelchair)?: A Lot Help needed standing up from a chair using your arms (e.g., wheelchair or bedside chair)?: A Lot Help needed to walk in hospital room?: A Lot Help needed climbing 3-5 steps with a railing? : A Lot 6 Click Score: 13    End of Session   Activity Tolerance: Other (comment) (limited by dizziness/nausea) Patient left: in bed;with call bell/phone within reach;with family/visitor present Nurse Communication:  Mobility status;Other (comment) (symptoms) PT Visit Diagnosis: Unsteadiness on feet (R26.81);Other abnormalities of gait and mobility (R26.89)    Time: 1610-9604 PT Time Calculation (min) (ACUTE ONLY): 32 min   Charges:   PT Evaluation $PT Eval Low Complexity: 1 Low PT Treatments $Therapeutic Activity: 8-22 mins PT General Charges $$ ACUTE PT VISIT: 1 Visit       Orysia Blas, PT, DPT Secure Chat Preferred  Rehab Office [phone]   Alissa April Adela Ades 12/30/2023, 1:30 PM

## 2023-12-30 NOTE — Progress Notes (Signed)
 Page to Randa Burton, PA-C overnight Retaining urine, I/O cath x1

## 2023-12-30 NOTE — Plan of Care (Signed)
  Problem: Education: Goal: Knowledge of General Education information will improve Description: Including pain rating scale, medication(s)/side effects and non-pharmacologic comfort measures Outcome: Progressing   Problem: Health Behavior/Discharge Planning: Goal: Ability to manage health-related needs will improve Outcome: Progressing   Problem: Elimination: Goal: Will not experience complications related to bowel motility Outcome: Progressing Goal: Will not experience complications related to urinary retention Outcome: Progressing   Problem: Pain Managment: Goal: General experience of comfort will improve and/or be controlled Outcome: Progressing   Problem: Activity: Goal: Ability to return to baseline activity level will improve Outcome: Progressing   Problem: Cardiovascular: Goal: Ability to achieve and maintain adequate cardiovascular perfusion will improve Outcome: Progressing   Problem: Cardiac: Goal: Will achieve and/or maintain hemodynamic stability Outcome: Progressing   Problem: Clinical Measurements: Goal: Postoperative complications will be avoided or minimized Outcome: Progressing   Problem: Urinary Elimination: Goal: Ability to achieve and maintain adequate renal perfusion and functioning will improve Outcome: Progressing

## 2023-12-31 ENCOUNTER — Inpatient Hospital Stay (HOSPITAL_COMMUNITY)

## 2023-12-31 LAB — BASIC METABOLIC PANEL WITH GFR
Anion gap: 5 (ref 5–15)
BUN: 32 mg/dL — ABNORMAL HIGH (ref 8–23)
CO2: 24 mmol/L (ref 22–32)
Calcium: 7.6 mg/dL — ABNORMAL LOW (ref 8.9–10.3)
Chloride: 106 mmol/L (ref 98–111)
Creatinine, Ser: 1.22 mg/dL (ref 0.61–1.24)
GFR, Estimated: >60 mL/min (ref >60)
Glucose, Bld: 203 mg/dL — ABNORMAL HIGH (ref 70–99)
Potassium: 3.6 mmol/L (ref 3.5–5.1)
Sodium: 135 mmol/L (ref 135–145)

## 2023-12-31 LAB — GLUCOSE, CAPILLARY
Glucose-Capillary: 140 mg/dL — ABNORMAL HIGH (ref 70–99)
Glucose-Capillary: 197 mg/dL — ABNORMAL HIGH (ref 70–99)
Glucose-Capillary: 233 mg/dL — ABNORMAL HIGH (ref 70–99)
Glucose-Capillary: 240 mg/dL — ABNORMAL HIGH (ref 70–99)
Glucose-Capillary: 269 mg/dL — ABNORMAL HIGH (ref 70–99)

## 2023-12-31 LAB — CBC
HCT: 22.7 % — ABNORMAL LOW (ref 39.0–52.0)
Hemoglobin: 7.8 g/dL — ABNORMAL LOW (ref 13.0–17.0)
MCH: 30.6 pg (ref 26.0–34.0)
MCHC: 34.4 g/dL (ref 30.0–36.0)
MCV: 89 fL (ref 80.0–100.0)
Platelets: 148 10*3/uL — ABNORMAL LOW (ref 150–400)
RBC: 2.55 MIL/uL — ABNORMAL LOW (ref 4.22–5.81)
RDW: 13 % (ref 11.5–15.5)
WBC: 4.6 10*3/uL (ref 4.0–10.5)
nRBC: 0 % (ref 0.0–0.2)

## 2023-12-31 MED ORDER — INSULIN ASPART 100 UNIT/ML IJ SOLN
0.0000 [IU] | Freq: Every day | INTRAMUSCULAR | Status: DC
  Administered 2023-12-31: 2 [IU] via SUBCUTANEOUS
  Administered 2023-12-31: 3 [IU] via SUBCUTANEOUS
  Administered 2024-01-01: 4 [IU] via SUBCUTANEOUS

## 2023-12-31 MED ORDER — INSULIN ASPART 100 UNIT/ML IJ SOLN
0.0000 [IU] | Freq: Three times a day (TID) | INTRAMUSCULAR | Status: DC
  Administered 2023-12-31: 5 [IU] via SUBCUTANEOUS
  Administered 2023-12-31: 3 [IU] via SUBCUTANEOUS
  Administered 2023-12-31: 2 [IU] via SUBCUTANEOUS
  Administered 2024-01-01 (×2): 5 [IU] via SUBCUTANEOUS
  Administered 2024-01-01: 11 [IU] via SUBCUTANEOUS
  Administered 2024-01-02: 8 [IU] via SUBCUTANEOUS
  Administered 2024-01-02: 11 [IU] via SUBCUTANEOUS
  Administered 2024-01-02: 15 [IU] via SUBCUTANEOUS

## 2023-12-31 NOTE — Progress Notes (Signed)
 Physical Therapy Treatment Patient Details Name: Scott Donaldson MRN: [id number] DOB: [birth year] Today's Date: 12/31/2023   History of Present Illness 73 y.o. male presents to [facility] 12/25/23 with chest pain and admitted for unstable angina. 6/2 L heart cath. 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 Extubated 6/4.  PMHx: CAD (NSTEMI in 2023), PCI to Lcx), HTN, T2DM, HLD, vertigo.    PT Comments  Patient reporting feeling better today and denied dizziness throughout entire session. Able to progress to standing (+2 min assist) and walked 50 ft with RW and min assist. Patient does have 4 steps to enter home. Will plan to see 6/8 with goal of stair training.    If plan is discharge home, recommend the following: Assist for transportation;Help with stairs or ramp for entrance;Assistance with cooking/housework;A little help with walking and/or transfers;A little help with bathing/dressing/bathroom   Can travel by private vehicle        Equipment Recommendations  Rolling walker (2 wheels)    Recommendations for Other Services       Precautions / Restrictions Precautions Precautions: Sternal;Fall Precaution Booklet Issued: Yes (comment) Recall of Precautions/Restrictions: Impaired Precaution/Restrictions Comments: able to state precautions; cues to adhere     Mobility  Bed Mobility Overal bed mobility: Needs Assistance Bed Mobility: Rolling, Sidelying to Sit Rolling: Min assist Sidelying to sit: HOB elevated, Min assist       General bed mobility comments: cues for technique; min assist to initiate rolling, pt able to move legs over EOB and initiate raising torso with assist to finish    Transfers Overall transfer level: Needs assistance Equipment used: Rolling walker (2 wheels) Transfers: Sit to/from Stand Sit to Stand: Min assist, +2 physical assistance, From elevated surface           General transfer comment: pt denied dizziness; elevated bed to simulate home environment     Ambulation/Gait Ambulation/Gait assistance: Min assist Gait Distance (Feet): 50 Feet Assistive device: Rolling walker (2 wheels) Gait Pattern/deviations: Step-through pattern, Decreased stride length, Trunk flexed   Gait velocity interpretation: 1.31 - 2.62 ft/sec, indicative of limited community ambulator   General Gait Details: slow, cautious gait with cues for proximity to RW and for turning inside [employer]             Wheelchair Mobility     Tilt Bed    Modified Rankin (Stroke Patients Only)       Balance Overall balance assessment: Needs assistance, Mild deficits observed, not formally tested Sitting-balance support: No upper extremity supported, Feet supported Sitting balance-Leahy Scale: Fair     Standing balance support: Bilateral upper extremity supported, Reliant on assistive device for balance Standing balance-Leahy Scale: Poor                              Communication Communication Communication: No apparent difficulties  Cognition Arousal: Alert Behavior During Therapy: WFL for tasks assessed/performed   PT - Cognitive impairments: No apparent impairments                         Following commands: Intact      Cueing Cueing Techniques: Verbal cues  Exercises      General Comments General comments (skin integrity, edema, etc.): Family member present and very encouraging.      Pertinent Vitals/Pain Pain Assessment Pain Assessment: Faces Faces Pain Scale: Hurts little more Pain Location: chest (with coughing) Pain Descriptors / Indicators: Grimacing,  Guarding, Discomfort Pain Intervention(s): Limited activity within patient's tolerance, Monitored during session, Repositioned    Home Living                          Prior Function            PT Goals (current goals can now be found in the care plan section) Acute Rehab PT Goals Patient Stated Goal: to be able to walk Time For Goal Achievement:  01/13/24 Potential to Achieve Goals: Good Progress towards PT goals: Progressing toward goals    Frequency    Min 2X/week      PT Plan      Co-evaluation              AM-PAC PT "6 Clicks" Mobility   Outcome Measure  Help needed turning from your back to your side while in a flat bed without using bedrails?: A Little Help needed moving from lying on your back to sitting on the side of a flat bed without using bedrails?: A Little Help needed moving to and from a bed to a chair (including a wheelchair)?: A Lot Help needed standing up from a chair using your arms (e.g., wheelchair or bedside chair)?: A Lot Help needed to walk in hospital room?: A Little Help needed climbing 3-5 steps with a railing? : A Little 6 Click Score: 16    End of Session Equipment Utilized During Treatment: Gait belt Activity Tolerance: Patient tolerated treatment well Patient left: with call bell/phone within reach;with family/visitor present;in chair Nurse Communication: Mobility status PT Visit Diagnosis: Unsteadiness on feet (R26.81);Other abnormalities of gait and mobility (R26.89)     Time: 8119-1478 PT Time Calculation (min) (ACUTE ONLY): 18 min  Charges:    $Gait Training: 8-22 mins PT General Charges $$ ACUTE PT VISIT: 1 Visit                      Gayle Kava, PT Acute Rehabilitation Services  Office [phone]    Guilford Leep 12/31/2023, 9:38 AM

## 2023-12-31 NOTE — Plan of Care (Signed)
  Problem: Clinical Measurements: Goal: Will remain free from infection Outcome: Progressing   Problem: Clinical Measurements: Goal: Diagnostic test results will improve Outcome: Progressing   Problem: Coping: Goal: Level of anxiety will decrease Outcome: Progressing   Problem: Safety: Goal: Ability to remain free from injury will improve Outcome: Progressing   Problem: Activity: Goal: Ability to return to baseline activity level will improve Outcome: Progressing   Problem: Cardiovascular: Goal: Ability to achieve and maintain adequate cardiovascular perfusion will improve Outcome: Progressing Goal: Vascular access site(s) Level 0-1 will be maintained Outcome: Progressing   Problem: Education: Goal: Knowledge of disease or condition will improve Outcome: Progressing   Problem: Education: Goal: Knowledge of the prescribed therapeutic regimen will improve Outcome: Progressing   Problem: Activity: Goal: Risk for activity intolerance will decrease Outcome: Progressing

## 2023-12-31 NOTE — Progress Notes (Signed)
 CARDIAC REHAB PHASE I   Pt and son educated on restrictions, site care, sternal precautions, exercise guidelines, HH nutrition, and CRP2 referral. Referral to be placed to [facility]. Pt and son asking about DME for home, wanting to get shower chair and walker. Pt has recliner to sleep in and son for transportation. Anticipating pt will have home health PT and OT. All questions answered, pt left in bed with call bell and family present.  [id number]   Arlander Labrum, MS, ACSM-CEP 12/31/2023 10:26 AM

## 2023-12-31 NOTE — Progress Notes (Signed)
 [address]            [employer] [address]          [phone]   4 Days Post-Op Procedure(s) (LRB): CORONARY ARTERY BYPASS GRAFTING (CABG) TIMES THREE USING LEFT INTERNAL MAMMARY ARTERY AND ENDOSCOPICALLY HARVESTED RIGHT GREATER SAPHENOUS VEIN (N/A) ECHOCARDIOGRAM, TRANSESOPHAGEAL (N/A)  Total Length of Stay:  LOS: 4 days   Subjective: Sitting up in bed.  Said he was unable to walk yesterday due to weakness in his legs.  Pain controlled.  Not much appetite. Passing gas, no BM yet.   Objective: Vital signs in last 24 hours: Temp:  [98.1 F (36.7 C)-99.3 F (37.4 C)] 98.6 F (37 C) (06/07 0833) Pulse Rate:  [64-89] 89 (06/07 0834) Cardiac Rhythm: Normal sinus rhythm (06/06 1900) Resp:  [13-20] 15 (06/07 0833) BP: (100-149)/(52-70) 149/70 (06/07 0834) SpO2:  [93 %-96 %] 95 % (06/07 0833) Weight:  [89.2 kg] 89.2 kg (06/06 2358)  Filed Weights   12/29/23 0500 12/30/23 0500 12/30/23 2358  Weight: 83.1 kg 85.3 kg 89.2 kg    Weight change: 3.9 kg     Intake/Output from previous day: 06/06 0701 - 06/07 0700 In: -  Out: 700 [Urine:700]  Intake/Output this shift: No intake/output data recorded.  Current Meds: Scheduled Meds:  acetaminophen   1,000 mg Oral Q6H   Or   acetaminophen  (TYLENOL ) oral liquid 160 mg/5 mL  1,000 mg Per Tube Q6H   aspirin  EC  325 mg Oral Daily   Or   aspirin   324 mg Per Tube Daily   atorvastatin   80 mg Oral Daily   bisacodyl   10 mg Oral Daily   Or   bisacodyl   10 mg Rectal Daily   Chlorhexidine  Gluconate Cloth  6 each Topical Daily   docusate sodium   200 mg Oral Daily   enoxaparin  (LOVENOX ) injection  40 mg Subcutaneous Q24H   feeding supplement  237 mL Oral BID BM   fenofibrate   160 mg Oral Daily   gabapentin   200 mg Oral TID   insulin  aspart  0-15 Units Subcutaneous TID WC   insulin  aspart  0-5 Units Subcutaneous QHS   lidocaine   1 patch Transdermal Q24H   meclizine   25 mg Oral TID   methocarbamol    500 mg Oral TID   metoprolol  tartrate  12.5 mg Oral BID   pantoprazole   40 mg Oral Daily   tamsulosin   0.4 mg Oral Daily   Continuous Infusions:  albumin  human Stopped (12/28/23 0018)   insulin  Stopped (12/28/23 1205)   PRN Meds:.albumin  human, dextrose , metoprolol  tartrate, morphine  injection, ondansetron  (ZOFRAN ) IV, mouth rinse, oxyCODONE , traMADol   General appearance: alert, cooperative, and no distress Neurologic: intact Heart: RRR, NSR on monitor.  Lungs: breath sounds clear, shallow.  Abdomen: soft, no tenderness, few bowel sounds Extremities: all well perfused, mild LE edema. The RLE incisions are dry. Wound: the sternotomy incision is covered with a dry Aquacel dressing.   Lab Results: CBC: Recent Labs    12/30/23 0353 12/31/23 0305  WBC 7.2 4.6  HGB 8.5* 7.8*  HCT 25.9* 22.7*  PLT 138* 148*   BMET:  Recent Labs    12/30/23 0353 12/31/23 0305  NA 136 135  K 3.8 3.6  CL 104 106  CO2 23 24  GLUCOSE 108* 203*  BUN 30* 32*  CREATININE 1.37* 1.22  CALCIUM  8.1* 7.6*  CMET: Lab Results  Component Value Date   WBC 4.6 12/31/2023   HGB 7.8 (L) 12/31/2023   HCT 22.7 (L) 12/31/2023   PLT 148 (L) 12/31/2023   GLUCOSE 203 (H) 12/31/2023   CHOL 277 (H) 12/26/2023   TRIG 403 (H) 12/26/2023   HDL 37 (L) 12/26/2023   LDLDIRECT 186 (H) 12/26/2023   LDLCALC UNABLE TO CALCULATE IF TRIGLYCERIDE OVER 400 mg/dL 78/29/5621   ALT 18 30/86/5784   AST 17 10/22/2021   NA 135 12/31/2023   K 3.6 12/31/2023   CL 106 12/31/2023   CREATININE 1.22 12/31/2023   BUN 32 (H) 12/31/2023   CO2 24 12/31/2023   TSH 2.01 11/16/2021   PSA 11.88 (H) 07/23/2019   INR 1.4 (H) 12/27/2023   HGBA1C 13.3 (H) 12/25/2023   MICROALBUR 40.5 (H) 12/05/2019      PT/INR:  No results for input(s): "LABPROT", "INR" in the last 72 hours.  Radiology: No results found.   Assessment/Plan: S/P Procedure(s) (LRB): CORONARY ARTERY BYPASS GRAFTING (CABG) TIMES THREE USING LEFT INTERNAL  MAMMARY ARTERY AND ENDOSCOPICALLY HARVESTED RIGHT GREATER SAPHENOUS VEIN (N/A) ECHOCARDIOGRAM, TRANSESOPHAGEAL (N/A)  -POD4 CABG x 3 after presenting with angina and new LBBB. H/O NSTEMI and PCI in 2023. Has preserved biventricular function. Stable VS and cardiac rhythm. Continue ASA, statin, BB. Advance activity. Continue PT /OT  -GI: Nausea / Vertigo. Resolved.  -ENDO: type 2 DM with poor control PTA.  HgbA1C 13.  Fair glucose control on Semglee  12 u BID.  Still just taking clear liquids.  Continue to monitor and provide SSI.   -RENAL: small bump in Creat 1.38 yesterday but appears to be returning to baseline today. Monitoring. Avoid hypotension.   -PULM: good oxygenation on RA. Continue to work on [employer]. .  -Neuro: intact grossly.  His h/o peripheral neuropathy that seems to be slowing progress with mobility.    Continue PT and OT  -DVT PPX- ambulate, daily enoxaparin  SQ.   -Disposition: Can toward eventual discharge home with home health PT and OT.  Alexxia Stankiewicz G. Akeisha Lagerquist, PA-C 12/31/2023 8:59 AM    Agree Dispo planning  Hilarie Lovely

## 2024-01-01 LAB — GLUCOSE, CAPILLARY
Glucose-Capillary: 330 mg/dL — ABNORMAL HIGH (ref 70–99)
Glucose-Capillary: 255 mg/dL — ABNORMAL HIGH (ref 70–99)
Glucose-Capillary: 226 mg/dL — ABNORMAL HIGH (ref 70–99)
Glucose-Capillary: 315 mg/dL — ABNORMAL HIGH (ref 70–99)

## 2024-01-01 LAB — BASIC METABOLIC PANEL WITH GFR
Anion gap: 10 (ref 5–15)
BUN: 28 mg/dL — ABNORMAL HIGH (ref 8–23)
CO2: 24 mmol/L (ref 22–32)
Calcium: 8.3 mg/dL — ABNORMAL LOW (ref 8.9–10.3)
Chloride: 106 mmol/L (ref 98–111)
Creatinine, Ser: 1.14 mg/dL (ref 0.61–1.24)
GFR, Estimated: >60 mL/min (ref >60)
Glucose, Bld: 281 mg/dL — ABNORMAL HIGH (ref 70–99)
Potassium: 4.3 mmol/L (ref 3.5–5.1)
Sodium: 140 mmol/L (ref 135–145)

## 2024-01-01 MED ORDER — INSULIN GLARGINE-YFGN 100 UNIT/ML ~~LOC~~ SOLN
18.0000 [IU] | Freq: Two times a day (BID) | SUBCUTANEOUS | Status: DC
  Administered 2024-01-01 – 2024-01-03 (×5): 18 [IU] via SUBCUTANEOUS
  Filled 2024-01-01 (×7): qty 0.18

## 2024-01-01 NOTE — Progress Notes (Signed)
 Physical Therapy Treatment Patient Details Name: Scott Donaldson MRN: [id number] DOB: April 20, 1951 Today's Date: 01/01/2024   History of Present Illness 73 y.o. male presents to [facility] 12/25/23 with chest pain and admitted for unstable angina. 6/2 L heart cath. 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 Extubated 6/4.  PMHx: CAD (NSTEMI in 2023), PCI to Lcx), HTN, T2DM, HLD, vertigo.    PT Comments  Patient up in chair for several hours on arrival. Eager to work on his walking. Pt increased to 100 ft with RW and CGA (occasional cues to not lift RW and proximity to RW). Assisted up/down 1 step using bed rail and HHA on opposite side with min assist--pt reporting incr chest pain after ascending with ?too much force put through LUE. Patient has 4 steps to enter home and will need additional stair training.    If plan is discharge home, recommend the following: Assist for transportation;Help with stairs or ramp for entrance;Assistance with cooking/housework;A little help with walking and/or transfers;A little help with bathing/dressing/bathroom   Can travel by private vehicle        Equipment Recommendations  Rolling walker (2 wheels)    Recommendations for Other Services       Precautions / Restrictions Precautions Precautions: Sternal;Fall Precaution Booklet Issued: Yes (comment) Recall of Precautions/Restrictions: Impaired Precaution/Restrictions Comments: able to state precautions; cues to adhere     Mobility  Bed Mobility Overal bed mobility: Needs Assistance Bed Mobility: Sit to Sidelying         Sit to sidelying: Min assist General bed mobility comments: no cues for technique; min assist to lead leg up to bed    Transfers Overall transfer level: Needs assistance Equipment used: Rolling walker (2 wheels) Transfers: Sit to/from Stand Sit to Stand: Min assist           General transfer comment: from recliner, bed at lowest height; assist for anterior wt-shift    Ambulation/Gait Ambulation/Gait  assistance: Contact guard assist Gait Distance (Feet): 100 Feet Assistive device: Rolling walker (2 wheels) Gait Pattern/deviations: Step-through pattern, Decreased stride length, Trunk flexed   Gait velocity interpretation: 1.31 - 2.62 ft/sec, indicative of limited community ambulator   General Gait Details: slow, cautious gait with cues for proximity to RW and for turning inside RW   Stairs Stairs: Yes Stairs assistance: Min assist Stair Management: One rail Left, Step to pattern, Forwards (+HHA on right) Number of Stairs: 1 General stair comments: pt reported feeling pain in his chest with stepping up (?put excessive weight through LUE, very light pressure noted thru RUE)   Wheelchair Mobility     Tilt Bed    Modified Rankin (Stroke Patients Only)       Balance Overall balance assessment: Needs assistance, Mild deficits observed, not formally tested Sitting-balance support: No upper extremity supported, Feet supported Sitting balance-Leahy Scale: Fair     Standing balance support: Bilateral upper extremity supported, Reliant on assistive device for balance Standing balance-Leahy Scale: Poor                              Communication Communication Communication: No apparent difficulties  Cognition Arousal: Alert Behavior During Therapy: WFL for tasks assessed/performed   PT - Cognitive impairments: No apparent impairments                         Following commands: Intact      Cueing Cueing Techniques: Verbal cues  Exercises  General Comments        Pertinent Vitals/Pain Pain Assessment Pain Assessment: Faces Faces Pain Scale: Hurts even more Pain Location: chest (with step up) Pain Descriptors / Indicators: Grimacing, Guarding, Discomfort, Moaning Pain Intervention(s): Limited activity within patient's tolerance, Monitored during session    Home Living                          Prior Function            PT  Goals (current goals can now be found in the care plan section) Acute Rehab PT Goals Patient Stated Goal: to be able to walk PT Goal Formulation: With patient/family Time For Goal Achievement: 01/13/24 Potential to Achieve Goals: Good Progress towards PT goals: Progressing toward goals    Frequency    Min 2X/week      PT Plan      Co-evaluation              AM-PAC PT "6 Clicks" Mobility   Outcome Measure  Help needed turning from your back to your side while in a flat bed without using bedrails?: A Little Help needed moving from lying on your back to sitting on the side of a flat bed without using bedrails?: A Little Help needed moving to and from a bed to a chair (including a wheelchair)?: A Little Help needed standing up from a chair using your arms (e.g., wheelchair or bedside chair)?: A Little Help needed to walk in hospital room?: A Little Help needed climbing 3-5 steps with a railing? : A Little 6 Click Score: 18    End of Session Equipment Utilized During Treatment: Gait belt Activity Tolerance: Patient tolerated treatment well Patient left: with call bell/phone within reach;with family/visitor present;in bed Nurse Communication: Mobility status PT Visit Diagnosis: Unsteadiness on feet (R26.81);Other abnormalities of gait and mobility (R26.89)     Time: 6045-4098 PT Time Calculation (min) (ACUTE ONLY): 18 min  Charges:    $Gait Training: 8-22 mins PT General Charges $$ ACUTE PT VISIT: 1 Visit                      Gayle Kava, PT Acute Rehabilitation Services  Office [phone]    Guilford Leep 01/01/2024, 12:21 PM

## 2024-01-01 NOTE — Plan of Care (Signed)
  Problem: Education: Goal: Knowledge of General Education information will improve Description: Including pain rating scale, medication(s)/side effects and non-pharmacologic comfort measures Outcome: Progressing   Problem: Health Behavior/Discharge Planning: Goal: Ability to manage health-related needs will improve Outcome: Progressing   Problem: Clinical Measurements: Goal: Ability to maintain clinical measurements within normal limits will improve Outcome: Progressing Goal: Will remain free from infection Outcome: Progressing Goal: Diagnostic test results will improve Outcome: Progressing Goal: Respiratory complications will improve Outcome: Progressing Goal: Cardiovascular complication will be avoided Outcome: Progressing   Problem: Activity: Goal: Risk for activity intolerance will decrease Outcome: Progressing   Problem: Nutrition: Goal: Adequate nutrition will be maintained Outcome: Progressing   Problem: Coping: Goal: Level of anxiety will decrease Outcome: Progressing   Problem: Elimination: Goal: Will not experience complications related to bowel motility Outcome: Progressing Goal: Will not experience complications related to urinary retention Outcome: Progressing   Problem: Pain Managment: Goal: General experience of comfort will improve and/or be controlled Outcome: Progressing   Problem: Safety: Goal: Ability to remain free from injury will improve Outcome: Progressing   Problem: Skin Integrity: Goal: Risk for impaired skin integrity will decrease Outcome: Progressing   Problem: Education: Goal: Understanding of CV disease, CV risk reduction, and recovery process will improve Outcome: Progressing   Problem: Activity: Goal: Ability to return to baseline activity level will improve Outcome: Progressing   Problem: Cardiovascular: Goal: Ability to achieve and maintain adequate cardiovascular perfusion will improve Outcome: Progressing Goal:  Vascular access site(s) Level 0-1 will be maintained Outcome: Progressing   Problem: Health Behavior/Discharge Planning: Goal: Ability to safely manage health-related needs after discharge will improve Outcome: Progressing   Problem: Education: Goal: Will demonstrate proper wound care and an understanding of methods to prevent future damage Outcome: Progressing Goal: Knowledge of disease or condition will improve Outcome: Progressing Goal: Knowledge of the prescribed therapeutic regimen will improve Outcome: Progressing   Problem: Activity: Goal: Risk for activity intolerance will decrease Outcome: Progressing   Problem: Cardiac: Goal: Will achieve and/or maintain hemodynamic stability Outcome: Progressing   Problem: Clinical Measurements: Goal: Postoperative complications will be avoided or minimized Outcome: Progressing   Problem: Respiratory: Goal: Respiratory status will improve Outcome: Progressing   Problem: Skin Integrity: Goal: Wound healing without signs and symptoms of infection Outcome: Progressing Goal: Risk for impaired skin integrity will decrease Outcome: Progressing   Problem: Urinary Elimination: Goal: Ability to achieve and maintain adequate renal perfusion and functioning will improve Outcome: Progressing   Problem: Education: Goal: Ability to describe self-care measures that may prevent or decrease complications (Diabetes Survival Skills Education) will improve Outcome: Progressing Goal: Individualized Educational Video(s) Outcome: Progressing   Problem: Coping: Goal: Ability to adjust to condition or change in health will improve Outcome: Progressing   Problem: Fluid Volume: Goal: Ability to maintain a balanced intake and output will improve Outcome: Progressing   Problem: Health Behavior/Discharge Planning: Goal: Ability to identify and utilize available resources and services will improve Outcome: Progressing Goal: Ability to manage  health-related needs will improve Outcome: Progressing   Problem: Metabolic: Goal: Ability to maintain appropriate glucose levels will improve Outcome: Progressing   Problem: Nutritional: Goal: Maintenance of adequate nutrition will improve Outcome: Progressing Goal: Progress toward achieving an optimal weight will improve Outcome: Progressing   Problem: Skin Integrity: Goal: Risk for impaired skin integrity will decrease Outcome: Progressing   Problem: Tissue Perfusion: Goal: Adequacy of tissue perfusion will improve Outcome: Progressing

## 2024-01-01 NOTE — Progress Notes (Addendum)
 [address]            [place] [place] [address]          [phone]   5 Days Post-Op Procedure(s) (LRB): CORONARY ARTERY BYPASS GRAFTING (CABG) TIMES THREE USING LEFT INTERNAL MAMMARY ARTERY AND ENDOSCOPICALLY HARVESTED RIGHT GREATER SAPHENOUS VEIN (N/A) ECHOCARDIOGRAM, TRANSESOPHAGEAL (N/A)  Total Length of Stay:  LOS: 5 days   Subjective: Awake and alert.  Said he had a much better day yesterday.  Walked into his room.  On room air. Bowel movement yesterday.   Objective: Vital signs in last 24 hours: Temp:  [98 F (36.7 C)-99.1 F (37.3 C)] 98.1 F (36.7 C) (06/08 0408) Pulse Rate:  [72-89] 76 (06/08 0408) Cardiac Rhythm: Normal sinus rhythm (06/07 2048) Resp:  [15-20] 19 (06/08 0500) BP: (99-149)/(57-70) 129/65 (06/08 0408) SpO2:  [93 %-96 %] 95 % (06/08 0408) Weight:  [83.4 kg] 83.4 kg (06/08 0500)  Filed Weights   12/30/23 0500 12/30/23 2358 01/01/24 0500  Weight: 85.3 kg 89.2 kg 83.4 kg    Weight change: -5.8 kg     Intake/Output from previous day: 06/07 0701 - 06/08 0700 In: 720 [P.O.:720] Out: 775 [Urine:775]  Intake/Output this shift: No intake/output data recorded.  Current Meds: Scheduled Meds:  acetaminophen   1,000 mg Oral Q6H   Or   acetaminophen  (TYLENOL ) oral liquid 160 mg/5 mL  1,000 mg Per Tube Q6H   aspirin  EC  325 mg Oral Daily   Or   aspirin   324 mg Per Tube Daily   atorvastatin   80 mg Oral Daily   bisacodyl   10 mg Oral Daily   Or   bisacodyl   10 mg Rectal Daily   Chlorhexidine  Gluconate Cloth  6 each Topical Daily   docusate sodium   200 mg Oral Daily   enoxaparin  (LOVENOX ) injection  40 mg Subcutaneous Q24H   feeding supplement  237 mL Oral BID BM   fenofibrate   160 mg Oral Daily   gabapentin   200 mg Oral TID   insulin  aspart  0-15 Units Subcutaneous TID WC   insulin  aspart  0-5 Units Subcutaneous QHS   lidocaine   1 patch Transdermal Q24H   meclizine   25 mg Oral TID   methocarbamol   500 mg  Oral TID   metoprolol  tartrate  12.5 mg Oral BID   pantoprazole   40 mg Oral Daily   tamsulosin   0.4 mg Oral Daily   Continuous Infusions:  albumin  human Stopped (12/28/23 0018)   PRN Meds:.albumin  human, dextrose , metoprolol  tartrate, morphine  injection, ondansetron  (ZOFRAN ) IV, mouth rinse, oxyCODONE , traMADol   General appearance: alert, cooperative, and no distress Neurologic: intact Heart: RRR, NSR on monitor.  Lungs: breath sounds clear, shallow.  Abdomen: soft, no tenderness, few bowel sounds Extremities: all well perfused, mild LE edema. The RLE incisions have some mild erythema but are dry. Wound: the sternotomy incision is clean and dry.  Lab Results: CBC: Recent Labs    12/30/23 0353 12/31/23 0305  WBC 7.2 4.6  HGB 8.5* 7.8*  HCT 25.9* 22.7*  PLT 138* 148*   BMET:  Recent Labs    12/30/23 0353 12/31/23 0305  NA 136 135  K 3.8 3.6  CL 104 106  CO2 23 24  GLUCOSE 108* 203*  BUN 30* 32*  CREATININE 1.37* 1.22  CALCIUM  8.1* 7.6*    CMET: Lab Results  Component Value Date  WBC 4.6 12/31/2023   HGB 7.8 (L) 12/31/2023   HCT 22.7 (L) 12/31/2023   PLT 148 (L) 12/31/2023   GLUCOSE 203 (H) 12/31/2023   CHOL 277 (H) 12/26/2023   TRIG 403 (H) 12/26/2023   HDL 37 (L) 12/26/2023   LDLDIRECT 186 (H) 12/26/2023   LDLCALC UNABLE TO CALCULATE IF TRIGLYCERIDE OVER 400 mg/dL 40/98/1191   ALT 18 47/82/9562   AST 17 10/22/2021   NA 135 12/31/2023   K 3.6 12/31/2023   CL 106 12/31/2023   CREATININE 1.22 12/31/2023   BUN 32 (H) 12/31/2023   CO2 24 12/31/2023   TSH 2.01 11/16/2021   PSA 11.88 (H) 07/23/2019   INR 1.4 (H) 12/27/2023   HGBA1C 13.3 (H) 12/25/2023   MICROALBUR 40.5 (H) 12/05/2019      PT/INR:  No results for input(s): "LABPROT", "INR" in the last 72 hours.  Radiology: No results found.   Assessment/Plan: S/P Procedure(s) (LRB): CORONARY ARTERY BYPASS GRAFTING (CABG) TIMES THREE USING LEFT INTERNAL MAMMARY ARTERY AND ENDOSCOPICALLY  HARVESTED RIGHT GREATER SAPHENOUS VEIN (N/A) ECHOCARDIOGRAM, TRANSESOPHAGEAL (N/A)  -POD5 CABG x 3 after presenting with angina and new LBBB. H/O NSTEMI and PCI in 2023. Has preserved biventricular function. Stable VS and cardiac rhythm. Continue ASA, statin, BB.   -GI: Tolerating cardiac diet with no trouble.  Abdomen benign, bowel movement yesterday  -ENDO: type 2 DM with poor control PTA.  HgbA1C 13.  Hyperglycemic after diet advanced.  Will increase the Semglee  to 18 units subcu twice daily.  Continue to monitor and provide SSI.   -RENAL: small bump in Creat 1.38 early postop but appears to be returning to baseline.  Morning lab pending.  Monitoring. Avoid hypotension.   -PULM: good oxygenation on RA. Continue to work on [employer].  Follow-up chest x-ray in the morning.  -Neuro: intact grossly.  His h/o peripheral neuropathy that seems to be slowing progress with mobility.    Continue PT and OT  -DVT PPX- ambulate, daily enoxaparin  SQ.   -Disposition: Plan eventual discharge home with home health PT and OT.  Myron G. Roddenberry, PA-C 01/01/2024 8:21 AM  Agree Dispo planning  Hilarie Lovely

## 2024-01-02 ENCOUNTER — Inpatient Hospital Stay (HOSPITAL_COMMUNITY)

## 2024-01-02 LAB — CBC
HCT: 25.1 % — ABNORMAL LOW (ref 39.0–52.0)
Hemoglobin: 8.4 g/dL — ABNORMAL LOW (ref 13.0–17.0)
MCH: 30 pg (ref 26.0–34.0)
MCHC: 33.5 g/dL (ref 30.0–36.0)
MCV: 89.6 fL (ref 80.0–100.0)
Platelets: 206 10*3/uL (ref 150–400)
RBC: 2.8 MIL/uL — ABNORMAL LOW (ref 4.22–5.81)
RDW: 12.7 % (ref 11.5–15.5)
WBC: 4.9 10*3/uL (ref 4.0–10.5)
nRBC: 0 % (ref 0.0–0.2)

## 2024-01-02 LAB — BASIC METABOLIC PANEL WITH GFR
Anion gap: 10 (ref 5–15)
BUN: 23 mg/dL (ref 8–23)
CO2: 24 mmol/L (ref 22–32)
Calcium: 8.4 mg/dL — ABNORMAL LOW (ref 8.9–10.3)
Chloride: 104 mmol/L (ref 98–111)
Creatinine, Ser: 1.05 mg/dL (ref 0.61–1.24)
GFR, Estimated: >60 mL/min (ref >60)
Glucose, Bld: 381 mg/dL — ABNORMAL HIGH (ref 70–99)
Potassium: 4.4 mmol/L (ref 3.5–5.1)
Sodium: 138 mmol/L (ref 135–145)

## 2024-01-02 LAB — GLUCOSE, CAPILLARY
Glucose-Capillary: 316 mg/dL — ABNORMAL HIGH (ref 70–99)
Glucose-Capillary: 298 mg/dL — ABNORMAL HIGH (ref 70–99)
Glucose-Capillary: 355 mg/dL — ABNORMAL HIGH (ref 70–99)
Glucose-Capillary: 196 mg/dL — ABNORMAL HIGH (ref 70–99)

## 2024-01-02 MED ORDER — POTASSIUM CHLORIDE CRYS ER 20 MEQ PO TBCR
20.0000 meq | EXTENDED_RELEASE_TABLET | Freq: Two times a day (BID) | ORAL | Status: DC
  Administered 2024-01-02 – 2024-01-03 (×3): 20 meq via ORAL
  Filled 2024-01-02 (×3): qty 1

## 2024-01-02 MED ORDER — FUROSEMIDE 40 MG PO TABS
40.0000 mg | ORAL_TABLET | Freq: Two times a day (BID) | ORAL | Status: DC
  Administered 2024-01-02 – 2024-01-03 (×3): 40 mg via ORAL
  Filled 2024-01-02 (×3): qty 1

## 2024-01-02 MED ORDER — INSULIN ASPART 100 UNIT/ML IJ SOLN
5.0000 [IU] | Freq: Three times a day (TID) | INTRAMUSCULAR | Status: DC
  Administered 2024-01-02: 5 [IU] via SUBCUTANEOUS

## 2024-01-02 MED FILL — Lidocaine HCl Local Preservative Free (PF) Inj 2%: INTRAMUSCULAR | Qty: 14 | Status: AC

## 2024-01-02 MED FILL — Heparin Sodium (Porcine) Inj 1000 Unit/ML: Qty: 1000 | Status: AC

## 2024-01-02 MED FILL — Potassium Chloride Inj 2 mEq/ML: INTRAVENOUS | Qty: 40 | Status: AC

## 2024-01-02 NOTE — Plan of Care (Signed)
  Problem: Clinical Measurements: Goal: Will remain free from infection Outcome: Progressing   Problem: Clinical Measurements: Goal: Diagnostic test results will improve Outcome: Progressing   Problem: Clinical Measurements: Goal: Respiratory complications will improve Outcome: Progressing   Problem: Nutrition: Goal: Adequate nutrition will be maintained Outcome: Progressing   Problem: Coping: Goal: Level of anxiety will decrease Outcome: Progressing   Problem: Pain Managment: Goal: General experience of comfort will improve and/or be controlled Outcome: Progressing   Problem: Safety: Goal: Ability to remain free from injury will improve Outcome: Progressing   Problem: Activity: Goal: Ability to return to baseline activity level will improve Outcome: Progressing

## 2024-01-02 NOTE — Progress Notes (Signed)
 Occupational Therapy Treatment Patient Details Name: Scott Donaldson MRN: [id number] DOB: May 04, 1951 Today's Date: 01/02/2024   History of present illness 73 y.o. male presents to [facility] 12/25/23 with chest pain and admitted for unstable angina. 6/2 L heart cath. 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 Extubated 6/4.  PMHx: CAD (NSTEMI in 2023), PCI to Lcx), HTN, T2DM, HLD, vertigo.   OT comments  Pt currently at min assist to min guard for transfers with use of the RW for support.  Min instructional cueing to adhere to sternal precautions for bed mobility and sit to stand.  Vitals stable throughout.  Discussed need for 3:1 at home which they agree but will hold off on tub equipment until he gets more information on when he can shower and also how he progresses at home.  Discussed options for purchasing outside of the hospital.  Feel he is making steady progress with OT at this time.  Recommend continued OT for progression of independence with ADLs and continued education on energy conservation strategies.       If plan is discharge home, recommend the following:  A little help with bathing/dressing/bathroom;Assistance with cooking/housework;Assist for transportation;Help with stairs or ramp for entrance   Equipment Recommendations  BSC/3in1       Precautions / Restrictions Precautions Precautions: Sternal;Fall Precaution Booklet Issued: Yes (comment) Recall of Precautions/Restrictions: Impaired Precaution/Restrictions Comments: min instructional cueing for recalling them Restrictions Weight Bearing Restrictions Per Provider Order: No Other Position/Activity Restrictions: sternal       Mobility Bed Mobility Overal bed mobility: Needs Assistance Bed Mobility: Rolling, Supine to Sit Rolling: Supervision Sidelying to sit: HOB elevated, Supervision (HOB elevated to 19 degrees)     Sit to sidelying: Supervision General bed mobility comments: Min instructional cueing for sequencing rolling and sidelying to sit  adhering to sternal precautions.    Transfers Overall transfer level: Needs assistance Equipment used: Rolling walker (2 wheels) Transfers: Sit to/from Stand, Bed to chair/wheelchair/BSC Sit to Stand: Contact guard assist     Step pivot transfers: Contact guard assist     General transfer comment: Min assist for initial stand from bed and then min guard for repetition.     Balance Overall balance assessment: Needs assistance, Mild deficits observed, not formally tested Sitting-balance support: No upper extremity supported, Feet supported Sitting balance-Leahy Scale: Fair Sitting balance - Comments: Able to sit for ~3 minutes before needing to return to supine for symptoms of dizziness/nausea   Standing balance support: Bilateral upper extremity supported, Reliant on assistive device for balance Standing balance-Leahy Scale: Poor Standing balance comment: reliant on bilat UEs                           ADL either performed or assessed with clinical judgement   ADL Overall ADL's : Needs assistance/impaired                                 Tub/ Shower Transfer: Minimal assistance;Ambulation;Rolling walker (2 wheels) Tub/Shower Transfer Details (indicate cue type and reason): stepping over simulated edge Functional mobility during ADLs: Contact guard assist;Rolling walker (2 wheels) General ADL Comments: Reviewed sternal precautions handout and the "Five P's of Energy Conservation" with examples provided.  Pt worked on sit to stand from EOB with various heights and adhering to sternal precautions.  Also, provided education on bed mobility, rolling to the left side and pushing up on the left elbow and  then sitting up without use of the UEs.  Discussed possible need for shower seat vs bench and since patient was independent prior to this admission, will hold off and see how he progresses at home.  They were in agreement with need for 3:1 as pt has low toilet.      Communication Communication Communication: No apparent difficulties   Cognition Arousal: Alert Behavior During Therapy: WFL for tasks assessed/performed Cognition: No apparent impairments             OT - Cognition Comments: Pt needs min instructional cueing for stating sternal precautions.                 Following commands: Intact        Cueing   Cueing Techniques: Verbal cues  Exercises         General Comments son present, noted SOB requiring seated rest break    Pertinent Vitals/ Pain       Pain Assessment Pain Assessment: Faces Faces Pain Scale: Hurts little more Pain Location: chest Pain Descriptors / Indicators: Grimacing, Guarding, Discomfort, Moaning Pain Intervention(s): Limited activity within patient's tolerance         Frequency  Min 2X/week        Progress Toward Goals  OT Goals(current goals can now be found in the care plan section)  Progress towards OT goals: Progressing toward goals  Acute Rehab OT Goals OT Goal Formulation: With patient Time For Goal Achievement: 01/13/24 Potential to Achieve Goals: Good  Plan         AM-PAC OT "6 Clicks" Daily Activity     Outcome Measure   Help from another person eating meals?: None Help from another person taking care of personal grooming?: A Little Help from another person toileting, which includes using toliet, bedpan, or urinal?: A Little Help from another person bathing (including washing, rinsing, drying)?: A Little Help from another person to put on and taking off regular upper body clothing?: A Little Help from another person to put on and taking off regular lower body clothing?: A Lot 6 Click Score: 18    End of Session Equipment Utilized During Treatment: Rolling walker (2 wheels)  OT Visit Diagnosis: Muscle weakness (generalized) (M62.81);Pain;Dizziness and giddiness (R42);Unsteadiness on feet (R26.81)   Activity Tolerance Patient limited by fatigue   Patient Left  in bed;with call bell/phone within reach;with family/visitor present   Nurse Communication          Time: 1610-9604 OT Time Calculation (min): 40 min  Charges: OT General Charges $OT Visit: 1 Visit OT Treatments $Self Care/Home Management : 38-52 mins  Ardena Becker, OTR/L Acute Rehabilitation Services  Office [phone] 01/02/2024

## 2024-01-02 NOTE — Progress Notes (Addendum)
 [address]            [place] [address]          [phone]   6 Days Post-Op Procedure(s) (LRB): CORONARY ARTERY BYPASS GRAFTING (CABG) TIMES THREE USING LEFT INTERNAL MAMMARY ARTERY AND ENDOSCOPICALLY HARVESTED RIGHT GREATER SAPHENOUS VEIN (N/A) ECHOCARDIOGRAM, TRANSESOPHAGEAL (N/A)  Total Length of Stay:  LOS: 6 days   Subjective: Awake and alert.  Walked in the hall yesterday.  No new concerns.  On room air.    Objective: Vital signs in last 24 hours: Temp:  [98 F (36.7 C)-98.9 F (37.2 C)] 98.9 F (37.2 C) (06/09 0314) Pulse Rate:  [75-87] 87 (06/09 0314) Cardiac Rhythm: Normal sinus rhythm (06/08 1900) Resp:  [16-24] 20 (06/09 0314) BP: (128-167)/(59-75) 151/65 (06/09 0314) SpO2:  [92 %-97 %] 92 % (06/09 0314) Weight:  [84.1 kg] 84.1 kg (06/09 0314)  Filed Weights   12/30/23 2358 01/01/24 0500 01/02/24 0314  Weight: 89.2 kg 83.4 kg 84.1 kg    Weight change: 0.7 kg     Intake/Output from previous day: 06/08 0701 - 06/09 0700 In: 240 [P.O.:240] Out: 1375 [Urine:1375]  Intake/Output this shift: No intake/output data recorded.  Current Meds: Scheduled Meds:  aspirin  EC  325 mg Oral Daily   Or   aspirin   324 mg Per Tube Daily   atorvastatin   80 mg Oral Daily   bisacodyl   10 mg Oral Daily   Or   bisacodyl   10 mg Rectal Daily   Chlorhexidine  Gluconate Cloth  6 each Topical Daily   docusate sodium   200 mg Oral Daily   enoxaparin  (LOVENOX ) injection  40 mg Subcutaneous Q24H   feeding supplement  237 mL Oral BID BM   fenofibrate   160 mg Oral Daily   furosemide  40 mg Oral BID   gabapentin   200 mg Oral TID   insulin  aspart  0-15 Units Subcutaneous TID WC   insulin  aspart  0-5 Units Subcutaneous QHS   insulin  glargine-yfgn  18 Units Subcutaneous BID   lidocaine   1 patch Transdermal Q24H   meclizine   25 mg Oral TID   metoprolol  tartrate  12.5 mg Oral BID   pantoprazole   40 mg Oral Daily   potassium chloride   20  mEq Oral BID   tamsulosin   0.4 mg Oral Daily   Continuous Infusions:  albumin  human Stopped (12/28/23 0018)   PRN Meds:.albumin  human, dextrose , metoprolol  tartrate, morphine  injection, ondansetron  (ZOFRAN ) IV, mouth rinse, oxyCODONE , traMADol   General appearance: alert, cooperative, and no distress Neurologic: intact Heart: RRR, NSR on monitor with a brief episode of SVT yesterday afternoon.  Lungs: breath sounds clear, shallow. CXR showing left basilar ATX.  Abdomen: soft, no tenderness Extremities: all well perfused, mild LE edema. The RLE incisions have some mild erythema that appears to be clearing but are dry. Wound: the sternotomy incision is clean and dry.  Lab Results: CBC: Recent Labs    12/31/23 0305 01/02/24 0359  WBC 4.6 4.9  HGB 7.8* 8.4*  HCT 22.7* 25.1*  PLT 148* 206   BMET:  Recent Labs    01/01/24 0859 01/02/24 0359  NA 140 138  K 4.3 4.4  CL 106 104  CO2 24 24  GLUCOSE 281* 381*  BUN 28* 23  CREATININE 1.14 1.05  CALCIUM  8.3* 8.4*    CMET: Lab Results  Component Value Date  WBC 4.9 01/02/2024   HGB 8.4 (L) 01/02/2024   HCT 25.1 (L) 01/02/2024   PLT 206 01/02/2024   GLUCOSE 381 (H) 01/02/2024   CHOL 277 (H) 12/26/2023   TRIG 403 (H) 12/26/2023   HDL 37 (L) 12/26/2023   LDLDIRECT 186 (H) 12/26/2023   LDLCALC UNABLE TO CALCULATE IF TRIGLYCERIDE OVER 400 mg/dL 28/41/3244   ALT 18 07/28/7251   AST 17 10/22/2021   NA 138 01/02/2024   K 4.4 01/02/2024   CL 104 01/02/2024   CREATININE 1.05 01/02/2024   BUN 23 01/02/2024   CO2 24 01/02/2024   TSH 2.01 11/16/2021   PSA 11.88 (H) 07/23/2019   INR 1.4 (H) 12/27/2023   HGBA1C 13.3 (H) 12/25/2023   MICROALBUR 40.5 (H) 12/05/2019      PT/INR:  No results for input(s): "LABPROT", "INR" in the last 72 hours.  Radiology: No results found.   Assessment/Plan: S/P Procedure(s) (LRB): CORONARY ARTERY BYPASS GRAFTING (CABG) TIMES THREE USING LEFT INTERNAL MAMMARY ARTERY AND ENDOSCOPICALLY  HARVESTED RIGHT GREATER SAPHENOUS VEIN (N/A) ECHOCARDIOGRAM, TRANSESOPHAGEAL (N/A)  -POD6 CABG x 3 after presenting with angina and new LBBB. H/O NSTEMI and PCI in 2023. Has preserved biventricular function. Stable VS and cardiac rhythm. Continue ASA, statin, BB.   -GI: Tolerating cardiac diet with no trouble.  Abdomen benign, having bowel movements.  -ENDO: type 2 DM with poor control PTA.  HgbA1C 13.  Hyperglycemic after diet advanced.   Semglee  increased to 18 units subcu twice daily 6/8.  Glucose 250's-300+ past 24 hours. Will increase the Semglee  and add meal time Novalog. Continue to monitor and provide SSI.   -RENAL: small bump in Creat 1.38 early postop but appears to be returning to baseline. Wt still about 4kg above pre-op, needs more diuresis.  Monitoring.   -PULM: good oxygenation on RA. Continue to work on [employer].  Chest x-ray today showing left basilar ATX.  Encouraging pulm hygiene, ambulation.   -Neuro: intact grossly.  His h/o peripheral neuropathy that has slowed progress with mobility but he is improving.   Continue PT and OT  -DVT PPX- ambulate, daily enoxaparin  SQ.   -Disposition: Diurese, continue working on [employer].  Plan eventual discharge home with home health PT and OT 1-2 days.  Myron G. Roddenberry, PA-C 01/02/2024 7:34 AM  Agree Awaiting BM Dispo planning  Hilarie Lovely

## 2024-01-02 NOTE — Progress Notes (Signed)
 Physical Therapy Treatment Patient Details Name: Scott Donaldson MRN: [id number] DOB: 1951-06-19 Today's Date: 01/02/2024   History of Present Illness 73 y.o. male presents to [facility] 12/25/23 with chest pain and admitted for unstable angina. 6/2 L heart cath. 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 Extubated 6/4.  PMHx: CAD (NSTEMI in 2023), PCI to Lcx), HTN, T2DM, HLD, vertigo.    PT Comments  Pt progressing slowly towards all goals. Pt continues with decreased activity tolerance, chest pain with mobility, requires assist for ADLs, verbal cues to adhere to sternal precautions, and use of rollator for safe ambulation. Recommend rollator as patient requires freq rest breaks during ambulation and it would aide with energy conservation and provide a safe place to sit to minimize fall risk. Acute PT to cont to follow.   If plan is discharge home, recommend the following: Assist for transportation;Help with stairs or ramp for entrance;Assistance with cooking/housework;A little help with walking and/or transfers;A little help with bathing/dressing/bathroom   Can travel by private vehicle        Equipment Recommendations  Rolling walker (2 wheels)    Recommendations for Other Services       Precautions / Restrictions Precautions Precautions: Sternal;Fall Precaution Booklet Issued: Yes (comment) Recall of Precautions/Restrictions: Impaired Precaution/Restrictions Comments: able to state precautions; cues to adhere Restrictions Weight Bearing Restrictions Per Provider Order: No Other Position/Activity Restrictions: sternal     Mobility  Bed Mobility Overal bed mobility: Needs Assistance Bed Mobility: Rolling, Sidelying to Sit Rolling: Min assist Sidelying to sit: HOB elevated, Min assist       General bed mobility comments: HOB elevated, reports he is going to sleep in a recliner. minA for trunk elevation, hugged heart pillow    Transfers Overall transfer level: Needs assistance Equipment used: Rolling walker  (2 wheels) Transfers: Sit to/from Stand Sit to Stand: Min assist           General transfer comment: minA to power up, verbal cues to keep hands on knees and not to push up    Ambulation/Gait Ambulation/Gait assistance: Contact guard assist Gait Distance (Feet): 150 Feet Assistive device: Rolling walker (2 wheels) Gait Pattern/deviations: Step-through pattern, Decreased stride length, Trunk flexed Gait velocity: dec Gait velocity interpretation: 1.31 - 2.62 ft/sec, indicative of limited community ambulator   General Gait Details: slow, cautious gait with cues for proximity to RW and for turning inside RW   Stairs Stairs: Yes Stairs assistance: Min assist Stair Management: Step to pattern, One rail Right, Sideways Number of Stairs: 4 General stair comments: pt with labored effort, educated on sideways technique with both hands on R handrail and leading up with L LE   Wheelchair Mobility     Tilt Bed    Modified Rankin (Stroke Patients Only)       Balance Overall balance assessment: Needs assistance, Mild deficits observed, not formally tested Sitting-balance support: No upper extremity supported, Feet supported Sitting balance-Leahy Scale: Fair Sitting balance - Comments: Able to sit for ~3 minutes before needing to return to supine for symptoms of dizziness/nausea   Standing balance support: Bilateral upper extremity supported, Reliant on assistive device for balance Standing balance-Leahy Scale: Poor Standing balance comment: reliant on bilat UEs                            Communication Communication Communication: No apparent difficulties  Cognition Arousal: Alert Behavior During Therapy: WFL for tasks assessed/performed   PT - Cognitive impairments: No apparent impairments  Following commands: Intact      Cueing Cueing Techniques: Verbal cues  Exercises      General Comments General comments (skin  integrity, edema, etc.): son present, noted SOB requiring seated rest break      Pertinent Vitals/Pain Pain Assessment Pain Assessment: Faces Faces Pain Scale: Hurts little more Pain Location: chest Pain Descriptors / Indicators: Grimacing, Guarding, Discomfort, Moaning    Home Living                          Prior Function            PT Goals (current goals can now be found in the care plan section) Acute Rehab PT Goals Patient Stated Goal: to be able to walk PT Goal Formulation: With patient/family Time For Goal Achievement: 01/13/24 Potential to Achieve Goals: Good Progress towards PT goals: Progressing toward goals    Frequency    Min 2X/week      PT Plan      Co-evaluation              AM-PAC PT "6 Clicks" Mobility   Outcome Measure  Help needed turning from your back to your side while in a flat bed without using bedrails?: A Little Help needed moving from lying on your back to sitting on the side of a flat bed without using bedrails?: A Little Help needed moving to and from a bed to a chair (including a wheelchair)?: A Little Help needed standing up from a chair using your arms (e.g., wheelchair or bedside chair)?: A Little Help needed to walk in hospital room?: A Little Help needed climbing 3-5 steps with a railing? : A Little 6 Click Score: 18    End of Session Equipment Utilized During Treatment: Gait belt Activity Tolerance: Patient tolerated treatment well Patient left: with call bell/phone within reach;with family/visitor present;in bed Nurse Communication: Mobility status PT Visit Diagnosis: Unsteadiness on feet (R26.81);Other abnormalities of gait and mobility (R26.89)     Time: 8119-1478 PT Time Calculation (min) (ACUTE ONLY): 24 min  Charges:    $Gait Training: 23-37 mins PT General Charges $$ ACUTE PT VISIT: 1 Visit                     Renaee Caro, PT, DPT Acute Rehabilitation Services Secure chat  preferred Office #: [phone]    Jenna Moan 01/02/2024, 2:38 PM

## 2024-01-03 ENCOUNTER — Other Ambulatory Visit (HOSPITAL_COMMUNITY): Payer: Self-pay

## 2024-01-03 DIAGNOSIS — I200 Unstable angina: Secondary | ICD-10-CM | POA: Diagnosis not present

## 2024-01-03 LAB — GLUCOSE, CAPILLARY
Glucose-Capillary: 119 mg/dL — ABNORMAL HIGH (ref 70–99)
Glucose-Capillary: 216 mg/dL — ABNORMAL HIGH (ref 70–99)

## 2024-01-03 MED ORDER — LANTUS SOLOSTAR 100 UNIT/ML ~~LOC~~ SOPN
30.0000 [IU] | PEN_INJECTOR | Freq: Every day | SUBCUTANEOUS | 1 refills | Status: AC
  Filled 2024-01-03: qty 9, 30d supply, fill #0
  Filled 2024-02-10: qty 9, 30d supply, fill #1
  Filled 2024-03-22: qty 9, 30d supply, fill #2

## 2024-01-03 MED ORDER — POTASSIUM CHLORIDE CRYS ER 20 MEQ PO TBCR
20.0000 meq | EXTENDED_RELEASE_TABLET | Freq: Every day | ORAL | 0 refills | Status: DC
  Filled 2024-01-03: qty 7, 7d supply, fill #0

## 2024-01-03 MED ORDER — LYUMJEV KWIKPEN 100 UNIT/ML ~~LOC~~ SOPN
8.0000 [IU] | PEN_INJECTOR | Freq: Three times a day (TID) | SUBCUTANEOUS | 1 refills | Status: AC
  Filled 2024-01-03: qty 6, 17d supply, fill #0
  Filled 2024-02-10: qty 6, 17d supply, fill #1

## 2024-01-03 MED ORDER — INSULIN PEN NEEDLE 32G X 4 MM MISC
0 refills | Status: DC
  Filled 2024-01-03: qty 100, 30d supply, fill #0

## 2024-01-03 MED ORDER — FUROSEMIDE 40 MG PO TABS
40.0000 mg | ORAL_TABLET | Freq: Every day | ORAL | 0 refills | Status: AC
  Filled 2024-01-03: qty 7, 7d supply, fill #0

## 2024-01-03 MED ORDER — METOPROLOL TARTRATE 25 MG PO TABS
25.0000 mg | ORAL_TABLET | Freq: Two times a day (BID) | ORAL | 2 refills | Status: DC
  Filled 2024-01-03: qty 60, 30d supply, fill #0

## 2024-01-03 MED ORDER — ASPIRIN 325 MG PO TBEC
325.0000 mg | DELAYED_RELEASE_TABLET | Freq: Every day | ORAL | 3 refills | Status: AC
Start: 2024-01-03 — End: ?
  Filled 2024-01-03: qty 100, 100d supply, fill #0

## 2024-01-03 MED ORDER — METOPROLOL TARTRATE 25 MG PO TABS
25.0000 mg | ORAL_TABLET | Freq: Two times a day (BID) | ORAL | Status: DC
  Administered 2024-01-03: 25 mg via ORAL
  Filled 2024-01-03: qty 1

## 2024-01-03 MED ORDER — FENOFIBRATE 160 MG PO TABS
160.0000 mg | ORAL_TABLET | Freq: Every day | ORAL | 5 refills | Status: AC
Start: 2024-01-03 — End: ?
  Filled 2024-01-03: qty 30, 30d supply, fill #0
  Filled 2024-02-10: qty 30, 30d supply, fill #1
  Filled 2024-03-22: qty 30, 30d supply, fill #2
  Filled 2024-04-20: qty 30, 30d supply, fill #0
  Filled 2024-05-23: qty 30, 30d supply, fill #1

## 2024-01-03 MED ORDER — TRAMADOL HCL 50 MG PO TABS
50.0000 mg | ORAL_TABLET | ORAL | 0 refills | Status: AC | PRN
  Filled 2024-01-03: qty 28, 5d supply, fill #0

## 2024-01-03 NOTE — Progress Notes (Signed)
 CARDIAC REHAB PHASE I   [facility] education completed. Referral for CRP2 sent to [facility]. Plan for discharge home today.   Ronny Colas, RN BSN 01/03/2024 9:17 AM

## 2024-01-03 NOTE — TOC Transition Note (Signed)
 Transition of Care (TOC) - Discharge Note Sherin Dingwall RN, BSN Transitions of Care Unit 4E- RN Case Manager See Treatment Team for direct phone #   Patient Details  Name: Scott Donaldson MRN: [id number] Date of Birth: Jun 10, 1951  Transition of Care [facility]) CM/SW Contact:  Rox Cope, RN Phone Number: 01/03/2024, 11:56 AM   Clinical Narrative:    Pt stable for transition home today, Orders placed for [facility] and DME needs.   CM to bedside to speak with pt, family also present. Discussed HH needs- list provided for choice- pt has TCTS office referral to [facility] for [facility]- pt voiced he is go with using [facility] for services and voiced no preference for DME provider. Explained insurance does not cover shower chair- family will follow up to purchase out of pocket   Address, phone # confirmed,  Pt has PCP f/u scheduled for today w/ [facility]- call made to practice and rescheduled appointment for this Thurs 6/12- for hospital f/u and primary care needs. Pt voiced he does not have any insulin  at home and has difficulty affording lantus . Will ask discharging provider to send scripts for insulin  to [facility] pharmacy for insulin  and pt will plan to f/u with new PCP for further insulin  needs.  Pt voiced he has other meds at home to last until he goes to PCP on Thur.   Call made to [facility] for DME- rollator need- DME to be delivered to room prior to discharge.   Call made to [facility] liaison- referral confirmed for HHPT/OT.   Family at bedside and will transport home once DME delivered and meds ready at [facility] pharmacy.    Final next level of care: Home w Home Health Services Barriers to Discharge: Barriers Resolved   Patient Goals and CMS Choice Patient states their goals for this hospitalization and ongoing recovery are:: plan to return home with family support CMS Medicare.gov Compare Post Acute Care list provided to:: Patient Choice offered to / list presented to : Patient       Discharge Placement                 Home w/ [facility]      Discharge Plan and Services Additional resources added to the After Visit Summary for   In-house Referral: NA Discharge Planning Services: CM Consult, Follow-up appt scheduled, Medication Assistance Post Acute Care Choice: Home Health, Durable Medical Equipment          DME Arranged: Walker rolling with seat DME Agency: [facility] Date DME Agency Contacted: 01/03/24 Time DME Agency Contacted: [phone] Representative spoke with at DME Agency: Zack HH Arranged: PT, OT HH Agency: [facility] Date HH Agency Contacted: 01/03/24 Time HH Agency Contacted: [phone] Representative spoke with at [facility] Agency: Glenora Laos  Social Drivers of Health (SDOH) Interventions SDOH Screenings   Food Insecurity: Food Insecurity Present (12/25/2023)  Housing: Low Risk  (12/25/2023)  Transportation Needs: No Transportation Needs (12/25/2023)  Utilities: Not At Risk (12/25/2023)  Alcohol Screen: Low Risk  (11/13/2021)  Depression (PHQ2-9): Low Risk  (01/06/2022)  Financial Resource Strain: Low Risk  (11/13/2021)  Physical Activity: Insufficiently Active (11/13/2021)  Social Connections: Moderately Isolated (12/25/2023)  Stress: No Stress Concern Present (11/13/2021)  Tobacco Use: Medium Risk (12/27/2023)     Readmission Risk Interventions    01/03/2024   11:56 AM  Readmission Risk Prevention Plan  Transportation Screening Complete  PCP or Specialist Appt within 3-5 Days Complete  HRI or Home Care Consult Complete  Social  Work Consult for Recovery Care Planning/Counseling Complete  Palliative Care Screening Not Applicable  Medication Review Oceanographer) Complete

## 2024-01-03 NOTE — Progress Notes (Signed)
 [address]            [place] [address]          [phone]   7 Days Post-Op Procedure(s) (LRB): CORONARY ARTERY BYPASS GRAFTING (CABG) TIMES THREE USING LEFT INTERNAL MAMMARY ARTERY AND ENDOSCOPICALLY HARVESTED RIGHT GREATER SAPHENOUS VEIN (N/A) ECHOCARDIOGRAM, TRANSESOPHAGEAL (N/A)  Total Length of Stay:  LOS: 7 days   Subjective: Awake and alert. Sitting up eating breakfast.  Walked in the hall and in the stairwell yesterday.  No new concerns. Eager to return home.  On room air.  Objective: Vital signs in last 24 hours: Temp:  [98.7 F (37.1 C)-99.2 F (37.3 C)] 99.2 F (37.3 C) (06/10 0430) Pulse Rate:  [73-85] 82 (06/10 0430) Cardiac Rhythm: Normal sinus rhythm;Bundle branch block (06/09 1904) Resp:  [17-22] 17 (06/10 0430) BP: (118-147)/(52-81) 134/62 (06/10 0430) SpO2:  [89 %-95 %] 93 % (06/10 0430) Weight:  [82.5 kg] 82.5 kg (06/10 0430)  Filed Weights   01/01/24 0500 01/02/24 0314 01/03/24 0430  Weight: 83.4 kg 84.1 kg 82.5 kg    Weight change: -1.6 kg     Intake/Output from previous day: 06/09 0701 - 06/10 0700 In: 480 [P.O.:480] Out: 1000 [Urine:1000]  Intake/Output this shift: No intake/output data recorded.  Current Meds: Scheduled Meds:  aspirin  EC  325 mg Oral Daily   Or   aspirin   324 mg Per Tube Daily   atorvastatin   80 mg Oral Daily   bisacodyl   10 mg Oral Daily   Or   bisacodyl   10 mg Rectal Daily   Chlorhexidine  Gluconate Cloth  6 each Topical Daily   docusate sodium   200 mg Oral Daily   enoxaparin  (LOVENOX ) injection  40 mg Subcutaneous Q24H   feeding supplement  237 mL Oral BID BM   fenofibrate   160 mg Oral Daily   furosemide  40 mg Oral BID   gabapentin   200 mg Oral TID   insulin  aspart  0-15 Units Subcutaneous TID WC   insulin  aspart  0-5 Units Subcutaneous QHS   insulin  aspart  5 Units Subcutaneous TID WC   insulin  glargine-yfgn  18 Units Subcutaneous BID   lidocaine   1 patch  Transdermal Q24H   meclizine   25 mg Oral TID   metoprolol  tartrate  25 mg Oral BID   pantoprazole   40 mg Oral Daily   potassium chloride   20 mEq Oral BID   tamsulosin   0.4 mg Oral Daily   Continuous Infusions:  albumin  human Stopped (12/28/23 0018)   PRN Meds:.albumin  human, dextrose , metoprolol  tartrate, morphine  injection, ondansetron  (ZOFRAN ) IV, mouth rinse, oxyCODONE , traMADol   General appearance: alert, cooperative, and no distress Neurologic: intact Heart: RRR, NSR on monitor with a occasional SVT.  Lungs: breath sounds clear, shallow. CXR showing left basilar ATX.  Abdomen: soft, no tenderness Extremities: all well perfused, mild LE edema. The RLE incisions have some mild erythema that appears to be clearing but are dry. Wound: the sternotomy incision is clean and dry.  Lab Results: CBC: Recent Labs    01/02/24 0359  WBC 4.9  HGB 8.4*  HCT 25.1*  PLT 206   BMET:  Recent Labs    01/01/24 0859 01/02/24 0359  NA 140 138  K 4.3 4.4  CL 106 104  CO2 24 24  GLUCOSE 281* 381*  BUN 28* 23  CREATININE 1.14 1.05  CALCIUM  8.3*  8.4*    CMET: Lab Results  Component Value Date   WBC 4.9 01/02/2024   HGB 8.4 (L) 01/02/2024   HCT 25.1 (L) 01/02/2024   PLT 206 01/02/2024   GLUCOSE 381 (H) 01/02/2024   CHOL 277 (H) 12/26/2023   TRIG 403 (H) 12/26/2023   HDL 37 (L) 12/26/2023   LDLDIRECT 186 (H) 12/26/2023   LDLCALC UNABLE TO CALCULATE IF TRIGLYCERIDE OVER 400 mg/dL 16/04/9603   ALT 18 54/03/8118   AST 17 10/22/2021   NA 138 01/02/2024   K 4.4 01/02/2024   CL 104 01/02/2024   CREATININE 1.05 01/02/2024   BUN 23 01/02/2024   CO2 24 01/02/2024   TSH 2.01 11/16/2021   PSA 11.88 (H) 07/23/2019   INR 1.4 (H) 12/27/2023   HGBA1C 13.3 (H) 12/25/2023   MICROALBUR 40.5 (H) 12/05/2019      PT/INR:  No results for input(s): "LABPROT", "INR" in the last 72 hours.  Radiology: No results found.   Assessment/Plan: S/P Procedure(s) (LRB): CORONARY ARTERY BYPASS  GRAFTING (CABG) TIMES THREE USING LEFT INTERNAL MAMMARY ARTERY AND ENDOSCOPICALLY HARVESTED RIGHT GREATER SAPHENOUS VEIN (N/A) ECHOCARDIOGRAM, TRANSESOPHAGEAL (N/A)  -POD7 CABG x 3 after presenting with angina and new LBBB. H/O NSTEMI and PCI in 2023. Has preserved biventricular function. Stable VS, occasional SVT.  Continue ASA, statin,  increase BB for SVT since BP improved.   -GI: Tolerating cardiac diet with no trouble.  Abdomen benign, having bowel movements.  -ENDO: type 2 DM with poor control PTA.  HgbA1C 13.  Better control with addition of meal coverage yesterday. To resume his usual regimen at discharge with metformin , HS Lantus , Lispro AC.  -RENAL: small bump in Creat 1.38 early postop with return to baseline. Wt still about 4kg above pre-op, will continue Lasix at discharge.  Monitoring.   -PULM: good oxygenation on RA. Continue to work on [employer].  Chest x-ray 6/9 showed left basilar ATX.  Encouraging pulm hygiene, ambulation.   -Neuro: intact grossly.  His h/o peripheral neuropathy that has slowed progress with mobility but he is improving.   Arranged home  PT and OT  -DVT PPX- ambulate, on daily enoxaparin  SQ.   -Disposition: Diurese, continue working on [employer].  Plan discharge home today with home health PT and OT.  Bradee Common G. Quientin Jent, PA-C 01/03/2024 7:44 AM

## 2024-01-03 NOTE — Progress Notes (Signed)
 Physical Therapy Treatment Patient Details Name: Scott Donaldson MRN: [id number] DOB: [birth year] Today's Date: 01/03/2024   History of Present Illness 73 y.o. male presents to [facility] 12/25/23 with chest pain and admitted for unstable angina. 6/2 L heart cath. 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 Extubated 6/4.  PMHx: CAD (NSTEMI in 2023), PCI to Lcx), HTN, T2DM, HLD, vertigo.    PT Comments  Pt resting in bed on arrival and agreeable to session with continued progress towards acute goals. Pt continues to require cues for adherence to all precautions during mobility and continues to be limited in safe mobility by decreased activity tolerance, impaired balance/postural reactions and general weakness. Pt demonstrating bed mobility, transfers and gait with RW support with grossly CGA for safety with cues hand placement throughout. Pt able to ascend/descend stairs with good recall for safety and sequencing. Pt was educated on continued walker use to maximize functional independence, safety, and decrease risk for falls, as well as appropriate activity progression and IS use with pt verbalizing understanding. Pt continues to benefit from skilled PT services to progress toward functional mobility goals.     If plan is discharge home, recommend the following: Assist for transportation;Help with stairs or ramp for entrance;Assistance with cooking/housework;A little help with walking and/or transfers;A little help with bathing/dressing/bathroom   Can travel by private Psychologist, clinical (4 wheels)    Recommendations for Other Services       Precautions / Restrictions Precautions Precautions: Sternal;Fall Precaution Booklet Issued: Yes (comment) Recall of Precautions/Restrictions: Impaired Precaution/Restrictions Comments: min instructional cueing for asherence during mobility Restrictions Weight Bearing Restrictions Per Provider Order: No Other Position/Activity Restrictions: sternal      Mobility  Bed Mobility Overal bed mobility: Needs Assistance Bed Mobility: Rolling, Sidelying to Sit, Sit to Sidelying Rolling: Supervision Sidelying to sit: Min assist     Sit to sidelying: Supervision General bed mobility comments: Min instructional cueing for sequencing rolling and sidelying to sit adhering to sternal precautions, light assist to come up to sitting from flat bed    Transfers Overall transfer level: Needs assistance Equipment used: Rolling walker (2 wheels) Transfers: Sit to/from Stand, Bed to chair/wheelchair/BSC Sit to Stand: Contact guard assist           General transfer comment: Min assist for initial stand from bed and then min guard for repetition.    Ambulation/Gait Ambulation/Gait assistance: Contact guard assist Gait Distance (Feet): 120 Feet (x2 with seated rest) Assistive device: Rolling walker (2 wheels) Gait Pattern/deviations: Step-through pattern, Decreased stride length, Trunk flexed Gait velocity: dec     General Gait Details: slow, cautious gait, mild LOB with pt able to self correct   Stairs   Stairs assistance: Min assist Stair Management: Step to pattern, One rail Right, Sideways Number of Stairs: 6 General stair comments: good recall for sideways technique and sequencing   Wheelchair Mobility     Tilt Bed    Modified Rankin (Stroke Patients Only)       Balance Overall balance assessment: Needs assistance, Mild deficits observed, not formally tested Sitting-balance support: No upper extremity supported, Feet supported Sitting balance-Leahy Scale: Fair     Standing balance support: Bilateral upper extremity supported, Reliant on assistive device for balance Standing balance-Leahy Scale: Poor Standing balance comment: reliant on bilat UEs                            Communication Communication  Communication: No apparent difficulties  Cognition Arousal: Alert Behavior During Therapy: WFL for tasks  assessed/performed   PT - Cognitive impairments: No apparent impairments                         Following commands: Intact      Cueing Cueing Techniques: Verbal cues  Exercises      General Comments General comments (skin integrity, edema, etc.): son and daughter present, noted SOB requiring seated rest break      Pertinent Vitals/Pain Pain Assessment Pain Assessment: Faces Faces Pain Scale: Hurts little more Pain Location: chest at incision with coughing Pain Descriptors / Indicators: Grimacing, Guarding, Discomfort, Moaning Pain Intervention(s): Monitored during session, Limited activity within patient's tolerance    Home Living                          Prior Function            PT Goals (current goals can now be found in the care plan section) Acute Rehab PT Goals Patient Stated Goal: to go home PT Goal Formulation: With patient/family Time For Goal Achievement: 01/13/24 Progress towards PT goals: Progressing toward goals    Frequency    Min 2X/week      PT Plan      Co-evaluation              AM-PAC PT "6 Clicks" Mobility   Outcome Measure  Help needed turning from your back to your side while in a flat bed without using bedrails?: A Little Help needed moving from lying on your back to sitting on the side of a flat bed without using bedrails?: A Little Help needed moving to and from a bed to a chair (including a wheelchair)?: A Little Help needed standing up from a chair using your arms (e.g., wheelchair or bedside chair)?: A Little Help needed to walk in hospital room?: A Little Help needed climbing 3-5 steps with a railing? : A Little 6 Click Score: 18    End of Session Equipment Utilized During Treatment: Gait belt Activity Tolerance: Patient tolerated treatment well Patient left: with call bell/phone within reach;with family/visitor present;in bed Nurse Communication: Mobility status PT Visit Diagnosis: Unsteadiness  on feet (R26.81);Other abnormalities of gait and mobility (R26.89)     Time: 2025-4270 PT Time Calculation (min) (ACUTE ONLY): 23 min  Charges:    $Gait Training: 23-37 mins PT General Charges $$ ACUTE PT VISIT: 1 Visit                     Scott Donaldson R. PTA Acute Rehabilitation Services Office: [phone]   Scott Donaldson 01/03/2024, 9:37 AM

## 2024-01-03 NOTE — Progress Notes (Addendum)
  Progress Note  Patient Name: Scott Donaldson Date of Encounter: 01/03/2024 [facility] Cardiologist: Janelle Mediate, MD   Interval Summary    Feeling well this morning, getting up to walk with PT  Vital Signs Vitals:   01/02/24 2050 01/02/24 2341 01/03/24 0430 01/03/24 0832  BP: (!) 118/52 127/64 134/62 126/65  Pulse: 80 73 82 88  Resp: 19 18 17 20   Temp: 98.9 F (37.2 C) 99 F (37.2 C) 99.2 F (37.3 C) 98.7 F (37.1 C)  TempSrc: Oral Oral Oral Oral  SpO2: 91% 94% 93% 92%  Weight:   82.5 kg   Height:        Intake/Output Summary (Last 24 hours) at 01/03/2024 1610 Last data filed at 01/03/2024 0700 Gross per 24 hour  Intake 240 ml  Output 850 ml  Net -610 ml      01/03/2024    4:30 AM 01/02/2024    3:14 AM 01/01/2024    5:00 AM  Last 3 Weights  Weight (lbs) 181 lb 14.1 oz 185 lb 6.5 oz 183 lb 13.8 oz  Weight (kg) 82.5 kg 84.1 kg 83.4 kg      Telemetry/ECG  Sinus Rhythm, sinus tachycardia at times - Personally Reviewed  Physical Exam   GEN: No acute distress.   Neck: No JVD Cardiac: RRR, no murmurs, rubs, or gallops.  Respiratory: Clear to auscultation bilaterally. GI: Soft, nontender, non-distended  MS: No edema  Assessment & Plan   73 y.o. male with a hx of CAD status post DES to LCx, hypertension, hyperlipidemia, BPH status post TURP, diabetes    Unstable angina CAD status post CABG x3  -- Postop day 7, progressing well with plans for discharge later today.  Continue aspirin  325 mg daily, atorvastatin  80 mg daily, metoprolol  25 mg twice daily -- Echo with LVEF of 55 to 60%, grade 1 diastolic dysfunction, normal RV function, no significant valvular disease.  Hypertension -- Well-controlled on metoprolol  25 mg twice daily  Hyperlipidemia -- On atorvastatin  80 mg daily  DM -- Hgb A1c 13.3 -- will be resumed on his home insulin  regimen -- will need to consider addition of SLGT2 outpatient once A1c closer to 10 -- have asked CM to see  patient prior to DC today  Will arrange outpatient follow up  For questions or updates, please contact [facility] Please consult [email or web address] for contact info under       Signed, Johnie Nailer, NP    ATTENDING ATTESTATION:  After conducting a review of all available clinical information with the care team, interviewing the patient, and performing a physical exam, I agree with the findings and plan described in this note.   GEN: No acute distress.   HEENT:  MMM, no JVD, no scleral icterus Cardiac: RRR, no murmurs, rubs, or gallops. Well healing incision Respiratory: Clear to auscultation bilaterally. GI: Soft, nontender, non-distended  MS: No edema; No deformity. Neuro:  Nonfocal  Vasc:  +2 radial pulses  Patient doing well after CABG.  Continue ASA 325 (reduce to 81 later), atorvastatin  80, and consider SGLT2i as outpatient.  Planned d/c today.    Alyssa Backbone, MD Pager [phone]

## 2024-01-03 NOTE — Care Management Important Message (Signed)
 Important Message  Patient Details  Name: Scott Donaldson MRN: [id number] Date of Birth: 1951-03-19   Important Message Given:  Yes - Medicare IM     Janith Melnick 01/03/2024, 11:13 AM

## 2024-01-04 ENCOUNTER — Telehealth: Payer: Self-pay | Admitting: *Deleted

## 2024-01-04 ENCOUNTER — Other Ambulatory Visit: Payer: Self-pay | Admitting: Physician Assistant

## 2024-01-04 DIAGNOSIS — I252 Old myocardial infarction: Secondary | ICD-10-CM | POA: Diagnosis not present

## 2024-01-04 DIAGNOSIS — Z955 Presence of coronary angioplasty implant and graft: Secondary | ICD-10-CM | POA: Diagnosis not present

## 2024-01-04 DIAGNOSIS — Z7984 Long term (current) use of oral hypoglycemic drugs: Secondary | ICD-10-CM | POA: Diagnosis not present

## 2024-01-04 DIAGNOSIS — M5412 Radiculopathy, cervical region: Secondary | ICD-10-CM | POA: Diagnosis not present

## 2024-01-04 DIAGNOSIS — Z7982 Long term (current) use of aspirin: Secondary | ICD-10-CM | POA: Diagnosis not present

## 2024-01-04 DIAGNOSIS — Z79891 Long term (current) use of opiate analgesic: Secondary | ICD-10-CM | POA: Diagnosis not present

## 2024-01-04 DIAGNOSIS — E538 Deficiency of other specified B group vitamins: Secondary | ICD-10-CM | POA: Diagnosis not present

## 2024-01-04 DIAGNOSIS — E7800 Pure hypercholesterolemia, unspecified: Secondary | ICD-10-CM | POA: Diagnosis not present

## 2024-01-04 DIAGNOSIS — Z87891 Personal history of nicotine dependence: Secondary | ICD-10-CM | POA: Diagnosis not present

## 2024-01-04 DIAGNOSIS — Z951 Presence of aortocoronary bypass graft: Secondary | ICD-10-CM | POA: Diagnosis not present

## 2024-01-04 DIAGNOSIS — Z794 Long term (current) use of insulin: Secondary | ICD-10-CM | POA: Diagnosis not present

## 2024-01-04 DIAGNOSIS — M10061 Idiopathic gout, right knee: Secondary | ICD-10-CM | POA: Diagnosis not present

## 2024-01-04 DIAGNOSIS — J40 Bronchitis, not specified as acute or chronic: Secondary | ICD-10-CM | POA: Diagnosis not present

## 2024-01-04 DIAGNOSIS — D649 Anemia, unspecified: Secondary | ICD-10-CM | POA: Diagnosis not present

## 2024-01-04 DIAGNOSIS — I110 Hypertensive heart disease with heart failure: Secondary | ICD-10-CM | POA: Diagnosis not present

## 2024-01-04 DIAGNOSIS — I25110 Atherosclerotic heart disease of native coronary artery with unstable angina pectoris: Secondary | ICD-10-CM | POA: Diagnosis not present

## 2024-01-04 DIAGNOSIS — E1142 Type 2 diabetes mellitus with diabetic polyneuropathy: Secondary | ICD-10-CM | POA: Diagnosis not present

## 2024-01-04 DIAGNOSIS — I5032 Chronic diastolic (congestive) heart failure: Secondary | ICD-10-CM | POA: Diagnosis not present

## 2024-01-04 DIAGNOSIS — Z48812 Encounter for surgical aftercare following surgery on the circulatory system: Secondary | ICD-10-CM | POA: Diagnosis not present

## 2024-01-04 MED ORDER — ONDANSETRON 4 MG PO TBDP: 4.0000 mg | ORAL_TABLET | Freq: Three times a day (TID) | ORAL | 0 refills | Status: AC | PRN

## 2024-01-04 NOTE — Telephone Encounter (Signed)
 Patient and daughter contacted the office with c/o nausea. Per patient, he woke up at 3 am feeling nauseous. States around 9 am he vomited. Reports eating dinner last night but has not had anything to eat this morning. Had BM's prior to discharge but has not had one since hospital discharge yesterday. Reports feeling nauseous during hospital stay. Discussed with B. Stehler, PA. Zofran  sent to patient's pharmacy. Advised patient and daughter to contact our office is patient develops fevers, chills or no longer having BM's. Daughter verbalized understanding.

## 2024-01-04 NOTE — Progress Notes (Signed)
      [address]       [place] [address]             [phone]      Patient called this AM complaining of nausea and 1 episode of vomiting this AM. States it is similar to what he was experiencing as an inpatient. He is having bowel movements. I will order Zofran  PRN. He was told to contact our office if he is no longer having bowel movements or develops fever and chills.   Randa Burton, PA-C

## 2024-01-05 ENCOUNTER — Ambulatory Visit: Payer: Self-pay | Admitting: Thoracic Surgery (Cardiothoracic Vascular Surgery)

## 2024-01-05 DIAGNOSIS — Z951 Presence of aortocoronary bypass graft: Secondary | ICD-10-CM | POA: Diagnosis not present

## 2024-01-05 DIAGNOSIS — Z794 Long term (current) use of insulin: Secondary | ICD-10-CM | POA: Diagnosis not present

## 2024-01-05 DIAGNOSIS — E1165 Type 2 diabetes mellitus with hyperglycemia: Secondary | ICD-10-CM | POA: Diagnosis not present

## 2024-01-05 DIAGNOSIS — Z09 Encounter for follow-up examination after completed treatment for conditions other than malignant neoplasm: Secondary | ICD-10-CM | POA: Diagnosis not present

## 2024-01-06 ENCOUNTER — Telehealth (HOSPITAL_COMMUNITY): Payer: Self-pay

## 2024-01-06 DIAGNOSIS — Z951 Presence of aortocoronary bypass graft: Secondary | ICD-10-CM | POA: Diagnosis not present

## 2024-01-06 DIAGNOSIS — I25110 Atherosclerotic heart disease of native coronary artery with unstable angina pectoris: Secondary | ICD-10-CM | POA: Diagnosis not present

## 2024-01-06 DIAGNOSIS — J40 Bronchitis, not specified as acute or chronic: Secondary | ICD-10-CM | POA: Diagnosis not present

## 2024-01-06 DIAGNOSIS — E538 Deficiency of other specified B group vitamins: Secondary | ICD-10-CM | POA: Diagnosis not present

## 2024-01-06 DIAGNOSIS — I110 Hypertensive heart disease with heart failure: Secondary | ICD-10-CM | POA: Diagnosis not present

## 2024-01-06 DIAGNOSIS — E7800 Pure hypercholesterolemia, unspecified: Secondary | ICD-10-CM | POA: Diagnosis not present

## 2024-01-06 DIAGNOSIS — I5032 Chronic diastolic (congestive) heart failure: Secondary | ICD-10-CM | POA: Diagnosis not present

## 2024-01-06 DIAGNOSIS — Z794 Long term (current) use of insulin: Secondary | ICD-10-CM | POA: Diagnosis not present

## 2024-01-06 DIAGNOSIS — Z7982 Long term (current) use of aspirin: Secondary | ICD-10-CM | POA: Diagnosis not present

## 2024-01-06 DIAGNOSIS — Z955 Presence of coronary angioplasty implant and graft: Secondary | ICD-10-CM | POA: Diagnosis not present

## 2024-01-06 DIAGNOSIS — Z7984 Long term (current) use of oral hypoglycemic drugs: Secondary | ICD-10-CM | POA: Diagnosis not present

## 2024-01-06 DIAGNOSIS — Z87891 Personal history of nicotine dependence: Secondary | ICD-10-CM | POA: Diagnosis not present

## 2024-01-06 DIAGNOSIS — E1142 Type 2 diabetes mellitus with diabetic polyneuropathy: Secondary | ICD-10-CM | POA: Diagnosis not present

## 2024-01-06 DIAGNOSIS — M10061 Idiopathic gout, right knee: Secondary | ICD-10-CM | POA: Diagnosis not present

## 2024-01-06 DIAGNOSIS — I252 Old myocardial infarction: Secondary | ICD-10-CM | POA: Diagnosis not present

## 2024-01-06 DIAGNOSIS — M5412 Radiculopathy, cervical region: Secondary | ICD-10-CM | POA: Diagnosis not present

## 2024-01-06 DIAGNOSIS — D649 Anemia, unspecified: Secondary | ICD-10-CM | POA: Diagnosis not present

## 2024-01-06 DIAGNOSIS — Z48812 Encounter for surgical aftercare following surgery on the circulatory system: Secondary | ICD-10-CM | POA: Diagnosis not present

## 2024-01-06 DIAGNOSIS — Z79891 Long term (current) use of opiate analgesic: Secondary | ICD-10-CM | POA: Diagnosis not present

## 2024-01-06 NOTE — Telephone Encounter (Signed)
 Patient's daughter Jaclyn left message stating patient's doctors will be putting in a referral for cardiac rehab and wanted to discuss it. Attempted to call back- no answer, left message.  We have not received the referral yet, and we will need him to complete his f/u appt with his doctor as he was just discharged from hospital.

## 2024-01-08 NOTE — Progress Notes (Deleted)
 Cardiology Office Note:    Date:  01/08/2024   ID:  Kathi Panning, DOB January 01, 1951, MRN [id number]  PCP:  Virgina Grills, FNP   [facility] Providers Cardiologist:  Janelle Mediate, MD { Click to update primary MD,subspecialty MD or APP then REFRESH:1}    Referring MD: No ref. provider found   No chief complaint on file. ***  History of Present Illness:    KYRUS HYDE is a 73 y.o. male with a hx of CAD with prior DES-LCx and now s/p CABG x 3 12/2023, hypertension, hyperlipidemia, BPH s/p TURP, DM.  He underwent CABG x 3 with LIMA-LAD, SVG-OM, SVG-PDA on 12/27/2023.  Echo with LVEF 55-60% with grade 1 DD, no significant valvular disease.   He presents for cardiology follow up.     CAD DES-LCx CABG x 3 in 12/2023 - on ASA 325 mg - continue lipitor  and BB   Hypertension - on BB   Hyperlipidemia with LDL goal < 70 12/26/2023: Cholesterol 277; HDL 37; LDL Cholesterol UNABLE TO CALCULATE IF TRIGLYCERIDE OVER 400 mg/dL; Triglycerides 403; VLDL UNABLE TO CALCULATE IF TRIGLYCERIDE OVER 400 mg/dL - continue 80 mg lipitor , fenofibrate  - referral to lipid clinic   DM with hyperglycemia A1c 13.3% Consider SGTL2i        Past Medical History:  Diagnosis Date   Abdominal pain 08/04/2016   Abnormal PSA 05/19/2015   Anemia    Arthralgia 11/12/2010   Benign prostatic hyperplasia with urinary hesitancy 06/14/2018   Cataract    Coronary artery disease    nonobstructive   Diabetes (HCC) 03/04/2007   Qualifier: Diagnosis of  By: Washington Hacker MD, Sean A    DIABETES MELLITUS, TYPE II 03/04/2007   Elevated LDL cholesterol level 06/14/2018   Foot ulcer, right (HCC) 04/15/2014   GERD (gastroesophageal reflux disease)    HOH (hard of hearing)    both ears no hearing aides   HYPERCHOLESTEROLEMIA 07/29/2008   HYPERTENSION 03/04/2007   Knee pain, left 05/16/2015   Nonintractable episodic headache 12/01/2016   migraine   SHOULDER PAIN, BILATERAL 01/12/2010   Qualifier: Diagnosis  of  By: Washington Hacker MD, Sean A    TUBULOVILLOUS ADENOMA, 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   Qualifier: Diagnosis of  By: Washington Hacker MD, Leota Randy     Past Surgical History:  Procedure Laterality Date   CARDIAC CATHETERIZATION     COLONOSCOPY  07/2017   CORONARY ARTERY BYPASS GRAFT N/A 12/27/2023   Procedure: CORONARY ARTERY BYPASS GRAFTING (CABG) TIMES THREE USING LEFT INTERNAL MAMMARY ARTERY AND ENDOSCOPICALLY HARVESTED RIGHT GREATER SAPHENOUS VEIN;  Surgeon: Hilarie Lovely, MD;  Location: [facility];  Service: Open Heart Surgery;  Laterality: N/A;   CORONARY PRESSURE/FFR WITH 3D MAPPING N/A 12/26/2023   Procedure: Coronary Pressure/FFR w/3D Mapping;  Surgeon: Kyra Phy, MD;  Location: [facility];  Service: Cardiovascular;  Laterality: N/A;   CORONARY STENT INTERVENTION N/A 10/22/2021   Procedure: CORONARY STENT INTERVENTION;  Surgeon: Kyra Phy, MD;  Location: [facility];  Service: Cardiovascular;  Laterality: N/A;   LEFT HEART CATH AND CORONARY ANGIOGRAPHY N/A 07/12/2018   Procedure: LEFT HEART CATH AND CORONARY ANGIOGRAPHY;  Surgeon: Wenona Hamilton, MD;  Location: [facility];  Service: Cardiovascular;  Laterality: N/A;   LEFT HEART CATH AND CORONARY ANGIOGRAPHY N/A 10/22/2021   Procedure: LEFT HEART CATH AND CORONARY ANGIOGRAPHY;  Surgeon: Kyra Phy, MD;  Location: [facility];  Service: Cardiovascular;  Laterality: N/A;   LEFT  HEART CATH AND CORONARY ANGIOGRAPHY N/A 12/26/2023   Procedure: LEFT HEART CATH AND CORONARY ANGIOGRAPHY;  Surgeon: Kyra Phy, MD;  Location: [facility];  Service: Cardiovascular;  Laterality: N/A;   stress cardiolite  05/30/2003   TEE WITHOUT CARDIOVERSION N/A 12/27/2023   Procedure: ECHOCARDIOGRAM, TRANSESOPHAGEAL;  Surgeon: Hilarie Lovely, MD;  Location: [facility] OR;  Service: Open Heart Surgery;  Laterality: N/A;   TRANSURETHRAL RESECTION OF PROSTATE N/A 06/04/2020   Procedure: TRANSURETHRAL RESECTION OF THE PROSTATE (TURP),  BIPOLAR;  Surgeon: Adelbert Homans, MD;  Location: [facility];  Service: Urology;  Laterality: N/A;    Current Medications: No outpatient medications have been marked as taking for the 01/10/24 encounter (Appointment) with Lamond Pilot, PA.     Allergies:   Patient has no known allergies.   Social History   Socioeconomic History   Marital status: Single    Spouse name: Not on file   Number of children: 2   Years of education: 10   Highest education level: 10th grade  Occupational History   Occupation: roofer   Occupation: Retired  Tobacco Use   Smoking status: Former    Current packs/day: 0.00    Average packs/day: 1.5 packs/day for 25.0 years (37.5 ttl pk-yrs)    Types: Cigarettes    Start date: 07/27/1963    Quit date: 07/26/1988    Years since quitting: 35.4   Smokeless tobacco: Never  Vaping Use   Vaping status: Never Used  Substance and Sexual Activity   Alcohol use: No   Drug use: No   Sexual activity: Not Currently  Other Topics Concern   Not on file  Social History Narrative   Lives with daughter in a one story home.  Has 2 children.  Semi-retired.  Works as a Designer, fashion/clothing.  Education: 10th grade.    Social Drivers of Corporate investment banker Strain: Low Risk  (11/13/2021)   Overall Financial Resource Strain (CARDIA)    Difficulty of Paying Living Expenses: Not hard at all  Food Insecurity: Low Risk  (01/05/2024)   Received from [facility]   Hunger Vital Sign    Within the past 12 months, you worried that your food would run out before you got money to buy more: Never true    Within the past 12 months, the food you bought just didn't last and you didn't have money to get more. : Never true  Recent Concern: Food Insecurity - Food Insecurity Present (12/25/2023)   Hunger Vital Sign    Worried About Running Out of Food in the Last Year: Never true    Ran Out of Food in the Last Year: Sometimes true  Transportation Needs: No  Transportation Needs (01/05/2024)   Received from [facility]    In the past 12 months, has lack of reliable transportation kept you from medical appointments, meetings, work or from getting things needed for daily living? : No  Physical Activity: Insufficiently Active (11/13/2021)   Exercise Vital Sign    Days of Exercise per Week: 2 days    Minutes of Exercise per Session: 20 min  Stress: No Stress Concern Present (11/13/2021)   [employer] of Occupational Health - Occupational Stress Questionnaire    Feeling of Stress : Not at all  Social Connections: Moderately Isolated (12/25/2023)   Social Connection and Isolation Panel    Frequency of Communication with Friends and Family: Never    Frequency of Social  Gatherings with Friends and Family: More than three times a week    Attends Religious Services: More than 4 times per year    Active Member of Clubs or Organizations: No    Attends Banker Meetings: Never    Marital Status: Widowed     Family History: The patient's ***family history includes Cancer in his father and mother; Cancer (age of onset: 1) in his brother; Diabetes in his father. There is no history of Colon cancer, Rectal cancer, or Stomach cancer.  ROS:   Please see the history of present illness.    *** All other systems reviewed and are negative.  EKGs/Labs/Other Studies Reviewed:    The following studies were reviewed today: ***      Recent Labs: 12/29/2023: Magnesium  2.5 01/02/2024: BUN 23; Creatinine, Ser 1.05; Hemoglobin 8.4; Platelets 206; Potassium 4.4; Sodium 138  Recent Lipid Panel    Component Value Date/Time   CHOL 277 (H) 12/26/2023 0446   CHOL 166 02/27/2021 0727   TRIG 403 (H) 12/26/2023 0446   HDL 37 (L) 12/26/2023 0446   HDL 33 (L) 02/27/2021 0727   CHOLHDL 7.5 12/26/2023 0446   VLDL UNABLE TO CALCULATE IF TRIGLYCERIDE OVER 400 mg/dL 16/04/9603 5409   LDLCALC UNABLE TO CALCULATE IF TRIGLYCERIDE OVER 400 mg/dL  81/19/1478 2956   LDLCALC 93 02/27/2021 0727   LDLDIRECT 186 (H) 12/26/2023 0446     Risk Assessment/Calculations:   {Does this patient have ATRIAL FIBRILLATION?:[id number]}  No BP recorded.  {Refresh Note OR Click here to enter BP  :1}***         Physical Exam:    VS:  There were no vitals taken for this visit.    Wt Readings from Last 3 Encounters:  01/03/24 181 lb 14.1 oz (82.5 kg)  09/15/23 177 lb 0.5 oz (80.3 kg)  10/08/22 177 lb (80.3 kg)     GEN: *** Well nourished, well developed in no acute distress HEENT: Normal NECK: No JVD; No carotid bruits LYMPHATICS: No lymphadenopathy CARDIAC: ***RRR, no murmurs, rubs, gallops RESPIRATORY:  Clear to auscultation without rales, wheezing or rhonchi  ABDOMEN: Soft, non-tender, non-distended MUSCULOSKELETAL:  No edema; No deformity  SKIN: Warm and dry NEUROLOGIC:  Alert and oriented x 3 PSYCHIATRIC:  Normal affect   ASSESSMENT:    No diagnosis found. PLAN:    In order of problems listed above:  ***  {The patient has an active order for outpatient cardiac rehabilitation.   Please indicate if the patient is ready to start. Do NOT delete this.  It will auto delete.  Refresh note, then sign.              Click here to document readiness and see contraindications.  :1}  Cardiac Rehabilitation Eligibility Assessment       {Are you ordering a CV Procedure (e.g. stress test, cath, DCCV, TEE, etc)?   Press F2        :[id number]}    Medication Adjustments/Labs and Tests Ordered: Current medicines are reviewed at length with the patient today.  Concerns regarding medicines are outlined above.  No orders of the defined types were placed in this encounter.  No orders of the defined types were placed in this encounter.   There are no Patient Instructions on file for this visit.   Signed, Lamond Pilot, [place]  01/08/2024 11:04 AM     [facility]

## 2024-01-09 ENCOUNTER — Telehealth (HOSPITAL_COMMUNITY): Payer: Self-pay

## 2024-01-09 DIAGNOSIS — Z955 Presence of coronary angioplasty implant and graft: Secondary | ICD-10-CM | POA: Diagnosis not present

## 2024-01-09 DIAGNOSIS — M5412 Radiculopathy, cervical region: Secondary | ICD-10-CM | POA: Diagnosis not present

## 2024-01-09 DIAGNOSIS — I110 Hypertensive heart disease with heart failure: Secondary | ICD-10-CM | POA: Diagnosis not present

## 2024-01-09 DIAGNOSIS — E1142 Type 2 diabetes mellitus with diabetic polyneuropathy: Secondary | ICD-10-CM | POA: Diagnosis not present

## 2024-01-09 DIAGNOSIS — J40 Bronchitis, not specified as acute or chronic: Secondary | ICD-10-CM | POA: Diagnosis not present

## 2024-01-09 DIAGNOSIS — Z48812 Encounter for surgical aftercare following surgery on the circulatory system: Secondary | ICD-10-CM | POA: Diagnosis not present

## 2024-01-09 DIAGNOSIS — Z794 Long term (current) use of insulin: Secondary | ICD-10-CM | POA: Diagnosis not present

## 2024-01-09 DIAGNOSIS — D649 Anemia, unspecified: Secondary | ICD-10-CM | POA: Diagnosis not present

## 2024-01-09 DIAGNOSIS — I252 Old myocardial infarction: Secondary | ICD-10-CM | POA: Diagnosis not present

## 2024-01-09 DIAGNOSIS — E7800 Pure hypercholesterolemia, unspecified: Secondary | ICD-10-CM | POA: Diagnosis not present

## 2024-01-09 DIAGNOSIS — Z7982 Long term (current) use of aspirin: Secondary | ICD-10-CM | POA: Diagnosis not present

## 2024-01-09 DIAGNOSIS — Z87891 Personal history of nicotine dependence: Secondary | ICD-10-CM | POA: Diagnosis not present

## 2024-01-09 DIAGNOSIS — I25110 Atherosclerotic heart disease of native coronary artery with unstable angina pectoris: Secondary | ICD-10-CM | POA: Diagnosis not present

## 2024-01-09 DIAGNOSIS — Z79891 Long term (current) use of opiate analgesic: Secondary | ICD-10-CM | POA: Diagnosis not present

## 2024-01-09 DIAGNOSIS — Z951 Presence of aortocoronary bypass graft: Secondary | ICD-10-CM | POA: Diagnosis not present

## 2024-01-09 DIAGNOSIS — E538 Deficiency of other specified B group vitamins: Secondary | ICD-10-CM | POA: Diagnosis not present

## 2024-01-09 DIAGNOSIS — M10061 Idiopathic gout, right knee: Secondary | ICD-10-CM | POA: Diagnosis not present

## 2024-01-09 DIAGNOSIS — I5032 Chronic diastolic (congestive) heart failure: Secondary | ICD-10-CM | POA: Diagnosis not present

## 2024-01-09 DIAGNOSIS — Z7984 Long term (current) use of oral hypoglycemic drugs: Secondary | ICD-10-CM | POA: Diagnosis not present

## 2024-01-09 NOTE — Telephone Encounter (Signed)
 Pt insurance is active and benefits verified through [facility]. Co-pay $0.00, DED $0.00/$0.00 met, out of pocket $3,900.00/$415.00 met, co-insurance 0%. No pre-authorization required. Passport, 01/09/24 @ 3:48PM, REF#[id number]   How many CR sessions are covered? (36 visits for TCR, 72 visits for ICR)72 Is this a lifetime maximum or an annual maximum? Annual Has the member used any of these services to date? No Is there a time limit (weeks/months) on start of program and/or program completion? No     Will contact patient to see if he is interested in the Cardiac Rehab Program. If interested, patient will need to complete follow up appt. Once completed, patient will be contacted for scheduling upon review by the RN Navigator.

## 2024-01-10 ENCOUNTER — Ambulatory Visit: Admitting: Physician Assistant

## 2024-01-11 DIAGNOSIS — Z794 Long term (current) use of insulin: Secondary | ICD-10-CM | POA: Diagnosis not present

## 2024-01-11 DIAGNOSIS — Z48812 Encounter for surgical aftercare following surgery on the circulatory system: Secondary | ICD-10-CM | POA: Diagnosis not present

## 2024-01-11 DIAGNOSIS — Z951 Presence of aortocoronary bypass graft: Secondary | ICD-10-CM | POA: Diagnosis not present

## 2024-01-11 DIAGNOSIS — I5032 Chronic diastolic (congestive) heart failure: Secondary | ICD-10-CM | POA: Diagnosis not present

## 2024-01-11 DIAGNOSIS — E538 Deficiency of other specified B group vitamins: Secondary | ICD-10-CM | POA: Diagnosis not present

## 2024-01-11 DIAGNOSIS — Z87891 Personal history of nicotine dependence: Secondary | ICD-10-CM | POA: Diagnosis not present

## 2024-01-11 DIAGNOSIS — Z79891 Long term (current) use of opiate analgesic: Secondary | ICD-10-CM | POA: Diagnosis not present

## 2024-01-11 DIAGNOSIS — I25110 Atherosclerotic heart disease of native coronary artery with unstable angina pectoris: Secondary | ICD-10-CM | POA: Diagnosis not present

## 2024-01-11 DIAGNOSIS — I252 Old myocardial infarction: Secondary | ICD-10-CM | POA: Diagnosis not present

## 2024-01-11 DIAGNOSIS — M10061 Idiopathic gout, right knee: Secondary | ICD-10-CM | POA: Diagnosis not present

## 2024-01-11 DIAGNOSIS — D649 Anemia, unspecified: Secondary | ICD-10-CM | POA: Diagnosis not present

## 2024-01-11 DIAGNOSIS — M5412 Radiculopathy, cervical region: Secondary | ICD-10-CM | POA: Diagnosis not present

## 2024-01-11 DIAGNOSIS — J40 Bronchitis, not specified as acute or chronic: Secondary | ICD-10-CM | POA: Diagnosis not present

## 2024-01-11 DIAGNOSIS — E1142 Type 2 diabetes mellitus with diabetic polyneuropathy: Secondary | ICD-10-CM | POA: Diagnosis not present

## 2024-01-11 DIAGNOSIS — Z7982 Long term (current) use of aspirin: Secondary | ICD-10-CM | POA: Diagnosis not present

## 2024-01-11 DIAGNOSIS — Z7984 Long term (current) use of oral hypoglycemic drugs: Secondary | ICD-10-CM | POA: Diagnosis not present

## 2024-01-11 DIAGNOSIS — I110 Hypertensive heart disease with heart failure: Secondary | ICD-10-CM | POA: Diagnosis not present

## 2024-01-11 DIAGNOSIS — E7800 Pure hypercholesterolemia, unspecified: Secondary | ICD-10-CM | POA: Diagnosis not present

## 2024-01-11 DIAGNOSIS — Z955 Presence of coronary angioplasty implant and graft: Secondary | ICD-10-CM | POA: Diagnosis not present

## 2024-01-12 LAB — GLUCOSE, CAPILLARY: Glucose-Capillary: 157 mg/dL — ABNORMAL HIGH (ref 70–99)

## 2024-01-13 DIAGNOSIS — Z7984 Long term (current) use of oral hypoglycemic drugs: Secondary | ICD-10-CM | POA: Diagnosis not present

## 2024-01-13 DIAGNOSIS — Z48812 Encounter for surgical aftercare following surgery on the circulatory system: Secondary | ICD-10-CM | POA: Diagnosis not present

## 2024-01-13 DIAGNOSIS — Z951 Presence of aortocoronary bypass graft: Secondary | ICD-10-CM | POA: Diagnosis not present

## 2024-01-13 DIAGNOSIS — Z955 Presence of coronary angioplasty implant and graft: Secondary | ICD-10-CM | POA: Diagnosis not present

## 2024-01-13 DIAGNOSIS — I25110 Atherosclerotic heart disease of native coronary artery with unstable angina pectoris: Secondary | ICD-10-CM | POA: Diagnosis not present

## 2024-01-13 DIAGNOSIS — E538 Deficiency of other specified B group vitamins: Secondary | ICD-10-CM | POA: Diagnosis not present

## 2024-01-13 DIAGNOSIS — I252 Old myocardial infarction: Secondary | ICD-10-CM | POA: Diagnosis not present

## 2024-01-13 DIAGNOSIS — J40 Bronchitis, not specified as acute or chronic: Secondary | ICD-10-CM | POA: Diagnosis not present

## 2024-01-13 DIAGNOSIS — I110 Hypertensive heart disease with heart failure: Secondary | ICD-10-CM | POA: Diagnosis not present

## 2024-01-13 DIAGNOSIS — D649 Anemia, unspecified: Secondary | ICD-10-CM | POA: Diagnosis not present

## 2024-01-13 DIAGNOSIS — M5412 Radiculopathy, cervical region: Secondary | ICD-10-CM | POA: Diagnosis not present

## 2024-01-13 DIAGNOSIS — Z7982 Long term (current) use of aspirin: Secondary | ICD-10-CM | POA: Diagnosis not present

## 2024-01-13 DIAGNOSIS — E7800 Pure hypercholesterolemia, unspecified: Secondary | ICD-10-CM | POA: Diagnosis not present

## 2024-01-13 DIAGNOSIS — Z794 Long term (current) use of insulin: Secondary | ICD-10-CM | POA: Diagnosis not present

## 2024-01-13 DIAGNOSIS — E1142 Type 2 diabetes mellitus with diabetic polyneuropathy: Secondary | ICD-10-CM | POA: Diagnosis not present

## 2024-01-13 DIAGNOSIS — Z87891 Personal history of nicotine dependence: Secondary | ICD-10-CM | POA: Diagnosis not present

## 2024-01-13 DIAGNOSIS — Z79891 Long term (current) use of opiate analgesic: Secondary | ICD-10-CM | POA: Diagnosis not present

## 2024-01-13 DIAGNOSIS — M10061 Idiopathic gout, right knee: Secondary | ICD-10-CM | POA: Diagnosis not present

## 2024-01-13 DIAGNOSIS — I5032 Chronic diastolic (congestive) heart failure: Secondary | ICD-10-CM | POA: Diagnosis not present

## 2024-01-16 ENCOUNTER — Telehealth: Payer: Self-pay | Admitting: Cardiovascular Disease

## 2024-01-16 DIAGNOSIS — Z7984 Long term (current) use of oral hypoglycemic drugs: Secondary | ICD-10-CM | POA: Diagnosis not present

## 2024-01-16 DIAGNOSIS — E7800 Pure hypercholesterolemia, unspecified: Secondary | ICD-10-CM | POA: Diagnosis not present

## 2024-01-16 DIAGNOSIS — D649 Anemia, unspecified: Secondary | ICD-10-CM | POA: Diagnosis not present

## 2024-01-16 DIAGNOSIS — Z951 Presence of aortocoronary bypass graft: Secondary | ICD-10-CM | POA: Diagnosis not present

## 2024-01-16 DIAGNOSIS — I252 Old myocardial infarction: Secondary | ICD-10-CM | POA: Diagnosis not present

## 2024-01-16 DIAGNOSIS — I5032 Chronic diastolic (congestive) heart failure: Secondary | ICD-10-CM | POA: Diagnosis not present

## 2024-01-16 DIAGNOSIS — Z7982 Long term (current) use of aspirin: Secondary | ICD-10-CM | POA: Diagnosis not present

## 2024-01-16 DIAGNOSIS — Z955 Presence of coronary angioplasty implant and graft: Secondary | ICD-10-CM | POA: Diagnosis not present

## 2024-01-16 DIAGNOSIS — M5412 Radiculopathy, cervical region: Secondary | ICD-10-CM | POA: Diagnosis not present

## 2024-01-16 DIAGNOSIS — J40 Bronchitis, not specified as acute or chronic: Secondary | ICD-10-CM | POA: Diagnosis not present

## 2024-01-16 DIAGNOSIS — M10061 Idiopathic gout, right knee: Secondary | ICD-10-CM | POA: Diagnosis not present

## 2024-01-16 DIAGNOSIS — I25110 Atherosclerotic heart disease of native coronary artery with unstable angina pectoris: Secondary | ICD-10-CM | POA: Diagnosis not present

## 2024-01-16 DIAGNOSIS — Z48812 Encounter for surgical aftercare following surgery on the circulatory system: Secondary | ICD-10-CM | POA: Diagnosis not present

## 2024-01-16 DIAGNOSIS — I110 Hypertensive heart disease with heart failure: Secondary | ICD-10-CM | POA: Diagnosis not present

## 2024-01-16 DIAGNOSIS — E538 Deficiency of other specified B group vitamins: Secondary | ICD-10-CM | POA: Diagnosis not present

## 2024-01-16 DIAGNOSIS — Z794 Long term (current) use of insulin: Secondary | ICD-10-CM | POA: Diagnosis not present

## 2024-01-16 DIAGNOSIS — Z79891 Long term (current) use of opiate analgesic: Secondary | ICD-10-CM | POA: Diagnosis not present

## 2024-01-16 DIAGNOSIS — E1142 Type 2 diabetes mellitus with diabetic polyneuropathy: Secondary | ICD-10-CM | POA: Diagnosis not present

## 2024-01-16 DIAGNOSIS — Z87891 Personal history of nicotine dependence: Secondary | ICD-10-CM | POA: Diagnosis not present

## 2024-01-16 NOTE — Telephone Encounter (Addendum)
 Pts daughter called and we went over his current med list...he was on Brilinta  prior to going into the hospital and she says that we may not have known about it since they did not report that to the admitting MD... he has also been having some discomfort to the touch of his chest...no SOB.. I advised her that it is early into the healing process and she needs to call the surgeon about the Brilinta ... not to take anything not on his current med list until she discusses with surgery or at his OV 01/20/24.

## 2024-01-16 NOTE — Telephone Encounter (Signed)
 Pts daughter would like a c/b to go over medications.

## 2024-01-16 NOTE — Progress Notes (Signed)
 Cardiology Office Note:    Date:  01/20/2024   ID:  Scott Donaldson, DOB 13-Jun-1951, MRN [id number]  PCP:  Scott Mliss DELENA, FNP   [facility] Providers Cardiologist:  Maude Emmer, MD Cardiology APP:  Madie Jon Garre, PA     Referring MD: Scott Mliss DELENA, FNP   Chief Complaint  Patient presents with   Follow-up   Hospitalization Follow-up    Post CABG    History of Present Illness:    Scott Donaldson is a 73 y.o. male with a hx of CAD with prior DES-LCx and now s/p CABG x 3 12/2023, hypertension, hyperlipidemia, BPH s/p TURP, DM.  He underwent CABG x 3 with LIMA-LAD, SVG-OM, SVG-PDA on 12/27/2023.  Echo with LVEF 55-60% with grade 1 DD, no significant valvular disease.   He presents for cardiology follow up. Expected chest soreness. No SOB. Walking some but weather has been extremely hot (upper 90s this week).    Past Medical History:  Diagnosis Date   Abdominal pain 08/04/2016   Abnormal PSA 05/19/2015   Anemia    Arthralgia 11/12/2010   Benign prostatic hyperplasia with urinary hesitancy 06/14/2018   Cataract    Coronary artery disease    nonobstructive   Diabetes (HCC) 03/04/2007   Qualifier: Diagnosis of  By: Kassie MD, Sean A    DIABETES MELLITUS, TYPE II 03/04/2007   Elevated LDL cholesterol level 06/14/2018   Foot ulcer, right (HCC) 04/15/2014   GERD (gastroesophageal reflux disease)    HOH (hard of hearing)    both ears no hearing aides   HYPERCHOLESTEROLEMIA 07/29/2008   HYPERTENSION 03/04/2007   Knee pain, left 05/16/2015   Nonintractable episodic headache 12/01/2016   migraine   SHOULDER PAIN, BILATERAL 01/12/2010   Qualifier: Diagnosis of  By: Kassie MD, Sean A    TUBULOVILLOUS ADENOMA, 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   Qualifier: Diagnosis of  By: Kassie MD, Scott Donaldson     Past Surgical History:  Procedure Laterality Date   CARDIAC CATHETERIZATION     COLONOSCOPY  07/2017   CORONARY ARTERY BYPASS GRAFT N/A 12/27/2023   Procedure: CORONARY ARTERY BYPASS  GRAFTING (CABG) TIMES THREE USING LEFT INTERNAL MAMMARY ARTERY AND ENDOSCOPICALLY HARVESTED RIGHT GREATER SAPHENOUS VEIN;  Surgeon: Shyrl Linnie KIDD, MD;  Location: [facility];  Service: Open Heart Surgery;  Laterality: N/A;   CORONARY PRESSURE/FFR WITH 3D MAPPING N/A 12/26/2023   Procedure: Coronary Pressure/FFR w/3D Mapping;  Surgeon: Wendel Lurena POUR, MD;  Location: [facility];  Service: Cardiovascular;  Laterality: N/A;   CORONARY STENT INTERVENTION N/A 10/22/2021   Procedure: CORONARY STENT INTERVENTION;  Surgeon: Wendel Lurena POUR, MD;  Location: [facility];  Service: Cardiovascular;  Laterality: N/A;   LEFT HEART CATH AND CORONARY ANGIOGRAPHY N/A 07/12/2018   Procedure: LEFT HEART CATH AND CORONARY ANGIOGRAPHY;  Surgeon: Scott Deatrice DELENA, MD;  Location: [facility];  Service: Cardiovascular;  Laterality: N/A;   LEFT HEART CATH AND CORONARY ANGIOGRAPHY N/A 10/22/2021   Procedure: LEFT HEART CATH AND CORONARY ANGIOGRAPHY;  Surgeon: Wendel Lurena POUR, MD;  Location: [facility];  Service: Cardiovascular;  Laterality: N/A;   LEFT HEART CATH AND CORONARY ANGIOGRAPHY N/A 12/26/2023   Procedure: LEFT HEART CATH AND CORONARY ANGIOGRAPHY;  Surgeon: Wendel Lurena POUR, MD;  Location: [facility];  Service: Cardiovascular;  Laterality: N/A;   stress cardiolite  05/30/2003   TEE WITHOUT CARDIOVERSION N/A 12/27/2023   Procedure: ECHOCARDIOGRAM, TRANSESOPHAGEAL;  Surgeon: Shyrl Linnie KIDD,  MD;  Location: [facility];  Service: Open Heart Surgery;  Laterality: N/A;   TRANSURETHRAL RESECTION OF PROSTATE N/A 06/04/2020   Procedure: TRANSURETHRAL RESECTION OF THE PROSTATE (TURP), BIPOLAR;  Surgeon: Devere Lonni Righter, MD;  Location: [facility];  Service: Urology;  Laterality: N/A;    Current Medications: Current Meds  Medication Sig   Accu-Chek Softclix Lancets lancets Check blood sugar 2 times daily   amLODipine  (NORVASC ) 5 MG tablet Take 0.5 tablets (2.5  mg total) by mouth daily.   aspirin  EC 325 MG tablet Take 1 tablet (325 mg total) by mouth daily.   atorvastatin  (LIPITOR ) 80 MG tablet Take 1 tablet (80 mg total) by mouth daily.   Blood Glucose Monitoring Suppl (ACCU-CHEK GUIDE ME) w/Device KIT Check blood sugars 2 times daily   fenofibrate  160 MG tablet Take 1 tablet (160 mg total) by mouth daily.   furosemide  (LASIX ) 40 MG tablet Take 1 tablet (40 mg total) by mouth daily for 7 days.   glucose blood (ACCU-CHEK GUIDE) test strip Check blood sugars 2 times daily   glucose blood test strip 1 each by Other route 2 (two) times daily. And lancets 2/day   insulin  glargine (LANTUS  SOLOSTAR) 100 UNIT/ML Solostar Pen Inject 30 Units into the skin at bedtime.   Insulin  Lispro-aabc (LYUMJEV  KWIKPEN) 100 UNIT/ML KwikPen Inject 8-12 Units into the skin 3 (three) times daily before meals.   Insulin  Pen Needle 32G X 4 MM MISC Use as directed up to four times daily   Iron , Ferrous Sulfate , 325 (65 Fe) MG TABS Take one daily   metFORMIN  (GLUCOPHAGE -XR) 500 MG 24 hr tablet Take 1 tablet (500 mg total) by mouth daily with breakfast.   metoprolol  succinate (TOPROL  XL) 25 MG 24 hr tablet Take 1 tablet (25 mg total) by mouth daily.   [DISCONTINUED] metoprolol  tartrate (LOPRESSOR ) 25 MG tablet Take 1 tablet (25 mg total) by mouth 2 (two) times daily.     Allergies:   Patient has no known allergies.   Social History   Socioeconomic History   Marital status: Single    Spouse name: Not on file   Number of children: 2   Years of education: 10   Highest education level: 10th grade  Occupational History   Occupation: roofer   Occupation: Retired  Tobacco Use   Smoking status: Former    Current packs/day: 0.00    Average packs/day: 1.5 packs/day for 25.0 years (37.5 ttl pk-yrs)    Types: Cigarettes    Start date: 07/27/1963    Quit date: 07/26/1988    Years since quitting: 35.5   Smokeless tobacco: Never  Vaping Use   Vaping status: Never Used  Substance  and Sexual Activity   Alcohol use: No   Drug use: No   Sexual activity: Not Currently  Other Topics Concern   Not on file  Social History Narrative   Lives with daughter in a one story home.  Has 2 children.  Semi-retired.  Works as a Designer, fashion/clothing.  Education: 10th grade.    Social Drivers of Corporate investment banker Strain: Low Risk  (11/13/2021)   Overall Financial Resource Strain (CARDIA)    Difficulty of Paying Living Expenses: Not hard at all  Food Insecurity: Low Risk  (01/05/2024)   Received from [facility]   Hunger Vital Sign    Within the past 12 months, you worried that your food would run out before you got money to buy more: Never true  Within the past 12 months, the food you bought just didn't last and you didn't have money to get more. : Never true  Recent Concern: Food Insecurity - Food Insecurity Present (12/25/2023)   Hunger Vital Sign    Worried About Running Out of Food in the Last Year: Never true    Ran Out of Food in the Last Year: Sometimes true  Transportation Needs: No Transportation Needs (01/05/2024)   Received from [facility]    In the past 12 months, has lack of reliable transportation kept you from medical appointments, meetings, work or from getting things needed for daily living? : No  Physical Activity: Insufficiently Active (11/13/2021)   Exercise Vital Sign    Days of Exercise per Week: 2 days    Minutes of Exercise per Session: 20 min  Stress: No Stress Concern Present (11/13/2021)   [employer] of Occupational Health - Occupational Stress Questionnaire    Feeling of Stress : Not at all  Social Connections: Moderately Isolated (12/25/2023)   Social Connection and Isolation Panel    Frequency of Communication with Friends and Family: Never    Frequency of Social Gatherings with Friends and Family: More than three times a week    Attends Religious Services: More than 4 times per year    Active Member of [employer] or  Organizations: No    Attends Banker Meetings: Never    Marital Status: Widowed     Family History: The patient's family history includes Cancer in his father and mother; Cancer (age of onset: 55) in his brother; Diabetes in his father. There is no history of Colon cancer, Rectal cancer, or Stomach cancer.  ROS:   Please see the history of present illness.     All other systems reviewed and are negative.  EKGs/Labs/Other Studies Reviewed:    The following studies were reviewed today:  EKG Interpretation Date/Time:  Friday January 20 2024 09:06:05 EDT Ventricular Rate:  58 PR Interval:  180 QRS Duration:  134 QT Interval:  426 QTC Calculation: 418 R Axis:   -61  Text Interpretation: Sinus bradycardia with sinus arrhythmia Left axis deviation Non-specific intra-ventricular conduction block - LBBB pattern Minimal voltage criteria for LVH, may be normal variant ( Cornell product ) Possible Lateral infarct (cited on or before 28-Dec-2023) When compared with ECG of 28-Dec-2023 06:53, Serial changes of Lateral infarct Present Confirmed by Madie Slough ([id number]) on 01/20/2024 9:28:02 AM    Recent Labs: 12/29/2023: Magnesium  2.5 01/02/2024: BUN 23; Creatinine, Ser 1.05; Hemoglobin 8.4; Platelets 206; Potassium 4.4; Sodium 138  Recent Lipid Panel    Component Value Date/Time   CHOL 277 (H) 12/26/2023 0446   CHOL 166 02/27/2021 0727   TRIG 403 (H) 12/26/2023 0446   HDL 37 (L) 12/26/2023 0446   HDL 33 (L) 02/27/2021 0727   CHOLHDL 7.5 12/26/2023 0446   VLDL UNABLE TO CALCULATE IF TRIGLYCERIDE OVER 400 mg/dL 93/97/7974 9553   LDLCALC UNABLE TO CALCULATE IF TRIGLYCERIDE OVER 400 mg/dL 93/97/7974 9553   LDLCALC 93 02/27/2021 0727   LDLDIRECT 186 (H) 12/26/2023 0446     Risk Assessment/Calculations:                Physical Exam:    VS:  BP 138/64 (BP Location: Left Arm, Patient Position: Sitting, Cuff Size: Normal)   Pulse (!) 58   Ht 6' (1.829 m)   Wt 173 lb 12.8 oz  (78.8 kg)   SpO2 98%  BMI 23.57 kg/m     Wt Readings from Last 3 Encounters:  01/20/24 173 lb 12.8 oz (78.8 kg)  01/03/24 181 lb 14.1 oz (82.5 kg)  09/15/23 177 lb 0.5 oz (80.3 kg)     GEN:  Well nourished, well developed in no acute distress HEENT: Normal NECK: No JVD; No carotid bruits LYMPHATICS: No lymphadenopathy CARDIAC: RRR, no murmurs, rubs, gallops RESPIRATORY:  Clear to auscultation without rales, wheezing or rhonchi  ABDOMEN: Soft, non-tender, non-distended MUSCULOSKELETAL:  No edema; No deformity  SKIN: Warm and dry NEUROLOGIC:  Alert and oriented x 3 PSYCHIATRIC:  Normal affect  Sternotomy healing well  ASSESSMENT:    1. Primary hypertension   2. Coronary artery disease involving native coronary artery of native heart without angina pectoris   3. S/P CABG x 3   4. Hyperlipidemia with target LDL less than 70   5. Type 2 diabetes mellitus with diabetic polyneuropathy, with long-term current use of insulin  (HCC)    PLAN:    In order of problems listed above:  CAD DES-LCx CABG x 3 in 12/2023 - on ASA 325 mg --> will defer to surgeon to reduce ASA to 81 mg - continue lipitor  and BB   Hypertension - on BB - 25 mg lopressor  BID --> changing to toprol  for ease of administration - BP borderline - will start 2.5 mg amlodipine  - give 5 mg tablets   Hyperlipidemia with LDL goal < 70 12/26/2023: Cholesterol 277; HDL 37; LDL Cholesterol UNABLE TO CALCULATE IF TRIGLYCERIDE OVER 400 mg/dL; Triglycerides 403; VLDL UNABLE TO CALCULATE IF TRIGLYCERIDE OVER 400 mg/dL - continue 80 mg lipitor , fenofibrate  - referral to lipid clinic - will redraw with LPA   DM with hyperglycemia A1c 13.3% - on insulin  Consider SGTL2i when A1c is closer to 10 - they are switching to [facility]   Follow up in 6 weeks.      Cardiac Rehabilitation Eligibility Assessment  The patient is ready to start cardiac rehabilitation pending clearance from the cardiac surgeon.           Medication Adjustments/Labs and Tests Ordered: Current medicines are reviewed at length with the patient today.  Concerns regarding medicines are outlined above.  Orders Placed This Encounter  Procedures   EKG 12-Lead   Meds ordered this encounter  Medications   amLODipine  (NORVASC ) 5 MG tablet    Sig: Take 0.5 tablets (2.5 mg total) by mouth daily.    Dispense:  90 tablet    Refill:  3   metoprolol  succinate (TOPROL  XL) 25 MG 24 hr tablet    Sig: Take 1 tablet (25 mg total) by mouth daily.    Dispense:  90 tablet    Refill:  3    Patient Instructions  Medication Instructions:  START Amlodipine  2.5mg . take one tablet daily.  START Metoprolol  Succinate (Toprol  25mg ) Take one tablet daily.   *If you need a refill on your cardiac medications before your next appointment, please call your pharmacy*   Lab Work: Fasting labs will be drawn today...................SABRA LIPID , LPa, DIRECT LDL, A1C If you have labs (blood work) drawn today and your tests are completely normal, you will receive your results only by: MyChart Message (if you have MyChart) OR A paper copy in the mail If you have any lab test that is abnormal or we need to change your treatment, we will call you to review the results.   Testing/Procedures: No procedures were ordered during today's visit.  Follow-Up: At [facility], you and your health needs are our priority.  As part of our continuing mission to provide you with exceptional heart care, we have created designated Provider Care Teams.  These Care Teams include your primary Cardiologist (physician) and Advanced Practice Providers (APPs -  Physician Assistants and Nurse Practitioners) who all work together to provide you with the care you need, when you need it.  We recommend signing up for the patient portal called MyChart.  Sign up information is provided on this After Visit Summary.  MyChart is used to connect with patients for  Virtual Visits (Telemedicine).  Patients are able to view lab/test results, encounter notes, upcoming appointments, etc.  Non-urgent messages can be sent to your provider as well.   To learn more about what you can do with MyChart, go to [email or web address].      Provider:  Jon Hails   Other Instructions Thank you for choosing [facility]!       Signed, Jon Nat Hails, [place]  01/20/2024 9:56 AM    [facility]

## 2024-01-20 ENCOUNTER — Ambulatory Visit
Payer: Self-pay | Attending: Thoracic Surgery (Cardiothoracic Vascular Surgery) | Admitting: Thoracic Surgery (Cardiothoracic Vascular Surgery)

## 2024-01-20 ENCOUNTER — Ambulatory Visit: Attending: Physician Assistant | Admitting: Physician Assistant

## 2024-01-20 ENCOUNTER — Encounter: Payer: Self-pay | Admitting: Physician Assistant

## 2024-01-20 VITALS — BP 138/64 | HR 58 | Ht 72.0 in | Wt 173.8 lb

## 2024-01-20 DIAGNOSIS — E785 Hyperlipidemia, unspecified: Secondary | ICD-10-CM

## 2024-01-20 DIAGNOSIS — Z951 Presence of aortocoronary bypass graft: Secondary | ICD-10-CM | POA: Diagnosis not present

## 2024-01-20 DIAGNOSIS — E1142 Type 2 diabetes mellitus with diabetic polyneuropathy: Secondary | ICD-10-CM | POA: Diagnosis not present

## 2024-01-20 DIAGNOSIS — I10 Essential (primary) hypertension: Secondary | ICD-10-CM

## 2024-01-20 DIAGNOSIS — I2510 Atherosclerotic heart disease of native coronary artery without angina pectoris: Secondary | ICD-10-CM

## 2024-01-20 DIAGNOSIS — I214 Non-ST elevation (NSTEMI) myocardial infarction: Secondary | ICD-10-CM

## 2024-01-20 DIAGNOSIS — Z794 Long term (current) use of insulin: Secondary | ICD-10-CM | POA: Diagnosis not present

## 2024-01-20 LAB — LDL CHOLESTEROL, DIRECT

## 2024-01-20 MED ORDER — METOPROLOL SUCCINATE ER 25 MG PO TB24: 25.0000 mg | ORAL_TABLET | Freq: Every day | ORAL | 3 refills | Status: DC

## 2024-01-20 MED ORDER — AMLODIPINE BESYLATE 5 MG PO TABS: 2.5000 mg | ORAL_TABLET | Freq: Every day | ORAL | 3 refills | Status: DC

## 2024-01-20 NOTE — Patient Instructions (Addendum)
 Medication Instructions:  START Amlodipine  2.5mg . take one tablet daily.  START Metoprolol  Succinate (Toprol  25mg ) Take one tablet daily.   *If you need a refill on your cardiac medications before your next appointment, please call your pharmacy*   Lab Work: Fasting labs will be drawn today...................SABRA LIPID , LPa, DIRECT LDL, A1C If you have labs (blood work) drawn today and your tests are completely normal, you will receive your results only by: MyChart Message (if you have MyChart) OR A paper copy in the mail If you have any lab test that is abnormal or we need to change your treatment, we will call you to review the results.   Testing/Procedures: No procedures were ordered during today's visit.    Follow-Up: At [facility], you and your health needs are our priority.  As part of our continuing mission to provide you with exceptional heart care, we have created designated Provider Care Teams.  These Care Teams include your primary Cardiologist (physician) and Advanced Practice Providers (APPs -  Physician Assistants and Nurse Practitioners) who all work together to provide you with the care you need, when you need it.  We recommend signing up for the patient portal called MyChart.  Sign up information is provided on this After Visit Summary.  MyChart is used to connect with patients for Virtual Visits (Telemedicine).  Patients are able to view lab/test results, encounter notes, upcoming appointments, etc.  Non-urgent messages can be sent to your provider as well.   To learn more about what you can do with MyChart, go to [email or web address].      Provider:  Jon Hails   Other Instructions Thank you for choosing [facility]!

## 2024-01-20 NOTE — Progress Notes (Signed)
     [address]       [place] [address]             [phone]       Patient: Home Provider: Office Consent for Telemedicine visit obtained.  Today's visit was completed via a real-time telehealth (see specific modality noted below). The patient/authorized person provided oral consent at the time of the visit to engage in a telemedicine encounter with the present provider at [facility]. The patient/authorized person was informed of the potential benefits, limitations, and risks of telemedicine. The patient/authorized person expressed understanding that the laws that protect confidentiality also apply to telemedicine. The patient/authorized person acknowledged understanding that telemedicine does not provide emergency services and that he or she would need to call 911 or proceed to the nearest hospital for help if such a need arose.   Total time spent in the clinical discussion 10 minutes.  Telehealth Modality: Phone visit (audio only)  I had a telephone visit with  Scott Donaldson who is s/p CABG.  Overall doing well.  Pain is minimal.  Ambulating well. Vitals have been stable.  Scott Donaldson will see us  back in 1 month with a chest x-ray for cardiac rehab clearance.  Scott Donaldson

## 2024-01-21 LAB — LIPID PANEL
Chol/HDL Ratio: 4.1 ratio (ref 0.0–5.0)
Cholesterol, Total: 155 mg/dL (ref 100–199)
HDL: 38 mg/dL — AB (ref >39)
LDL Chol Calc (NIH): 95 mg/dL (ref 0–99)
Triglycerides: 123 mg/dL (ref 0–149)
VLDL Cholesterol Cal: 22 mg/dL (ref 5–40)

## 2024-01-21 LAB — LIPOPROTEIN A (LPA): Lipoprotein (a): 9 nmol/L (ref <75.0)

## 2024-01-21 LAB — HEMOGLOBIN A1C
Est. average glucose Bld gHb Est-mCnc: 260 mg/dL
Hgb A1c MFr Bld: 10.7 % — ABNORMAL HIGH (ref 4.8–5.6)

## 2024-01-21 LAB — LDL CHOLESTEROL, DIRECT: LDL Direct: 88 mg/dL (ref 0–99)

## 2024-01-23 ENCOUNTER — Other Ambulatory Visit: Payer: Self-pay

## 2024-01-23 DIAGNOSIS — Z978 Presence of other specified devices: Secondary | ICD-10-CM | POA: Diagnosis not present

## 2024-01-23 DIAGNOSIS — Z8631 Personal history of diabetic foot ulcer: Secondary | ICD-10-CM | POA: Diagnosis not present

## 2024-01-23 DIAGNOSIS — Z809 Family history of malignant neoplasm, unspecified: Secondary | ICD-10-CM | POA: Diagnosis not present

## 2024-01-23 DIAGNOSIS — Z794 Long term (current) use of insulin: Secondary | ICD-10-CM | POA: Diagnosis not present

## 2024-01-23 DIAGNOSIS — Z79899 Other long term (current) drug therapy: Secondary | ICD-10-CM | POA: Diagnosis not present

## 2024-01-23 DIAGNOSIS — E1142 Type 2 diabetes mellitus with diabetic polyneuropathy: Secondary | ICD-10-CM | POA: Diagnosis not present

## 2024-01-23 DIAGNOSIS — E7800 Pure hypercholesterolemia, unspecified: Secondary | ICD-10-CM | POA: Diagnosis not present

## 2024-01-23 DIAGNOSIS — E1165 Type 2 diabetes mellitus with hyperglycemia: Secondary | ICD-10-CM | POA: Diagnosis not present

## 2024-01-23 DIAGNOSIS — Z7984 Long term (current) use of oral hypoglycemic drugs: Secondary | ICD-10-CM | POA: Diagnosis not present

## 2024-01-23 DIAGNOSIS — Z951 Presence of aortocoronary bypass graft: Secondary | ICD-10-CM | POA: Diagnosis not present

## 2024-01-23 MED ORDER — LANTUS SOLOSTAR 100 UNIT/ML ~~LOC~~ SOPN
30.0000 [IU] | PEN_INJECTOR | Freq: Every morning | SUBCUTANEOUS | 1 refills | Status: AC
  Filled 2024-04-20: qty 9, 30d supply, fill #0

## 2024-01-23 MED ORDER — METFORMIN HCL ER 500 MG PO TB24
500.0000 mg | ORAL_TABLET | Freq: Every day | ORAL | 1 refills | Status: AC
  Filled 2024-01-23 – 2024-03-22 (×2): qty 90, 90d supply, fill #0
  Filled 2024-07-01: qty 90, 90d supply, fill #1
  Filled 2024-07-06: qty 90, 90d supply, fill #0

## 2024-01-23 MED ORDER — INSULIN PEN NEEDLE 32G X 4 MM MISC
3 refills | Status: AC
  Filled 2024-01-23 – 2024-03-22 (×2): qty 400, 100d supply, fill #0

## 2024-01-23 MED ORDER — DAPAGLIFLOZIN PROPANEDIOL 10 MG PO TABS
10.0000 mg | ORAL_TABLET | Freq: Every day | ORAL | 1 refills | Status: AC
  Filled 2024-01-23 – 2024-03-22 (×2): qty 90, 90d supply, fill #0

## 2024-01-23 MED ORDER — LYUMJEV KWIKPEN 100 UNIT/ML ~~LOC~~ SOPN
8.0000 [IU] | PEN_INJECTOR | SUBCUTANEOUS | 1 refills | Status: AC
  Filled 2024-01-23 – 2024-03-22 (×2): qty 45, 90d supply, fill #0

## 2024-01-24 ENCOUNTER — Other Ambulatory Visit: Payer: Self-pay

## 2024-01-24 ENCOUNTER — Ambulatory Visit: Payer: Self-pay | Admitting: Physician Assistant

## 2024-01-24 DIAGNOSIS — E785 Hyperlipidemia, unspecified: Secondary | ICD-10-CM

## 2024-01-26 NOTE — Addendum Note (Signed)
 Addended by: Lauralei Clouse on: 01/26/2024 02:41 PM   Modules accepted: Orders

## 2024-01-26 NOTE — Telephone Encounter (Signed)
 LMOM, to discuss lab results. Waiting on a return call.

## 2024-01-26 NOTE — Telephone Encounter (Signed)
 Pts daughter returned call. She was notified of results. Pt does want to see our clinical pharmacy to discuss lipid lowering therapy. Referral placed. Please arrange pharm-D apt for lipid lowering.

## 2024-02-01 ENCOUNTER — Other Ambulatory Visit: Payer: Self-pay

## 2024-02-03 ENCOUNTER — Other Ambulatory Visit: Payer: Self-pay

## 2024-02-07 ENCOUNTER — Other Ambulatory Visit (HOSPITAL_COMMUNITY): Payer: Self-pay

## 2024-02-07 ENCOUNTER — Other Ambulatory Visit: Payer: Self-pay

## 2024-02-08 ENCOUNTER — Ambulatory Visit
Attending: Pharmacist Clinician (PhC)/ Clinical Pharmacy Specialist | Admitting: Pharmacist Clinician (PhC)/ Clinical Pharmacy Specialist

## 2024-02-08 NOTE — Progress Notes (Deleted)
 Office Visit    Patient Name: Scott Donaldson Date of Encounter: 02/08/2024  Primary Care Provider:  Patti Mliss DELENA, FNP Primary Cardiologist:  Maude Emmer, MD  Chief Complaint    Hypertension  Significant Past Medical History   CAD 6/25 CABG x 3, prior DES - LCx  HLD 6/25 TG 403, LDL not calculated; on atorvastatin  80, fenofibrate   DM2 6/25 A1c 10.7 (down from 13.3), with [facility]          No Known Allergies  History of Present Illness    Scott Donaldson is a 73 y.o. male patient of Dr Emmer, in the office today for hypertension evaluation.   Blood Pressure Goal:  130/80  Current Medications:  amlodipine  2.5 mg daily, metoprolol  succ 25 mg daily,   Family Hx:     Social Hx:      Tobacco:  Alcohol:  Caffeine: Diet:      Exercise:   Home BP readings:      Adherence Assessment  Do you ever forget to take your medication? [] Yes [] No  Do you ever skip doses due to side effects? [] Yes [] No  Do you have trouble affording your medicines? [] Yes [] No  Are you ever unable to pick up your medication due to transportation difficulties? [] Yes [] No  Do you ever stop taking your medications because you don't believe they are helping? [] Yes [] No  Do you check your weight daily? [] Yes [] No   Adherence strategy: ***  Barriers to obtaining medications: ***     Accessory Clinical Findings    Lab Results  Component Value Date   CREATININE 1.05 01/02/2024   BUN 23 01/02/2024   NA 138 01/02/2024   K 4.4 01/02/2024   CL 104 01/02/2024   CO2 24 01/02/2024   Lab Results  Component Value Date   ALT 18 10/22/2021   AST 17 10/22/2021   ALKPHOS 54 10/22/2021   BILITOT 0.6 10/22/2021   Lab Results  Component Value Date   HGBA1C 10.7 (H) 01/20/2024    Home Medications    Current Outpatient Medications  Medication Sig Dispense Refill   Accu-Chek Softclix Lancets lancets Check blood sugar 2 times daily 100 each 12   amLODipine  (NORVASC )  5 MG tablet Take 0.5 tablets (2.5 mg total) by mouth daily. 90 tablet 3   aspirin  EC 325 MG tablet Take 1 tablet (325 mg total) by mouth daily. 100 tablet 3   atorvastatin  (LIPITOR ) 80 MG tablet Take 1 tablet (80 mg total) by mouth daily. 30 tablet 0   Blood Glucose Monitoring Suppl (ACCU-CHEK GUIDE ME) w/Device KIT Check blood sugars 2 times daily 1 kit 0   dapagliflozin  propanediol (FARXIGA ) 10 MG TABS tablet Take 1 tablet (10 mg total) by mouth daily. 90 tablet 1   fenofibrate  160 MG tablet Take 1 tablet (160 mg total) by mouth daily. 30 tablet 5   furosemide  (LASIX ) 40 MG tablet Take 1 tablet (40 mg total) by mouth daily for 7 days. 7 tablet 0   glucose blood (ACCU-CHEK GUIDE) test strip Check blood sugars 2 times daily 100 each 12   glucose blood test strip 1 each by Other route 2 (two) times daily. And lancets 2/day 200 each 3   insulin  glargine (LANTUS  SOLOSTAR) 100 UNIT/ML Solostar Pen Inject 30 Units into the skin at bedtime. 15 mL 1   Insulin  Lispro-aabc (LYUMJEV  KWIKPEN) 100 UNIT/ML KwikPen Inject 8-12 Units into the skin 3 (three) times daily before meals. 6 mL  1   Insulin  Pen Needle 32G X 4 MM MISC Use 4 times daily. 400 each 3   Iron , Ferrous Sulfate , 325 (65 Fe) MG TABS Take one daily 90 tablet 4   LANTUS  SOLOSTAR 100 UNIT/ML Solostar Pen Inject 30 Units into the skin in the morning. 45 mL 1   LYUMJEV  KWIKPEN 100 UNIT/ML KwikPen Use 8-12 units with each meal. Max daily dose is 50 units. 45 mL 1   metFORMIN  (GLUCOPHAGE -XR) 500 MG 24 hr tablet Take 1 tablet (500 mg total) by mouth daily with breakfast. 180 tablet 3   metFORMIN  (GLUCOPHAGE -XR) 500 MG 24 hr tablet Take 1 tablet (500 mg total) by mouth daily with breakfast. 90 tablet 1   metoprolol  succinate (TOPROL  XL) 25 MG 24 hr tablet Take 1 tablet (25 mg total) by mouth daily. 90 tablet 3   nitroGLYCERIN  (NITROSTAT ) 0.4 MG SL tablet Place 1 tablet (0.4 mg total) under the tongue every 5 (five) minutes as needed for chest pain.  (Patient not taking: Reported on 01/20/2024) 25 tablet 3   ondansetron  (ZOFRAN -ODT) 4 MG disintegrating tablet Take 1 tablet (4 mg total) by mouth every 8 (eight) hours as needed for nausea or vomiting. (Patient not taking: Reported on 01/20/2024) 20 tablet 0   No current facility-administered medications for this visit.     No BP recorded.  {Refresh Note OR Click here to enter BP  :1}***   Assessment & Plan    No problem-specific Assessment & Plan notes found for this encounter.   Nasirah Sachs PharmD CPP [facility]  [address] [place], [place] [address] [phone]

## 2024-02-10 ENCOUNTER — Other Ambulatory Visit: Payer: Self-pay | Admitting: Physician Assistant

## 2024-02-10 ENCOUNTER — Other Ambulatory Visit: Payer: Self-pay

## 2024-02-10 ENCOUNTER — Other Ambulatory Visit (HOSPITAL_COMMUNITY): Payer: Self-pay

## 2024-02-10 DIAGNOSIS — I2510 Atherosclerotic heart disease of native coronary artery without angina pectoris: Secondary | ICD-10-CM

## 2024-02-10 DIAGNOSIS — Z951 Presence of aortocoronary bypass graft: Secondary | ICD-10-CM

## 2024-02-10 DIAGNOSIS — I10 Essential (primary) hypertension: Secondary | ICD-10-CM

## 2024-02-10 DIAGNOSIS — E1142 Type 2 diabetes mellitus with diabetic polyneuropathy: Secondary | ICD-10-CM

## 2024-02-10 MED ORDER — AMLODIPINE BESYLATE 5 MG PO TABS
2.5000 mg | ORAL_TABLET | Freq: Every day | ORAL | 3 refills | Status: AC
  Filled 2024-02-10: qty 45, 90d supply, fill #0
  Filled 2024-03-22 (×2): qty 45, 90d supply, fill #1
  Filled 2024-07-01: qty 45, 90d supply, fill #2

## 2024-02-16 ENCOUNTER — Other Ambulatory Visit: Payer: Self-pay | Admitting: Thoracic Surgery (Cardiothoracic Vascular Surgery)

## 2024-02-16 DIAGNOSIS — I214 Non-ST elevation (NSTEMI) myocardial infarction: Secondary | ICD-10-CM

## 2024-02-16 NOTE — Progress Notes (Deleted)
 Cardiology Office Note:    Date:  02/16/2024   ID:  Scott Donaldson, DOB 1951-05-30, MRN [id number]  PCP:  Scott Mliss DELENA, FNP   [facility] Providers Cardiologist:  Scott Emmer, Donaldson Cardiology APP:  Scott Jon Garre, PA { Click to update primary Donaldson,subspecialty Donaldson or APP then REFRESH:1}    Referring Donaldson: Scott Mliss DELENA, FNP   No chief complaint on file. ***  History of Present Illness:    Scott Donaldson is a 73 y.o. male with a hx of CAD with prior DES-LCx and now s/p CABG x 3 12/2023, hypertension, hyperlipidemia, BPH s/p TURP, DM.  He underwent CABG x 3 with LIMA-LAD, SVG-OM, SVG-PDA on 12/27/2023.  Echo with LVEF 55-60% with grade 1 DD, no significant valvular disease.   I saw him for posthospital follow-up 01/20/2024 when he was progressing as expected at that time.  He presents for routine cardiology follow-up.    CAD DES-LCx CABG x 3 in 12/2023 - on ASA 325 mg - continue lipitor  and BB   Hypertension - on toprol  and low dose amlodipine    Hyperlipidemia with LDL goal < 70 - continue 80 mg lipitor , fenofibrate  - referral to lipid clinic 12/26/2023: VLDL UNABLE TO CALCULATE IF TRIGLYCERIDE OVER 400 mg/dL 3/72/7974: Cholesterol, Total 155; HDL 38; LDL Chol Calc (NIH) 95; Triglycerides 123    DM with hyperglycemia A1c 13.3% Consider SGTL2i when A1c is under 10 - following with endocrinology ?        Past Medical History:  Diagnosis Date   Abdominal pain 08/04/2016   Abnormal PSA 05/19/2015   Anemia    Arthralgia 11/12/2010   Benign prostatic hyperplasia with urinary hesitancy 06/14/2018   Cataract    Coronary artery disease    nonobstructive   Diabetes (HCC) 03/04/2007   Qualifier: Diagnosis of  By: Scott Donaldson, Scott A    DIABETES MELLITUS, TYPE II 03/04/2007   Elevated LDL cholesterol level 06/14/2018   Foot ulcer, right (HCC) 04/15/2014   GERD (gastroesophageal reflux disease)    HOH (hard of hearing)    both ears no hearing aides    HYPERCHOLESTEROLEMIA 07/29/2008   HYPERTENSION 03/04/2007   Knee pain, left 05/16/2015   Nonintractable episodic headache 12/01/2016   migraine   SHOULDER PAIN, BILATERAL 01/12/2010   Qualifier: Diagnosis of  By: Scott Donaldson, Scott A    TUBULOVILLOUS ADENOMA, 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   Qualifier: Diagnosis of  By: Scott Donaldson, Scott Donaldson     Past Surgical History:  Procedure Laterality Date   CARDIAC CATHETERIZATION     COLONOSCOPY  07/2017   CORONARY ARTERY BYPASS GRAFT N/A 12/27/2023   Procedure: CORONARY ARTERY BYPASS GRAFTING (CABG) TIMES THREE USING LEFT INTERNAL MAMMARY ARTERY AND ENDOSCOPICALLY HARVESTED RIGHT GREATER SAPHENOUS VEIN;  Surgeon: Scott Linnie KIDD, Donaldson;  Location: [facility];  Service: Open Heart Surgery;  Laterality: N/A;   CORONARY PRESSURE/FFR WITH 3D MAPPING N/A 12/26/2023   Procedure: Coronary Pressure/FFR w/3D Mapping;  Surgeon: Scott Lurena POUR, Donaldson;  Location: [facility];  Service: Cardiovascular;  Laterality: N/A;   CORONARY STENT INTERVENTION N/A 10/22/2021   Procedure: CORONARY STENT INTERVENTION;  Surgeon: Scott Lurena POUR, Donaldson;  Location: [facility];  Service: Cardiovascular;  Laterality: N/A;   LEFT HEART CATH AND CORONARY ANGIOGRAPHY N/A 07/12/2018   Procedure: LEFT HEART CATH AND CORONARY ANGIOGRAPHY;  Surgeon: Scott Deatrice DELENA, Donaldson;  [facility]: [facility];  Service: Cardiovascular;  Laterality: N/A;   LEFT HEART CATH  AND CORONARY ANGIOGRAPHY N/A 10/22/2021   Procedure: LEFT HEART CATH AND CORONARY ANGIOGRAPHY;  Surgeon: Scott Lurena POUR, Donaldson;  [facility];  Service: Cardiovascular;  Laterality: N/A;   LEFT HEART CATH AND CORONARY ANGIOGRAPHY N/A 12/26/2023   Procedure: LEFT HEART CATH AND CORONARY ANGIOGRAPHY;  Surgeon: Scott Lurena POUR, Donaldson;  [facility];  Service: Cardiovascular;  Laterality: N/A;   stress cardiolite  05/30/2003   TEE WITHOUT CARDIOVERSION N/A 12/27/2023   Procedure: ECHOCARDIOGRAM, TRANSESOPHAGEAL;  Surgeon:  Scott Linnie KIDD, Donaldson;  Location: [facility];  Service: Open Heart Surgery;  Laterality: N/A;   TRANSURETHRAL RESECTION OF PROSTATE N/A 06/04/2020   Procedure: TRANSURETHRAL RESECTION OF THE PROSTATE (TURP), BIPOLAR;  Surgeon: Scott Lonni Righter, Donaldson;  Location: [facility];  Service: Urology;  Laterality: N/A;    Current Medications: No outpatient medications have been marked as taking for the 02/17/24 encounter (Appointment) with Scott Jon Garre, PA.     Allergies:   Patient has no known allergies.   Social History   Socioeconomic History   Marital status: Single    Spouse name: Not on file   Number of children: 2   Years of education: 10   Highest education level: 10th grade  Occupational History   Occupation: roofer   Occupation: Retired  Tobacco Use   Smoking status: Former    Current packs/day: 0.00    Average packs/day: 1.5 packs/day for 25.0 years (37.5 ttl pk-yrs)    Types: Cigarettes    Start date: 07/27/1963    Quit date: 07/26/1988    Years since quitting: 35.5   Smokeless tobacco: Never  Vaping Use   Vaping status: Never Used  Substance and Sexual Activity   Alcohol use: No   Drug use: No   Sexual activity: Not Currently  Other Topics Concern   Not on file  Social History Narrative   Lives with daughter in a one story home.  Has 2 children.  Semi-retired.  Works as a Designer, fashion/clothing.  Education: 10th grade.    Social Drivers of Corporate investment banker Strain: Low Risk  (11/13/2021)   Overall Financial Resource Strain (CARDIA)    Difficulty of Paying Living Expenses: Not hard at all  Food Insecurity: Low Risk  (01/05/2024)   Received from [facility]   Hunger Vital Sign    Within the past 12 months, you worried that your food would run out before you got money to buy more: Never true    Within the past 12 months, the food you bought just didn't last and you didn't have money to get more. : Never true  Recent Concern: Food Insecurity - Food  Insecurity Present (12/25/2023)   Hunger Vital Sign    Worried About Running Out of Food in the Last Year: Never true    Ran Out of Food in the Last Year: Sometimes true  Transportation Needs: No Transportation Needs (01/05/2024)   Received from [facility]    In the past 12 months, has lack of reliable transportation kept you from medical appointments, meetings, work or from getting things needed for daily living? : No  Physical Activity: Insufficiently Active (11/13/2021)   Exercise Vital Sign    Days of Exercise per Week: 2 days    Minutes of Exercise per Session: 20 min  Stress: No Stress Concern Present (11/13/2021)   [employer] of Occupational Health - Occupational Stress Questionnaire    Feeling of  Stress : Not at all  Social Connections: Moderately Isolated (12/25/2023)   Social Connection and Isolation Panel    Frequency of Communication with Friends and Family: Never    Frequency of Social Gatherings with Friends and Family: More than three times a week    Attends Religious Services: More than 4 times per year    Active Member of [employer] or Organizations: No    Attends Banker Meetings: Never    Marital Status: Widowed     Family History: The patient's ***family history includes Cancer in his father and mother; Cancer (age of onset: 28) in his brother; Diabetes in his father. There is no history of Colon cancer, Rectal cancer, or Stomach cancer.  ROS:   Please see the history of present illness.    *** All other systems reviewed and are negative.  EKGs/Labs/Other Studies Reviewed:    The following studies were reviewed today: ***      Recent Labs: 12/29/2023: Magnesium  2.5 01/02/2024: BUN 23; Creatinine, Ser 1.05; Hemoglobin 8.4; Platelets 206; Potassium 4.4; Sodium 138  Recent Lipid Panel    Component Value Date/Time   CHOL 155 01/20/2024 1031   TRIG 123 01/20/2024 1031   HDL 38 (L) 01/20/2024 1031   CHOLHDL 4.1 01/20/2024 1031    CHOLHDL 7.5 12/26/2023 0446   VLDL UNABLE TO CALCULATE IF TRIGLYCERIDE OVER 400 mg/dL 93/97/7974 9553   LDLCALC 95 01/20/2024 1031   LDLDIRECT 88 01/20/2024 1031   LDLDIRECT 186 (H) 12/26/2023 0446     Risk Assessment/Calculations:   {Does this patient have ATRIAL FIBRILLATION?:[phone]}  No BP recorded.  {Refresh Note OR Click here to enter BP  :1}***         Physical Exam:    VS:  There were no vitals taken for this visit.    Wt Readings from Last 3 Encounters:  01/20/24 173 lb 12.8 oz (78.8 kg)  01/03/24 181 lb 14.1 oz (82.5 kg)  09/15/23 177 lb 0.5 oz (80.3 kg)     GEN: *** Well nourished, well developed in no acute distress HEENT: Normal NECK: No JVD; No carotid bruits LYMPHATICS: No lymphadenopathy CARDIAC: ***RRR, no murmurs, rubs, gallops RESPIRATORY:  Clear to auscultation without rales, wheezing or rhonchi  ABDOMEN: Soft, non-tender, non-distended MUSCULOSKELETAL:  No edema; No deformity  SKIN: Warm and dry NEUROLOGIC:  Alert and oriented x 3 PSYCHIATRIC:  Normal affect   ASSESSMENT:    No diagnosis found. PLAN:    In order of problems listed above:  ***  {The patient has an active order for outpatient cardiac rehabilitation.   Please indicate if the patient is ready to start. Do NOT delete this.  It will auto delete.  Refresh note, then sign.              Click here to document readiness and see contraindications.  :1}  Cardiac Rehabilitation Eligibility Assessment  The patient is ready to start cardiac rehabilitation pending clearance from the cardiac surgeon.     {Are you ordering a CV Procedure (e.g. stress test, cath, DCCV, TEE, etc)?   Press F2        :[id number]}    Medication Adjustments/Labs and Tests Ordered: Current medicines are reviewed at length with the patient today.  Concerns regarding medicines are outlined above.  No orders of the defined types were placed in this encounter.  No orders of the defined types were placed in  this encounter.   There are no Patient Instructions on file for  this visit.   Signed, Jon Donaldson Jimena Wieczorek, PA  02/16/2024 1:20 PM    [facility]

## 2024-02-17 ENCOUNTER — Ambulatory Visit: Admitting: Pharmacist

## 2024-02-17 ENCOUNTER — Ambulatory Visit: Attending: Physician Assistant | Admitting: Physician Assistant

## 2024-02-17 NOTE — Progress Notes (Deleted)
 Office Visit    Patient Name: Scott Donaldson Date of Encounter: 02/17/2024  Primary Care Provider:  Patti Mliss DELENA, FNP Primary Cardiologist:  Maude Emmer, MD  Chief Complaint    Hypertension/HLD  Significant Past Medical History   CAD 6/25 CABG x 3, prior DES - LCx  HLD 6/25 TG 403, LDL not calculated; on atorvastatin  80, fenofibrate  repeat labs 6/27 TG 123, LDL-C 95, direct LDL 88, LP(a) 9  DM2 6/25 J8r 10.7 (down from 13.3), with [facility]          No Known Allergies  History of Present Illness    Scott Donaldson is a 73 y.o. male patient of Dr Margarito Finder , in the office today for hypertension/HLD evaluation. Hx of DM. A1C improved from 13.3 to 10.7. Amlodipine  2.5mg  daily was added 6/27.  How is BP? PCKS9i? Intolerances?  Blood Pressure Goal:  <130/80 LDL-C goal <55 Risk factors: CABG, NSTEMI in 2023, DM, age, HTN  Current HTN Medications:  amlodipine  2.5 mg daily, metoprolol  succ 25 mg daily,  Current HLD Medications: atorvastatin  80mg  daily, fenofibrate  160mg  daily  Family Hx:     Social Hx:      Tobacco:  Alcohol:  Caffeine: Diet:      Exercise:   Home BP readings:      Adherence Assessment  Do you ever forget to take your medication? [] Yes [] No  Do you ever skip doses due to side effects? [] Yes [] No  Do you have trouble affording your medicines? [] Yes [] No  Are you ever unable to pick up your medication due to transportation difficulties? [] Yes [] No  Do you ever stop taking your medications because you don't believe they are helping? [] Yes [] No  Do you check your weight daily? [] Yes [] No   Adherence strategy: ***  Barriers to obtaining medications: ***     Accessory Clinical Findings    Lab Results  Component Value Date   CREATININE 1.05 01/02/2024   BUN 23 01/02/2024   NA 138 01/02/2024   K 4.4 01/02/2024   CL 104 01/02/2024   CO2 24 01/02/2024   Lab Results  Component Value Date   ALT 18  10/22/2021   AST 17 10/22/2021   ALKPHOS 54 10/22/2021   BILITOT 0.6 10/22/2021   Lab Results  Component Value Date   HGBA1C 10.7 (H) 01/20/2024    Home Medications    Current Outpatient Medications  Medication Sig Dispense Refill   Accu-Chek Softclix Lancets lancets Check blood sugar 2 times daily 100 each 12   amLODipine  (NORVASC ) 5 MG tablet Take 1/2 tablet (2.5 mg total) by mouth daily. 90 tablet 3   aspirin  EC 325 MG tablet Take 1 tablet (325 mg total) by mouth daily. 100 tablet 3   atorvastatin  (LIPITOR ) 80 MG tablet Take 1 tablet (80 mg total) by mouth daily. 30 tablet 0   Blood Glucose Monitoring Suppl (ACCU-CHEK GUIDE ME) w/Device KIT Check blood sugars 2 times daily 1 kit 0   dapagliflozin  propanediol (FARXIGA ) 10 MG TABS tablet Take 1 tablet (10 mg total) by mouth daily. 90 tablet 1   fenofibrate  160 MG tablet Take 1 tablet (160 mg total) by mouth daily. 30 tablet 5   furosemide  (LASIX ) 40 MG tablet Take 1 tablet (40 mg total) by mouth daily for 7 days. 7 tablet 0   glucose blood (ACCU-CHEK GUIDE) test strip Check blood sugars 2 times daily 100 each 12   glucose blood test strip 1 each by  Other route 2 (two) times daily. And lancets 2/day 200 each 3   insulin  glargine (LANTUS  SOLOSTAR) 100 UNIT/ML Solostar Pen Inject 30 Units into the skin at bedtime. 15 mL 1   Insulin  Lispro-aabc (LYUMJEV  KWIKPEN) 100 UNIT/ML KwikPen Inject 8-12 Units into the skin 3 (three) times daily before meals. 6 mL 1   Insulin  Pen Needle 32G X 4 MM MISC Use 4 times daily. 400 each 3   Iron , Ferrous Sulfate , 325 (65 Fe) MG TABS Take one daily 90 tablet 4   LANTUS  SOLOSTAR 100 UNIT/ML Solostar Pen Inject 30 Units into the skin in the morning. 45 mL 1   LYUMJEV  KWIKPEN 100 UNIT/ML KwikPen Use 8-12 units with each meal. Max daily dose is 50 units. 45 mL 1   metFORMIN  (GLUCOPHAGE -XR) 500 MG 24 hr tablet Take 1 tablet (500 mg total) by mouth daily with breakfast. 180 tablet 3   metFORMIN  (GLUCOPHAGE -XR)  500 MG 24 hr tablet Take 1 tablet (500 mg total) by mouth daily with breakfast. 90 tablet 1   metoprolol  succinate (TOPROL  XL) 25 MG 24 hr tablet Take 1 tablet (25 mg total) by mouth daily. 90 tablet 3   nitroGLYCERIN  (NITROSTAT ) 0.4 MG SL tablet Place 1 tablet (0.4 mg total) under the tongue every 5 (five) minutes as needed for chest pain. (Patient not taking: Reported on 01/20/2024) 25 tablet 3   ondansetron  (ZOFRAN -ODT) 4 MG disintegrating tablet Take 1 tablet (4 mg total) by mouth every 8 (eight) hours as needed for nausea or vomiting. (Patient not taking: Reported on 01/20/2024) 20 tablet 0   No current facility-administered medications for this visit.     No BP recorded.  {Refresh Note OR Click here to enter BP  :1}***   Assessment & Plan    No problem-specific Assessment & Plan notes found for this encounter.   Domanick Cuccia D Katharina Jehle, Pharm.JONETTA SARAN, CPP [facility] A Division of  [facility] [address] [place], [place] [address]  Phone: [phone]; Fax: [phone]

## 2024-02-20 ENCOUNTER — Telehealth: Payer: Self-pay | Admitting: Cardiovascular Disease

## 2024-02-20 ENCOUNTER — Other Ambulatory Visit (HOSPITAL_COMMUNITY): Payer: Self-pay

## 2024-02-20 DIAGNOSIS — Z951 Presence of aortocoronary bypass graft: Secondary | ICD-10-CM

## 2024-02-20 DIAGNOSIS — I2510 Atherosclerotic heart disease of native coronary artery without angina pectoris: Secondary | ICD-10-CM

## 2024-02-20 DIAGNOSIS — I10 Essential (primary) hypertension: Secondary | ICD-10-CM

## 2024-02-20 DIAGNOSIS — E1142 Type 2 diabetes mellitus with diabetic polyneuropathy: Secondary | ICD-10-CM

## 2024-02-20 MED ORDER — METOPROLOL SUCCINATE ER 25 MG PO TB24
25.0000 mg | ORAL_TABLET | Freq: Every day | ORAL | 3 refills | Status: AC
  Filled 2024-02-20: qty 90, 90d supply, fill #0
  Filled 2024-05-23: qty 90, 90d supply, fill #1

## 2024-02-20 MED ORDER — ATORVASTATIN CALCIUM 80 MG PO TABS
80.0000 mg | ORAL_TABLET | Freq: Every day | ORAL | 3 refills | Status: AC
  Filled 2024-02-20: qty 90, 90d supply, fill #0
  Filled 2024-03-22 (×2): qty 90, 90d supply, fill #1

## 2024-02-20 NOTE — Telephone Encounter (Signed)
 RX sent to requested Pharmacy

## 2024-02-20 NOTE — Progress Notes (Unsigned)
 [address] [name] [address]             [phone]       HPI: [name] is a 73 year old male with medical history of hypertension, CAD, NSTEMI, chronic diastolic heart failure, type 2 diabetes, BPH, hypercholesterolemia, and bronchitis returns for routine postoperative follow-up having undergone CABG X 3.  LIMA LAD, RSVG PDA (Tgraft) OM, Endoscopic greater saphenous vein harvest on the right on 12/27/2023 with Dr. Shyrl. The patient's early postoperative recovery while in the hospital was unremarkable and he was discharged in stable condition with home PT and OT. Since hospital discharge the patient reports he has been doing very well.  He has not had any problems with his incision sites.  He reports that his pain is well-controlled and he has not used any narcotics since discharge. He has been experiencing some chest wall discomfort next to the incision.  It has been improving and does not require pain medications at this time.  He has had follow-up with cardiology, PCP and endocrinology.   Current Outpatient Medications  Medication Sig Dispense Refill   Accu-Chek Softclix Lancets lancets Check blood sugar 2 times daily 100 each 12   amLODipine  (NORVASC ) 5 MG tablet Take 1/2 tablet (2.5 mg total) by mouth daily. 90 tablet 3   aspirin  EC 325 MG tablet Take 1 tablet (325 mg total) by mouth daily. 100 tablet 3   atorvastatin  (LIPITOR ) 80 MG tablet Take 1 tablet (80 mg total) by mouth daily. 90 tablet 3   Blood Glucose Monitoring Suppl (ACCU-CHEK GUIDE ME) w/Device KIT Check blood sugars 2 times daily 1 kit 0   dapagliflozin  propanediol (FARXIGA ) 10 MG TABS tablet Take 1 tablet (10 mg total) by mouth daily. 90 tablet 1   fenofibrate  160 MG tablet Take 1 tablet (160 mg total) by mouth daily. 30 tablet 5   furosemide  (LASIX ) 40 MG tablet Take 1 tablet (40 mg total) by mouth daily for 7 days. 7 tablet 0   glucose blood (ACCU-CHEK GUIDE) test strip  Check blood sugars 2 times daily 100 each 12   glucose blood test strip 1 each by Other route 2 (two) times daily. And lancets 2/day 200 each 3   insulin  glargine (LANTUS  SOLOSTAR) 100 UNIT/ML Solostar Pen Inject 30 Units into the skin at bedtime. 15 mL 1   Insulin  Lispro-aabc (LYUMJEV  KWIKPEN) 100 UNIT/ML KwikPen Inject 8-12 Units into the skin 3 (three) times daily before meals. 6 mL 1   Insulin  Pen Needle 32G X 4 MM MISC Use 4 times daily. 400 each 3   Iron , Ferrous Sulfate , 325 (65 Fe) MG TABS Take one daily 90 tablet 4   LANTUS  SOLOSTAR 100 UNIT/ML Solostar Pen Inject 30 Units into the skin in the morning. 45 mL 1   LYUMJEV  KWIKPEN 100 UNIT/ML KwikPen Use 8-12 units with each meal. Max daily dose is 50 units. 45 mL 1   metFORMIN  (GLUCOPHAGE -XR) 500 MG 24 hr tablet Take 1 tablet (500 mg total) by mouth daily with breakfast. 180 tablet 3   metFORMIN  (GLUCOPHAGE -XR) 500 MG 24 hr tablet Take 1 tablet (500 mg total) by mouth daily with breakfast. 90 tablet 1   metoprolol  succinate (TOPROL  XL) 25 MG 24 hr tablet Take 1 tablet (25 mg total) by mouth daily. 90 tablet 3   nitroGLYCERIN  (NITROSTAT ) 0.4 MG SL tablet Place 1 tablet (0.4 mg total) under the tongue every  5 (five) minutes as needed for chest pain. (Patient not taking: Reported on 01/20/2024) 25 tablet 3   ondansetron  (ZOFRAN -ODT) 4 MG disintegrating tablet Take 1 tablet (4 mg total) by mouth every 8 (eight) hours as needed for nausea or vomiting. (Patient not taking: Reported on 01/20/2024) 20 tablet 0   No current facility-administered medications for this visit.   Vitals:   02/21/24 1510  BP: 136/70  Pulse: 85  Resp: 20  SpO2: 97%   Review of Systems  Respiratory: Negative.  Negative for cough and shortness of breath.   Cardiovascular: Negative.  Negative for chest pain and leg swelling.  Neurological: Negative.  Negative for headaches.    Physical Exam Constitutional:      Appearance: Normal appearance.  HENT:     Head:  Normocephalic and atraumatic.  Cardiovascular:     Rate and Rhythm: Normal rate and regular rhythm.     Heart sounds: Normal heart sounds, S1 normal and S2 normal.  Pulmonary:     Effort: Pulmonary effort is normal.     Breath sounds: Normal breath sounds.  Skin:    General: Skin is warm and dry.      Neurological:     General: No focal deficit present.     Mental Status: He is alert and oriented to person, place, and time.      Diagnostic Tests: CLINICAL DATA:  CABG.   EXAM: CHEST - 2 VIEW   COMPARISON:  01/02/2024.   FINDINGS: Bilateral lung fields are clear. Bilateral costophrenic angles are clear. Note is made of elevated left hemidiaphragm.   Normal cardio-mediastinal silhouette.   There are surgical staples along the heart border and sternotomy wires, status post CABG (coronary artery bypass graft).   No acute osseous abnormalities.   The soft tissues are within normal limits.   IMPRESSION: No active cardiopulmonary disease.     Electronically Signed   By: Scott Donaldson M.D.   On: 02/21/2024 15:06  Plan: We reviewed today's chest x ray. We discussed driving and he is able to start driving at this time.  First time he drives it should be a short distance in the daytime and he can slowly increase distance each day.  He would like to participate in cardiac rehab and they have reached out but lost the phone number.  Will reorder at this time.  Today is 8 weeks from original surgical date so sternal precautions were discussed.  He can slowly increase activity and exercise as tolerated.  Cardiology has patient on metoprolol  and amlodipine  for blood pressure control.  He is to continue Lipitor  for hyperlipidemia.  Endocrinology restarted Farxiga  for diabetes he is to continue with insulin  and metformin .    Patient can follow-up with [facility] as clinic as needed   Scott CHRISTELLA Rough, PA-C [facility] [phone]

## 2024-02-20 NOTE — Telephone Encounter (Signed)
*  STAT* If patient is at the pharmacy, call can be transferred to refill team.   1. Which medications need to be refilled? (please list name of each medication and dose if known)   metoprolol  succinate (TOPROL  XL) 25 MG 24 hr tablet  atorvastatin  (LIPITOR ) 80 MG tablet (Expired)   2. Would you like to learn more about the convenience, safety, & potential cost savings by using the [facility]?   3. Are you open to using the [facility] (Type [facility]. ).  4. Which pharmacy/location (including street and city if local pharmacy) is medication to be sent to?  [place] [facility]   5. Do they need a 30 day or 90 day supply?   90 day  Daughter Cecile) stated patient has been out of medication for a week.

## 2024-02-21 ENCOUNTER — Ambulatory Visit
Admission: RE | Admit: 2024-02-21 | Discharge: 2024-02-21 | Disposition: A | Discharge status: Home / Self Care | Source: Ambulatory Visit | Attending: Thoracic Surgery (Cardiothoracic Vascular Surgery) | Admitting: Thoracic Surgery (Cardiothoracic Vascular Surgery)

## 2024-02-21 ENCOUNTER — Ambulatory Visit

## 2024-02-21 ENCOUNTER — Other Ambulatory Visit: Payer: Self-pay

## 2024-02-21 VITALS — BP 136/70 | HR 85 | Resp 20 | Ht 72.0 in | Wt 179.0 lb

## 2024-02-21 DIAGNOSIS — E119 Type 2 diabetes mellitus without complications: Secondary | ICD-10-CM | POA: Diagnosis not present

## 2024-02-21 DIAGNOSIS — N400 Enlarged prostate without lower urinary tract symptoms: Secondary | ICD-10-CM | POA: Diagnosis not present

## 2024-02-21 DIAGNOSIS — I2510 Atherosclerotic heart disease of native coronary artery without angina pectoris: Secondary | ICD-10-CM | POA: Diagnosis not present

## 2024-02-21 DIAGNOSIS — I110 Hypertensive heart disease with heart failure: Secondary | ICD-10-CM | POA: Insufficient documentation

## 2024-02-21 DIAGNOSIS — I252 Old myocardial infarction: Secondary | ICD-10-CM | POA: Insufficient documentation

## 2024-02-21 DIAGNOSIS — R9389 Abnormal findings on diagnostic imaging of other specified body structures: Secondary | ICD-10-CM | POA: Diagnosis not present

## 2024-02-21 DIAGNOSIS — I5032 Chronic diastolic (congestive) heart failure: Secondary | ICD-10-CM | POA: Insufficient documentation

## 2024-02-21 DIAGNOSIS — I214 Non-ST elevation (NSTEMI) myocardial infarction: Secondary | ICD-10-CM

## 2024-02-21 DIAGNOSIS — Z7984 Long term (current) use of oral hypoglycemic drugs: Secondary | ICD-10-CM | POA: Diagnosis not present

## 2024-02-21 DIAGNOSIS — E7800 Pure hypercholesterolemia, unspecified: Secondary | ICD-10-CM | POA: Insufficient documentation

## 2024-02-21 DIAGNOSIS — Z951 Presence of aortocoronary bypass graft: Secondary | ICD-10-CM

## 2024-02-21 DIAGNOSIS — Z794 Long term (current) use of insulin: Secondary | ICD-10-CM | POA: Insufficient documentation

## 2024-02-21 NOTE — Patient Instructions (Signed)
You may continue to gradually increase your physical activity as tolerated.  Refrain from any heavy lifting or strenuous use of your arms and shoulders until at least 8 weeks from the time of your surgery, and avoid activities that cause increased pain in your chest on the side of your surgical incision.  Otherwise you may continue to increase activities without any particular limitations.  Increase the intensity and duration of physical activity gradually.   You may return to driving an automobile as long as you are no longer requiring oral narcotic pain relievers during the daytime.  It would be wise to start driving only short distances during the daylight and gradually increase from there as you feel comfortable.  

## 2024-02-27 ENCOUNTER — Telehealth (HOSPITAL_COMMUNITY): Payer: Self-pay

## 2024-02-27 NOTE — Telephone Encounter (Signed)
 Called patient to see if he was interested in participating in the Cardiac Rehab Program. Patient will come in for orientation on 08/07 and will attend the 8:15 exercise class.  Sent MyChart message.

## 2024-03-01 ENCOUNTER — Inpatient Hospital Stay (HOSPITAL_COMMUNITY): Admission: RE | Admit: 2024-03-01 | Source: Ambulatory Visit

## 2024-03-01 ENCOUNTER — Other Ambulatory Visit (HOSPITAL_COMMUNITY): Payer: Self-pay

## 2024-03-01 ENCOUNTER — Telehealth (HOSPITAL_COMMUNITY): Payer: Self-pay

## 2024-03-01 NOTE — Telephone Encounter (Signed)
 Called pt regarding 1045 appt for CRP2 orientation. Spoke with daughter Jaclyn. Family is out of town for emergency and forgot to call to cancel/reschedule appt. Will pass pt info back to schedulers to reschedule for another time.   Con KATHEE Pereyra, MS, ACSM-CEP 03/01/2024 11:00 AM

## 2024-03-07 ENCOUNTER — Ambulatory Visit (HOSPITAL_COMMUNITY)

## 2024-03-09 ENCOUNTER — Ambulatory Visit (HOSPITAL_COMMUNITY)

## 2024-03-12 ENCOUNTER — Ambulatory Visit (HOSPITAL_COMMUNITY)

## 2024-03-12 ENCOUNTER — Telehealth (HOSPITAL_COMMUNITY): Payer: Self-pay

## 2024-03-12 ENCOUNTER — Other Ambulatory Visit (HOSPITAL_COMMUNITY): Payer: Self-pay

## 2024-03-12 ENCOUNTER — Encounter (HOSPITAL_COMMUNITY): Payer: Self-pay

## 2024-03-12 NOTE — Telephone Encounter (Signed)
 Attempted to call patient to reschedule cardiac rehab- no answer, unable to leave message.   Sent letter.

## 2024-03-14 ENCOUNTER — Ambulatory Visit (HOSPITAL_COMMUNITY)

## 2024-03-16 ENCOUNTER — Ambulatory Visit (HOSPITAL_COMMUNITY)

## 2024-03-19 ENCOUNTER — Ambulatory Visit (HOSPITAL_COMMUNITY)

## 2024-03-21 ENCOUNTER — Ambulatory Visit (HOSPITAL_COMMUNITY)

## 2024-03-22 ENCOUNTER — Other Ambulatory Visit (HOSPITAL_COMMUNITY): Payer: Self-pay

## 2024-03-23 ENCOUNTER — Ambulatory Visit (HOSPITAL_COMMUNITY)

## 2024-03-28 ENCOUNTER — Ambulatory Visit (HOSPITAL_COMMUNITY)

## 2024-03-29 ENCOUNTER — Telehealth (HOSPITAL_COMMUNITY): Payer: Self-pay

## 2024-03-29 NOTE — Telephone Encounter (Signed)
 Called patient to schedule cardiac rehab, spoke to patient's daughter but she said she no longer handles his medical appts. She gave me the number for her brother, Scott Donaldson. Scott Donaldson, received verbal permission from patient to speak to him; they stated that Scott will now be handling [name] appts as his sister is no longer in town.   Confirmed with patient that he is not interested in cardiac rehab, advised patient and his son to get a new Release of Information form signed to add him to patient's chart.  Closing cardiac rehab referral.

## 2024-03-30 ENCOUNTER — Ambulatory Visit (HOSPITAL_COMMUNITY)

## 2024-04-02 ENCOUNTER — Ambulatory Visit (HOSPITAL_COMMUNITY)

## 2024-04-04 ENCOUNTER — Ambulatory Visit (HOSPITAL_COMMUNITY)

## 2024-04-06 ENCOUNTER — Ambulatory Visit (HOSPITAL_COMMUNITY)

## 2024-04-09 ENCOUNTER — Ambulatory Visit (HOSPITAL_COMMUNITY)

## 2024-04-11 ENCOUNTER — Ambulatory Visit (HOSPITAL_COMMUNITY)

## 2024-04-12 ENCOUNTER — Other Ambulatory Visit: Payer: Self-pay

## 2024-04-12 DIAGNOSIS — I110 Hypertensive heart disease with heart failure: Secondary | ICD-10-CM | POA: Diagnosis not present

## 2024-04-12 DIAGNOSIS — E7800 Pure hypercholesterolemia, unspecified: Secondary | ICD-10-CM | POA: Diagnosis not present

## 2024-04-12 DIAGNOSIS — I5032 Chronic diastolic (congestive) heart failure: Secondary | ICD-10-CM | POA: Diagnosis not present

## 2024-04-12 DIAGNOSIS — E1142 Type 2 diabetes mellitus with diabetic polyneuropathy: Secondary | ICD-10-CM | POA: Diagnosis not present

## 2024-04-12 DIAGNOSIS — Z794 Long term (current) use of insulin: Secondary | ICD-10-CM | POA: Diagnosis not present

## 2024-04-12 MED ORDER — INSULIN PEN NEEDLE 32G X 4 MM MISC: 3 refills | Status: AC

## 2024-04-12 MED ORDER — LANTUS SOLOSTAR 100 UNIT/ML ~~LOC~~ SOPN: 30.0000 [IU] | PEN_INJECTOR | Freq: Every morning | SUBCUTANEOUS | 1 refills | Status: AC

## 2024-04-12 MED ORDER — METFORMIN HCL ER 500 MG PO TB24: 500.0000 mg | ORAL_TABLET | Freq: Two times a day (BID) | ORAL | 1 refills | Status: AC

## 2024-04-12 MED ORDER — LYUMJEV KWIKPEN 100 UNIT/ML ~~LOC~~ SOPN: 8.0000 [IU] | PEN_INJECTOR | Freq: Three times a day (TID) | SUBCUTANEOUS | 1 refills | Status: AC

## 2024-04-13 ENCOUNTER — Ambulatory Visit (HOSPITAL_COMMUNITY)

## 2024-04-16 ENCOUNTER — Ambulatory Visit (HOSPITAL_COMMUNITY)

## 2024-04-18 ENCOUNTER — Ambulatory Visit (HOSPITAL_COMMUNITY)

## 2024-04-20 ENCOUNTER — Ambulatory Visit (HOSPITAL_COMMUNITY)

## 2024-04-20 ENCOUNTER — Other Ambulatory Visit (HOSPITAL_COMMUNITY): Payer: Self-pay

## 2024-04-23 ENCOUNTER — Ambulatory Visit (HOSPITAL_COMMUNITY)

## 2024-04-25 ENCOUNTER — Ambulatory Visit (HOSPITAL_COMMUNITY)

## 2024-04-27 ENCOUNTER — Ambulatory Visit (HOSPITAL_COMMUNITY)

## 2024-04-30 ENCOUNTER — Ambulatory Visit (HOSPITAL_COMMUNITY)

## 2024-05-02 ENCOUNTER — Ambulatory Visit (HOSPITAL_COMMUNITY)

## 2024-05-04 ENCOUNTER — Ambulatory Visit (HOSPITAL_COMMUNITY)

## 2024-05-07 ENCOUNTER — Ambulatory Visit (HOSPITAL_COMMUNITY)

## 2024-05-09 ENCOUNTER — Ambulatory Visit (HOSPITAL_COMMUNITY)

## 2024-05-11 ENCOUNTER — Ambulatory Visit (HOSPITAL_COMMUNITY)

## 2024-05-14 ENCOUNTER — Ambulatory Visit (HOSPITAL_COMMUNITY)

## 2024-05-16 ENCOUNTER — Ambulatory Visit (HOSPITAL_COMMUNITY)

## 2024-05-18 ENCOUNTER — Ambulatory Visit (HOSPITAL_COMMUNITY)

## 2024-05-21 ENCOUNTER — Ambulatory Visit (HOSPITAL_COMMUNITY)

## 2024-05-23 ENCOUNTER — Ambulatory Visit (HOSPITAL_COMMUNITY)

## 2024-05-23 ENCOUNTER — Other Ambulatory Visit (HOSPITAL_COMMUNITY): Payer: Self-pay

## 2024-05-23 ENCOUNTER — Other Ambulatory Visit: Payer: Self-pay

## 2024-05-25 ENCOUNTER — Ambulatory Visit (HOSPITAL_COMMUNITY)

## 2024-07-06 ENCOUNTER — Other Ambulatory Visit (HOSPITAL_COMMUNITY): Payer: Self-pay

## 2024-07-09 ENCOUNTER — Other Ambulatory Visit: Payer: Self-pay
